# Patient Record
Sex: Female | Born: 1955 | Race: Black or African American | Hispanic: No | Marital: Single | Smoking: Former smoker
Health system: Southern US, Community
[De-identification: ages and names within clinical notes are randomized; demographics above are authoritative.]

## PROBLEM LIST (undated history)

## (undated) ENCOUNTER — Emergency Department (HOSPITAL_BASED_OUTPATIENT_CLINIC_OR_DEPARTMENT_OTHER): Payer: Medicare Other

## (undated) ENCOUNTER — Emergency Department (HOSPITAL_COMMUNITY): Admission: EM | Payer: Medicare Other | Source: Home / Self Care

## (undated) DIAGNOSIS — Z951 Presence of aortocoronary bypass graft: Secondary | ICD-10-CM

## (undated) DIAGNOSIS — E7800 Pure hypercholesterolemia, unspecified: Secondary | ICD-10-CM

## (undated) DIAGNOSIS — I2510 Atherosclerotic heart disease of native coronary artery without angina pectoris: Secondary | ICD-10-CM

## (undated) DIAGNOSIS — I509 Heart failure, unspecified: Secondary | ICD-10-CM

## (undated) DIAGNOSIS — F1020 Alcohol dependence, uncomplicated: Secondary | ICD-10-CM

## (undated) DIAGNOSIS — I2089 Other forms of angina pectoris: Secondary | ICD-10-CM

## (undated) DIAGNOSIS — C801 Malignant (primary) neoplasm, unspecified: Secondary | ICD-10-CM

## (undated) DIAGNOSIS — I208 Other forms of angina pectoris: Secondary | ICD-10-CM

## (undated) DIAGNOSIS — I739 Peripheral vascular disease, unspecified: Secondary | ICD-10-CM

## (undated) DIAGNOSIS — T7840XA Allergy, unspecified, initial encounter: Secondary | ICD-10-CM

## (undated) DIAGNOSIS — F32A Depression, unspecified: Secondary | ICD-10-CM

## (undated) DIAGNOSIS — I6930 Unspecified sequelae of cerebral infarction: Secondary | ICD-10-CM

## (undated) DIAGNOSIS — D649 Anemia, unspecified: Secondary | ICD-10-CM

## (undated) DIAGNOSIS — C50912 Malignant neoplasm of unspecified site of left female breast: Secondary | ICD-10-CM

## (undated) DIAGNOSIS — T4145XA Adverse effect of unspecified anesthetic, initial encounter: Secondary | ICD-10-CM

## (undated) DIAGNOSIS — Z8619 Personal history of other infectious and parasitic diseases: Secondary | ICD-10-CM

## (undated) DIAGNOSIS — I10 Essential (primary) hypertension: Secondary | ICD-10-CM

## (undated) DIAGNOSIS — F419 Anxiety disorder, unspecified: Secondary | ICD-10-CM

## (undated) DIAGNOSIS — E0590 Thyrotoxicosis, unspecified without thyrotoxic crisis or storm: Secondary | ICD-10-CM

## (undated) DIAGNOSIS — Z801 Family history of malignant neoplasm of trachea, bronchus and lung: Secondary | ICD-10-CM

## (undated) DIAGNOSIS — I340 Nonrheumatic mitral (valve) insufficiency: Secondary | ICD-10-CM

## (undated) DIAGNOSIS — R112 Nausea with vomiting, unspecified: Secondary | ICD-10-CM

## (undated) DIAGNOSIS — E785 Hyperlipidemia, unspecified: Secondary | ICD-10-CM

## (undated) DIAGNOSIS — I6529 Occlusion and stenosis of unspecified carotid artery: Secondary | ICD-10-CM

## (undated) DIAGNOSIS — F329 Major depressive disorder, single episode, unspecified: Secondary | ICD-10-CM

## (undated) DIAGNOSIS — I639 Cerebral infarction, unspecified: Secondary | ICD-10-CM

## (undated) DIAGNOSIS — Z9889 Other specified postprocedural states: Secondary | ICD-10-CM

## (undated) DIAGNOSIS — T8859XA Other complications of anesthesia, initial encounter: Secondary | ICD-10-CM

## (undated) DIAGNOSIS — Z9289 Personal history of other medical treatment: Secondary | ICD-10-CM

## (undated) HISTORY — PX: BREAST LUMPECTOMY: SHX2

## (undated) HISTORY — DX: Depression, unspecified: F32.A

## (undated) HISTORY — DX: Peripheral vascular disease, unspecified: I73.9

## (undated) HISTORY — PX: ABDOMINAL HYSTERECTOMY: SHX81

## (undated) HISTORY — PX: CORONARY ARTERY BYPASS GRAFT: SHX141

## (undated) HISTORY — DX: Major depressive disorder, single episode, unspecified: F32.9

## (undated) HISTORY — DX: Thyrotoxicosis, unspecified without thyrotoxic crisis or storm: E05.90

## (undated) HISTORY — DX: Personal history of other medical treatment: Z92.89

## (undated) HISTORY — DX: Atherosclerotic heart disease of native coronary artery without angina pectoris: I25.10

## (undated) HISTORY — DX: Allergy, unspecified, initial encounter: T78.40XA

## (undated) HISTORY — DX: Occlusion and stenosis of unspecified carotid artery: I65.29

## (undated) HISTORY — DX: Family history of malignant neoplasm of trachea, bronchus and lung: Z80.1

## (undated) HISTORY — PX: CARDIAC CATHETERIZATION: SHX172

## (undated) HISTORY — PX: CAROTID ENDARTERECTOMY: SUR193

## (undated) HISTORY — DX: Alcohol dependence, uncomplicated: F10.20

## (undated) HISTORY — DX: Anemia, unspecified: D64.9

## (undated) HISTORY — DX: Essential (primary) hypertension: I10

## (undated) HISTORY — DX: Pure hypercholesterolemia, unspecified: E78.00

---
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
## 1996-09-11 HISTORY — PX: ABDOMINAL HYSTERECTOMY: SHX81

## 1997-09-11 HISTORY — PX: BREAST LUMPECTOMY WITH AXILLARY LYMPH NODE DISSECTION: SHX5756

## 1997-09-11 HISTORY — PX: BREAST LUMPECTOMY: SHX2

## 1998-02-11 ENCOUNTER — Ambulatory Visit (HOSPITAL_COMMUNITY): Admission: RE | Admit: 1998-02-11 | Discharge: 1998-02-11 | Payer: Self-pay | Admitting: Family Medicine

## 1998-03-26 ENCOUNTER — Emergency Department (HOSPITAL_COMMUNITY): Admission: EM | Admit: 1998-03-26 | Discharge: 1998-03-26 | Payer: Self-pay | Admitting: Endocrinology

## 1998-08-27 ENCOUNTER — Ambulatory Visit (HOSPITAL_COMMUNITY): Admission: RE | Admit: 1998-08-27 | Discharge: 1998-08-27 | Payer: Self-pay | Admitting: General Surgery

## 1998-09-08 ENCOUNTER — Ambulatory Visit (HOSPITAL_COMMUNITY): Admission: RE | Admit: 1998-09-08 | Discharge: 1998-09-09 | Payer: Self-pay | Admitting: General Surgery

## 1998-09-08 ENCOUNTER — Encounter: Payer: Self-pay | Admitting: General Surgery

## 1998-09-13 ENCOUNTER — Encounter: Admission: RE | Admit: 1998-09-13 | Discharge: 1998-12-12 | Payer: Self-pay | Admitting: Radiation Oncology

## 1998-10-07 ENCOUNTER — Encounter: Payer: Self-pay | Admitting: Oncology

## 1998-10-07 ENCOUNTER — Ambulatory Visit (HOSPITAL_COMMUNITY): Admission: RE | Admit: 1998-10-07 | Discharge: 1998-10-07 | Payer: Self-pay | Admitting: Oncology

## 1999-01-11 ENCOUNTER — Encounter: Admission: RE | Admit: 1999-01-11 | Discharge: 1999-04-11 | Payer: Self-pay | Admitting: Radiation Oncology

## 1999-04-04 ENCOUNTER — Emergency Department (HOSPITAL_COMMUNITY): Admission: EM | Admit: 1999-04-04 | Discharge: 1999-04-04 | Payer: Self-pay | Admitting: Emergency Medicine

## 1999-04-04 ENCOUNTER — Encounter: Payer: Self-pay | Admitting: Emergency Medicine

## 1999-05-10 ENCOUNTER — Other Ambulatory Visit: Admission: RE | Admit: 1999-05-10 | Discharge: 1999-05-10 | Payer: Self-pay | Admitting: Family Medicine

## 1999-09-12 DIAGNOSIS — I2510 Atherosclerotic heart disease of native coronary artery without angina pectoris: Secondary | ICD-10-CM

## 1999-09-12 DIAGNOSIS — Z8619 Personal history of other infectious and parasitic diseases: Secondary | ICD-10-CM

## 1999-09-12 HISTORY — PX: CORONARY ARTERY BYPASS GRAFT: SHX141

## 1999-09-12 HISTORY — DX: Personal history of other infectious and parasitic diseases: Z86.19

## 1999-09-12 HISTORY — DX: Atherosclerotic heart disease of native coronary artery without angina pectoris: I25.10

## 2000-04-11 HISTORY — PX: CAROTID ENDARTERECTOMY: SUR193

## 2000-04-23 ENCOUNTER — Encounter: Payer: Self-pay | Admitting: Emergency Medicine

## 2000-04-23 ENCOUNTER — Inpatient Hospital Stay (HOSPITAL_COMMUNITY): Admission: EM | Admit: 2000-04-23 | Discharge: 2000-04-29 | Payer: Self-pay | Admitting: Emergency Medicine

## 2000-04-24 ENCOUNTER — Encounter: Payer: Self-pay | Admitting: *Deleted

## 2000-04-27 ENCOUNTER — Encounter (INDEPENDENT_AMBULATORY_CARE_PROVIDER_SITE_OTHER): Payer: Self-pay | Admitting: Specialist

## 2000-06-06 ENCOUNTER — Encounter (INDEPENDENT_AMBULATORY_CARE_PROVIDER_SITE_OTHER): Payer: Self-pay | Admitting: *Deleted

## 2000-06-06 ENCOUNTER — Ambulatory Visit (HOSPITAL_COMMUNITY): Admission: RE | Admit: 2000-06-06 | Discharge: 2000-06-06 | Payer: Self-pay | Admitting: Gastroenterology

## 2000-06-11 ENCOUNTER — Encounter: Payer: Self-pay | Admitting: Cardiothoracic Surgery

## 2000-06-11 HISTORY — PX: CARDIAC CATHETERIZATION: SHX172

## 2000-06-12 ENCOUNTER — Encounter: Payer: Self-pay | Admitting: Cardiothoracic Surgery

## 2000-06-12 ENCOUNTER — Inpatient Hospital Stay (HOSPITAL_COMMUNITY): Admission: RE | Admit: 2000-06-12 | Discharge: 2000-06-17 | Payer: Self-pay | Admitting: Cardiology

## 2000-06-13 ENCOUNTER — Encounter: Payer: Self-pay | Admitting: Cardiothoracic Surgery

## 2000-06-14 ENCOUNTER — Encounter: Payer: Self-pay | Admitting: Cardiothoracic Surgery

## 2000-07-24 ENCOUNTER — Encounter (HOSPITAL_COMMUNITY): Admission: RE | Admit: 2000-07-24 | Discharge: 2000-10-22 | Payer: Self-pay | Admitting: Cardiology

## 2000-08-30 ENCOUNTER — Encounter: Admission: RE | Admit: 2000-08-30 | Discharge: 2000-08-30 | Payer: Self-pay | Admitting: Gastroenterology

## 2000-08-30 ENCOUNTER — Encounter: Payer: Self-pay | Admitting: Gastroenterology

## 2000-09-11 DIAGNOSIS — I639 Cerebral infarction, unspecified: Secondary | ICD-10-CM

## 2000-09-11 HISTORY — DX: Cerebral infarction, unspecified: I63.9

## 2000-10-23 ENCOUNTER — Encounter (HOSPITAL_COMMUNITY): Admission: RE | Admit: 2000-10-23 | Discharge: 2000-10-28 | Payer: Self-pay | Admitting: Cardiology

## 2001-03-07 ENCOUNTER — Other Ambulatory Visit: Admission: RE | Admit: 2001-03-07 | Discharge: 2001-03-07 | Payer: Self-pay | Admitting: Obstetrics and Gynecology

## 2002-01-03 ENCOUNTER — Encounter: Payer: Self-pay | Admitting: Family Medicine

## 2002-01-03 ENCOUNTER — Encounter: Admission: RE | Admit: 2002-01-03 | Discharge: 2002-01-03 | Payer: Self-pay | Admitting: Family Medicine

## 2002-01-12 ENCOUNTER — Emergency Department (HOSPITAL_COMMUNITY): Admission: EM | Admit: 2002-01-12 | Discharge: 2002-01-13 | Payer: Self-pay | Admitting: Emergency Medicine

## 2002-01-12 ENCOUNTER — Encounter: Payer: Self-pay | Admitting: Emergency Medicine

## 2002-02-06 ENCOUNTER — Ambulatory Visit (HOSPITAL_COMMUNITY): Admission: RE | Admit: 2002-02-06 | Discharge: 2002-02-06 | Payer: Self-pay | Admitting: Neurology

## 2002-07-30 ENCOUNTER — Other Ambulatory Visit: Admission: RE | Admit: 2002-07-30 | Discharge: 2002-07-30 | Payer: Self-pay | Admitting: Family Medicine

## 2003-11-28 ENCOUNTER — Emergency Department (HOSPITAL_COMMUNITY): Admission: EM | Admit: 2003-11-28 | Discharge: 2003-11-29 | Payer: Self-pay | Admitting: Emergency Medicine

## 2004-06-23 ENCOUNTER — Encounter: Admission: RE | Admit: 2004-06-23 | Discharge: 2004-06-23 | Payer: Self-pay | Admitting: Cardiology

## 2004-09-28 ENCOUNTER — Ambulatory Visit: Payer: Self-pay | Admitting: Cardiology

## 2004-12-29 ENCOUNTER — Ambulatory Visit: Payer: Self-pay | Admitting: Oncology

## 2005-06-26 ENCOUNTER — Encounter (INDEPENDENT_AMBULATORY_CARE_PROVIDER_SITE_OTHER): Payer: Self-pay | Admitting: *Deleted

## 2005-06-26 ENCOUNTER — Encounter: Admission: RE | Admit: 2005-06-26 | Discharge: 2005-06-26 | Payer: Self-pay | Admitting: General Surgery

## 2005-12-16 ENCOUNTER — Emergency Department (HOSPITAL_COMMUNITY): Admission: EM | Admit: 2005-12-16 | Discharge: 2005-12-16 | Payer: Self-pay | Admitting: Emergency Medicine

## 2005-12-28 ENCOUNTER — Ambulatory Visit: Payer: Self-pay | Admitting: Oncology

## 2006-05-09 ENCOUNTER — Ambulatory Visit: Payer: Self-pay | Admitting: Cardiology

## 2006-05-30 ENCOUNTER — Ambulatory Visit: Payer: Self-pay

## 2006-05-30 ENCOUNTER — Encounter: Payer: Self-pay | Admitting: Cardiology

## 2006-06-14 ENCOUNTER — Ambulatory Visit: Payer: Self-pay | Admitting: Cardiology

## 2006-06-28 ENCOUNTER — Ambulatory Visit: Payer: Self-pay | Admitting: Cardiology

## 2006-06-28 ENCOUNTER — Ambulatory Visit: Payer: Self-pay

## 2007-01-29 ENCOUNTER — Ambulatory Visit: Payer: Self-pay | Admitting: Oncology

## 2007-06-19 ENCOUNTER — Ambulatory Visit: Payer: Self-pay

## 2007-08-06 ENCOUNTER — Ambulatory Visit: Payer: Self-pay | Admitting: Cardiology

## 2008-01-24 ENCOUNTER — Ambulatory Visit: Payer: Self-pay | Admitting: Oncology

## 2008-03-10 ENCOUNTER — Ambulatory Visit: Payer: Self-pay | Admitting: Oncology

## 2008-07-02 ENCOUNTER — Ambulatory Visit: Payer: Self-pay | Admitting: Cardiovascular Disease

## 2008-08-05 ENCOUNTER — Ambulatory Visit: Payer: Self-pay | Admitting: Cardiology

## 2008-08-20 ENCOUNTER — Ambulatory Visit: Payer: Self-pay | Admitting: Cardiology

## 2008-08-20 LAB — CONVERTED CEMR LAB
BUN: 13 mg/dL (ref 6–23)
CO2: 27 meq/L (ref 19–32)
Calcium: 9.2 mg/dL (ref 8.4–10.5)
Chloride: 105 meq/L (ref 96–112)
Creatinine, Ser: 0.6 mg/dL (ref 0.4–1.2)
GFR calc Af Amer: 134 mL/min
GFR calc non Af Amer: 111 mL/min
Glucose, Bld: 83 mg/dL (ref 70–99)
Potassium: 3.8 meq/L (ref 3.5–5.1)
Sodium: 138 meq/L (ref 135–145)

## 2008-09-18 ENCOUNTER — Ambulatory Visit: Payer: Self-pay | Admitting: Cardiology

## 2008-10-05 ENCOUNTER — Ambulatory Visit: Payer: Self-pay | Admitting: Cardiology

## 2008-10-05 LAB — CONVERTED CEMR LAB
ALT: 20 units/L (ref 0–35)
AST: 23 units/L (ref 0–37)
Albumin: 4 g/dL (ref 3.5–5.2)
Alkaline Phosphatase: 58 units/L (ref 39–117)
BUN: 17 mg/dL (ref 6–23)
Bilirubin, Direct: 0.1 mg/dL (ref 0.0–0.3)
CO2: 29 meq/L (ref 19–32)
Calcium: 9 mg/dL (ref 8.4–10.5)
Chloride: 103 meq/L (ref 96–112)
Cholesterol: 205 mg/dL (ref 0–200)
Creatinine, Ser: 0.7 mg/dL (ref 0.4–1.2)
Direct LDL: 141 mg/dL
GFR calc Af Amer: 112 mL/min
GFR calc non Af Amer: 93 mL/min
Glucose, Bld: 99 mg/dL (ref 70–99)
HDL: 47.2 mg/dL (ref >39.0)
Potassium: 3.8 meq/L (ref 3.5–5.1)
Sodium: 138 meq/L (ref 135–145)
Total Bilirubin: 1.1 mg/dL (ref 0.3–1.2)
Total CHOL/HDL Ratio: 4.3
Total Protein: 7.6 g/dL (ref 6.0–8.3)
Triglycerides: 51 mg/dL (ref 0–149)
VLDL: 10 mg/dL (ref 0–40)

## 2008-11-03 ENCOUNTER — Ambulatory Visit: Payer: Self-pay | Admitting: Cardiology

## 2008-12-29 DIAGNOSIS — I2510 Atherosclerotic heart disease of native coronary artery without angina pectoris: Secondary | ICD-10-CM | POA: Insufficient documentation

## 2008-12-29 DIAGNOSIS — E0590 Thyrotoxicosis, unspecified without thyrotoxic crisis or storm: Secondary | ICD-10-CM | POA: Insufficient documentation

## 2008-12-29 DIAGNOSIS — E7800 Pure hypercholesterolemia, unspecified: Secondary | ICD-10-CM | POA: Insufficient documentation

## 2008-12-29 DIAGNOSIS — F419 Anxiety disorder, unspecified: Secondary | ICD-10-CM | POA: Insufficient documentation

## 2008-12-29 DIAGNOSIS — I10 Essential (primary) hypertension: Secondary | ICD-10-CM | POA: Insufficient documentation

## 2008-12-29 DIAGNOSIS — I6529 Occlusion and stenosis of unspecified carotid artery: Secondary | ICD-10-CM | POA: Insufficient documentation

## 2008-12-30 ENCOUNTER — Encounter: Payer: Self-pay | Admitting: Cardiology

## 2008-12-30 ENCOUNTER — Ambulatory Visit: Payer: Self-pay | Admitting: Cardiology

## 2009-01-11 ENCOUNTER — Encounter: Payer: Self-pay | Admitting: Cardiology

## 2009-03-09 ENCOUNTER — Ambulatory Visit: Payer: Self-pay | Admitting: Oncology

## 2009-05-12 ENCOUNTER — Encounter: Payer: Self-pay | Admitting: Cardiology

## 2009-05-13 ENCOUNTER — Telehealth: Payer: Self-pay | Admitting: Cardiology

## 2009-05-13 ENCOUNTER — Ambulatory Visit: Payer: Self-pay | Admitting: Oncology

## 2009-05-18 ENCOUNTER — Encounter: Payer: Self-pay | Admitting: Cardiology

## 2009-06-15 ENCOUNTER — Ambulatory Visit: Payer: Self-pay | Admitting: Cardiology

## 2009-06-24 ENCOUNTER — Ambulatory Visit: Payer: Self-pay

## 2009-06-24 ENCOUNTER — Encounter: Payer: Self-pay | Admitting: Cardiology

## 2009-07-01 ENCOUNTER — Ambulatory Visit: Payer: Self-pay | Admitting: Cardiology

## 2009-07-01 ENCOUNTER — Ambulatory Visit: Payer: Self-pay

## 2009-07-01 ENCOUNTER — Encounter: Payer: Self-pay | Admitting: Cardiology

## 2009-07-01 ENCOUNTER — Ambulatory Visit (HOSPITAL_COMMUNITY): Admission: RE | Admit: 2009-07-01 | Discharge: 2009-07-01 | Payer: Self-pay | Admitting: Cardiology

## 2009-07-05 ENCOUNTER — Encounter: Payer: Self-pay | Admitting: Cardiology

## 2009-07-05 LAB — CONVERTED CEMR LAB
ALT: 27 units/L (ref 0–35)
AST: 33 units/L (ref 0–37)
Albumin: 4.4 g/dL (ref 3.5–5.2)
Alkaline Phosphatase: 50 units/L (ref 39–117)
BUN: 18 mg/dL (ref 6–23)
BUN: 13 mg/dL (ref 6–23)
Bilirubin, Direct: 0 mg/dL (ref 0.0–0.3)
CO2: 30 meq/L (ref 19–32)
CO2: 31 meq/L (ref 19–32)
Calcium: 9.9 mg/dL (ref 8.4–10.5)
Calcium: 10.2 mg/dL (ref 8.4–10.5)
Chloride: 103 meq/L (ref 96–112)
Chloride: 102 meq/L (ref 96–112)
Cholesterol: 213 mg/dL — ABNORMAL HIGH (ref 0–200)
Creatinine, Ser: 0.8 mg/dL (ref 0.4–1.2)
Creatinine, Ser: 0.7 mg/dL (ref 0.4–1.2)
Direct LDL: 161.7 mg/dL
GFR calc non Af Amer: 95.62 mL/min (ref >60)
GFR calc non Af Amer: 111.54 mL/min (ref >60)
Glucose, Bld: 89 mg/dL (ref 70–99)
Glucose, Bld: 102 mg/dL — ABNORMAL HIGH (ref 70–99)
HDL: 47.5 mg/dL (ref >39.00)
Potassium: 4 meq/L (ref 3.5–5.1)
Potassium: 4.3 meq/L (ref 3.5–5.1)
Sodium: 138 meq/L (ref 135–145)
Sodium: 141 meq/L (ref 135–145)
Total Bilirubin: 0.8 mg/dL (ref 0.3–1.2)
Total CHOL/HDL Ratio: 4
Total Protein: 8 g/dL (ref 6.0–8.3)
Triglycerides: 82 mg/dL (ref 0.0–149.0)
VLDL: 16.4 mg/dL (ref 0.0–40.0)

## 2009-08-11 ENCOUNTER — Ambulatory Visit: Payer: Self-pay | Admitting: Cardiology

## 2010-03-04 ENCOUNTER — Ambulatory Visit: Payer: Self-pay | Admitting: Cardiology

## 2010-03-07 LAB — CONVERTED CEMR LAB
BUN: 11 mg/dL (ref 6–23)
CO2: 20 meq/L (ref 19–32)
Calcium: 9.2 mg/dL (ref 8.4–10.5)
Chloride: 104 meq/L (ref 96–112)
Creatinine, Ser: 0.66 mg/dL (ref 0.40–1.20)
Glucose, Bld: 57 mg/dL — ABNORMAL LOW (ref 70–99)
Potassium: 3.5 meq/L (ref 3.5–5.3)
Sodium: 139 meq/L (ref 135–145)

## 2010-05-12 ENCOUNTER — Ambulatory Visit: Payer: Self-pay | Admitting: Oncology

## 2010-05-12 ENCOUNTER — Encounter: Payer: Self-pay | Admitting: Cardiology

## 2010-05-30 ENCOUNTER — Encounter (INDEPENDENT_AMBULATORY_CARE_PROVIDER_SITE_OTHER): Payer: Self-pay | Admitting: *Deleted

## 2010-06-13 ENCOUNTER — Ambulatory Visit: Payer: Self-pay | Admitting: Oncology

## 2010-06-21 ENCOUNTER — Ambulatory Visit: Payer: Self-pay | Admitting: Cardiology

## 2010-06-29 ENCOUNTER — Telehealth: Payer: Self-pay | Admitting: Cardiology

## 2010-08-01 ENCOUNTER — Ambulatory Visit: Payer: Self-pay

## 2010-08-01 ENCOUNTER — Encounter: Payer: Self-pay | Admitting: Cardiology

## 2010-08-12 ENCOUNTER — Telehealth: Payer: Self-pay | Admitting: Cardiology

## 2010-08-26 ENCOUNTER — Ambulatory Visit: Payer: Self-pay | Admitting: Cardiology

## 2010-09-09 ENCOUNTER — Ambulatory Visit: Payer: Self-pay | Admitting: Cardiology

## 2010-09-19 ENCOUNTER — Ambulatory Visit (HOSPITAL_COMMUNITY)
Admission: RE | Admit: 2010-09-19 | Discharge: 2010-09-19 | Payer: Self-pay | Source: Home / Self Care | Attending: Gastroenterology | Admitting: Gastroenterology

## 2010-09-22 ENCOUNTER — Encounter: Payer: Self-pay | Admitting: Cardiology

## 2010-09-22 ENCOUNTER — Ambulatory Visit: Admission: RE | Admit: 2010-09-22 | Discharge: 2010-09-22 | Payer: Self-pay | Source: Home / Self Care

## 2010-09-26 ENCOUNTER — Telehealth: Payer: Self-pay | Admitting: Cardiology

## 2010-09-28 ENCOUNTER — Telehealth (INDEPENDENT_AMBULATORY_CARE_PROVIDER_SITE_OTHER): Payer: Self-pay | Admitting: *Deleted

## 2010-10-05 ENCOUNTER — Other Ambulatory Visit: Payer: Self-pay | Admitting: Cardiology

## 2010-10-05 LAB — BASIC METABOLIC PANEL
BUN: 14 mg/dL (ref 6–23)
CO2: 29 mEq/L (ref 19–32)
Calcium: 9.2 mg/dL (ref 8.4–10.5)
Chloride: 103 mEq/L (ref 96–112)
Creatinine, Ser: 0.8 mg/dL (ref 0.4–1.2)
GFR: 100.97 mL/min (ref >60.00)
Glucose, Bld: 89 mg/dL (ref 70–99)
Potassium: 3.6 mEq/L (ref 3.5–5.1)
Sodium: 141 mEq/L (ref 135–145)

## 2010-10-13 NOTE — Assessment & Plan Note (Signed)
Summary: 2 MONTH ROV   Primary Provider:  Ursula Beath   History of Present Illness: Pt has been feeling ok.  She has been taking Lexapro and anxiety medications. She has been keeping her granddaughter.  She has her two or three days per week.  Denies chest pain, despite working out.    Current Medications (verified): 1)  Aspirin 81 Mg Tbec (Aspirin) .... Take One Tablet By Mouth Daily 2)  Enalapril-Hydrochlorothiazide 5-12.5 Mg Tabs (Enalapril-Hydrochlorothiazide) .... Take One Tablet By Mouth Twice A Day 3)  Lexapro 10 Mg Tabs (Escitalopram Oxalate) .... Take 1 Tablet By Mouth Once A Day- 4)  Nitroglycerin 0.4 Mg Subl (Nitroglycerin) .... One Tablet Under Tongue Every 5 Minutes As Needed For Chest Pain---May Repeat Times Three 5)  Crestor 10 Mg Tabs (Rosuvastatin Calcium) .... Take One Tablet By Mouth Daily. 6)  Clonazepam Odt 0.5 Mg Tbdp (Clonazepam) .... As Needed 7)  One Daily  Tabs (Multiple Vitamin) .... Take 1 Tablet By Mouth Once A Day 8)  Metoprolol Succinate 50 Mg Xr24h-Tab (Metoprolol Succinate) .... Take One Tablet By Mouth Daily  Allergies (verified): No Known Drug Allergies  Vital Signs:  Patient profile:   55 year old female Height:      64 inches Weight:      167 pounds BMI:     28.77 Pulse rate:   73 / minute BP sitting:   142 / 84  (left arm) Cuff size:   regular  Vitals Entered By: Hardin Negus, RMA (August 11, 2009 1:39 PM)  Physical Exam  General:  Well developed, well nourished, in no acute distress. Neck:  Carotid scar.  No bruit Lungs:  Clear bilaterally to auscultation and percussion. Heart:  PMI non displaced.  Normal S1 and S2.  Prominent S4 gallop. Abdomen:  Bowel sounds positive; abdomen soft and non-tender without masses, organomegaly, or hernias noted. No hepatosplenomegaly. Extremities:  No clubbing or cyanosis. Neurologic:  Alert and oriented x 3.   EKG  Procedure date:  08/11/2009  Findings:      LVH with repolarization  abnormality  Impression & Recommendations:  Problem # 1:  CAD (ICD-414.00) stable.  Not having any issues at present. Her updated medication list for this problem includes:    Aspirin 81 Mg Tbec (Aspirin) .Marland Kitchen... Take one tablet by mouth daily    Enalapril-hydrochlorothiazide 5-12.5 Mg Tabs (Enalapril-hydrochlorothiazide) .Marland Kitchen... Take one tablet by mouth twice a day    Nitroglycerin 0.4 Mg Subl (Nitroglycerin) ..... One tablet under tongue every 5 minutes as needed for chest pain---may repeat times three    Metoprolol Succinate 50 Mg Xr24h-tab (Metoprolol succinate) .Marland Kitchen... Take one tablet by mouth daily  Orders: EKG w/ Interpretation (93000)  Problem # 2:  HYPERCHOLESTEROLEMIA (ICD-272.0) Stable. Follow up with Dr. Evlyn Kanner. Her updated medication list for this problem includes:    Crestor 10 Mg Tabs (Rosuvastatin calcium) .Marland Kitchen... Take one tablet by mouth daily.  Orders: EKG w/ Interpretation (93000)  Problem # 3:  HYPERTENSION (ICD-401.9) Stable, question of meds in past.  BP better although higher this am..   ECG consistent with LVH. Her updated medication list for this problem includes:    Aspirin 81 Mg Tbec (Aspirin) .Marland Kitchen... Take one tablet by mouth daily    Enalapril-hydrochlorothiazide 5-12.5 Mg Tabs (Enalapril-hydrochlorothiazide) .Marland Kitchen... Take one tablet by mouth twice a day    Metoprolol Succinate 50 Mg Xr24h-tab (Metoprolol succinate) .Marland Kitchen... Take one tablet by mouth daily  Orders: EKG w/ Interpretation (93000)  Problem # 4:  CAROTID ARTERY STENOSIS (ICD-433.10) Recent studies done.  Excellent pulses in l hand. Her updated medication list for this problem includes:    Aspirin 81 Mg Tbec (Aspirin) .Marland Kitchen... Take one tablet by mouth daily  Patient Instructions: 1)  Your physician recommends that you continue on your current medications as directed. Please refer to the Current Medication list given to you today. 2)  Your physician wants you to follow-up in:   4 MONTHS. You will receive a  reminder letter in the mail two months in advance. If you don't receive a letter, please call our office to schedule the follow-up appointment.

## 2010-10-13 NOTE — Miscellaneous (Signed)
Summary: Orders Update  Clinical Lists Changes  Medications: Added new medication of METOPROLOL SUCCINATE 50 MG XR24H-TAB (METOPROLOL SUCCINATE) Take one tablet by mouth daily - Signed Rx of METOPROLOL SUCCINATE 50 MG XR24H-TAB (METOPROLOL SUCCINATE) Take one tablet by mouth daily;  #30 x 10;  Signed;  Entered by: Ledon Snare, RN;  Authorized by: Ronaldo Miyamoto, MD, [facility];  Method used: Electronically to [facility] [address], [place], [place], [place]  [address], Ph: [phone], Fax: [phone] Orders: Added new Test order of Carotid Duplex (Carotid Duplex) - Signed Added new Test order of TLB-BMP (Basic Metabolic Panel-BMET) (80048-METABOL) - Signed    Prescriptions: METOPROLOL SUCCINATE 50 MG XR24H-TAB (METOPROLOL SUCCINATE) Take one tablet by mouth daily  #30 x 10   Entered by:   Ledon Snare, RN   Authorized by:   Ronaldo Miyamoto, MD, [facility]   Signed by:   Ledon Snare, RN on 07/01/2009   Method used:   Electronically to        [facility] [address] (retail)       [address]       [place], [place]  [address]       Ph: [phone]       Fax: [phone]   RxID:   [id number]

## 2010-10-13 NOTE — Assessment & Plan Note (Signed)
Summary: ROV   Visit Type:  rov Primary Provider:  Ursula Beath  CC:  no cardiac complaints today.  History of Present Illness: SHe is doing well.  Her daughter is doing well.  Allowing her daughter to handle her own problems.   NO chest pain.  Does often not take evening medication.  Current Medications (verified): 1)  Aspirin 81 Mg Tbec (Aspirin) .... Take One Tablet By Mouth Daily 2)  Enalapril-Hydrochlorothiazide 5-12.5 Mg Tabs (Enalapril-Hydrochlorothiazide) .... Take One Tablet By Mouth Twice A Day 3)  Lexapro 10 Mg Tabs (Escitalopram Oxalate) .... Take 1 Tablet By Mouth Once A Day- 4)  Nitroglycerin 0.4 Mg Subl (Nitroglycerin) .... One Tablet Under Tongue Every 5 Minutes As Needed For Chest Pain---May Repeat Times Three 5)  Crestor 10 Mg Tabs (Rosuvastatin Calcium) .... Take One Tablet By Mouth Daily. 6)  Clonazepam Odt 0.5 Mg Tbdp (Clonazepam) .... As Needed 7)  One Daily  Tabs (Multiple Vitamin) .... Take 1 Tablet By Mouth Once A Day 8)  Metoprolol Succinate 50 Mg Xr24h-Tab (Metoprolol Succinate) .... Take One Tablet By Mouth Daily  Allergies (verified): No Known Drug Allergies  Vital Signs:  Patient profile:   55 year old female Height:      64 inches Weight:      172 pounds BMI:     29.63 Pulse rate:   76 / minute Pulse rhythm:   irregular BP sitting:   160 / 90  (right arm) Cuff size:   large  Vitals Entered By: Danielle Rankin, CMA (March 04, 2010 3:29 PM)  Physical Exam  General:  Well developed, well nourished, in no acute distress. Head:  normocephalic and atraumatic Eyes:  PERRLA/EOM intact; conjunctiva and lids normal. Lungs:  Clear bilaterally to auscultation and percussion. Heart:  PMI non displaced. Normal S1 and S2.  S4.  No murmur. Pulses:  pulses normal in all 4 extremities Extremities:  No clubbing or cyanosis. Neurologic:  Alert and oriented x 3.   EKG  Procedure date:  03/04/2010  Findings:      NSR.  LVH with repole changes.    Impression & Recommendations:  Problem # 1:  CAD (ICD-414.00)  stable and doing well.  No chest pain.  Her updated medication list for this problem includes:    Aspirin 81 Mg Tbec (Aspirin) .Marland Kitchen... Take one tablet by mouth daily    Lisinopril 20 Mg Tabs (Lisinopril) .Marland Kitchen... Take one tablet by mouth daily    Nitroglycerin 0.4 Mg Subl (Nitroglycerin) ..... One tablet under tongue every 5 minutes as needed for chest pain---may repeat times three    Metoprolol Succinate 50 Mg Xr24h-tab (Metoprolol succinate) .Marland Kitchen... Take one tablet by mouth daily  Her updated medication list for this problem includes:    Aspirin 81 Mg Tbec (Aspirin) .Marland Kitchen... Take one tablet by mouth daily    Enalapril-hydrochlorothiazide 5-12.5 Mg Tabs (Enalapril-hydrochlorothiazide) .Marland Kitchen... Take one tablet by mouth twice a day    Nitroglycerin 0.4 Mg Subl (Nitroglycerin) ..... One tablet under tongue every 5 minutes as needed for chest pain---may repeat times three    Metoprolol Succinate 50 Mg Xr24h-tab (Metoprolol succinate) .Marland Kitchen... Take one tablet by mouth daily  Orders: T-Basic Metabolic Panel [id number])  Problem # 2:  HYPERCHOLESTEROLEMIA (ICD-272.0) Needs lipid and liver in October. Her updated medication list for this problem includes:    Crestor 10 Mg Tabs (Rosuvastatin calcium) .Marland Kitchen... Take one tablet by mouth daily.  Problem # 3:  HYPERTENSION (ICD-401.9) not well controlled.  Does not  take evening medications. Her updated medication list for this problem includes:    Aspirin 81 Mg Tbec (Aspirin) .Marland Kitchen... Take one tablet by mouth daily    Lisinopril 20 Mg Tabs (Lisinopril) .Marland Kitchen... Take one tablet by mouth daily    Metoprolol Succinate 50 Mg Xr24h-tab (Metoprolol succinate) .Marland Kitchen... Take one tablet by mouth daily    Hydrochlorothiazide 12.5 Mg Tabs (Hydrochlorothiazide) .Marland Kitchen... Take one tablet by mouth daily.  Problem # 4:  HYPERTHYROIDISM (ICD-242.90) followed by Dr. Evlyn Kanner. Her updated medication list for this problem  includes:    Metoprolol Succinate 50 Mg Xr24h-tab (Metoprolol succinate) .Marland Kitchen... Take one tablet by mouth daily  Orders: T-Basic Metabolic Panel [id number])  Patient Instructions: 1)  Your physician recommends that you schedule a follow-up appointment in: 1 MONTH 2)  Your physician recommends that you return for lab work in: 1 WEEK (BMP 414.04, 401.9, 272.0) 3)  Your physician has recommended you make the following change in your medication: STOP  Enalapril HCTZ, START Lisinopril  20mg  once a day, START HCTZ 12.5mg  once a day Prescriptions: HYDROCHLOROTHIAZIDE 12.5 MG TABS (HYDROCHLOROTHIAZIDE) Take one tablet by mouth daily.  #30 x 3   Entered by:   Julieta Gutting, RN, BSN   Authorized by:   Ronaldo Miyamoto, MD, [facility]   Signed by:   Julieta Gutting, RN, BSN on 03/04/2010   Method used:   Electronically to        [facility] (retail)       [address]       [place], [place]  [address]       Ph: [phone]       Fax: [phone]   RxID:   [id number] LISINOPRIL 20 MG TABS (LISINOPRIL) Take one tablet by mouth daily  #30 x 3   Entered by:   Julieta Gutting, RN, BSN   Authorized by:   Ronaldo Miyamoto, MD, [facility]   Signed by:   Julieta Gutting, RN, BSN on 03/04/2010   Method used:   Electronically to        [facility] (retail)       [address]       [place], [place]  [address]       Ph: [phone]       Fax: [phone]   RxID:   [id number] CRESTOR 10 MG TABS (ROSUVASTATIN CALCIUM) Take one tablet by mouth daily.  #90 x 3   Entered by:   Danielle Rankin, CMA   Authorized by:   Ronaldo Miyamoto, MD, [facility]   Signed by:   Danielle Rankin, CMA on 03/04/2010   Method used:   Electronically to        [facility] [address] (retail)       [address]       [place], [place]  [address]       Ph: [phone]       Fax: [phone]   RxID:   [id number] ENALAPRIL-HYDROCHLOROTHIAZIDE 5-12.5  MG TABS (ENALAPRIL-HYDROCHLOROTHIAZIDE) take one tablet by mouth twice a day  #90 x 3   Entered by:   Danielle Rankin, CMA   Authorized by:   Ronaldo Miyamoto, MD, [facility]   Signed by:   Danielle Rankin, CMA on 03/04/2010   Method used:   [facility]ronically to  [facility]       [address]       [place], [place]  [address]       Ph: [phone]       Fax: [phone]   RxID:   [id number]  I called [facility] pharmacy and left a message to discontinue the order for Enalapril HCTZ. Julieta Gutting, RN, BSN  March 04, 2010 5:09 PM

## 2010-10-13 NOTE — Progress Notes (Signed)
Summary: results, need BMP  Phone Note Call from Patient   Caller: Patient [phone] Reason for Call: Talk to Nurse Summary of Call: pt has nurse questions-pls advise  Initial call taken by: Glynda Jaeger,  September 26, 2010 10:14 AM  Follow-up for Phone Call        Left message for pt to call back.  This pt needs to have BMP checked. Julieta Gutting, RN, BSN  September 26, 2010 11:13 AM  Left message for pt to call back.  Also make pt aware of renal duplex results and need for BMP.  Julieta Gutting, RN, BSN  September 29, 2010 3:21 PM  I spoke with the pt and made her aware of renal duplex results. I also made her aware that she needs to have BMP checked due to medication change.  The pt said she would call back and reschedule labs due to a bad connection on phone. The pt said her BP has been a little better since the medication change (SBP 170 consistently).  I told her that we need lab results before we can make further adjustments in her medication.  Follow-up by: Julieta Gutting, RN, BSN,  September 30, 2010 3:58 PM

## 2010-10-13 NOTE — Progress Notes (Signed)
Summary: BP Elevated  Phone Note Call from Patient   Caller: Patient Summary of Call: I spoke with the pt because she missed her appt with Dr Riley Kill on 08/10/10.  The pt has rescheduled to 08/26/10. I asked the pt is she is taking her medications as directed and she said yes.  The pt's BP remains elevated and has been running so high that the cuff pops off her arm.  Most of her BP readings have been above 180. I recommended that the pt increase HCTZ to 25mg  daily for the next week and continue monitoring her BP.  The pt agreed to call me next week about the BP readings.  Initial call taken by: Julieta Gutting, RN, BSN,  August 12, 2010 4:52 PM  Follow-up for Phone Call        This pt has a scheduled appt with Dr Riley Kill on 08/26/10. I left a message for the pt to call the office about her BP readings.  Julieta Gutting, RN, BSN  August 23, 2010 3:23 PM

## 2010-10-13 NOTE — Miscellaneous (Signed)
Summary: Orders Update  Clinical Lists Changes  Orders: Added new Test order of TLB-BMP (Basic Metabolic Panel-BMET) (80048-METABOL) - Signed 

## 2010-10-13 NOTE — Assessment & Plan Note (Signed)
Summary:  Cardiology  Medications Added LEXAPRO 10 MG TABS (ESCITALOPRAM OXALATE) Take 1 tablet by mouth once a day- Not taking right now. Can't afford PRAVASTATIN SODIUM 40 MG TABS (PRAVASTATIN SODIUM) Take 1 tablet by mouth once a day CLONAZEPAM ODT 0.5 MG TBDP (CLONAZEPAM) as needed      Allergies Added: NKDA  Visit Type:  Follow-up  CC:  No complains.  History of Present Illness: Doing better.  Doing more to take care of herself.  Daughter is twenty and baby is three.  No chest pain.  Stopped taking Lexapro because could not afford.  Patient stopped thyroid medication.  Has been retesting with stroke followup.  Short term memory loss.  Current Medications (verified): 1)  Aspirin 81 Mg Tbec (Aspirin) .... Take One Tablet By Mouth Daily 2)  Enalapril-Hydrochlorothiazide 5-12.5 Mg Tabs (Enalapril-Hydrochlorothiazide) .... Take 1 Tablet in Am, and 1/2 Tablet Pm 3)  Lexapro 10 Mg Tabs (Escitalopram Oxalate) .... Take 1 Tablet By Mouth Once A Day- Not Taking Right Now. Can't Afford 4)  Nitroglycerin 0.4 Mg Subl (Nitroglycerin) .... One Tablet Under Tongue Every 5 Minutes As Needed For Chest Pain---May Repeat Times Three 5)  Pravastatin Sodium 40 Mg Tabs (Pravastatin Sodium) .... Take 1 Tablet By Mouth Once A Day 6)  Clonazepam Odt 0.5 Mg Tbdp (Clonazepam) .... As Needed  Allergies (verified): No Known Drug Allergies  Vital Signs:  Patient profile:   55 year old female Height:      64 inches Weight:      166.75 pounds BMI:     28.73 Pulse rate:   77 / minute Pulse rhythm:   regular Resp:     18 per minute BP sitting:   134 / 90  (right arm) Cuff size:   large  Vitals Entered By: Vikki Ports (December 30, 2008 11:12 AM)  Physical Exam  General:  Well developed, well nourished, in no acute distress. Lungs:  Clear bilaterally to auscultation and percussion. Heart:  Non-displaced PMI, chest non-tender; regular rate and rhythm, S1, S2 without murmurs, rubs or gallops.  Carotid upstroke normal, no bruit. Normal abdominal aortic size, no bruits. Femorals normal pulses, no bruits. Pedals normal pulses. No edema, no varicosities.   EKG  Procedure date:  12/30/2008  Findings:      NSR.  LVH  Impression & Recommendations:  Problem # 1:  CAD (ICD-414.00) Stable Her updated medication list for this problem includes:    Aspirin 81 Mg Tbec (Aspirin) .Marland Kitchen... Take one tablet by mouth daily    Enalapril-hydrochlorothiazide 5-12.5 Mg Tabs (Enalapril-hydrochlorothiazide) .Marland Kitchen... Take 1 tablet in am, and 1/2 tablet pm    Nitroglycerin 0.4 Mg Subl (Nitroglycerin) ..... One tablet under tongue every 5 minutes as needed for chest pain---may repeat times three  Problem # 2:  HYPERCHOLESTEROLEMIA (ICD-272.0) Remains on pravastatin.  Got off track before lipid was checked and since restarted Pravastatin. The following medications were removed from the medication list:    Pravastatin Sodium 40 Mg Tabs (Pravastatin sodium) .Marland Kitchen... Take 1 tablet by mouth once a day Her updated medication list for this problem includes:    Pravastatin Sodium 40 Mg Tabs (Pravastatin sodium) .Marland Kitchen... Take 1 tablet by mouth once a day  Problem # 3:  DEPRESSION (ICD-311) Ran out of Lexapro, could not get refilled.  Problem # 4:  HYPERTHYROIDISM (ICD-242.90) Stopped her Tapazole.  Forgot to take.  Problem # 5:  CAROTID ARTERY STENOSIS (ICD-433.10) Needs followup in October of this year.  Has one lesion  40-59%.  1 year followup recommended Her updated medication list for this problem includes:    Aspirin 81 Mg Tbec (Aspirin) .Marland Kitchen... Take one tablet by mouth daily  Patient Instructions: 1)  Your physician wants you to follow-up in:   6 months. You will receive a reminder letter in the mail two months in advance. If you don't receive a letter, please call our office to schedule the follow-up appointment. 2)  Your physician recommends that you continue on your current medications as directed. Please  refer to the Current Medication list given to you today.

## 2010-10-13 NOTE — Assessment & Plan Note (Signed)
Summary: f6w   Visit Type:  Follow-up Primary Provider:  Ursula Beath  CC:  BP issues.  History of Present Illness: Patient is stable.  She exercises on a regular basis.  She feels good.  She misses medication occasionally, either running out of funds, or sometimes forgetting.  However, when she called she had been taking regularly.  There is broad variability in her pressures.   She does not have cough with the medication.   Problems Prior to Update: 1)  Cad  (ICD-414.00) 2)  Hypercholesterolemia  (ICD-272.0) 3)  Hypertension  (ICD-401.9) 4)  Hyperthyroidism  (ICD-242.90) 5)  Depression  (ICD-311) 6)  Carotid Artery Stenosis  (ICD-433.10)  Current Medications (verified): 1)  Aspirin 81 Mg Tbec (Aspirin) .... Take One Tablet By Mouth Daily 2)  Lisinopril 20 Mg Tabs (Lisinopril) .... Take One Tablet By Mouth Daily 3)  Lexapro 10 Mg Tabs (Escitalopram Oxalate) .... Take 1 1/2  Tablet By Mouth Once A Day- 4)  Nitroglycerin 0.4 Mg Subl (Nitroglycerin) .... One Tablet Under Tongue Every 5 Minutes As Needed For Chest Pain---May Repeat Times Three 5)  Crestor 10 Mg Tabs (Rosuvastatin Calcium) .... Take One Tablet By Mouth Daily. 6)  Clonazepam Odt 0.5 Mg Tbdp (Clonazepam) .... As Needed 7)  One Daily  Tabs (Multiple Vitamin) .... Take 1 Tablet By Mouth Once A Day 8)  Metoprolol Succinate 50 Mg Xr24h-Tab (Metoprolol Succinate) .... Take One Tablet By Mouth Daily 9)  Hydrochlorothiazide 12.5 Mg Tabs (Hydrochlorothiazide) .... Take  2  Tablets By Mouth Daily 10)  Methimazole 10 Mg Tabs (Methimazole) .... Take 1 Tablet By Mouth Once A Day  Allergies (verified): No Known Drug Allergies  Vital Signs:  Patient profile:   55 year old female Height:      64 inches Weight:      169.50 pounds BMI:     29.20 Pulse rate:   72 / minute Pulse rhythm:   regular Resp:     18 per minute BP sitting:   180 / 80  (right arm) Cuff size:   large  Vitals Entered By: Vikki Ports (August 26, 2010 4:07 PM)  Physical Exam  General:  Well developed, well nourished, in no acute distress. Head:  normocephalic and atraumatic Lungs:  Clear bilaterally to auscultation and percussion. Heart:  Non-displaced PMI, chest non-tender; regular rate and rhythm, S1, S2 without murmurs, rubs or gallops.  Abdomen:  No abdominal bruits noted.   Msk:  Back normal, normal gait. Muscle strength and tone normal. Pulses:  pulses normal in all 4 extremities Extremities:  No clubbing or cyanosis. Neurologic:  Alert and oriented x 3.   Impression & Recommendations:  Problem # 1:  HYPERTENSION (ICD-401.9) Difficult issue at this point.  Will increase ACE dose, and follow closely.  She is to call with BP.  Also, have talked about need to consistently take medication, not miss, costs, etc.  Will recheck BMET, and recheck again in 2 weeks.  Do renal duplex.  Check BPS and return in six weeks.  Importance of BP monitoring stressed.  She often stops when she runs out of medications.   Her updated medication list for this problem includes:    Aspirin 81 Mg Tbec (Aspirin) .Marland Kitchen... Take one tablet by mouth daily    Lisinopril 40 Mg Tabs (Lisinopril) .Marland Kitchen... Take one tablet by mouth daily    Metoprolol Succinate 50 Mg Xr24h-tab (Metoprolol succinate) .Marland Kitchen... Take one tablet by mouth daily    Hydrochlorothiazide 25  Mg Tabs (Hydrochlorothiazide) .Marland Kitchen... Take one tablet by mouth daily.  Problem # 2:  CAD (ICD-414.00)  stable Her updated medication list for this problem includes:    Aspirin 81 Mg Tbec (Aspirin) .Marland Kitchen... Take one tablet by mouth daily    Lisinopril 40 Mg Tabs (Lisinopril) .Marland Kitchen... Take one tablet by mouth daily    Nitroglycerin 0.4 Mg Subl (Nitroglycerin) ..... One tablet under tongue every 5 minutes as needed for chest pain---may repeat times three    Metoprolol Succinate 50 Mg Xr24h-tab (Metoprolol succinate) .Marland Kitchen... Take one tablet by mouth daily  Orders: Renal Artery Duplex (Renal Artery Duplex)  Her  updated medication list for this problem includes:    Aspirin 81 Mg Tbec (Aspirin) .Marland Kitchen... Take one tablet by mouth daily    Lisinopril 40 Mg Tabs (Lisinopril) .Marland Kitchen... Take one tablet by mouth daily    Nitroglycerin 0.4 Mg Subl (Nitroglycerin) ..... One tablet under tongue every 5 minutes as needed for chest pain---may repeat times three    Metoprolol Succinate 50 Mg Xr24h-tab (Metoprolol succinate) .Marland Kitchen... Take one tablet by mouth daily  Problem # 3:  HYPERCHOLESTEROLEMIA (ICD-272.0)  under treatment. Her updated medication list for this problem includes:    Crestor 10 Mg Tabs (Rosuvastatin calcium) .Marland Kitchen... Take one tablet by mouth daily.  Her updated medication list for this problem includes:    Crestor 10 Mg Tabs (Rosuvastatin calcium) .Marland Kitchen... Take one tablet by mouth daily.  Patient Instructions: 1)  Your physician has recommended you make the following change in your medication: INCREASE Lisinopril to 40mg  daily 2)  Your physician has requested that you have a renal artery duplex. During this test, an ultrasound is used to evaluate blood flow to the kidneys. Allow one hour for this exam. Do not eat after midnight the day before and avoid carbonated beverages. Take your medications as you usually do. 3)  Your physician has requested that you regularly monitor and record your blood pressure readings at home.  Please use the same machine at the same time of day to check your readings and record them to bring to your follow-up visit. 4)  Your physician recommends that you schedule a follow-up appointment in: 6 WEEKS 5)  Your physician recommends that you return for lab work in: 2 WEEKS (BMP 401.9, 414.00) Prescriptions: HYDROCHLOROTHIAZIDE 25 MG TABS (HYDROCHLOROTHIAZIDE) Take one tablet by mouth daily.  #30 x 6   Entered by:   Julieta Gutting, RN, BSN   Authorized by:   Ronaldo Miyamoto, MD, [facility]   Signed by:   Julieta Gutting, RN, BSN on 08/26/2010   Method used:   Electronically to        [facility] (retail)       [address]       [place], [place]  [address]       Ph: [phone]       Fax: [phone]   RxID:   [id number] LISINOPRIL 40 MG TABS (LISINOPRIL) Take one tablet by mouth daily  #30 x 6   Entered by:   Julieta Gutting, RN, BSN   Authorized by:   Ronaldo Miyamoto, MD, [facility]   Signed by:   Julieta Gutting, RN, BSN on 08/26/2010   Method used:   Electronically to        [facility] (retail)       [address]       [place]  [place], [place]  [address]       Ph: [phone]       Fax: [phone]   RxID:   [id number]

## 2010-10-13 NOTE — Progress Notes (Signed)
Summary: rtn call to obtain results   Phone Note Call from Patient Call back at Home Phone [phone]   Reason for Call: Talk to Nurse, Lab or Test Results Summary of Call: rtn call to obtain results Initial call taken by: Omer Jack,  May 13, 2009 9:36 AM  Follow-up for Phone Call        Spoke with pt. states it was a lost call on her phone. The only Md she has is Dr. Riley Kill. Pt states has not had tests done in the office recently.  The only test in her chart are from [facility] done in may/10. Results given. Pt understood it could have been a wrong number. Follow-up by: Ollen Gross, RN, BSN,  May 13, 2009 10:32 AM

## 2010-10-13 NOTE — Letter (Signed)
Summary: [facility]  [facility]   Imported By: Roderic Ovens 06/11/2009 14:04:18  _____________________________________________________________________  External Attachment:    Type:   Image     Comment:   External Document

## 2010-10-13 NOTE — Progress Notes (Signed)
Summary: refill request  Phone Note Refill Request Message from:  Patient on September 28, 2010 1:26 PM  Refills Requested: Medication #1:  LEXAPRO 10 MG TABS Take 1 1/2  tablet by mouth once a day- pt never had rx filled -needs a new rx called in-[facility]   Method Requested: Telephone to Pharmacy Initial call taken by: Glynda Jaeger,  September 28, 2010 1:28 PM  Follow-up for Phone Call        Medication needs to be refill per primary care Md. Patient notified. Vikki Ports  September 29, 2010 12:52 PM

## 2010-10-13 NOTE — Letter (Signed)
Summary: Appointment - Missed   [facility]  [address]   [place], [place] [address]   Phone: [phone]  Fax: [phone]     May 30, 2010 MRN: [id number]   [facility] [address] [place], [place]  [address]   Dear Ms. Switala,  Our records indicate you missed your appointment on 04/15/2010 at 09:30am with Dr. Riley Kill. It is very important that we reach you to reschedule this appointment. We look forward to participating in your health care needs. Please contact us at the number listed above at your earliest convenience to reschedule this appointment.     Sincerely, Neurosurgeon Team LG

## 2010-10-13 NOTE — Assessment & Plan Note (Signed)
Summary: ROV   Visit Type:  Follow-up Primary Provider:  Ursula Beath  CC:  No cariac complaints.  History of Present Illness: Patient is doing well.  She just left her daughter with some mess going on, and she thinks that impacted her BP.  Her daughter is in and out, but their relationship is much calmer.  She thinks she is do to go to see Dr. Evlyn Kanner to get her thyroids checked.  She did the women's 5k.  She checks her BP on occasion, and they tend to stay a little on the high side.  She thinks some of it is that she forgets to take her meds, and she feels rushed with making sense of her life.  She has not been using a box, and getting them off the shelf.  We talked about all types of tips to help her memory.  She also says her level of compliance on scale of 1 to 10 is 5.      Current Medications (verified): 1)  Aspirin 81 Mg Tbec (Aspirin) .... Take One Tablet By Mouth Daily 2)  Lisinopril 20 Mg Tabs (Lisinopril) .... Take One Tablet By Mouth Daily 3)  Lexapro 10 Mg Tabs (Escitalopram Oxalate) .... Take 1 1/2  Tablet By Mouth Once A Day- 4)  Nitroglycerin 0.4 Mg Subl (Nitroglycerin) .... One Tablet Under Tongue Every 5 Minutes As Needed For Chest Pain---May Repeat Times Three 5)  Crestor 10 Mg Tabs (Rosuvastatin Calcium) .... Take One Tablet By Mouth Daily. 6)  Clonazepam Odt 0.5 Mg Tbdp (Clonazepam) .... As Needed 7)  One Daily  Tabs (Multiple Vitamin) .... Take 1 Tablet By Mouth Once A Day 8)  Metoprolol Succinate 50 Mg Xr24h-Tab (Metoprolol Succinate) .... Take One Tablet By Mouth Daily 9)  Hydrochlorothiazide 12.5 Mg Tabs (Hydrochlorothiazide) .... Take One Tablet By Mouth Daily. 10)  Methimazole 10 Mg Tabs (Methimazole) .... Take 1 Tablet By Mouth Once A Day  Allergies (verified): No Known Drug Allergies  Past History:  Past Medical History: Last updated: 01/10/09   CAD (ICD-414.00)-- coronary artery bypass graft, 16-1096 Dr. Donata Clay. HYPERCHOLESTEROLEMIA  (ICD-272.0) HYPERTENSION (ICD-401.9) HYPERTHYROIDISM (ICD-242.90) DEPRESSION (ICD-311) CAROTID ARTERY STENOSIS (ICD-433.10)  Past Surgical History: Last updated: 01-10-09  Coronary artery bypass grafting x 5 (left internal mammary artery to left anterior descending coronary artery- 2001.  Left breast cancer treated with lumpectomy, chemotherapy and radiation in 1999.  left carotid endarterectomy, April 27, 2000.    Family History: Last updated: 2009/01/10 The patients mother died at an early age of carcinoma of the lung.  There is no history of premature coronary artery disease in her family.  Social History: Last updated: 10-Jan-2009 The patient is a IT trainer, Child psychotherapist, and received her college degree.  She is a single mother, and her daughter is 53 years old .  She quit smoking in August  2003,  and does not use alcohol significantly.  Vital Signs:  Patient profile:   55 year old female Height:      64 inches Weight:      171.50 pounds BMI:     29.54 Pulse rate:   71 / minute Pulse rhythm:   regular Resp:     18 per minute BP sitting:   180 / 100 Cuff size:   large  Vitals Entered By: Vikki Ports (June 21, 2010 2:33 PM)  Physical Exam  General:  Well developed, well nourished, in no acute distress. Head:  normocephalic and atraumatic Eyes:  PERRLA/EOM intact;  conjunctiva and lids normal. Lungs:  Clear bilaterally to auscultation and percussion. Heart:  PMI non displaced.  Normal S1 and S2.  Pos S4.  No murmur.  Pulses:  pulses normal in all 4 extremities Extremities:  No clubbing or cyanosis. Neurologic:  Alert and oriented x 3.   EKG  Procedure date:  06/21/2010  Findings:      NSR.  Biatrial enlargement.  LVH with repole changes.   Impression & Recommendations:  Problem # 1:  CAD (ICD-414.00)  No chest pain.  Stable post CABG.  Continue medication. Her updated medication list for this problem includes:    Aspirin 81 Mg Tbec (Aspirin)  .Marland Kitchen... Take one tablet by mouth daily    Lisinopril 20 Mg Tabs (Lisinopril) .Marland Kitchen... Take one tablet by mouth daily    Nitroglycerin 0.4 Mg Subl (Nitroglycerin) ..... One tablet under tongue every 5 minutes as needed for chest pain---may repeat times three    Metoprolol Succinate 50 Mg Xr24h-tab (Metoprolol succinate) .Marland Kitchen... Take one tablet by mouth daily  Orders: EKG w/ Interpretation (93[address]) Carotid Duplex (Carotid Duplex)  Problem # 2:  HYPERTENSION (ICD-401.9) poorly controlled, but likely as much compliance failure as much as medication failure. Instructed patient on tips for memory, asked her to make a list of medications, use a drug box, and placed reminders around the house.  Record daily and return to me in six weeks so we can make adjustments.   Her updated medication list for this problem includes:    Aspirin 81 Mg Tbec (Aspirin) .Marland Kitchen... Take one tablet by mouth daily    Lisinopril 20 Mg Tabs (Lisinopril) .Marland Kitchen... Take one tablet by mouth daily    Metoprolol Succinate 50 Mg Xr24h-tab (Metoprolol succinate) .Marland Kitchen... Take one tablet by mouth daily    Hydrochlorothiazide 12.5 Mg Tabs (Hydrochlorothiazide) .Marland Kitchen... Take one tablet by mouth daily.  Problem # 3:  HYPERCHOLESTEROLEMIA (ICD-272.0)  stable on medication.  Will get her to get levels on next visit.  Her updated medication list for this problem includes:    Crestor 10 Mg Tabs (Rosuvastatin calcium) .Marland Kitchen... Take one tablet by mouth daily.  Her updated medication list for this problem includes:    Crestor 10 Mg Tabs (Rosuvastatin calcium) .Marland Kitchen... Take one tablet by mouth daily.  Problem # 4:  CAROTID ARTERY STENOSIS (ICD-433.10)  For followup doppler in near future.  Her updated medication list for this problem includes:    Aspirin 81 Mg Tbec (Aspirin) .Marland Kitchen... Take one tablet by mouth daily  Orders: EKG w/ Interpretation ([address]) Carotid Duplex (Carotid Duplex)  Her updated medication list for this problem includes:    Aspirin 81 Mg Tbec  (Aspirin) .Marland Kitchen... Take one tablet by mouth daily  Problem # 5:  HYPERTHYROIDISM (ICD-242.90)  Followed by Dr. Evlyn Kanner.  Due for more labs per patient.  Her updated medication list for this problem includes:    Metoprolol Succinate 50 Mg Xr24h-tab (Metoprolol succinate) .Marland Kitchen... Take one tablet by mouth daily    Methimazole 10 Mg Tabs (Methimazole) .Marland Kitchen... Take 1 tablet by mouth once a day  Her updated medication list for this problem includes:    Metoprolol Succinate 50 Mg Xr24h-tab (Metoprolol succinate) .Marland Kitchen... Take one tablet by mouth daily    Methimazole 10 Mg Tabs (Methimazole) .Marland Kitchen... Take 1 tablet by mouth once a day  Patient Instructions: 1)  Your physician recommends that you schedule a follow-up appointment in: 6 WEEKS 2)  Your physician has requested that you regularly monitor and record your blood  pressure readings at home.  Please use the same machine at the same time of day to check your readings and record them to bring to your follow-up visit.----Please keep a calender with your BP everyday and mark if you took your medications. 3)  PLEASE REMEMBER TO TAKE YOUR MEDICATIONS.  4)  Your physician has requested that you have a carotid duplex in 6 WEEKS. This test is an ultrasound of the carotid arteries in your neck. It looks at blood flow through these arteries that supply the brain with blood. Allow one hour for this exam. There are no restrictions or special instructions.

## 2010-10-13 NOTE — Assessment & Plan Note (Signed)
Summary: f41m  Medications Added ENALAPRIL-HYDROCHLOROTHIAZIDE 5-12.5 MG TABS (ENALAPRIL-HYDROCHLOROTHIAZIDE) take one tablet by mouth twice a day ONE DAILY  TABS (MULTIPLE VITAMIN) Take 1 tablet by mouth once a day      Allergies Added: NKDA  Visit Type:  6 months follow up Primary Provider:  Ursula Beath  CC:  palpitations.  History of Present Illness: Overall doing well.  Forgets to take meds at times.  Does not keep BP log.  No chest pain.  Occasional palpitations.    Current Medications (verified): 1)  Aspirin 81 Mg Tbec (Aspirin) .... Take One Tablet By Mouth Daily 2)  Enalapril-Hydrochlorothiazide 5-12.5 Mg Tabs (Enalapril-Hydrochlorothiazide) .... Take 1 Tablet in Am, and 1/2 Tablet Pm 3)  Lexapro 10 Mg Tabs (Escitalopram Oxalate) .... Take 1 Tablet By Mouth Once A Day- Not Taking Right Now. Can't Afford 4)  Nitroglycerin 0.4 Mg Subl (Nitroglycerin) .... One Tablet Under Tongue Every 5 Minutes As Needed For Chest Pain---May Repeat Times Three 5)  Pravastatin Sodium 40 Mg Tabs (Pravastatin Sodium) .... Take 1 Tablet By Mouth Once A Day 6)  Clonazepam Odt 0.5 Mg Tbdp (Clonazepam) .... As Needed 7)  One Daily  Tabs (Multiple Vitamin) .... Take 1 Tablet By Mouth Once A Day  Allergies (verified): No Known Drug Allergies  Vital Signs:  Patient profile:   55 year old female Height:      64 inches Weight:      168.50 pounds BMI:     29.03 Pulse rate:   81 / minute Pulse rhythm:   regular Resp:     18 per minute BP sitting:   150 / 80  (right arm) Cuff size:   large  Vitals Entered By: Vikki Ports (June 15, 2009 9:03 AM)  Physical Exam  General:  Well developed, well nourished, in no acute distress. Neck:  Soft left carotid bruit. Chest Wall:  MS scar, and breast scar. Lungs:  Clear bilaterally to auscultation and percussion. Heart:  NOrmal S1 and S2.  prom S4. Abdomen:  Bowel sounds positive; abdomen soft and non-tender without masses, organomegaly, or  hernias noted. No hepatosplenomegaly.   Impression & Recommendations:  Problem # 1:  CAD (ICD-414.00) Prior CABG.  Abnormal ECG but LVh and BP issues.  Routine stress with increase thyroid.   and echo suggested to ro ischemia and assess LVH. Her updated medication list for this problem includes:    Aspirin 81 Mg Tbec (Aspirin) .Marland Kitchen... Take one tablet by mouth daily    Enalapril-hydrochlorothiazide 5-12.5 Mg Tabs (Enalapril-hydrochlorothiazide) .Marland Kitchen... Take one tablet by mouth twice a day    Nitroglycerin 0.4 Mg Subl (Nitroglycerin) ..... One tablet under tongue every 5 minutes as needed for chest pain---may repeat times three  Orders: EKG w/ Interpretation (93[address]) TLB-Lipid Panel (80061-LIPID) TLB-Hepatic/Liver Function Pnl (80076-HEPATIC) TLB-BMP (Basic Metabolic Panel-BMET) (80048-METABOL) Echocardiogram (Echo) Treadmill (Treadmill)  Problem # 2:  HYPERCHOLESTEROLEMIA (ICD-272.0) Check labs Her updated medication list for this problem includes:    Pravastatin Sodium 40 Mg Tabs (Pravastatin sodium) .Marland Kitchen... Take 1 tablet by mouth once a day  Orders: EKG w/ Interpretation (93000) TLB-Lipid Panel (80061-LIPID) TLB-Hepatic/Liver Function Pnl (80076-HEPATIC) TLB-BMP (Basic Metabolic Panel-BMET) (80048-METABOL) Echocardiogram (Echo) Treadmill (Treadmill)  Problem # 3:  HYPERTENSION (ICD-401.9) Poor control at present.  Need to check everyday, keep log, bring back in.  Increase meds to one tab two times a day.  Check BMET, and reassess. Compliance is poor.  Will check BMET and follow. Her updated medication list for this problem  includes:    Aspirin 81 Mg Tbec (Aspirin) .Marland Kitchen... Take one tablet by mouth daily    Enalapril-hydrochlorothiazide 5-12.5 Mg Tabs (Enalapril-hydrochlorothiazide) .Marland Kitchen... Take one tablet by mouth twice a day  Orders: EKG w/ Interpretation (93[address]) TLB-Lipid Panel (80061-LIPID) TLB-Hepatic/Liver Function Pnl (80076-HEPATIC) TLB-BMP (Basic Metabolic Panel-BMET)  (80048-METABOL) Echocardiogram (Echo) Treadmill (Treadmill)  Problem # 4:  HYPERTHYROIDISM (ICD-242.90) Follow up with Dr. Evlyn Kanner.  Needs better compliance.  Has restarted meds.  Patient Instructions: 1)  Your physician has requested that you have an echocardiogram in 1 WEEK.  Echocardiography is a painless test that uses sound waves to create images of your heart. It provides your doctor with information about the size and shape of your heart and how well your heart's chambers and valves are working.  This procedure takes approximately one hour. There are no restrictions for this procedure. 2)  Your physician has requested that you have an exercise tolerance test in 1 WEEK.  For further information please visit [email or web address]  Please also follow instruction sheet, as given. 3)  Your physician recommends that you have a FASTING LIPID, LIVER, BMET today 4)  Your physician has recommended you make the following change in your medication: INCREASE Enalapril HCTZ to one tablet by mouth twice a day Prescriptions: ENALAPRIL-HYDROCHLOROTHIAZIDE 5-12.5 MG TABS (ENALAPRIL-HYDROCHLOROTHIAZIDE) take one tablet by mouth twice a day  #180 x 4   Entered by:   Julieta Gutting, RN, BSN   Authorized by:   Ronaldo Miyamoto, MD, [facility]   Signed by:   Julieta Gutting, RN, BSN on 06/15/2009   Method used:   Electronically to        [facility] (retail)       [address]       [place], [place]  [address]       Ph: [phone]       Fax: [phone]   RxID:   [id number]

## 2010-10-13 NOTE — Progress Notes (Addendum)
Summary: B/P running over 200  Phone Note Call from Patient Call back at Home Phone [phone] Call back at [phone]   Caller: Patient Summary of Call: B/P running high over 200 Initial call taken by: Judie Grieve,  June 29, 2010 4:05 PM  Follow-up for Phone Call        I spoke with the pt and she said her BP has been running 200/90-100.  The pt's only symptom is a slight headache.  I asked the pt if she had been taking her medications and she said yes.  Then the pt asked me if she is suppose to take HCTZ.  I made her aware that this is a medication Dr Riley Kill wanted her to take for BP management.  The pt said she will go to the drug store and get this medication today.  I instructed the pt to continue monitoring her BP and call back next week if her BP was still running high.  Pt agreed with plan.   Follow-up by: Julieta Gutting, RN, BSN,  June 29, 2010 4:46 PM     Appended Document: B/P running over 200 Will need to watch BP closely.  She has history of missing medications as well.  She will write down her BPs and we will make adjustments.  TS

## 2010-10-13 NOTE — Miscellaneous (Signed)
Summary: Crestor  Clinical Lists Changes  Medications: Changed medication from PRAVASTATIN SODIUM 40 MG TABS (PRAVASTATIN SODIUM) Take 1 tablet by mouth once a day to CRESTOR 10 MG TABS (ROSUVASTATIN CALCIUM) Take one tablet by mouth daily.

## 2010-11-02 ENCOUNTER — Encounter: Payer: Self-pay | Admitting: Cardiology

## 2010-11-02 ENCOUNTER — Ambulatory Visit (INDEPENDENT_AMBULATORY_CARE_PROVIDER_SITE_OTHER): Payer: Medicare Other | Admitting: Cardiology

## 2010-11-02 DIAGNOSIS — I10 Essential (primary) hypertension: Secondary | ICD-10-CM

## 2010-11-02 DIAGNOSIS — E7800 Pure hypercholesterolemia, unspecified: Secondary | ICD-10-CM

## 2010-11-02 DIAGNOSIS — I2510 Atherosclerotic heart disease of native coronary artery without angina pectoris: Secondary | ICD-10-CM

## 2010-11-16 ENCOUNTER — Encounter: Payer: Self-pay | Admitting: Physician Assistant

## 2010-11-16 ENCOUNTER — Other Ambulatory Visit: Payer: Self-pay | Admitting: Cardiology

## 2010-11-16 ENCOUNTER — Encounter: Payer: Self-pay | Admitting: Cardiology

## 2010-11-16 ENCOUNTER — Other Ambulatory Visit (INDEPENDENT_AMBULATORY_CARE_PROVIDER_SITE_OTHER): Payer: Medicare Other

## 2010-11-16 ENCOUNTER — Ambulatory Visit (INDEPENDENT_AMBULATORY_CARE_PROVIDER_SITE_OTHER): Payer: Medicare Other

## 2010-11-16 DIAGNOSIS — I421 Obstructive hypertrophic cardiomyopathy: Secondary | ICD-10-CM

## 2010-11-16 DIAGNOSIS — E7800 Pure hypercholesterolemia, unspecified: Secondary | ICD-10-CM

## 2010-11-16 DIAGNOSIS — I10 Essential (primary) hypertension: Secondary | ICD-10-CM

## 2010-11-16 LAB — BASIC METABOLIC PANEL
BUN: 18 mg/dL (ref 6–23)
CO2: 26 mEq/L (ref 19–32)
Calcium: 9 mg/dL (ref 8.4–10.5)
Chloride: 105 mEq/L (ref 96–112)
Creatinine, Ser: 0.6 mg/dL (ref 0.4–1.2)
GFR: 143.58 mL/min (ref >60.00)
Glucose, Bld: 94 mg/dL (ref 70–99)
Potassium: 3.6 mEq/L (ref 3.5–5.1)
Sodium: 139 mEq/L (ref 135–145)

## 2010-11-17 NOTE — Assessment & Plan Note (Signed)
Summary: ROV   Visit Type:  Follow-up Primary Provider:  Ursula Beath  CC:  pt has no complaints today. pt has questions re meds..  History of Present Illness: Patient crying in office today.   Some personal issue not related to [place].  Has not been taking Lisinopril for a week, and had an issue with getting it refilled, again not related to this office.  Feels good at present.   Problems Prior to Update: 1)  Cad  (ICD-414.00) 2)  Hypercholesterolemia  (ICD-272.0) 3)  Hypertension  (ICD-401.9) 4)  Hyperthyroidism  (ICD-242.90) 5)  Depression  (ICD-311) 6)  Carotid Artery Stenosis  (ICD-433.10)  Current Medications (verified): 1)  Aspirin 81 Mg Tbec (Aspirin) .... Take One Tablet By Mouth Daily 2)  Lisinopril 40 Mg Tabs (Lisinopril) .... Take One Tablet By Mouth Daily 3)  Lexapro 10 Mg Tabs (Escitalopram Oxalate) .... Take 1 1/2  Tablet By Mouth Once A Day- 4)  Nitroglycerin 0.4 Mg Subl (Nitroglycerin) .... One Tablet Under Tongue Every 5 Minutes As Needed For Chest Pain---May Repeat Times Three 5)  Crestor 10 Mg Tabs (Rosuvastatin Calcium) .... Take One Tablet By Mouth Daily. 6)  Clonazepam Odt 0.5 Mg Tbdp (Clonazepam) .... As Needed 7)  One Daily  Tabs (Multiple Vitamin) .... Take 1 Tablet By Mouth Once A Day 8)  Metoprolol Succinate 50 Mg Xr24h-Tab (Metoprolol Succinate) .... Take One Tablet By Mouth Daily 9)  Hydrochlorothiazide 25 Mg Tabs (Hydrochlorothiazide) .... Take One Tablet By Mouth Daily. 10)  Methimazole 10 Mg Tabs (Methimazole) .... Take 1 Tablet By Mouth Once A Day  Allergies (verified): No Known Drug Allergies  Vital Signs:  Patient profile:   55 year old female Height:      64 inches Weight:      164.25 pounds BMI:     28.30 Pulse rate:   68 / minute Resp:     18 per minute BP sitting:   172 / 90  (left arm) Cuff size:   regular  Vitals Entered By: Celestia Khat, CMA (November 02, 2010 11:28 AM)  Physical Exam  General:  Well developed, well  nourished, in no acute distress.  Crying in office Lungs:  Clear bilaterally to auscultation and percussion. Heart:  Non-displaced PMI, chest non-tender; regular rate and rhythm, S1, S2 without murmurs, rubs or gallops. Carotid upstroke normal, no bruit. Normal  Pulses:  pulses normal in all 4 extremities Extremities:  No clubbing or cyanosis. Neurologic:  Alert and oriented x 3.   EKG  Procedure date:  11/02/2010  Findings:      NSR.  LVH Non specific T abnormality.    Impression & Recommendations:  Problem # 1:  CAD (ICD-414.00)  stable at present.  No recurrent symptoms. Her updated medication list for this problem includes:    Aspirin 81 Mg Tbec (Aspirin) .Marland Kitchen... Take one tablet by mouth daily    Lisinopril 20 Mg Tabs (Lisinopril) .Marland Kitchen... Take one tablet by mouth daily    Nitroglycerin 0.4 Mg Subl (Nitroglycerin) ..... One tablet under tongue every 5 minutes as needed for chest pain---may repeat times three    Metoprolol Succinate 50 Mg Xr24h-tab (Metoprolol succinate) .Marland Kitchen... Take one tablet by mouth daily  Her updated medication list for this problem includes:    Aspirin 81 Mg Tbec (Aspirin) .Marland Kitchen... Take one tablet by mouth daily    Lisinopril 40 Mg Tabs (Lisinopril) .Marland Kitchen... Take one tablet by mouth daily    Nitroglycerin 0.4 Mg Subl (Nitroglycerin) ..... One  tablet under tongue every 5 minutes as needed for chest pain---may repeat times three    Metoprolol Succinate 50 Mg Xr24h-tab (Metoprolol succinate) .Marland Kitchen... Take one tablet by mouth daily  Orders: EKG w/ Interpretation (93000)  Problem # 2:  HYPERTENSION (ICD-401.9)  Not well controlled, but medicines not all that compliant.  Needs to resume Lisinopril.   Her story is inconsistent with regard to medications, and [facility] deactivated prescription as she did not pick up.   BMET next month. Her updated medication list for this problem includes:    Aspirin 81 Mg Tbec (Aspirin) .Marland Kitchen... Take one tablet by mouth daily     Lisinopril 20 Mg Tabs (Lisinopril) .Marland Kitchen... Take one tablet by mouth daily    Metoprolol Succinate 50 Mg Xr24h-tab (Metoprolol succinate) .Marland Kitchen... Take one tablet by mouth daily    Hydrochlorothiazide 25 Mg Tabs (Hydrochlorothiazide) .Marland Kitchen... Take one tablet by mouth daily.  Orders: EKG w/ Interpretation (93000)  Problem # 3:  HYPERCHOLESTEROLEMIA (ICD-272.0)  On lipid lowering treatment. Her updated medication list for this problem includes:    Crestor 10 Mg Tabs (Rosuvastatin calcium) .Marland Kitchen... Take one tablet by mouth daily.  Orders: EKG w/ Interpretation (93000)  Patient Instructions: 1)  Your physician recommends that you schedule a follow-up appointment in: 3 MONTHS with Dr Riley Kill 2)  Your physician recommends that you return for lab work (BMP) and BP check  in: 2 WEEKS (401.9, 272.0, 425.1) 3)  Your physician has recommended you make the following change in your medication: START Lisinopril 20mg  one by mouth daily Prescriptions: LISINOPRIL 20 MG TABS (LISINOPRIL) Take one tablet by mouth daily  #30 x 11   Entered by:   Julieta Gutting, RN, BSN   Authorized by:   Ronaldo Miyamoto, MD, [facility]   Signed by:   Julieta Gutting, RN, BSN on 11/02/2010   Method used:   Electronically to        [facility] (retail)       [address]       [place], [place]  [address]       Ph: [phone]       Fax: [phone]   RxID:   [id number]

## 2010-11-22 NOTE — Assessment & Plan Note (Signed)
Summary: nurse visit bp check/sl   Allergies: No Known Drug Allergies  Vital Signs:  Patient profile:   55 year old female Height:      64 inches Weight:      163.75 pounds Pulse rate:   64 / minute BP sitting:   210 / 110  (right arm)  Vitals Entered By: Dessie Coma  LPN (November 15, 452 9:10 AM) Comments Per Tereso Newcomer PA,give patient Clonidine 0.1mg  now and wait 20 minutes to recheck BP.  To increase Lisinopril to 40mg  once daily and add Amlodipine 5mg  once daily .  To return in one week for BP check and BMET.  Clonidine 0.1mg  given at 9:10am per Adelene Idler.  After waiting x 20 minutes BP still 210/110 at 9:32am.  190/82 at 9:40am.  Given another Clonidine 0.1mg  at 9:40am per Tereso Newcomer PA and to wait 20-59minutes and recheck BP. After BP 178/88.  Patient feels good - has alot on mind.  To go home and rest and return next Wed for f/u bp and BMET.  Dessie Coma LPN    Patient Instructions: 1)  Your physician recommends that you return for lab work in:1 week for BMET and BP check 2)  Your physician has recommended you make the following change in your medication: Increase Lisinopril to 40mg  once daily and add Amlodipine 5mg  once daily.

## 2010-11-23 ENCOUNTER — Encounter (INDEPENDENT_AMBULATORY_CARE_PROVIDER_SITE_OTHER): Payer: Medicare Other

## 2010-11-23 ENCOUNTER — Encounter: Payer: Self-pay | Admitting: Cardiology

## 2010-11-23 ENCOUNTER — Other Ambulatory Visit (INDEPENDENT_AMBULATORY_CARE_PROVIDER_SITE_OTHER): Payer: Medicare Other

## 2010-11-23 ENCOUNTER — Other Ambulatory Visit: Payer: Self-pay | Admitting: Cardiology

## 2010-11-23 ENCOUNTER — Encounter: Payer: Self-pay | Admitting: Physician Assistant

## 2010-11-23 DIAGNOSIS — I10 Essential (primary) hypertension: Secondary | ICD-10-CM

## 2010-11-23 DIAGNOSIS — Z79899 Other long term (current) drug therapy: Secondary | ICD-10-CM

## 2010-11-23 LAB — BASIC METABOLIC PANEL
BUN: 17 mg/dL (ref 6–23)
CO2: 27 mEq/L (ref 19–32)
Calcium: 9.3 mg/dL (ref 8.4–10.5)
Chloride: 103 mEq/L (ref 96–112)
Creatinine, Ser: 0.7 mg/dL (ref 0.4–1.2)
GFR: 109.17 mL/min (ref >60.00)
Glucose, Bld: 99 mg/dL (ref 70–99)
Potassium: 3.5 mEq/L (ref 3.5–5.1)
Sodium: 139 mEq/L (ref 135–145)

## 2010-11-25 ENCOUNTER — Telehealth: Payer: Self-pay | Admitting: Cardiology

## 2010-11-29 NOTE — Assessment & Plan Note (Signed)
Summary: BP check 401.9/[place]/jt  Nurse Visit   Vital Signs:  Patient profile:   55 year old female Height:      65 inches Weight:      160.75 pounds BMI:     26.85 Pulse rate:   65 / minute BP sitting:   144 / 88  (right arm)  Impression & Recommendations:  Problem # 1:  HYPERTENSION (ICD-401.9)  Her updated medication list for this problem includes:    Aspirin 81 Mg Tbec (Aspirin) .Marland Kitchen... Take one tablet by mouth daily    Lisinopril 40 Mg Tabs (Lisinopril) .Marland Kitchen... Take one tablet by mouth once daily    Metoprolol Succinate 50 Mg Xr24h-tab (Metoprolol succinate) .Marland Kitchen... Take one tablet by mouth daily    Hydrochlorothiazide 25 Mg Tabs (Hydrochlorothiazide) .Marland Kitchen... Take one tablet by mouth daily.    Amlodipine Besylate 5 Mg Tabs (Amlodipine besylate) .Marland Kitchen... Take one tablet by mouth once daily Pt. in for B/P check and BMET blood work. B/P taken on her right arm only 144/88 and 142/84. Pt. took her B/p medication: Lisinopril 40 mg and Amlodipide 5 mg at 7:30 AM today. She has no c/o at this time. Pt. was made aware that it is very important to take her medication every day as recommended per MD. Pt. verbalized understanding. She is aware of the office visit with Dr. Riley Kill on May 23rd at 9:30 Am.    Allergies: No Known Drug Allergies

## 2010-11-29 NOTE — Progress Notes (Signed)
Summary: Start Potassium  Phone Note Outgoing Call   Call placed by: Julieta Gutting, RN, BSN,  November 25, 2010 4:23 PM Call placed to: Patient Summary of Call: I spoke with the pt and made her aware of lab results.  The pt will start potassium supplement daily and recheck BMP on 12/12/10.  Initial call taken by: Julieta Gutting, RN, BSN,  November 25, 2010 4:24 PM    New/Updated Medications: POTASSIUM CHLORIDE CR 10 MEQ CR-CAPS (POTASSIUM CHLORIDE) Take one tablet by mouth daily Prescriptions: POTASSIUM CHLORIDE CR 10 MEQ CR-CAPS (POTASSIUM CHLORIDE) Take one tablet by mouth daily  #30 x 6   Entered by:   Julieta Gutting, RN, BSN   Authorized by:   Ronaldo Miyamoto, MD, [facility]   Signed by:   Julieta Gutting, RN, BSN on 11/25/2010   Method used:   Electronically to        [facility] (retail)       [address]       [place], [place]  [address]       Ph: [phone]       Fax: [phone]   RxID:   [id number] HYDROCHLOROTHIAZIDE 25 MG TABS (HYDROCHLOROTHIAZIDE) Take one tablet by mouth daily.  #30 x 6   Entered by:   Julieta Gutting, RN, BSN   Authorized by:   Ronaldo Miyamoto, MD, [facility]   Signed by:   Julieta Gutting, RN, BSN on 11/25/2010   Method used:   Electronically to        [facility] (retail)       [address]       [place], [place]  [address]       Ph: [phone]       Fax: [phone]   RxID:   [id number]

## 2010-12-12 ENCOUNTER — Other Ambulatory Visit (INDEPENDENT_AMBULATORY_CARE_PROVIDER_SITE_OTHER): Payer: Medicare Other | Admitting: *Deleted

## 2010-12-12 DIAGNOSIS — I10 Essential (primary) hypertension: Secondary | ICD-10-CM

## 2010-12-12 DIAGNOSIS — I2510 Atherosclerotic heart disease of native coronary artery without angina pectoris: Secondary | ICD-10-CM

## 2010-12-12 LAB — BASIC METABOLIC PANEL
BUN: 17 mg/dL (ref 6–23)
CO2: 27 mEq/L (ref 19–32)
Calcium: 9.3 mg/dL (ref 8.4–10.5)
Chloride: 99 mEq/L (ref 96–112)
Creatinine, Ser: 0.7 mg/dL (ref 0.4–1.2)
GFR: 107.4 mL/min (ref >60.00)
Glucose, Bld: 98 mg/dL (ref 70–99)
Potassium: 4 mEq/L (ref 3.5–5.1)
Sodium: 138 mEq/L (ref 135–145)

## 2010-12-13 ENCOUNTER — Telehealth: Payer: Self-pay | Admitting: Cardiology

## 2010-12-13 NOTE — Telephone Encounter (Signed)
Pt calling re medical forms

## 2010-12-13 NOTE — Telephone Encounter (Signed)
The pt received paperwork from the state because she is being re-evaluated for disability.  The pt was wondering if Dr Riley Kill could complete this paperwork.  I made the pt aware that Dr Riley Kill could only complete the Cardiology paperwork.  The pt has contacted [facility] and made an appointment to establish with primary care.  This appointment is on 01/19/11.  The pt will have [facility] complete paperwork.  I made the pt aware of her BMP results.  The pt has not been checking her BP consistently.  The pt's BP this morning was 133/79.  She said for the most part is has been running in the 170 range.  I stressed the importance of the pt monitoring her BP at home and she agreed to start keeping a BP log on a daily basis.   The pt said she has been taking her medications as prescribed, but ran out of thyroid medication (the pt has contacted Dr Evlyn Kanner).  I instructed the pt to call the office if her SBP is continuously above 150.  Pt agreed with plan.

## 2011-01-23 ENCOUNTER — Telehealth: Payer: Self-pay | Admitting: Cardiology

## 2011-01-23 NOTE — Telephone Encounter (Signed)
Labs faxed to [facility] @ [phone]  01/23/11/km

## 2011-01-24 NOTE — Assessment & Plan Note (Signed)
[facility]                            CARDIOLOGY OFFICE NOTE   Brenda [name], Brenda [name]                         MRN:          [id number]  DATE:09/18/2008                            DOB:          1956-07-26    Brenda [name] is in for followup.  In general, she has been stable.  She  has previously seen Dr. Smith [name], but she has no longer at her  practice.  Her benefits had been reinstated.  She had a good holiday  with her daughter and she is trying to do some exercise and generally  feels better.  She saw a psychiatrist today and is being placed on  Lexapro.   PHYSICAL EXAMINATION:  VITAL SIGNS:  Blood pressure 176/80 and pulse 84.  LUNGS:  Lung fields clear.  CARDIAC:  Rhythm is regular.  No murmur.   IMPRESSION:  1. Coronary artery disease.  2. Hypertension.  3. Hyperthyroidism.  4. History of breast cancer.   PLAN:  1. Return to clinic in 6 weeks.  2. Check BMET in 3 weeks.  3. Increase enalapril to 1 tablet in the morning and one-half in the      afternoon with potential for increasing to twice a day.  She might      require some potassium supplementation at that point in time.     Brenda [name]. Riley Kill, MD, [facility]  Electronically Signed    TDS/MedQ  DD: 09/18/2008  DT: 09/19/2008  Job #: [id number]

## 2011-01-24 NOTE — Assessment & Plan Note (Signed)
[facility]                            CARDIOLOGY OFFICE NOTE   AVANELL, BANWART                         MRN:          [id number]  DATE:08/05/2008                            DOB:          [birth year]    Ms. Lanphier is in for followup.  In general, she is cardiac-wise stable.  However, she sounds to some extent like she has given up.  She has not  been taking her Plavix.  She has basically stopped her Vytorin and not  been taking Benicar HCT either.  She is not sure about pravastatin.  She  is no longer on the thyroid medication.  She has been taking her  aspirin, but that is just about it.  As a result, her blood pressure has  been really quite a bit higher.  She does not know why she has not been  doing all of this.  She and I have had multiple discussions in the past  about all of this, and her status is unclear.  Her recent carotid  Dopplers were done here and they revealed progression of carotid disease  in the right.  There is 40-59% on the right and 0-39% on the left with  mildly elevated left subclavian velocities without any evidence of any  kind of steel.   MEDICATIONS:  Currently, her medicines are as described.   PHYSICAL EXAMINATION:  VITAL SIGNS:  Today, the blood pressure was  202/88, the pulse was 85.  LUNGS:  Fields are clear.  CARDIAC:  Rhythm was regular.  There is not a definite left subclavian  bruit noted.   The patient's electrocardiogram demonstrates normal sinus rhythm.  There  is clearly evidence of left ventricular hypertrophy with repolarization  abnormalities.   This is a difficult situation with Ms. Matherly.  We have given her  samples for enalapril 5/hydrochlorothiazide 12.5.  She apparently is in  some disagreement over her disability and benefits.  We will need to  follow her up closely, and we will try to track her blood pressure until  she is clinically better.  She may be able to take enalapril  5/hydrochlorothiazide 12.5 on a twice a day basis, but we will need to  follow her potassium closely.  Nonetheless, she remains at risk while  she abruptly discontinues all of her medicines.  Her EKG clearly is  reflective of the left ventricular hypertrophy process, but not  dramatically changed from November 2008.  The voltage seems somewhat  higher compared to 2007.  We also were unsure of her thyroid status,  although she said she did have her thyroid checked and they took her off  of medication, but I do not know how reliable this might be.  We will  see her back in followup a few weeks.     Arturo Morton. Riley Kill, MD, [facility]  Electronically Signed    TDS/MedQ  DD: 08/09/2008  DT: 08/09/2008  Job #: [id number]

## 2011-01-24 NOTE — Assessment & Plan Note (Signed)
[facility]                            CARDIOLOGY OFFICE NOTE   Brenda [name], Brenda [name]                         MRN:          [id number]  DATE:11/03/2008                            DOB:          1956-04-14    Brenda [name] is in for a followup visit.  From a cardiac standpoint, she  has been stable.  Her blood pressure has been up the last 2 days, but  prior to that has been relatively well controlled.  She also questions  her lipid results and that she said she really was not taking it on a  regular basis.  Her last LDL was 141 with a total cholesterol of 205.  BUN and creatinine were normal with a potassium of 3.8.  We have  discussed the results of her laboratory studies with her in detail.  I  am in the process of eventually increasing her medicines, and with a  blood pressures being up.  Our goal has been probably if they remains at  increase it back to 1 in the morning, 1 in the evening, after that she  would need to have a followup basic metabolic profile.   PHYSICAL EXAMINATION:  VITAL SIGNS:  Her blood pressure is 178/84 and  pulse is 75.  LUNG:  Fields clear.  CARDIAC:  Rhythm is regular.  No murmur, gallop, or rub.   IMPRESSION:  1. Hypertension, borderline control.  2. Hypercholesterolemia, under less than optimal control.  3. Depression.  4. Hyperthyroidism.   PLAN:  1. Return to clinic in 2 months.  2. Repeat basic metabolic profile, lipid, and liver profile in 6      weeks.  3. She is to monitor her blood pressure and if it is up, she will go      to 1 tablet in the morning and 1 in the evening.  4. I have asked her to make sure she gets her laboratories checked at      Dr. Rinaldo [name] office.     Brenda [name]. Riley Kill, MD, [facility]  Electronically Signed    TDS/MedQ  DD: 11/03/2008  DT: 11/03/2008  Job #: [id number]   cc:   Brenda [name], M.D.

## 2011-01-24 NOTE — Assessment & Plan Note (Signed)
[facility]                            CARDIOLOGY OFFICE NOTE   Brenda [name], RUMMELL                         MRN:          [id number]  DATE:08/06/2007                            DOB:          [birth year]    Brenda [name] is in for followup.  In general, she has been stable.  She has not  been checking her blood pressures at home.  She has had, again, quite a  bit of trauma related to her daughter.  Her daughter graduated from high  school with a 22-year-old baby.  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  She has now moved back.  Her daughter recently wrecked her car and  the patient has had to deal with the not having a car and getting a  rental car.  She has trouble focusing at times as well as memory.  She  has been living month to month and her finances have been somewhat  stretched.  She has had regular labs done at Dr. Rinaldo Cloud office.  From a  cardiac standpoint, has rare episodes of chest pain.  She does do some  weight lifting.   Her most recent carotid did not demonstrate significant high-grade  recurrence.   CURRENT MEDICATIONS:  1. Plavix 75 mg daily.  2. Vytorin 10/80 daily.  3. Benicar hydrochlorothiazide 40/25 daily.  4. Aspirin 81 mg daily.  5. Methimazole 10 mg daily.   PHYSICAL:  She is alert and oriented.  Blood pressure 156/88, pulse 80.  Carotid incisions.  The lung fields are clear to auscultation and percussion.  The cardiac rhythm is regular without a significant murmur noted.  EXTREMITIES:  Reveal no edema.   IMPRESSION:  1. Coronary artery disease status post coronary artery bypass grafting      surgery.  2. Left breast cancer treated with lumpectomy, chemotherapy and      radiation in 1999.  3. Graves disease treated medically.  4. History of remote smoking, which she discontinued in 2001.  5. Hypercholesterolemia with lipid-lowering therapy.  6. Hypertension, questionable control.   RECOMMENDATIONS:  1. I have  encouraged her to use her blood pressure cuff and check this      on a regular basis.  2. I have continuously reminded her of her need to take her      medications, in the appropriately prescribed fashion and on time.  3. She will try to arrange to have Dr. Rinaldo Cloud lab studies sent to our      office for review.  4. Continue current medical regimen and remind her about need,      specifically about thyroid medications.     Arturo Morton. Riley Kill, MD, [facility]  Electronically Signed    TDS/MedQ  DD: 08/06/2007  DT: 08/06/2007  Job #: [id number]

## 2011-01-27 ENCOUNTER — Encounter: Payer: Self-pay | Admitting: *Deleted

## 2011-01-27 NOTE — Procedures (Signed)
[place]  Patient:    Brenda [name], Brenda [name]                       MRN: [id number] Proc. Date: 06/06/00 Adm. Date:  [id number] Attending:  Charna Elizabeth CC:         Paulino Rily, M.D.   Procedure Report  DATE OF BIRTH:  June 15, 1956  PROCEDURE PERFORMED:  Colonoscopy.  ENDOSCOPIST:  Anselmo Rod, M.D.  INSTRUMENT USED:  Olympus video colonoscope.  INDICATIONS FOR PROCEDURE:  Guaiac positive stool and _______ history of breast cancer in a 55 year old black female, rule out colonic polyps, masses, hemorrhoids, etc.  PREPROCEDURE PREPARATION:  Informed consent was procured from the patient. The patient was fasted for eight hours prior to the procedure, and prepped with a bottle of magnesium citrate and a gallon of NuLytely the night prior to the procedure.  PREPROCEDURE PHYSICAL:  VITAL SIGNS:  Stable.  NECK:  Supple.  CHEST:  Clear to auscultation, S1 and S2 regular.  ABDOMEN:  Soft with normal abdominal bowel sounds.  DESCRIPTION OF PROCEDURE:  The patient was placed in the left lateral decubitus position, and sedated with an additional 20 mg of Demerol and 2 mg of Versed intravenously.  Once the patient was adequately sedated, maintained on low flow oxygen and continuous cardiac monitoring, the Olympus video colonoscope was advanced from the rectum to the cecum without difficulty.  No masses, polyps, erosions, or ulcerations were seen.  There was no evidence of diverticulosis.  A small external hemorrhoid was seen on anal inspection.  IMPRESSION:  Normal colonoscopy.  RECOMMENDATIONS:  Small bowel follow through will be done to complete the GI evaluation, and further recommendations made as warranted. DD:  06/06/00 TD:  06/07/00 Job: [id number]

## 2011-01-27 NOTE — Op Note (Signed)
[place]. [facility]  Patient:    Brenda [name], Brenda [name]                       MRN: [id number] Proc. Date: 06/12/00 Adm. Date:  [id number] Attending:  Mikey Bussing CC:         [facility] - Attention:  Arturo Morton. Riley Kill, M.D.   Operative Report  OPERATION:  Coronary artery bypass grafting x 5 (left internal mammary artery to left anterior descending coronary artery, sequential saphenous vein graft to distal right coronary artery and posterior descending, sequential saphenous vein graft to ramus intermediate and obtuse marginal).  PREOPERATIVE DIAGNOSIS:  Class 4 progressive angina with progressive three-vessel coronary artery disease.  POSTOPERATIVE DIAGNOSIS:  Class 4 progressive angina with progressive three-vesesl coronary artery disease.  SURGEON:  Mikey Bussing, M.D.  ASSISTANT:  Loura Pardon, P.A.-C.  ANESTHESIA:  General.  INDICATIONS:  The patient is a 55 year old black female with known coronary artery disease and status post recent left carotid endarterectomy.  She underwent a recatheterization because of persistent chest pain, with a positive stress Cardiolite, and the findings were reviewed by Dr. Arturo Morton. Stuckey and myself.  She had severe LAD disease, distal right disease, with ostial right disease as well, and a significant circumflex lesion.  Because of her severe three-vessel disease, she was referred for surgical coronary revascularization.  Her overall ejection fraction was normal.  Prior to the operation I examined the patient in the hospital room, and reviewed the results of her cardiac catheterization with the patient and with her family.  I reviewed the indications and expected benefits of coronary artery bypass grafting surgery.  I discussed the major aspects of the operation, including the choice of conduit, the location of the surgical incisions, the use of cardiopulmonary bypass and general anesthesia, and  the expected hospital recovery period.  I reviewed the risks associated with this operation, including the risks of myocardial infarction, CVA, bleeding, infection, and death.  She understood the alternatives of surgical therapy, and agreed to proceed with the operation as planned under informed consent.  FINDINGS:  This patient has severe diffuse coronary artery disease in a premature pattern.  The LAD had significant diffuse plaque.  The posterior descending had significant diffuse plaque.  The ramus intermediate had significant diffuse plaque and was intermyocardial.  The posterolateral branch of the right was too small to graft.  The distal circumflex was a 1.0 mm vessel.  DESCRIPTION OF PROCEDURE:  The patient was brought to the operating room and placed supine on the operating room table, where general anesthesia was induced under vasohemodynamic monitoring.  The chest, abdomen, and legs were prepped with Betadine and draped as a sterile field.  A median sternotomy was performed as the saphenous vein was harvested from the right leg.  It was of average quality.  The left internal mammary artery pedicle was harvested as a pedicle graft from its origin at the subclavian vessels, and was a small vessel, but with excellent flow.  Heparin was administered and the ACT was documented as being therapeutic.  Two pursestrings placed in the ascending aorta and right atrium.  The patient was cannulated and placed on bypass and cooled to 32 degrees.  The coronaries were inspected.  The mammary artery and vein grafts were prepared for the distal anastomoses.  A cardioplegic cannula was placed.  The patient was cooled to 28 degrees.  As the aortic crossclamp was applied,  500 cc of cold blood cardioplegia was delivered to the aortic root, with immediate cardioplegic arrest, and the septal temperature dropping to less than 12 degrees.  Topical iced saline slush was used to augment myocardial  preservation, and a pericardial insulator pad was used to protect the left phrenic nerve.  The distal coronary anastomoses were then performed.  The first distal anastomosis was to the right coronary artery.  This was a sequential vein, first of the distal right, and then continuing to the posterior descending. The distal right coronary had an ostial 70% stenosis, and was a 1.8 mm vessel. A side-to-side anastomosis was performed with a reverse saphenous vein using running #7-0 Prolene, with good flow through the graft.  The second distal anastomosis was the continuous of the sequential vein to the posterior descending.  The posterior descending had severe diffuse disease, and the graft was placed fairly distally in the vessel.  It was a 1.3 mm vessel, and the end of the vein was sewn end-to-side with running #7-0 Prolene, and there was good flow through the sequential graft.  The third and fourth distal anastomoses consisted of a sequential vein to the ramus intermediate, continuing the obtuse marginal.  The ramus intermediate was a 1.5 mm vessel intermyocardial, with diffuse plaquing and a side-to-side anastomosis, with the vein was sewn with running #7-0 Prolene, with good flow through the graft. The fourth distal anastomosis was a continuation of this.  The obtuse marginal, which is a small 1.0 mm vessel with proximal 80% stenosis.  The end of the vein was sewn end-to-side with running #7-0 Prolene.  There was good flow through the sequential vein graft.  Cardioplegia was redosed.  The fifth distal anastomosis was to the distal LAD, which had severe diffuse plaquing and disease.  The left internal mammary coronary artery pedicle was brought through an opening created in the left lateral pericardium.  It was brought down onto the LAD, and sewn end-to-side with a running #8-0 Prolene.  There was excellent flow through the anastomosis, with an immediate rise in septal temperature after the  release of the pedicle clamp on the mammary artery.  The mammary pedicle was secured to the epicardium, and the aortic crossclamp was removed.   The heart resumed a spontaneous rhythm.  A partial occluding clamp was placed on the ascending aorta, and two proximal vein anastomoses were performed using a 4.0 mm punch and running #6-0 Prolene.  The partial occluding clamp was removed and the vein grafts were perfused.  Each had excellent flow, and hemostasis was documented at the proximal and distal sites.  The patient was rewarmed to 37 degrees, and the temporary pacing wires were applied.  When the patient was rewarmed, the ventilator was resumed.  The patient was weaned from cardiopulmonary bypass without inotropes.  Blood pressure and cardiac output were stable.  Protamine was administered.  The cannulas were removed.  The mediastinum was irrigated with warm antibiotic irrigation.  The leg incision was irrigated and closed in the standard fashion.  The pericardium was loosely reapproximated.  Two mediastinal and a left pleural chest tube were placed and brought out through separate incisions.  The sternum was reapproximated with interrupted steel wire in the pectoralis fascia, and subcutaneous layers closed with a running Vicryl.  The skin was closed with a subcuticular, and sterile dressings were applied.  The patient returned to the intensive care unit in stable condition. DD:  06/12/00 TD:  06/13/00 Job: [id number]

## 2011-01-27 NOTE — Cardiovascular Report (Signed)
[place]. [facility]  Patient:    Brenda [name], Brenda [name]                       MRN: [id number] Proc. Date: 06/11/00 Attending:  Charna Elizabeth CC:         Talmadge Coventry, M.D.             CV Laboratory             Demetrius Charity Bud Face, M.D.                        Cardiac Catheterization  INDICATIONS:  Ms. Elicker is well known to me.  She is a 55 year old who recently presented with anterior T wave changes.  There was some haziness of the LAD and a moderate distal lesion.  She also had significant disease of the right coronary.  She was also noted to have an ulcerated plaque in the carotid.  She was treated medically and underwent carotid endarterectomy.  She has continued to have some chest pain and a Cardiolite revealed both an anteroapical and distal inferior defect.  Because of this, I elected to recommend repeat cardiac catheterization.  PROCEDURES: 1. Left heart catheterization. 2. Selective coronary arteriography. 3. Selective left ventriculography. 4. Subclavian angiography.  DESCRIPTION OF PROCEDURE:  The procedure was performed from the right femoral artery using 6 French catheters.  She tolerated the procedure without complication.  HEMODYNAMICS:  The central aortic pressure was 149/77. LV pressure 150/27. There was no gradient on pullback across the aortic valve.  ANGIOGRAPHIC DATA:  LEFT VENTRICULOGRAPHY:  Ventriculography in the RAO projection reveals preserved overall systolic function.  Subclavian angiography reveals a widely patent subclavian and internal mammary.  The left main coronary artery is free of critical disease.  The left anterior descending artery has luminal irregularities.  There is a n optional diagonal.  In the midportion of the vessel there is eccentric stenosis that measures about 50% in luminal reduction.  More distally beyond the third diagonal was an 80% stenosis that is eccentric.  There is a 90% third diagonal  stenosis.  There is faint filling of the distal right coronary from the left injections.  There is a ramus intermedius that has about 50% to 70% ostial narrowing. There is a 50% mid stenosis.  The AV circumflex has about a 50% ostial stenosis.  The right coronary artery had some damping on engagement.  There was probably about 70% ostial narrowing.  There is a 90% diffuse narrowing of the PDA. Posterolateral branch has a 50% stenosis.  CONCLUSIONS: 1. Normal left ventricular function. 2. Patent subclavian. 3. Three-vessel coronary artery disease with progression of disease in the    left anterior descending.  DISPOSITION:  Based upon the above findings I think it would probably be optimal to recommend revascularization surgery.  The LAD has a high target vessel revascularization rate and the RCA is diffusely diseased and not very favorable for percutaneous intervention.  A surgical consult will be obtained. DD:  06/11/00 TD:  06/11/00 Job: [id number]

## 2011-01-27 NOTE — Assessment & Plan Note (Signed)
[facility]                              CARDIOLOGY OFFICE NOTE   MERRY, POND                         MRN:          [id number]  DATE:06/24/2006                            DOB:          [birth year]    HISTORY OF PRESENT ILLNESS:  Ms. Crouse is in for followup visit. To briefly  summarize, this lady is without ongoing chest pain. She feels relatively  well although she has run out of some of her medicines. She cannot get any  medicines until the 12th of the month. This includes her Benicar, which has  been used for her hypertension. She apparently had a lot of lab work done at  Dr. Rinaldo Cloud office and she has been called by them and told that her thyroid  situation is okay.   PHYSICAL EXAMINATION:  VITAL SIGNS:  Blood pressure 165/90, pulse 70.  LUNGS:  Fields clear.  HEART:  Cardiac rhythm is regular. I cannot appreciate a significant carotid  bruit, although  she has a large left carotid endarterectomy scar.  EXTREMITIES:  No edema.   LABORATORY DATA:  Her echocardiogram today was reviewed in detail. She does  not have enlarged right sided structures. There is a question of an atrial  septal aneurysm. She is on dual anti-platelet therapy. Overall, left  ventricular function is preserved.   IMPRESSION:  1. Coronary artery disease status post coronary artery bypass grafting      surgery.  2. Status post carotid endarterectomy.  3. Hypertension, difficult to control, as patient has not been taking her      medications.  4. Hyperthyroidism, managed by Dr. Evlyn Kanner.   PLAN:  1. Continue Plavix and decrease aspirin to 81 mg daily.  2. Samples of Benicar HCT given and resume.  3. Followup with the liver profile with Dr. Evlyn Kanner.  4. Reinforcement of all the above.  5. Carotid Doppler's as this has not been done since the time of her      surgery.  6. Return to the clinic in 4 months.       Arturo Morton. Riley Kill, MD, [facility]    TDS/MedQ  DD:   06/14/2006  DT:  06/15/2006  Job #:  [id number]

## 2011-01-27 NOTE — Assessment & Plan Note (Signed)
[facility]                              CARDIOLOGY OFFICE NOTE   KYLEY, SOLOW                         MRN:          [id number]  DATE:05/09/2006                            DOB:          [birth year]    Katria comes in for a followup visit.  It sounds as though her social  situation has continued to be somewhat of a challenge.  It appears as though  her daughter is living with her now, and has a baby.  Since I saw her last,  she ran out of her Toprol, and did not get the prescription renewed.  She  was also out of her Lexapro, and has been out of her Tapazole.  She is now  on Paxil, but has had some difficulty affording her medications.  She has  not had a followup with Dr. Evlyn Kanner.  She called there office for a Tapazole  prescription, but did have some trouble getting through, and became  frustrated and quit the process.  She and I had an extensive discussion  about this today in detail, and I once again reemphasized all the importance  of her thyroid medication.  She has a long history of Graves disease.  She  no longer is taking her beta blocker as well.   PHYSICAL EXAMINATION:  The weight was 170 pounds, the blood pressure 151/86,  and the pulse is 80.  LUNGS:  The lung fields are clear to auscultation and percussion.  CARDIAC:  Rhythm is regular with a well-healed median sternotomy.  EXTREMITIES:  Do not reveal significant edema.   The electrocardiogram demonstrates normal sinus rhythm.  There is a  prominent inferior P wave, although it is not extraordinarily large.  This  suggests right atrial enlargement, and there is some T wave inversion in the  anterior precordial leads.  Of note, the patient does have a prior T wave  inversion in the anterior precordial leads, and previously had  echocardiography to evaluate her right atrial enlargement in the past.   Ms Mcmonigle and I had a long discussion today about her medications.  My major  concern is that this has been a recurring issue.  She intermittently stops  her thyroid medication, and she also stops her beta blockade.  I have taken  the liberty of restarting her on metoprolol 25 mg twice daily.  I think what  we will do is go ahead and get a 2D echocardiogram to reassess her right-  sided chambers.  I have asked her to make sure she calls Dr. Rinaldo Cloud office  in the next 24 hours to get her thyroid medication, and to get back in to be  seen.  In addition, I will have our nursing staff check on the patient to  ensure that she in fact has done this.  We will see her back in followup in  about 4 weeks.                              Arturo Morton. Riley Kill,  MD, [facility]    TDS/MedQ  DD:  05/12/2006  DT:  05/13/2006  Job #:  [id number]   cc:   Jeannett Senior A. Evlyn Kanner, MD

## 2011-01-27 NOTE — Consult Note (Signed)
[place]. [address] [facility]  Patient:    Brenda [name], Brenda [name]                       MRN: [id number] Proc. Date: 06/11/00 Attending:  Charna Elizabeth CC:         [facility] Office             Arturo Morton. Riley Kill, M.D. [facility]                          Consultation Report  REASON FOR CONSULTATION:  Severe three-vessel coronary disease with class 3 angina.  CHIEF COMPLAINT:  Chest pain.  REFERRING PHYSICIAN:  Arturo Morton. Riley Kill, M.D.  PRIMARY CARE PHYSICIAN:  Talmadge Coventry, M.D.  HISTORY OF PRESENT ILLNESS:  Brenda [name] is a 55 year old black female with known history of coronary artery disease status post prior cardiac catheterization in August of this year when she presented with chest pain. She was found to have moderate LAD and right coronary disease and at that time was found to have a significant left carotid stenosis and underwent uncomplicated left carotid endarterectomy by Dr. Liliane Bade.  She persisted in having exertional chest pain and postprandial chest pain and underwent further cardiac evaluation in September including an exercise tolerance test and a stress Cardiolite.  These both indicated anterior ischemia, and a followup cardiac catheterization was scheduled as an outpatient today for possible percutaneous intervention.  Of note, the patient has had H. pylori gastric infection and gastric ulcer disease; however, this was treated, and a followup endoscopy in September of this year demonstrated resolution of her peptic ulcer disease.  Dr. Riley Kill performed left heart catheterization today which demonstrated significant three-vessel coronary disease with coronary lesions not amenable to percutaneous angioplasty and stenting, and she was referred for surgical revascularization.  Her overall left ventricular function is well-preserved.  She does have distal disease in the LAD and right coronaries and moderate 50-70% stenosis of the ramus intermedius.  She is  now pain-free in the hospital following cardiac catheterization for surgical evaluation for CABG.  PAST MEDICAL HISTORY: 1. Known three-vessel coronary artery disease. 2. Left breast cancer treated with lumpectomy, chemotherapy, and radiation    therapy in 1999. 3. Graves disease treated medically. 4. Status post left carotid endarterectomy, April 27, 2000. 5. Remote history of smoking.  Quit in August of this year.  CURRENT MEDICATIONS:  Tapazole 2.5 mg q day, Altace 2.5 mg b.i.d., Imdur 30 mg p.o. b.i.d., Pepcid 20 mg b.i.d., aspirin 325 mg q day, Plavix 75 mg p.o. q day, Zocor 20 mg p.o. q.h.s., Toprol XL 50 mg p.o. q day, Nexium 40 mg p.o. q day, Mylanta p.r.n., and nitroglycerin 0.4 mg sublingual p.r.n. chest pain.  ALLERGIES:  None.  SOCIAL HISTORY:  The patient is a IT trainer, Child psychotherapist, and received her college degree.  She is a single mother, and her daughter is 70 years old in sixth grade.  She quit smoking in August and does not use alcohol significantly.  FAMILY HISTORY:  The patients mother died at an early age of carcinoma of the lung.  There is no history of premature coronary artery disease in her family.  REVIEW OF SYSTEMS:  The patient denies any recent weight change or fever.  She denies any symptoms of TIA or CVA.  She denies DVT, claudication, or peripheral vascular disease.  She has had no recent abdominal symptoms, and she denies any history  of asthma, hemoptysis, or pneumonia.  She denies any free bleeding, skin lesion.  She denies depression, insomnia, or diminished appetite.  Review of systems is otherwise unremarkable.  PHYSICAL EXAMINATION:  She is five feet, five inches, and weighs 155 pounds.  VITAL SIGNS:  Blood pressure is 140/60.  Heart rate is 88 and regular in sinus rhythm, and she is anxious but with no chest pain in her hospital room following cardiac catheterization.  HEENT:  Normocephalic.  Full EOMs.  Clear pharynx.  Dentition  under good repair.  NECK:  Supple without JVD, thyromegaly, or bruit.  She has a well-healed left carotid endarterectomy incision.  LUNGS:  Clear to auscultation bilaterally.  CARDIAC:  Regular rate and rhythm without S3, gallop, murmur, or rub.  ABDOMEN:  Soft, nontender, without mass or organomegaly or abdominal bruit.  EXTREMITIES:  With full range of motion without swollen, tender joints.  NEUROLOGIC:  Alert and oriented x 3 with full motor function.  SKIN:  Lymphatics reveal no palpable adenopathy in the cervical, supraclavicular, or axillary areas, and her skin is warm, clear, and dry without rash or skin lesions.  LABORATORY DATA:  Her preoperative labs show a white count of 6.5, hematocrit of 34.  Platelet count 233,000.  BUN 15, creatinine 0.5, glucose 110, LDL cholesterol 69, HDL cholesterol 42, total cholesterol 125.  Chest x-ray shows no active disease, and her most recent carotid duplex shows no significant obstructive lesions in the carotid circulation bilaterally.  IMPRESSION AND PLAN:  A 55 year old black female with severe coronary disease with diffuse disease as well as distal involvement which makes her a non-candidate for angioplasty.  She is a suboptimal candidate for bypass surgery due to her small vessels and distal disease; however, this will provide her the best option for preservation of ventricular function, relief of symptoms, and improved long-term survival.  I discussed the coronary bypass operation with the patient and her family.  We discussed the indications and expected benefits of the operation as well as the placement of the surgical incisions, the choice of conduit, the use of cardiopulmonary bypass and general anesthesia, and the expected recovery.  I reviewed the risks associated with this operation including the risks of MI, CV, bleeding, infection, and death.  She understands these implications, the alternatives to surgery, and agrees to  proceed with the operation as planned under informed consent. DD:  06/11/00 TD:  06/11/00 Job: [id number]  [id number]

## 2011-01-27 NOTE — Discharge Summary (Signed)
[place]. [facility]  Patient:    Brenda [name], Brenda [name]                         MRN: [id number] Adm. Date:  04/23/00 Disc. Date: 04/29/00 Attending:  Nathen [name], M.D., Brenda [name] [facility] Dictator:   Brenda [name], P.A. CC:         Brenda [name], M.D.  Brenda [name], M.D.   Discharge Summary  PROCEDURES  1. Cardiac catheterization, April 24, 2000.  2. Carotid endarterectomy, April 26, 2000.  REASON FOR ADMISSION:  Brenda [name] is a 55 year old female with no previous history of heart disease, who presented with progressive chest discomfort, radiating to both upper extremities and associated with dyspnea.  She had recently undergone evaluation for peptic ulcer disease and was found to be H. pylori positive.  On day of admission, she initially presented to urgent care where she was found to have an abnormal EKG and presence of a carotid bruit.  She was referred to [facility] for further diagnostic evaluation.  LABORATORY AND X-RAY FINDINGS:  Cardiac enzymes:  CPK-MB negative x 3, troponin I 0.05. Metabolic profile within normal limits.  Lipid profile: Cholesterol 200, triglyceride 83, HDL 48, LDL 135, cholesterol/HDL ratio 4.2. TSH 0.48, free T4 normal at 1.51/free T3 normal at 3.1.  WBC 10,500, hemoglobin 12.3, hematocrit 35.6, platelets 208,000 on admission.  INR 1.1. Sodium 139, potassium 3.8, glucose 100, BUN 13, creatinine 1.2 on admission.  CXR:  NAD.  HOSPITAL COURSE:  Following admission, patient ruled out for MI with negative serial cardiac enzymes.  She was placed on intravenous heparin, aspirin and beta blocker, with plans to proceed with diagnostic coronary angiography. Serial EKGs were indicative of anterior ischemia.  Cardiac catheterization, performed on April 24, 2000 by Dr. Arturo [name]. Brenda [name] (see cath report for full details) revealed three-vessel CAD, notable for subtotal occlusion of PDA with slow flow in collaterals and question of a  hypodense lesion (60-70%) of the mid LAD; additionally, there was 50-70% calcified ostial RCA; 50% proximal CFX; 50% mid RI; 70% ostial DX-2.  Left ventriculogram revealed mild left ventricular dysfunction (EF 48%) with distal inferior hypokinesis.  Dr. Riley [name], following extensive review of the angiogram, recommended proceeding with medical therapy and risk factor modification -- patient was instructed to not resume smoking.  Dr. Riley [name] favored the addition of nitrates and Plavix and noted that the patient might be a candidate for the PROVE-IT trial.  He also checked a lipid profile and started the patient on Zocor 20 mg q.d.  Patient underwent subsequent evaluation of the left carotid bruit and was found to have a greater than 80% proximal LICA stenosis; no significant RICA stenosis was noted.  Patient was referred to Dr. Denman [name], who proceeded with successful carotid endarterectomy on April 27, 2000.  No complications were noted and patient was cleared for discharge on postoperative day #2.  Patient was also evaluated for abnormally low TSH with normal free T3/free T4 levels.  She was seen by Dr. Fritzi [name], who noted that patient had been previously diagnosed with Graves disease, having been treated with Tapazole, August 1999 -- April 2000.  He noted that the patient was currently clinically euthyroid with no symptoms suggestive of thyrotoxicosis.  He resumed Tapazole at 2.5 mg q.d. and plans are to repeat thyroid studies in one week and again in two months.  MEDICATIONS AT DISCHARGE  1. Tapazole 2.5 mg q.d.  2.  Altace 2.5 mg b.i.d.  3. Imdur 30 mg q.d.  4. Pepcid 20 mg b.i.d.  5. Coated aspirin 325 mg q.d.  6. Plavix 75 mg q.d.  7. Zocor 20 mg q.h.s.  8. Toprol XL 15 mg q.d.  9. Percocet 5/325 mg as directed. 10. Nitrostat 0.4 mg as directed.  ACTIVITY:  Patient is to refrain from any strenuous activity, heavy lifting, driving x 2 days.  DIET:   Maintain low-fat/-cholesterol diet.  SPECIAL INSTRUCTIONS:  Call the office if there is any swelling/bleeding of the groin.  FOLLOWUP:  Patient is to return for cardiac followup with Dr. Bonnee [name] in the following 7-10 days; cardiology office will call with the scheduled appointment.  Patient is instructed to follow up with Dr. Amada [name] in two weeks for surgical followup; the CVTS office will call with that appointment. Patient is also instructed to arrange for followup with Dr. Lonell [name] in one week as outlined.  She will need followup thyroid studies in one week and again in two months.  DISCHARGE DIAGNOSES  1. Ischemic heart disease.     a. Cardiac catheterization, April 24, 2000:  Three-vessel coronary artery        disease, mild left ventricular dysfunction -- medical therapy        recommended.     b. Negative serial cardiac enzymes.  2. Cerebrovascular disease.     a. Status post left carotid endarterectomy, April 26, 2000.  3. Recurrent hyperthyroidism.     a. Clinically euthyroid.     b. Tapazole resumed.  4. Tobacco smoking.  5. Hyperlipidemia, hydroxymethyl glutaryl coenzyme A reductase inhibitor     therapy initiated.  6. Peptic ulcer disease. DD:  04/29/00 TD:  04/30/00 Job: [id number]

## 2011-01-27 NOTE — Procedures (Signed)
[place]. [facility]  Patient:    Brenda [name], Brenda [name]                       MRN: [id number] Proc. Date: 05/24/00 Adm. Date:  [id number] Attending:  Charna Elizabeth CC:         Paulino Rily, M.D.   Procedure Report  PROCEDURE PERFORMED:  Esophagogastroduodenoscopy.  ENDOSCOPIST:  Anselmo Rod, M.D.  INSTRUMENT USED:  Olympus video panendoscope.  INDICATIONS FOR PROCEDURE:  Epigastric pain and guaiac-positive stool in a 55 year old female, with a personal history of breast cancer.   Rule out peptic ulcer disease, esophagitis, gastritis, etc.  PREPROCEDURE PREPARATION:  Informed consent was procured from the patient. The patient was fasted for eight hours prior to the procedure.  PREPROCEDURE PHYSICAL:  VITAL SIGNS:  The patient has stable vital signs.  NECK:  Supple.  LUNGS:  Chest was clear to auscultation.  CARDIAC:  S1, S2 is regular.  No murmurs, rubs, or gallops.  ABDOMEN:  Abdomen soft with normal abdominal bowel sounds.  DESCRIPTION OF PROCEDURE:  The patient was placed in the left lateral decubitus position, and sedated with 50 mg of Demerol and 5 mg of Versed intravenously.  Once the patient was adequately sedated and maintained on low-flow oxygen and continuous cardiac monitoring, the Olympus video panendoscope was advanced through the mouthpiece over the tongue and into the esophagus.  Under direct vision the patients entire esophagus appeared normal without evidence of ring, stricture, masses, lesions, esophagitis or Barretts mucosa.  The scope was then advanced into the stomach.  The entire gastric mucosa appeared healthy, as well as the proximal small bowel.  IMPRESSION:  Normal EGD.  RECOMMENDATIONS:  Proceed with colonoscopy at this time. DD:  06/06/00 TD:  06/07/00 Job: [id number]

## 2011-01-27 NOTE — Cardiovascular Report (Signed)
[place]. [facility]  Patient:    Brenda [name], Brenda [name]                       MRN: [id number] Proc. Date: 04/24/00 Attending:  Charna Elizabeth CC:         Talmadge Coventry, M.D.             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  She has a significant prior history of breast cancer in 1999, treated with chemotherapy, radiation and surgery; she also has a history of hyperthyroidism.  The current study was done to assess coronary anatomy.  PROCEDURE 1. Left heart catheterization. 2. Selective coronary arteriography. 3. Selective left ventriculography. 4. Injection of intracoronary nitroglycerin.  DESCRIPTION OF PROCEDURE:  The procedure was performed from the left femoral artery.  We initially tried starting from the right femoral artery but we had difficulty entering the right femoral artery, including using a Smart needle. There was a small scar above this from her previous hysterectomy, perhaps altering the anatomy.  The left femoral artery was easily palpable and was entered on a very easy anterior puncture.  There were no complications.  The patient did have significant hypertension after the procedure due to anxiety and she was given doses of Lopressor intravenously x 2.  She was taken to the holding area.  HEMODYNAMIC DATA:  Initial central aortic pressure 162/90.  LV pressure 175/24.  There was no gradient on pullback across the aortic valve.  ANGIOGRAPHIC DATA:  Ventriculography performed in the RAO projection reveals distal inferior wall hypokinesis.  This involves the near apical tip.  The ejection fraction was calculated at 48%.  There was catheter-induced mitral regurgitation but this was not felt to be significant.  On  plain fluoroscopy, there was calcification of the LAD and the ostium of the right coronary.  The left main coronary artery was free of critical disease.  The left anterior descending artery has multiple areas of mild luminal irregularity.  There is a hypodense 60-70% area of stenosis in the mid-vessel; this is best seen on the left lateral view.  In the LAO cranial view, it appears somewhat hypodense; however, there does appear to be an adequate lumen.  There is about a 50% area of stenosis distally past the third diagonal, with the third diagonal having about a 70% ostial stenosis.  The distal vessel wraps the apex.  There is a ramus intermedius vessel that has segmental plaquing of about 40-50% in the midportion.  The A-V circumflex has about 50% narrowing at the ostium as well as about a 70% distal stenosis.  The right coronary artery demonstrates calcification proximally.  There was some damping of the catheter, even with intracoronary nitroglycerin.  There appeared to be about 50-70% ostial stenosis followed by about a 30% mid stenosis.  There was severe disease of the posterior descending branch.  There was a 99% stenosis followed by TIMI-2 flow and there appeared to be a second 90% stenosis in this vessel.  There was a flush-fill phenomenon distally, suggesting collateral filling from the LAD.  On  the LAD injections, there was some evidence of collateral filling of the distal PDA.  The posterolateral system is small, somewhat irregular, but without critical narrowing.  CONCLUSIONS 1. Mild reduction in left ventricular function with a distal inferior wall    motion abnormality. 2. Subtotal occlusion of the mid-posterior descending artery with a vessel    that appears to be about 2 mm in size and is diffusely diseased, with some    collateral filling from the left coronary system. 3. Hypodensity in the midportion of the left anterior descending artery with    60-70%  stenosis.  DISPOSITION:  The patient has EKG changes with some anterior changes as well as inferior changes.  There is a distal inferior apical wall motion abnormality suggesting that the PDA is the source of the findings; however, we would be concerned about all of these.  We would consider increasing her beta blockers, adding nitrates and adding possibly Plavix to her regimen. Certainly, we will treat the hypercholesterolemia.  We will see her back in followup closely in the office with Dr. Nathen May. DD:  04/24/00 TD:  04/24/00 Job: [id number]

## 2011-01-27 NOTE — Discharge Summary (Signed)
[place]. [facility]  Patient:    Brenda [name], Brenda [name]                       MRN: [id number] Adm. Date:  [id number] Disc. Date: [id number] Attending:  Mikey Bussing Dictator:   Lissa Hoard, P.A.C.                           Discharge Summary  DATE OF BIRTH:  [birth year].  ADMITTING DIAGNOSIS:  Coronary artery disease.  DISCHARGE DIAGNOSES: 1. Coronary artery disease. 2. Status post coronary artery bypass grafting.  HISTORY OF PRESENT ILLNESS:  Mrs. Antonio is a 55 year old African-American female with a known history of coronary artery disease, status post cardiac catheterization in April 15, 2000 when she presented with chest pain.  She was found to have moderate LAD and right coronary artery disease at that time.  She persisted in having additional chest pain and postprandial chest pain and underwent a further cardiac evaluation in September including exercise tolerance test and stress Cardiolite.  Both of these tests indicate an anterior ischemia.  Follow-up cardiac catheterization was scheduled as an outpatient today for possible percutaneous intervention.  This cardiac catheterization showed that the patient did have distal disease in the left anterior descending coronary artery and right coronaries and moderate 50-70% stenosis in the ramus intermedius.  The patient was found to be pain-free in the hospital following cardiac catheterization.  Because of these cardiac catheterization findings, Dr. Kathlee Nations Trigt III was consulted for possible surgical intervention.  PHYSICAL EXAMINATION:  VITAL SIGNS:  The patient was found to have a blood pressure of 140/60, heart rate of 88 and regular, sinus rhythm.  HEENT:  Grossly within normal limits.  NECK:  Supple without JVD, thyromegaly, lymphadenopathy, and bruits.  LUNGS:  Clear to auscultation bilaterally.  CARDIOVASCULAR:  Regular rate and rhythm without murmurs, rubs, or gallops.  ABDOMEN:   Soft and nontender without masses or organomegaly.  Positive bowel sounds were present in all four quadrants.  EXTREMITIES:  Full range of motion without any edema.  NEUROLOGICAL:  Grossly intact.  HOSPITAL COURSE:  As stated earlier, the patient underwent a cardiac catheterization on June 11, 2000 with the findings as noted before.  The patient was admitted with the following baseline labs, hemoglobin and hematocrit of 11.1 and 30.6, white blood cell count of 7.1.  Sodium and potassium were found to be 137 and 3.7.  BUN and creatinine 13 and 0.6.  On June 12, 2000, the patient underwent coronary artery bypass grafting x 5. The left internal mammary artery was anastomosed to the left anterior descending coronary artery.  The sequential saphenous vein grafts were anastomosed to the right coronary artery and the posterior descending artery and a sequential saphenous vein graft was anastomosed to the ramus intermedius and the obtuse marginal.  The procedure was done by Dr. Kathlee Nations Trigt III under general endotracheal anesthesia with cardiopulmonary bypass.  The patient tolerated the procedure well without any complications.  The patient was transported to the SICU after the operation in stable condition. She did receive one unit of packed red blood cells intraoperatively.  Later on in the operative day, the patient had another cardiac index of 2.93 and was intubated.  Her potassium at this time was 3.8 which was repleted.  Postoperative day #1, the patient was found to have a pulse oximetry of 96% on room air,  off all oxygen therapy, and not requiring any the remainder of her hospital stay.  She was in normal sinus rhythm at this time.  Hemoglobin and hematocrit was 8.9 and 25.3.  Potassium, which was repleted, was now 4.3.  Her creatinine was 0.5 which remained stable the remainder of her hospital stay. She was transferred out of the SICU at this time.  Postoperative day #2,  the patient was again not requiring any oxygen therapy. Her chest tubes were discontinued secondary to low output.  Hemoglobin and hematocrit were 8.1 and 23.7.  She was diuresing well.  The patient did, however, have several periods of rapid atrial fibrillation which was controlled with digoxin and Lopressor.  Postoperative day #3, the patient was found to be in normal sinus rhythm.  Hem and hematocrit at this time were stable at 8.4 and 24.5.  Postoperative day #4, the patient was again normal sinus rhythm and not requiring any oxygen therapy.  She has mobilized well since being transferred out of the SICU.  Anticipated discharge is scheduled for June 17, 2000.  The patient will be discharged on the following medications.  DISCHARGE MEDICATIONS: 1. Darvocet-N 100 1-2 tablets q.4-6h. p.r.n. pain. 2. Digoxin 0.25 mg p.o. q.d. 3. Toprol XL 50 mg 1 tablet p.o. q.d. 4. Aspirin 325 mg 1 tablet p.o. q.d. 5. Niferex 150 mg 1 tablet p.o. q.d. 6. The patient was also instructed to resume all her previous home    medications.  ACTIVITY:  The patient is instructed no driving, lifting objects over 10 pounds, or strenuous activity.  DIET:  The patient is instructed to resume low fat, low cholesterol diet.  WOUND CARE:  The patient was instructed to cleanse wounds with mild soap and water; however, she is not to take any tub baths.  DISPOSITION:  To home.  FOLLOW-UP:  The patient was discharged to call her cardiologist at [facility] for appointment in two weeks.  She was instructed that she will have a chest x-ray taken by cardiologist at this time and she is to bring this x-ray back to Dr. Kathlee Nations Trigts office on follow-up.  The patient was instructed to follow up with Dr. Kathlee Nations Trigt in three weeks.  CVTS office will call her for an appointment in three weeks. DD:  06/16/00 TD:  06/18/00 Job: [id number]

## 2011-01-27 NOTE — Op Note (Signed)
[place]. [facility]  Patient:    Brenda [name], Brenda [name]                       MRN: [id number] Proc. Date: 04/27/00 Attending:  Charna Elizabeth CC:         Cecil Cranker, M.D. [facility]  [name], M.D.   Operative Report  PREOPERATIVE DIAGNOSIS:  Severe left internal carotid artery stenosis.  POSTOPERATIVE DIAGNOSIS:  Severe left internal carotid artery stenosis.  OPERATION PERFORMED:  Left carotid endarterectomy with Dacron patch angioplasty.  SURGEON:  Denman George, M.D.  ASSISTANT:  Areta Haber, P.A.  ANESTHESIA:  General endotracheal.  ANESTHESIOLOGIST:  Dr. Katrinka Blazing.  INDICATIONS FOR PROCEDURE:  This is a 55 year old female admitted to [place]. [facility] with chest pain.  She was noted to have a left carotid bruit.  Work-up for this including Doppler evaluation revealed evidence of severe left internal carotid artery stenosis.  The patient denies history of stroke or transient ischemic symptoms.  It was recommended that the patient undergo carotid endarterectomy for reduction of stroke risk.  She consented for this.  The risks and benefits of the operative procedure were explained in detail with an operative risk of approximately 1 to 2% to include but not limited to MI, CVA and death.  DESCRIPTION OF PROCEDURE:  The patient was brought to the operating room in stable condition.  Placed in supine position.  General endotracheal anesthesia induced.  Foley catheter and arterial line inserted.  The left neck prepped and draped in sterile fashion.  A curvilinear skin incision was made along the anterior border of the left sternomastoid muscle. Incision extended deeply through the subcutaneous tissues.  Dissection carried down through the platysma with electrocautery.  The sternomastoid muscle was reflected posteriorly.  The carotid sheath exposed.  The common carotid artery mobilized proximally and encircled with a vessel loop.   The vagus nerve reflected posteriorly and preserved.  The carotid bifurcation exposed and the superior thyroid and external carotid artery encircled with fine vessel loops. The internal carotid artery was then followed distally up to the posterior belly of the digastric muscle.  The patient was then administered 6000 units of heparin intravenously. Adequate circulation time permitted.  The carotid vessel was controlled with clamps.  Longitudinal arteriotomy made in the distal common carotid artery. The arteriotomy extended across the carotid bulb and up into the internal carotid artery.  There was calcified plaque present with a 90% left internal carotid artery stenosis.  A shunt was inserted.  Plaque removed with an endarterectomy elevator.  Proximally the plaque was divided transversely with Potts scissors in the common carotid artery.  The superior thyroid and external carotid artery were endarterectomized using an eversion technique. The internal carotid plaque feathered out well distally.  Fragments of plaque removed with plaque forceps.  The site irrigated with heparin saline solution.  A preclotted knitted Dacron patch was then placed over the endarterectomy segment with running 6-0 Prolene suture.  The shunt was then removed.  All vessels were well flushed.  Clamps were removed directing initial antegrade flow up the external carotid artery, following this, the internal carotid artery was released.  Patient administered 30 mg of protamine intravenously.  Adequate hemostasis obtained.  Sponge and instrument counts were correct.  Sternomastoid fascia closed with running 2-0 Vicryl suture.  Platysma closed with running 3-0 Vicryl suture.  Skin closed with staples.  Sterile dressings were applied.  Anesthesia  reversed in the operating room.  Patient awakened readily, moved all extremities to command.  Transferred to recovery room in stable condition. DD:  08/29/00 TD:   08/30/00 Job: [id number]

## 2011-02-01 ENCOUNTER — Ambulatory Visit (INDEPENDENT_AMBULATORY_CARE_PROVIDER_SITE_OTHER): Payer: Medicare Other | Admitting: Cardiology

## 2011-02-01 ENCOUNTER — Telehealth: Payer: Self-pay | Admitting: Cardiology

## 2011-02-01 ENCOUNTER — Encounter: Payer: Self-pay | Admitting: Cardiology

## 2011-02-01 VITALS — BP 180/90 | HR 81 | Ht 68.0 in | Wt 165.0 lb

## 2011-02-01 DIAGNOSIS — E7800 Pure hypercholesterolemia, unspecified: Secondary | ICD-10-CM

## 2011-02-01 DIAGNOSIS — I2510 Atherosclerotic heart disease of native coronary artery without angina pectoris: Secondary | ICD-10-CM

## 2011-02-01 DIAGNOSIS — I10 Essential (primary) hypertension: Secondary | ICD-10-CM

## 2011-02-01 MED ORDER — LISINOPRIL-HYDROCHLOROTHIAZIDE 20-25 MG PO TABS: 1.0000 | ORAL_TABLET | Freq: Every day | ORAL | Status: DC

## 2011-02-01 NOTE — Patient Instructions (Signed)
Your physician recommends that you continue on your current medications as directed. Please refer to the Current Medication list given to you today. PLEASE REMEMBER TO TAKE YOUR MEDICATIONS!!!!  Your physician recommends that you schedule a follow-up appointment in: 2 MONTHS

## 2011-02-01 NOTE — Telephone Encounter (Signed)
LMOM for pt. RX sent into pharmacy. [place]

## 2011-02-01 NOTE — Telephone Encounter (Signed)
Pt calling  Re lisinopril 20/25 mg -does not have refill- needs called in [facility] was just in today and forgot

## 2011-02-20 NOTE — Assessment & Plan Note (Signed)
Last cholesterol was nearly 2 years ago.  Her LDL was high. She is on Crestor, but sometimes is on and sometimes is off, so checking levels has been a bit problematic.  She likely will respond if she actually gets the med.

## 2011-02-20 NOTE — Assessment & Plan Note (Signed)
BP was high today, but she ran out of meds last couple of days.  We have been following her closely, if for no other reason than to make sure she stays somewhat in bounds.  If she stops everything, the outcome I predict will be very poor.  As such, we will continue to follow her.

## 2011-02-20 NOTE — Progress Notes (Signed)
HPI:  Ms. Karapetian is in for follow up.  She spent much of the encounter in tears.  She says her memory is not very good.  She forgets to take her medications.  She is worried about her disability.  She has not gotten some of her refills the past couple of days.  Denies chest pain.   Current Outpatient Prescriptions  Medication Sig Dispense Refill  . amLODipine (NORVASC) 5 MG tablet Take 5 mg by mouth daily.        Marland Kitchen aspirin 81 MG tablet Take 81 mg by mouth daily.        . Cholecalciferol (VITAMIN D) 1000 UNITS capsule Take 1,000 Units by mouth daily.        . clonazePAM (KLONOPIN) 0.5 MG tablet Take 0.5 mg by mouth 2 (two) times daily as needed.        Marland Kitchen escitalopram (LEXAPRO) 10 MG tablet Take 10 mg by mouth. As needed      . metoprolol (TOPROL-XL) 50 MG 24 hr tablet Take 50 mg by mouth daily.        . Multiple Vitamin (MULTIVITAMIN) tablet Take 1 tablet by mouth daily.        . nitroGLYCERIN (NITROSTAT) 0.4 MG SL tablet Place 0.4 mg under the tongue every 5 (five) minutes as needed.        . potassium chloride (K-DUR,KLOR-CON) 10 MEQ tablet Take 10 mEq by mouth daily.        . rosuvastatin (CRESTOR) 10 MG tablet Take 10 mg by mouth daily.        Marland Kitchen lisinopril-hydrochlorothiazide (PRINZIDE,ZESTORETIC) 20-25 MG per tablet Take 1 tablet by mouth daily.  30 tablet  10  . methimazole (TAPAZOLE) 10 MG tablet Take 10 mg by mouth 3 (three) times daily.          No Known Allergies  Past Medical History  Diagnosis Date  . Coronary artery disease   . Hypercholesteremia   . Hypertension   . Hyperthyroidism   . Depression   . Carotid artery stenosis     Past Surgical History  Procedure Date  . Coronary artery bypass graft 2001    x 5 (left internal mammary artery to left anterior  descending coronary artery  . Breast lumpectomy 1999  . Carotid endarterectomy 04-2000    left    Family History  Problem Relation Age of Onset  . Lung cancer Mother     died at an early age  . Other     There is no Hx of premature coronary artery disease in her family    History   Social History  . Marital Status: Single    Spouse Name: N/A    Number of Children: 1  . Years of Education: N/A   Occupational History  . Social worker    Social History Main Topics  . Smoking status: Former Smoker    Types: Cigarettes    Quit date: 09/11/2001  . Smokeless tobacco: Not on file  . Alcohol Use: No  . Drug Use: No  . Sexually Active: Not on file   Other Topics Concern  . Not on file   Social History Narrative  . No narrative on file    ROS: Please see the HPI.  All other systems reviewed and negative.  PHYSICAL EXAM:  BP 180/90  Pulse 81  Ht 5\' 8"  (1.727 m)  Wt 165 lb (74.844 kg)  BMI 25.09 kg/m2  General: Well developed, well nourished, in  no acute distress. Head:  Normocephalic and atraumatic. Neck: no JVD Lungs: Clear to auscultation and percussion. Heart: Normal S1 and S2.  No murmur, rubs or gallops.  Abdomen:  Normal bowel sounds; soft; non tender; no organomegaly Pulses: Pulses normal in all 4 extremities. Extremities: No clubbing or cyanosis. No edema. Neurologic: Alert and oriented x 3.  EKG:  ASSESSMENT AND PLAN:

## 2011-03-02 DIAGNOSIS — F331 Major depressive disorder, recurrent, moderate: Secondary | ICD-10-CM

## 2011-03-02 DIAGNOSIS — R413 Other amnesia: Secondary | ICD-10-CM

## 2011-04-02 ENCOUNTER — Other Ambulatory Visit: Payer: Self-pay | Admitting: Cardiology

## 2011-04-04 ENCOUNTER — Ambulatory Visit: Payer: Medicare Other | Admitting: Cardiology

## 2011-08-02 ENCOUNTER — Other Ambulatory Visit: Payer: Self-pay | Admitting: Cardiology

## 2011-08-02 DIAGNOSIS — I6529 Occlusion and stenosis of unspecified carotid artery: Secondary | ICD-10-CM

## 2011-08-07 ENCOUNTER — Encounter (INDEPENDENT_AMBULATORY_CARE_PROVIDER_SITE_OTHER): Payer: Medicare Other | Admitting: *Deleted

## 2011-08-07 DIAGNOSIS — I6529 Occlusion and stenosis of unspecified carotid artery: Secondary | ICD-10-CM

## 2011-08-23 ENCOUNTER — Other Ambulatory Visit: Payer: Self-pay | Admitting: Cardiology

## 2011-08-23 ENCOUNTER — Other Ambulatory Visit: Payer: Self-pay | Admitting: Physician Assistant

## 2011-10-04 ENCOUNTER — Encounter: Payer: Self-pay | Admitting: Cardiology

## 2011-10-04 ENCOUNTER — Ambulatory Visit (INDEPENDENT_AMBULATORY_CARE_PROVIDER_SITE_OTHER): Payer: Medicare Other | Admitting: Cardiology

## 2011-10-04 VITALS — BP 170/85 | HR 88 | Ht 65.0 in | Wt 167.0 lb

## 2011-10-04 DIAGNOSIS — F329 Major depressive disorder, single episode, unspecified: Secondary | ICD-10-CM

## 2011-10-04 DIAGNOSIS — I10 Essential (primary) hypertension: Secondary | ICD-10-CM

## 2011-10-04 DIAGNOSIS — F3289 Other specified depressive episodes: Secondary | ICD-10-CM

## 2011-10-04 DIAGNOSIS — I6529 Occlusion and stenosis of unspecified carotid artery: Secondary | ICD-10-CM

## 2011-10-04 DIAGNOSIS — E7800 Pure hypercholesterolemia, unspecified: Secondary | ICD-10-CM

## 2011-10-04 DIAGNOSIS — I2510 Atherosclerotic heart disease of native coronary artery without angina pectoris: Secondary | ICD-10-CM

## 2011-10-04 NOTE — Patient Instructions (Signed)
Your physician recommends that you return for a FASTING LIPID, LIVER and BMP--nothing to eat or drink after midnight, lab opens at 8:30.  Your physician wants you to follow-up in: 6 MONTHS.  You will receive a reminder letter in the mail two months in advance. If you don't receive a letter, please call our office to schedule the follow-up appointment.  Your physician has requested that you regularly monitor and record your blood pressure readings at home. Please use the same machine at the same time of day to check your readings and record them to bring to your follow-up visit. Please check your BP twice a week.  Please call the office if your BP is consistently higher than 140/90.

## 2011-10-08 NOTE — Progress Notes (Signed)
HPI:  She is stable.  Unfortunately, she did not take any of her meds earlier today.  She says she is doing better in general, and she is accompanied by her child today.  She has been followed in endocrine clinic, and apparently her thyroid is under control.  She appears more upbeat today in general.  No current complaints.    Current Outpatient Prescriptions  Medication Sig Dispense Refill  . amLODipine (NORVASC) 5 MG tablet TAKE ONE TABLET BY MOUTH EVERY DAY  30 tablet  4  . aspirin 81 MG tablet Take 81 mg by mouth daily.        Marland Kitchen buPROPion (WELLBUTRIN XL) 150 MG 24 hr tablet 150 mg. 1 tab daily      . Cholecalciferol (VITAMIN D) 1000 UNITS capsule Take 1,000 Units by mouth daily.        . clonazePAM (KLONOPIN) 0.5 MG tablet Take 0.5 mg by mouth 2 (two) times daily as needed.        . CRESTOR 10 MG tablet TAKE ONE TABLET BY MOUTH EVERY DAY  90 each  3  . lisinopril-hydrochlorothiazide (PRINZIDE,ZESTORETIC) 20-25 MG per tablet Take 1 tablet by mouth daily.  30 tablet  10  . methimazole (TAPAZOLE) 10 MG tablet Take 10 mg by mouth 3 (three) times daily.        . metoprolol (TOPROL-XL) 50 MG 24 hr tablet Take 50 mg by mouth daily.        . mirtazapine (REMERON) 15 MG tablet 2 tabs at night      . Multiple Vitamin (MULTIVITAMIN) tablet Take 1 tablet by mouth daily.        . nitroGLYCERIN (NITROSTAT) 0.4 MG SL tablet Place 0.4 mg under the tongue every 5 (five) minutes as needed.        . potassium chloride (K-DUR,KLOR-CON) 10 MEQ tablet Take 10 mEq by mouth daily.          No Known Allergies  Past Medical History  Diagnosis Date  . Coronary artery disease   . Hypercholesteremia   . Hypertension   . Hyperthyroidism   . Depression   . Carotid artery stenosis     Past Surgical History  Procedure Date  . Coronary artery bypass graft 2001    x 5 (left internal mammary artery to left anterior  descending coronary artery  . Breast lumpectomy 1999  . Carotid endarterectomy 04-2000   left    Family History  Problem Relation Age of Onset  . Lung cancer Mother     died at an early age  . Other      There is no Hx of premature coronary artery disease in her family    History   Social History  . Marital Status: Single    Spouse Name: N/A    Number of Children: 1  . Years of Education: N/A   Occupational History  . Social worker    Social History Main Topics  . Smoking status: Former Smoker    Types: Cigarettes    Quit date: 09/11/2001  . Smokeless tobacco: Not on file  . Alcohol Use: No  . Drug Use: No  . Sexually Active: Not on file   Other Topics Concern  . Not on file   Social History Narrative  . No narrative on file    ROS: Please see the HPI.  All other systems reviewed and negative.  PHYSICAL EXAM:  BP 170/85  Pulse 88  Ht 5\' 5"  (  1.651 m)  Wt 75.751 kg (167 lb)  BMI 27.79 kg/m2  General: Well developed, well nourished, in no acute distress. Head:  Normocephalic and atraumatic. Neck: no JVD.  Carotid scar.  Lungs: Clear to auscultation and percussion. Heart: Normal S1 and S2.  No murmur, rubs or gallops.  Abdomen:  Normal bowel sounds; soft; non tender; no organomegaly Pulses: Pulses normal in all 4 extremities. Extremities: No clubbing or cyanosis. No edema. Neurologic: Alert and oriented x 3.  EKG:  NSR.  LVH with repolarization changes.  There are biphasic T wave changes in I AVL, and the precordial leads.  These are more prominent than prior tracing in 2011, but all prior tracings reviewed, and these are similar to 2010, and T waves have been variable in several leads in past.  All are consistent with some findings of LVH.   ASSESSMENT AND PLAN:

## 2011-10-10 ENCOUNTER — Other Ambulatory Visit (INDEPENDENT_AMBULATORY_CARE_PROVIDER_SITE_OTHER): Payer: Medicare Other | Admitting: *Deleted

## 2011-10-10 DIAGNOSIS — I2510 Atherosclerotic heart disease of native coronary artery without angina pectoris: Secondary | ICD-10-CM

## 2011-10-10 LAB — HEPATIC FUNCTION PANEL
ALT: 19 U/L (ref 0–35)
AST: 20 U/L (ref 0–37)
Albumin: 4.2 g/dL (ref 3.5–5.2)
Alkaline Phosphatase: 52 U/L (ref 39–117)
Bilirubin, Direct: 0.1 mg/dL (ref 0.0–0.3)
Total Bilirubin: 1.1 mg/dL (ref 0.3–1.2)
Total Protein: 7.6 g/dL (ref 6.0–8.3)

## 2011-10-10 LAB — LIPID PANEL
Cholesterol: 198 mg/dL (ref 0–200)
HDL: 53.5 mg/dL (ref >39.00)
LDL Cholesterol: 134 mg/dL — ABNORMAL HIGH (ref 0–99)
Total CHOL/HDL Ratio: 4
Triglycerides: 51 mg/dL (ref 0.0–149.0)
VLDL: 10.2 mg/dL (ref 0.0–40.0)

## 2011-10-10 LAB — BASIC METABOLIC PANEL
BUN: 15 mg/dL (ref 6–23)
CO2: 28 mEq/L (ref 19–32)
Calcium: 9.7 mg/dL (ref 8.4–10.5)
Chloride: 101 mEq/L (ref 96–112)
Creatinine, Ser: 0.6 mg/dL (ref 0.4–1.2)
GFR: 122.68 mL/min (ref >60.00)
Glucose, Bld: 99 mg/dL (ref 70–99)
Potassium: 3.8 mEq/L (ref 3.5–5.1)
Sodium: 138 mEq/L (ref 135–145)

## 2011-10-11 ENCOUNTER — Other Ambulatory Visit: Payer: Medicare Other | Admitting: *Deleted

## 2011-10-17 NOTE — Assessment & Plan Note (Signed)
No angina at present.  

## 2011-10-17 NOTE — Assessment & Plan Note (Signed)
Seems more upbeat.

## 2011-10-17 NOTE — Assessment & Plan Note (Signed)
Need to check her lipid and liver profiles.

## 2011-10-17 NOTE — Assessment & Plan Note (Signed)
Her BP is up today, as it is on many visits.  She says it has been better, and did not get meds today.  She is instructed to check at home fairly frequently, and we will go from there.

## 2011-10-17 NOTE — Assessment & Plan Note (Signed)
Prior LCE, and moderate stenosis on right.  Repeat for November 2013.

## 2011-11-10 ENCOUNTER — Telehealth: Payer: Self-pay | Admitting: Cardiology

## 2011-11-10 DIAGNOSIS — E7800 Pure hypercholesterolemia, unspecified: Secondary | ICD-10-CM

## 2011-11-10 NOTE — Telephone Encounter (Signed)
Pt aware of lab results by phone.  The pt will increase Crestor to 20mg  daily and have a lipid and liver profile rechecked on 12/25/11. The pt said she has Crestor 20mg  tablets at home and has been cutting them in half.  The pt will take a whole tablet daily.

## 2011-11-10 NOTE — Telephone Encounter (Signed)
FU Call: pt returning call to speak with Lauren. Please return pt call to discuss further.

## 2011-12-13 ENCOUNTER — Telehealth: Payer: Self-pay | Admitting: Cardiology

## 2011-12-13 NOTE — Telephone Encounter (Signed)
Left message to call back  

## 2011-12-13 NOTE — Telephone Encounter (Signed)
New Problem:     PAtient is going in for some allergy testing and was told that she needed to stop her aspirin 81 MG tablet for three days and her metoprolol (TOPROL-XL) 50 MG 24 hr tablet for two days prior to the testing. She wanted to know if that would be ok.  Please call back.

## 2011-12-14 MED ORDER — ROSUVASTATIN CALCIUM 20 MG PO TABS: 20.0000 mg | ORAL_TABLET | Freq: Every day | ORAL | Status: DC

## 2011-12-14 NOTE — Telephone Encounter (Signed)
Allergy testing is not scheduled at this time.  Dr Jackie Plum, MD is ordering allergy testing. The pt needs approval to hold ASA 3 days prior and Metoprolol 2 days prior to allergy testing. I will discuss this patient with Dr Riley Kill.

## 2011-12-14 NOTE — Telephone Encounter (Signed)
Pt rtn call to lauren from yesterday,pls call [phone]

## 2011-12-14 NOTE — Telephone Encounter (Signed)
Per Dr Riley Kill the pt cannot stop ASA and Metoprolol for allergy testing. I made the pt aware of this information.

## 2011-12-25 ENCOUNTER — Other Ambulatory Visit (INDEPENDENT_AMBULATORY_CARE_PROVIDER_SITE_OTHER): Payer: Medicare Other

## 2011-12-25 DIAGNOSIS — E7800 Pure hypercholesterolemia, unspecified: Secondary | ICD-10-CM

## 2011-12-25 LAB — LIPID PANEL
Cholesterol: 154 mg/dL (ref 0–200)
HDL: 53 mg/dL (ref >39.00)
LDL Cholesterol: 95 mg/dL (ref 0–99)
Total CHOL/HDL Ratio: 3
Triglycerides: 32 mg/dL (ref 0.0–149.0)
VLDL: 6.4 mg/dL (ref 0.0–40.0)

## 2011-12-25 LAB — HEPATIC FUNCTION PANEL
ALT: 23 U/L (ref 0–35)
AST: 31 U/L (ref 0–37)
Albumin: 4.6 g/dL (ref 3.5–5.2)
Alkaline Phosphatase: 52 U/L (ref 39–117)
Bilirubin, Direct: 0.1 mg/dL (ref 0.0–0.3)
Total Bilirubin: 0.8 mg/dL (ref 0.3–1.2)
Total Protein: 7.8 g/dL (ref 6.0–8.3)

## 2012-01-01 ENCOUNTER — Other Ambulatory Visit: Payer: Self-pay

## 2012-01-01 DIAGNOSIS — E7800 Pure hypercholesterolemia, unspecified: Secondary | ICD-10-CM

## 2012-01-01 MED ORDER — ROSUVASTATIN CALCIUM 40 MG PO TABS: 40.0000 mg | ORAL_TABLET | Freq: Every day | ORAL | Status: DC

## 2012-02-12 ENCOUNTER — Other Ambulatory Visit (INDEPENDENT_AMBULATORY_CARE_PROVIDER_SITE_OTHER): Payer: Medicare Other

## 2012-02-12 DIAGNOSIS — E7800 Pure hypercholesterolemia, unspecified: Secondary | ICD-10-CM

## 2012-02-12 LAB — LIPID PANEL
Cholesterol: 153 mg/dL (ref 0–200)
HDL: 56.6 mg/dL (ref >39.00)
LDL Cholesterol: 87 mg/dL (ref 0–99)
Total CHOL/HDL Ratio: 3
Triglycerides: 45 mg/dL (ref 0.0–149.0)
VLDL: 9 mg/dL (ref 0.0–40.0)

## 2012-02-12 LAB — HEPATIC FUNCTION PANEL
ALT: 24 U/L (ref 0–35)
AST: 33 U/L (ref 0–37)
Albumin: 4.1 g/dL (ref 3.5–5.2)
Alkaline Phosphatase: 53 U/L (ref 39–117)
Bilirubin, Direct: 0.1 mg/dL (ref 0.0–0.3)
Total Bilirubin: 1 mg/dL (ref 0.3–1.2)
Total Protein: 7.5 g/dL (ref 6.0–8.3)

## 2012-02-26 ENCOUNTER — Other Ambulatory Visit: Payer: Self-pay | Admitting: Cardiology

## 2012-03-18 ENCOUNTER — Encounter (HOSPITAL_COMMUNITY): Payer: Self-pay

## 2012-03-18 ENCOUNTER — Emergency Department (HOSPITAL_COMMUNITY)
Admission: EM | Admit: 2012-03-18 | Discharge: 2012-03-18 | Disposition: A | Discharge status: Home / Self Care | Payer: Medicare Other | Attending: Emergency Medicine | Admitting: Emergency Medicine

## 2012-03-18 ENCOUNTER — Emergency Department (HOSPITAL_COMMUNITY): Payer: Medicare Other

## 2012-03-18 DIAGNOSIS — S0990XA Unspecified injury of head, initial encounter: Secondary | ICD-10-CM | POA: Insufficient documentation

## 2012-03-18 DIAGNOSIS — Z8673 Personal history of transient ischemic attack (TIA), and cerebral infarction without residual deficits: Secondary | ICD-10-CM | POA: Insufficient documentation

## 2012-03-18 DIAGNOSIS — R220 Localized swelling, mass and lump, head: Secondary | ICD-10-CM | POA: Insufficient documentation

## 2012-03-18 DIAGNOSIS — E7800 Pure hypercholesterolemia, unspecified: Secondary | ICD-10-CM | POA: Insufficient documentation

## 2012-03-18 DIAGNOSIS — F3289 Other specified depressive episodes: Secondary | ICD-10-CM | POA: Insufficient documentation

## 2012-03-18 DIAGNOSIS — Z7982 Long term (current) use of aspirin: Secondary | ICD-10-CM | POA: Insufficient documentation

## 2012-03-18 DIAGNOSIS — R42 Dizziness and giddiness: Secondary | ICD-10-CM | POA: Insufficient documentation

## 2012-03-18 DIAGNOSIS — S0003XA Contusion of scalp, initial encounter: Secondary | ICD-10-CM | POA: Insufficient documentation

## 2012-03-18 DIAGNOSIS — R51 Headache: Secondary | ICD-10-CM | POA: Insufficient documentation

## 2012-03-18 DIAGNOSIS — W1789XA Other fall from one level to another, initial encounter: Secondary | ICD-10-CM | POA: Insufficient documentation

## 2012-03-18 DIAGNOSIS — Z79899 Other long term (current) drug therapy: Secondary | ICD-10-CM | POA: Insufficient documentation

## 2012-03-18 DIAGNOSIS — Z853 Personal history of malignant neoplasm of breast: Secondary | ICD-10-CM | POA: Insufficient documentation

## 2012-03-18 DIAGNOSIS — S0083XA Contusion of other part of head, initial encounter: Secondary | ICD-10-CM | POA: Insufficient documentation

## 2012-03-18 DIAGNOSIS — E0590 Thyrotoxicosis, unspecified without thyrotoxic crisis or storm: Secondary | ICD-10-CM | POA: Insufficient documentation

## 2012-03-18 DIAGNOSIS — I10 Essential (primary) hypertension: Secondary | ICD-10-CM | POA: Insufficient documentation

## 2012-03-18 DIAGNOSIS — F329 Major depressive disorder, single episode, unspecified: Secondary | ICD-10-CM | POA: Insufficient documentation

## 2012-03-18 DIAGNOSIS — I2510 Atherosclerotic heart disease of native coronary artery without angina pectoris: Secondary | ICD-10-CM | POA: Insufficient documentation

## 2012-03-18 DIAGNOSIS — Y92009 Unspecified place in unspecified non-institutional (private) residence as the place of occurrence of the external cause: Secondary | ICD-10-CM | POA: Insufficient documentation

## 2012-03-18 DIAGNOSIS — W19XXXA Unspecified fall, initial encounter: Secondary | ICD-10-CM

## 2012-03-18 HISTORY — DX: Malignant (primary) neoplasm, unspecified: C80.1

## 2012-03-18 HISTORY — DX: Cerebral infarction, unspecified: I63.9

## 2012-03-18 MED ORDER — IBUPROFEN 800 MG PO TABS: 400.0000 mg | ORAL_TABLET | Freq: Three times a day (TID) | ORAL | Status: AC

## 2012-03-18 MED ORDER — OXYCODONE-ACETAMINOPHEN 5-325 MG PO TABS: 1.0000 | ORAL_TABLET | ORAL | Status: AC | PRN

## 2012-03-18 MED ORDER — OXYCODONE-ACETAMINOPHEN 5-325 MG PO TABS
1.0000 | ORAL_TABLET | Freq: Once | ORAL | Status: AC
  Administered 2012-03-18: 1 via ORAL
  Filled 2012-03-18: qty 1

## 2012-03-18 NOTE — ED Notes (Signed)
Pt continues at CT and X-ray at this time.

## 2012-03-18 NOTE — ED Provider Notes (Signed)
History     CSN: [id number]  Arrival date & time 03/18/12  1426   First MD Initiated Contact with Patient 03/18/12 1513      Chief Complaint  Patient presents with  . Fall    (Consider location/radiation/quality/duration/timing/severity/associated sxs/prior treatment) Patient is a 56 y.o. female presenting with fall. The history is provided by the patient and a relative. No language interpreter was used.  Fall The accident occurred 1 to 2 hours ago. Incident: Fell through the attic ceiling 15 ft to floor. She fell from a height of 11 to 15 ft. She landed on a hard floor. There was no blood loss. The point of impact was the head. The pain is present in the head. The pain is at a severity of 8/10. The pain is moderate. She was not ambulatory at the scene. There was no entrapment after the fall. There was no drug use involved in the accident. There was no alcohol use involved in the accident. Associated symptoms include headaches. Pertinent negatives include no fever, no numbness, no abdominal pain, no nausea and no vomiting. Associated symptoms comments: Unknown LOC. The symptoms are aggravated by pressure on the injury. Treatment on scene includes a backboard and a c-collar. She has tried nothing for the symptoms.  Patient reports falling through the ceiling of the attic to the floor.  Daughter found her lying on her face.  Hematoma noted to the L occipital 5cm around. May have hit her head on the attic floor coming down.   C/o h/a, dizziness and no neck pain or back pain.  LUE pain. PEARL,  Mae  =. Neuro in tact.   Patient was getting her attic blown with insulation so she was trying to cover up her belongings.    Daughter was asleep in the bedroom when it happened and the noise of her mother falling woke her.  The men working on the house did not witness.  ? LOC.  Patient unsure how she landed.  No back pain, feet pain, knee pain or ankle pain.  Doubt she landed on her feet.  pmh OHS, hypertension,  breast ca, stroke, hysterectomy.    Past Medical History  Diagnosis Date  . Coronary artery disease   . Hypercholesteremia   . Hypertension   . Hyperthyroidism   . Depression   . Carotid artery stenosis   . Stroke   . Cancer     breast cancer    Past Surgical History  Procedure Date  . Coronary artery bypass graft 2001    x 5 (left internal mammary artery to left anterior  descending coronary artery  . Breast lumpectomy 1999  . Carotid endarterectomy 04-2000    left  . Breast lumpectomy     left breast  . Abdominal hysterectomy     Family History  Problem Relation Age of Onset  . Lung cancer Mother     died at an early age  . Other      There is no Hx of premature coronary artery disease in her family    History  Substance Use Topics  . Smoking status: Former Smoker    Types: Cigarettes    Quit date: 09/11/2001  . Smokeless tobacco: Not on file  . Alcohol Use: No    OB History    Grav Para Term Preterm Abortions TAB SAB Ect Mult Living                  Review of Systems  Constitutional: Negative.  Negative for fever.  HENT: Negative for facial swelling, neck pain and neck stiffness.        Large bumb on the top of her head  Eyes: Negative.   Respiratory: Negative.   Cardiovascular: Negative.  Negative for chest pain and leg swelling.  Gastrointestinal: Negative.  Negative for nausea, vomiting, abdominal pain, constipation and abdominal distention.  Musculoskeletal: Negative for back pain.  Neurological: Positive for dizziness and headaches. Negative for seizures, facial asymmetry, speech difficulty, weakness and numbness.  Psychiatric/Behavioral: Negative.   All other systems reviewed and are negative.    Allergies  Review of patient's allergies indicates no known allergies.  Home Medications   Current Outpatient Rx  Name Route Sig Dispense Refill  . AMLODIPINE BESYLATE 5 MG PO TABS  TAKE ONE TABLET BY MOUTH EVERY DAY 30 tablet 9  . ASPIRIN 81  MG PO TABS Oral Take 81 mg by mouth daily.      . BUPROPION HCL ER (XL) 150 MG PO TB24  150 mg. 1 tab daily    . VITAMIN D 1000 UNITS PO CAPS Oral Take 1,000 Units by mouth daily.      Marland Kitchen LISINOPRIL-HYDROCHLOROTHIAZIDE 20-25 MG PO TABS  TAKE ONE TABLET BY MOUTH EVERY DAY 30 tablet 9  . MELOXICAM 7.5 MG PO TABS Oral Take 7.5 mg by mouth daily.    Marland Kitchen METOPROLOL SUCCINATE ER 50 MG PO TB24 Oral Take 50 mg by mouth daily.      Marland Kitchen MIRTAZAPINE 15 MG PO TABS  2 tabs at night    . ONE-DAILY MULTI VITAMINS PO TABS Oral Take 1 tablet by mouth daily.      Marland Kitchen POTASSIUM CHLORIDE CRYS ER 10 MEQ PO TBCR Oral Take 10 mEq by mouth daily.      Marland Kitchen ROSUVASTATIN CALCIUM 40 MG PO TABS Oral Take 1 tablet (40 mg total) by mouth daily. 30 tablet 11  . NITROGLYCERIN 0.4 MG SL SUBL Sublingual Place 0.4 mg under the tongue every 5 (five) minutes as needed.        BP 176/70  Pulse 91  Temp 98.2 F (36.8 C) (Oral)  Resp 20  SpO2 99%  Physical Exam  Nursing note and vitals reviewed. Constitutional: She is oriented to person, place, and time. She appears well-developed and well-nourished.  HENT:  Head: Head is with contusion.    Right Ear: Tympanic membrane normal.  Left Ear: Tympanic membrane normal.  Nose: Nose normal.  Mouth/Throat: Uvula is midline, oropharynx is clear and moist and mucous membranes are normal.  Eyes: Conjunctivae, EOM and lids are normal. Pupils are equal, round, and reactive to light.  Neck: Normal range of motion. Neck supple.  Cardiovascular: Normal rate.   Pulmonary/Chest: Effort normal.  Abdominal: Soft.  Musculoskeletal: Normal range of motion. She exhibits no edema and no tenderness.  Neurological: She is alert and oriented to person, place, and time. She has normal reflexes.  Skin: Skin is warm and dry.  Psychiatric: She has a normal mood and affect.    ED Course  Procedures (including critical care time)  Labs Reviewed - No data to display No results found.   No diagnosis  found.    MDM  15 foot fall from the attic hitting her head. Patient's near is intact. CT of the head negative for bleed. Patient was advised of head injury precautions. Her daughter will stay with her tonight. She will followup with Dr. Evlyn Kanner first thing in the morning. Left upper  extremity humerus film negative for fracture. C-spine negative for fracture. The patient and family agree ready for discharge.         Remi Haggard, NP 03/18/12 1740

## 2012-03-18 NOTE — ED Provider Notes (Signed)
Medical screening examination/treatment/procedure(s) were performed by non-physician practitioner and as supervising physician I was immediately available for consultation/collaboration.  Ethelda Chick, MD 03/18/12 [phone]

## 2012-03-18 NOTE — ED Notes (Signed)
Pt ambulated to and from RR with steady gait

## 2012-03-18 NOTE — ED Notes (Signed)
Patient transported to CT 

## 2012-03-18 NOTE — Progress Notes (Signed)
Visited pt per page for lv2 fall.  Introduced myself to pt dtr and other female, and pt.  Offered pastoral presence and support.   Accepted prayer together around bedside.   Pt thanked me for stopping by.  Will follow-up as needed or requested.

## 2012-03-18 NOTE — ED Notes (Signed)
Patient transported from CT and x-ray 

## 2012-03-18 NOTE — ED Notes (Signed)
Pt at home in attic, stepped thru the rafters thru the ceiling and landed feet first on a hardwood floor, tennis ball size hematoma to the left parietal and scrapes to the left inner bicept, pain 10

## 2012-03-20 ENCOUNTER — Other Ambulatory Visit: Payer: Self-pay | Admitting: Endocrinology

## 2012-03-20 DIAGNOSIS — E042 Nontoxic multinodular goiter: Secondary | ICD-10-CM

## 2012-03-26 ENCOUNTER — Ambulatory Visit
Admission: RE | Admit: 2012-03-26 | Discharge: 2012-03-26 | Disposition: A | Discharge status: Home / Self Care | Payer: Medicare Other | Source: Ambulatory Visit | Attending: Endocrinology | Admitting: Endocrinology

## 2012-03-26 DIAGNOSIS — E042 Nontoxic multinodular goiter: Secondary | ICD-10-CM

## 2012-04-24 ENCOUNTER — Other Ambulatory Visit: Payer: Self-pay | Admitting: Cardiology

## 2012-09-09 ENCOUNTER — Ambulatory Visit: Payer: Medicare Other | Attending: Internal Medicine | Admitting: Physical Therapy

## 2012-09-09 DIAGNOSIS — M545 Low back pain, unspecified: Secondary | ICD-10-CM | POA: Insufficient documentation

## 2012-09-09 DIAGNOSIS — IMO0001 Reserved for inherently not codable concepts without codable children: Secondary | ICD-10-CM | POA: Insufficient documentation

## 2012-09-09 DIAGNOSIS — M25559 Pain in unspecified hip: Secondary | ICD-10-CM | POA: Insufficient documentation

## 2012-09-17 ENCOUNTER — Ambulatory Visit: Payer: 59 | Attending: Internal Medicine | Admitting: Physical Therapy

## 2012-09-17 DIAGNOSIS — M25559 Pain in unspecified hip: Secondary | ICD-10-CM | POA: Insufficient documentation

## 2012-09-17 DIAGNOSIS — IMO0001 Reserved for inherently not codable concepts without codable children: Secondary | ICD-10-CM | POA: Insufficient documentation

## 2012-09-17 DIAGNOSIS — M545 Low back pain, unspecified: Secondary | ICD-10-CM | POA: Insufficient documentation

## 2012-09-19 ENCOUNTER — Ambulatory Visit: Payer: 59 | Admitting: Physical Therapy

## 2012-09-24 ENCOUNTER — Ambulatory Visit: Payer: 59 | Admitting: Physical Therapy

## 2012-09-26 ENCOUNTER — Ambulatory Visit: Payer: 59 | Admitting: Physical Therapy

## 2012-10-01 ENCOUNTER — Ambulatory Visit: Payer: 59 | Admitting: Physical Therapy

## 2012-10-03 ENCOUNTER — Ambulatory Visit: Payer: 59 | Admitting: Physical Therapy

## 2012-10-08 ENCOUNTER — Ambulatory Visit: Payer: 59 | Admitting: Physical Therapy

## 2012-10-10 ENCOUNTER — Encounter: Payer: Medicare Other | Admitting: Physical Therapy

## 2012-12-23 ENCOUNTER — Telehealth: Payer: Self-pay | Admitting: Oncology

## 2012-12-23 ENCOUNTER — Other Ambulatory Visit: Payer: Self-pay | Admitting: *Deleted

## 2012-12-23 ENCOUNTER — Other Ambulatory Visit: Payer: Self-pay | Admitting: Physician Assistant

## 2012-12-23 DIAGNOSIS — Z853 Personal history of malignant neoplasm of breast: Secondary | ICD-10-CM

## 2012-12-23 DIAGNOSIS — R2232 Localized swelling, mass and lump, left upper limb: Secondary | ICD-10-CM

## 2012-12-24 ENCOUNTER — Telehealth: Payer: Self-pay | Admitting: *Deleted

## 2012-12-24 NOTE — Telephone Encounter (Signed)
Lm stating her appt d/t for [facility] which is set for 12/25/12 @8 :15am. I also gv appt d/t for 01/23/13 @ 1:45pm.

## 2012-12-26 ENCOUNTER — Other Ambulatory Visit: Payer: Self-pay | Admitting: Physician Assistant

## 2012-12-26 DIAGNOSIS — R2232 Localized swelling, mass and lump, left upper limb: Secondary | ICD-10-CM

## 2012-12-26 DIAGNOSIS — Z853 Personal history of malignant neoplasm of breast: Secondary | ICD-10-CM

## 2012-12-26 NOTE — Progress Notes (Signed)
Brenda [name] (who is one of our [facility] volunteers) was seen here briefly earlier this week with complaints of a "bump" in her left upper arm. She has a history of left-sided breast cancer, and was concerned that this was a lymph node. She has had her annual mammogram and a left ultrasound at [facility] this week, and tells me that "everything was fine". The small lump, which is palpable, approximately 0.5-0.75 cm, is still in the upper left arm.  She has requested a referral to a surgeon for further evaluation. She's also due for routine followup with Dr. Truett Perna, and these appointments have been requested.  [name], PA-C 12/26/2012

## 2012-12-30 ENCOUNTER — Telehealth: Payer: Self-pay | Admitting: *Deleted

## 2012-12-30 NOTE — Telephone Encounter (Signed)
sw pt gv appt for [facility] w/Dr. Biagio Quint on 01/02/13 @1 :30pm...the patient is aware....td

## 2013-01-02 ENCOUNTER — Ambulatory Visit (INDEPENDENT_AMBULATORY_CARE_PROVIDER_SITE_OTHER): Payer: Medicare Other | Admitting: General Surgery

## 2013-01-02 ENCOUNTER — Encounter (INDEPENDENT_AMBULATORY_CARE_PROVIDER_SITE_OTHER): Payer: Self-pay | Admitting: General Surgery

## 2013-01-02 VITALS — BP 212/102 | HR 88 | Temp 99.1°F | Resp 16 | Ht 65.0 in | Wt 164.8 lb

## 2013-01-02 DIAGNOSIS — D1720 Benign lipomatous neoplasm of skin and subcutaneous tissue of unspecified limb: Secondary | ICD-10-CM

## 2013-01-02 DIAGNOSIS — D1779 Benign lipomatous neoplasm of other sites: Secondary | ICD-10-CM

## 2013-01-02 NOTE — Progress Notes (Signed)
Patient ID: Brenda [name], female   DOB: 28-Dec-1955, 57 y.o.   MRN: [id number]  No chief complaint on file.   HPI Brenda [name] is a 57 y.o. female.  This patient presents for evaluation of a left upper arm mass. She recently fell through her adequate and hit her left arm and her arm had some significant swelling but x-rays were negative. Since then her swelling and tenderness had improved but she noticed a small lump in the medial aspect of her left upper arm in the humerus region. She has not noticed any change since discovery of this. It is not causing significant discomfort. She was mostly concerned due to her history of left breast cancer status post lumpectomy and sentinel lymph node biopsies in 1999. She was treated with radiation and chemotherapy and continues to do her self breast exam and has not noticed any significant changes in her self breast exam and has not noticed any suspicious masses. She denies any systemic symptoms and denies any other masses anywhere else on her body. She recently had a mammogram which was BI-RADS 2 and there was no abnormalities noted in the left axilla HPI  Past Medical History  Diagnosis Date  . Coronary artery disease   . Hypercholesteremia   . Hypertension   . Hyperthyroidism   . Depression   . Carotid artery stenosis   . Stroke   . Cancer     breast cancer    Past Surgical History  Procedure Laterality Date  . Coronary artery bypass graft  2001    x 5 (left internal mammary artery to left anterior  descending coronary artery  . Breast lumpectomy  1999  . Carotid endarterectomy  04-2000    left  . Breast lumpectomy      left breast  . Abdominal hysterectomy      Family History  Problem Relation Age of Onset  . Lung cancer Mother     died at an early age  . Cancer Mother     lung  . Other      There is no Hx of premature coronary artery disease in her family    Social History History  Substance Use Topics  . Smoking status: Former  Smoker    Types: Cigarettes    Quit date: 09/11/2001  . Smokeless tobacco: Not on file  . Alcohol Use: No    No Known Allergies  Current Outpatient Prescriptions  Medication Sig Dispense Refill  . amLODipine (NORVASC) 5 MG tablet TAKE ONE TABLET BY MOUTH EVERY DAY  30 tablet  9  . aspirin 81 MG tablet Take 81 mg by mouth daily.        Marland Kitchen buPROPion (WELLBUTRIN XL) 150 MG 24 hr tablet 150 mg. 1 tab daily      . Cholecalciferol (VITAMIN D) 1000 UNITS capsule Take 1,000 Units by mouth daily.        Marland Kitchen lisinopril-hydrochlorothiazide (PRINZIDE,ZESTORETIC) 20-25 MG per tablet TAKE ONE TABLET BY MOUTH EVERY DAY  30 tablet  9  . meloxicam (MOBIC) 7.5 MG tablet Take 7.5 mg by mouth daily.      . metoprolol (TOPROL-XL) 50 MG 24 hr tablet Take 50 mg by mouth daily.        . Multiple Vitamin (MULTIVITAMIN) tablet Take 1 tablet by mouth daily.        . nitroGLYCERIN (NITROSTAT) 0.4 MG SL tablet Place 0.4 mg under the tongue every 5 (five) minutes as needed.        Marland Kitchen  rosuvastatin (CRESTOR) 40 MG tablet Take 1 tablet (40 mg total) by mouth daily.  30 tablet  11  . KLOR-CON M10 10 MEQ tablet TAKE ONE TABLET BY MOUTH EVERY DAY  30 each  11  . mirtazapine (REMERON) 15 MG tablet 2 tabs at night       No current facility-administered medications for this visit.    Review of Systems Review of Systems All other review of systems negative or noncontributory except as stated in the HPI  Blood pressure 212/102, pulse 88, temperature 99.1 F (37.3 C), temperature source Oral, resp. rate 16, height 5\' 5"  (1.651 m), weight 164 lb 12.8 oz (74.753 kg).  Physical Exam Physical Exam Physical Exam  Nursing note and vitals reviewed. Constitutional: She is oriented to person, place, and time. She appears well-developed and well-nourished. No distress.  HENT:  Head: Normocephalic and atraumatic.  Mouth/Throat: No oropharyngeal exudate.  Eyes: Conjunctivae and EOM are normal. Pupils are equal, round, and reactive  to light. Right eye exhibits no discharge. Left eye exhibits no discharge. No scleral icterus.  Neck: Normal range of motion. Neck supple. No tracheal deviation present.  Cardiovascular: Normal rate, regular rhythm, normal heart sounds and intact distal pulses.   Pulmonary/Chest: Effort normal and breath sounds normal. No stridor. No respiratory distress. She has no wheezes.  Abdominal: Soft. Bowel sounds are normal. She exhibits no distension and no mass. There is no tenderness. There is no rebound and no guarding.  Musculoskeletal: Normal range of motion. She exhibits no edema and no tenderness.  Neurological: She is alert and oriented to person, place, and time.  Skin: Skin is warm and dry. No rash noted. She is not diaphoretic. No erythema. No pallor.  Psychiatric: She has a normal mood and affect. Her behavior is normal. Judgment and thought content normal.  Breast: right is normal without suspicious masses, skin changes or LAD, left has postop scarring and dimpling but no suspicious masses, and no LAD. Extremities: in the medial aspect of her left upper arm medial to the humerus she has a small 1cm nodule which is palpable under the skin.  No signs of infection or LAD in other areas  Data Reviewed mammo  Assessment    Left arm mass I think that this arm mass most likely represents a small lipoma.  This could also be a lymph node of, neuroma of, or fat necrosis from her recent trauma. At do not think that this represents any malignancy though I did discuss with her the possibility of this and the only way to completely rule this out his with excisional biopsy. Again, I think that this is likely a benign lipoma, and I have offered excision of for definitive diagnosis. She says that she understands that this is the possibility of a limousine and that is why she came in for evaluation at this point, unless we've feel that this is higher likelihood of malignancy then she would not like to have  this removed. I recommended that she continue with her monthly self breast exam and examined this area and if any increase in size of or change that she followup sooner for repeat evaluation and to discuss excision. If there is no change, and I recommended that she followup in 3 months for repeat evaluation and to ensure stability     Plan    Continue monthly self breast exams and follow this area out here back in 3 months for repeat evaluation Her blood pressure is noted to  be very high today and she says that she ran out of her blood pressure medications and has been off it for the last few days but she is going to have this refilled today.  She is asymptomatic and I think that this is reasonable and there is no evidence of hypertensive emergency.       Lodema Pilot DAVID 01/02/2013, 1:57 PM

## 2013-01-15 ENCOUNTER — Other Ambulatory Visit: Payer: Self-pay | Admitting: Obstetrics and Gynecology

## 2013-01-23 ENCOUNTER — Ambulatory Visit (HOSPITAL_BASED_OUTPATIENT_CLINIC_OR_DEPARTMENT_OTHER): Payer: Medicare Other | Admitting: Nurse Practitioner

## 2013-01-23 ENCOUNTER — Telehealth: Payer: Self-pay | Admitting: Oncology

## 2013-01-23 VITALS — BP 175/85 | HR 84 | Temp 98.3°F | Resp 18 | Ht 65.0 in | Wt 163.2 lb

## 2013-01-23 DIAGNOSIS — C50912 Malignant neoplasm of unspecified site of left female breast: Secondary | ICD-10-CM

## 2013-01-23 DIAGNOSIS — Z853 Personal history of malignant neoplasm of breast: Secondary | ICD-10-CM

## 2013-01-23 NOTE — Progress Notes (Signed)
OFFICE PROGRESS NOTE  Interval history:  Brenda [name] is a 57 year old woman diagnosis stage I left-sided breast cancer in December 1999. She was last seen at the cancer Center in October of 2011. Mammogram 12/25/2012 was stable with benign microcalcifications noted in the region of the left breast. Yearly mammogram recommended.  She feels well. She denies pain. She has a good appetite. Her weight is stable. No shortness of breath or cough. No bowel or bladder problems. She denies vaginal bleeding.  Approximately 1 year ago she fell through the floor of her attic and injured the left arm. She had significant swelling. About 3 months ago she noticed a "knot" at the left upper arm. She saw Dr. Biagio Quint on 01/02/2013. He felt the mass most likely represented a small lipoma but could also be a lymph node, neuroma or fat necrosis from the recent trauma. Excision was discussed. She elected observation with a followup visit with Dr. Biagio Quint at a three-month interval.   Objective: Blood pressure 175/85, pulse 84, temperature 98.3 F (36.8 C), resp. rate 18, height 5\' 5"  (1.651 m), weight 163 lb 3.2 oz (74.027 kg).  No thrush or ulceration. No palpable cervical, supraclavicular or axillary lymph nodes. Status post left lumpectomy. No mass palpated in either breast. Lungs clear. Regular cardiac rhythm. Abdomen soft and nontender. No hepatomegaly. Extremities without edema. Scar at the left upper posterior/medial arm. Just inferior to the middle of the scar there is a pea-sized subcutaneous nodule.  Lab Results: No results found for this basename: WBC, HGB, HCT, MCV, PLT    Chemistry:    Chemistry      Component Value Date/Time   NA 138 10/10/2011 0837   K 3.8 10/10/2011 0837   CL 101 10/10/2011 0837   CO2 28 10/10/2011 0837   BUN 15 10/10/2011 0837   CREATININE 0.6 10/10/2011 0837      Component Value Date/Time   CALCIUM 9.7 10/10/2011 0837   ALKPHOS 53 02/12/2012 1111   AST 33 02/12/2012 1111   ALT 24  02/12/2012 1111   BILITOT 1.0 02/12/2012 1111       Studies/Results: No results found.  Medications: I have reviewed the patient's current medications.  Assessment/Plan:  1. Stage I left-sided breast cancer diagnosed December 1999. 2. Subcutaneous nodule left upper arm in an area of previous trauma. Question lipoma, question lymph node.  Disposition-Brenda [name] appears stable. She remains in clinical remission from breast cancer. She will continue annual mammograms. She will return for a followup visit in one year.   She will followup with Dr. Biagio Quint regarding the left upper arm nodule. She was instructed to contact either our office or Dr. Biagio Quint if she notes any change in the nodule prior to her next visit with him.  Plan reviewed with Dr. Truett Perna.  Brenda [name] ANP/GNP-BC

## 2013-01-24 ENCOUNTER — Telehealth: Payer: Self-pay | Admitting: Oncology

## 2013-03-04 ENCOUNTER — Other Ambulatory Visit: Payer: Self-pay | Admitting: *Deleted

## 2013-03-04 MED ORDER — LISINOPRIL-HYDROCHLOROTHIAZIDE 20-25 MG PO TABS: ORAL_TABLET | ORAL | Status: DC

## 2013-03-28 ENCOUNTER — Encounter: Payer: Self-pay | Admitting: *Deleted

## 2013-03-31 ENCOUNTER — Encounter: Payer: Self-pay | Admitting: Cardiology

## 2013-03-31 ENCOUNTER — Ambulatory Visit (INDEPENDENT_AMBULATORY_CARE_PROVIDER_SITE_OTHER): Payer: Medicare Other | Admitting: Cardiology

## 2013-03-31 VITALS — BP 158/88 | HR 78 | Ht 65.0 in | Wt 158.8 lb

## 2013-03-31 DIAGNOSIS — E7800 Pure hypercholesterolemia, unspecified: Secondary | ICD-10-CM

## 2013-03-31 DIAGNOSIS — I10 Essential (primary) hypertension: Secondary | ICD-10-CM

## 2013-03-31 DIAGNOSIS — I2510 Atherosclerotic heart disease of native coronary artery without angina pectoris: Secondary | ICD-10-CM

## 2013-03-31 DIAGNOSIS — I679 Cerebrovascular disease, unspecified: Secondary | ICD-10-CM

## 2013-03-31 NOTE — Assessment & Plan Note (Signed)
Continue statin. Check lipids and liver. 

## 2013-03-31 NOTE — Assessment & Plan Note (Addendum)
Continue aspirin and statin. Schedule Myoview for risk stratification. 

## 2013-03-31 NOTE — Assessment & Plan Note (Signed)
Continue aspirin and statin. Schedule followup carotid Dopplers. 

## 2013-03-31 NOTE — Patient Instructions (Addendum)
Your physician wants you to follow-up in: ONE YEAR WITH DR Shelda Pal will receive a reminder letter in the mail two months in advance. If you don't receive a letter, please call our office to schedule the follow-up appointment.   Your physician has requested that you have en exercise stress myoview. For further information please visit [email or web address] Please follow instruction sheet, as given.   Your physician has requested that you have a carotid duplex. This test is an ultrasound of the carotid arteries in your neck. It looks at blood flow through these arteries that supply the brain with blood. Allow one hour for this exam. There are no restrictions or special instructions.   Your physician recommends that you return for lab work WITH STRESS TEST

## 2013-03-31 NOTE — Addendum Note (Signed)
Addended by: Freddi Starr on: 03/31/2013 02:14 PM   Modules accepted: Orders

## 2013-03-31 NOTE — Progress Notes (Signed)
HPI: 57 year old female previously followed by Dr. Riley Kill for followup of coronary artery disease and cerebrovascular disease. Patient is status post coronary artery bypass graft in 2001 (LIMA to the LAD, sequential saphenous vein graft to the distal right coronary and PDA, sequential saphenous vein graft to the ramus and obtuse marginal). She also had carotid endarterectomy at that time as well. Echocardiogram in October of 2010 showed normal LV function and mild mitral regurgitation. Renal Dopplers in January of 2012 showed normal renal arteries and no abdominal aortic aneurysm. Last carotid Dopplers in November 2012 showed a 40-59% right and 0-39% left stenosis. Followup recommended in one year. Patient last seen Jan 2013. Since then, the patient has dyspnea with more extreme activities but not with routine activities. It is relieved with rest. It is not associated with chest pain. There is no orthopnea, PND or pedal edema. There is no syncope or palpitations. There is no exertional chest pain.   Current Outpatient Prescriptions  Medication Sig Dispense Refill  . amLODipine (NORVASC) 5 MG tablet TAKE ONE TABLET BY MOUTH EVERY DAY  30 tablet  9  . aspirin 81 MG tablet Take 81 mg by mouth daily.        Marland Kitchen buPROPion (WELLBUTRIN XL) 150 MG 24 hr tablet 150 mg. 1 tab daily      . Cholecalciferol (VITAMIN D) 1000 UNITS capsule Take 1,000 Units by mouth daily.        Marland Kitchen KLOR-CON M10 10 MEQ tablet TAKE ONE TABLET BY MOUTH EVERY DAY  30 each  11  . lisinopril-hydrochlorothiazide (PRINZIDE,ZESTORETIC) 20-25 MG per tablet TAKE ONE TABLET BY MOUTH EVERY DAY  90 tablet  3  . meloxicam (MOBIC) 7.5 MG tablet Take 7.5 mg by mouth daily.      . metoprolol (TOPROL-XL) 50 MG 24 hr tablet Take 50 mg by mouth daily.        . mirtazapine (REMERON) 15 MG tablet 2 tabs at night      . Multiple Vitamin (MULTIVITAMIN) tablet Take 1 tablet by mouth daily.        . nitroGLYCERIN (NITROSTAT) 0.4 MG SL tablet Place 0.4 mg  under the tongue every 5 (five) minutes as needed.        . rosuvastatin (CRESTOR) 40 MG tablet Take 1 tablet (40 mg total) by mouth daily.  30 tablet  11   No current facility-administered medications for this visit.     Past Medical History  Diagnosis Date  . Coronary artery disease   . Hypercholesteremia   . Hypertension   . Hyperthyroidism   . Depression   . Carotid artery stenosis   . Stroke   . Cancer     breast cancer    Past Surgical History  Procedure Laterality Date  . Coronary artery bypass graft  2001    x 5 (left internal mammary artery to left anterior  descending coronary artery  . Breast lumpectomy  1999  . Carotid endarterectomy  04-2000    left  . Breast lumpectomy      left breast  . Abdominal hysterectomy      History   Social History  . Marital Status: Single    Spouse Name: N/A    Number of Children: 1  . Years of Education: N/A   Occupational History  . Social worker    Social History Main Topics  . Smoking status: Former Smoker    Types: Cigarettes    Quit date: 09/11/2001  . Smokeless  tobacco: Not on file  . Alcohol Use: No  . Drug Use: No  . Sexually Active: Not on file   Other Topics Concern  . Not on file   Social History Narrative  . No narrative on file    ROS: no fevers or chills, productive cough, hemoptysis, dysphasia, odynophagia, melena, hematochezia, dysuria, hematuria, rash, seizure activity, orthopnea, PND, pedal edema, claudication. Remaining systems are negative.  Physical Exam: Well-developed well-nourished in no acute distress.  Skin is warm and dry.  HEENT is normal.  Neck is supple.  Chest is clear to auscultation with normal expansion.  Cardiovascular exam is regular rate and rhythm.  Abdominal exam nontender or distended. No masses palpated. Extremities show no edema. neuro grossly intact  ECG sinus rhythm at a rate of 78. Left atrial enlargement. Left ventricular hypertrophy with repolarization  abnormality.

## 2013-03-31 NOTE — Assessment & Plan Note (Signed)
Blood pressure is elevated. However she has not taken her medications as morning. I have asked her to follow this at home and we will adjust regimen as indicated. Check potassium and renal function.

## 2013-04-01 ENCOUNTER — Encounter (INDEPENDENT_AMBULATORY_CARE_PROVIDER_SITE_OTHER): Payer: Medicare Other

## 2013-04-01 DIAGNOSIS — I679 Cerebrovascular disease, unspecified: Secondary | ICD-10-CM

## 2013-04-01 DIAGNOSIS — I6529 Occlusion and stenosis of unspecified carotid artery: Secondary | ICD-10-CM

## 2013-04-03 ENCOUNTER — Ambulatory Visit (HOSPITAL_COMMUNITY): Payer: Medicare Other | Attending: Cardiovascular Disease | Admitting: Radiology

## 2013-04-03 ENCOUNTER — Encounter: Payer: Self-pay | Admitting: Cardiology

## 2013-04-03 ENCOUNTER — Other Ambulatory Visit (INDEPENDENT_AMBULATORY_CARE_PROVIDER_SITE_OTHER): Payer: Medicare Other

## 2013-04-03 VITALS — BP 183/77 | HR 77 | Ht 65.0 in | Wt 155.0 lb

## 2013-04-03 DIAGNOSIS — I4949 Other premature depolarization: Secondary | ICD-10-CM

## 2013-04-03 DIAGNOSIS — Z8673 Personal history of transient ischemic attack (TIA), and cerebral infarction without residual deficits: Secondary | ICD-10-CM | POA: Insufficient documentation

## 2013-04-03 DIAGNOSIS — Z8249 Family history of ischemic heart disease and other diseases of the circulatory system: Secondary | ICD-10-CM | POA: Insufficient documentation

## 2013-04-03 DIAGNOSIS — I10 Essential (primary) hypertension: Secondary | ICD-10-CM

## 2013-04-03 DIAGNOSIS — R0989 Other specified symptoms and signs involving the circulatory and respiratory systems: Secondary | ICD-10-CM | POA: Insufficient documentation

## 2013-04-03 DIAGNOSIS — I2510 Atherosclerotic heart disease of native coronary artery without angina pectoris: Secondary | ICD-10-CM

## 2013-04-03 DIAGNOSIS — I779 Disorder of arteries and arterioles, unspecified: Secondary | ICD-10-CM | POA: Insufficient documentation

## 2013-04-03 DIAGNOSIS — E785 Hyperlipidemia, unspecified: Secondary | ICD-10-CM | POA: Insufficient documentation

## 2013-04-03 DIAGNOSIS — Z87891 Personal history of nicotine dependence: Secondary | ICD-10-CM | POA: Insufficient documentation

## 2013-04-03 DIAGNOSIS — E7800 Pure hypercholesterolemia, unspecified: Secondary | ICD-10-CM

## 2013-04-03 DIAGNOSIS — R0602 Shortness of breath: Secondary | ICD-10-CM

## 2013-04-03 DIAGNOSIS — Z951 Presence of aortocoronary bypass graft: Secondary | ICD-10-CM | POA: Insufficient documentation

## 2013-04-03 DIAGNOSIS — R0609 Other forms of dyspnea: Secondary | ICD-10-CM | POA: Insufficient documentation

## 2013-04-03 LAB — BASIC METABOLIC PANEL
BUN: 16 mg/dL (ref 6–23)
CO2: 28 mEq/L (ref 19–32)
Calcium: 9.7 mg/dL (ref 8.4–10.5)
Chloride: 104 mEq/L (ref 96–112)
Creatinine, Ser: 0.7 mg/dL (ref 0.4–1.2)
GFR: 108.28 mL/min (ref >60.00)
Glucose, Bld: 98 mg/dL (ref 70–99)
Potassium: 3.8 mEq/L (ref 3.5–5.1)
Sodium: 138 mEq/L (ref 135–145)

## 2013-04-03 LAB — HEPATIC FUNCTION PANEL
ALT: 21 U/L (ref 0–35)
AST: 23 U/L (ref 0–37)
Albumin: 4.2 g/dL (ref 3.5–5.2)
Alkaline Phosphatase: 49 U/L (ref 39–117)
Bilirubin, Direct: 0.1 mg/dL (ref 0.0–0.3)
Total Bilirubin: 1.1 mg/dL (ref 0.3–1.2)
Total Protein: 7.7 g/dL (ref 6.0–8.3)

## 2013-04-03 LAB — LDL CHOLESTEROL, DIRECT: Direct LDL: 151.5 mg/dL

## 2013-04-03 LAB — LIPID PANEL
Cholesterol: 229 mg/dL — ABNORMAL HIGH (ref 0–200)
HDL: 55 mg/dL (ref >39.00)
Total CHOL/HDL Ratio: 4
Triglycerides: 73 mg/dL (ref 0.0–149.0)
VLDL: 14.6 mg/dL (ref 0.0–40.0)

## 2013-04-03 MED ORDER — TECHNETIUM TC 99M SESTAMIBI GENERIC - CARDIOLITE
33.0000 | Freq: Once | INTRAVENOUS | Status: AC | PRN
  Administered 2013-04-03: 33 via INTRAVENOUS

## 2013-04-03 MED ORDER — TECHNETIUM TC 99M SESTAMIBI GENERIC - CARDIOLITE
10.7000 | Freq: Once | INTRAVENOUS | Status: AC | PRN
  Administered 2013-04-03: 11 via INTRAVENOUS

## 2013-04-03 MED ORDER — REGADENOSON 0.4 MG/5ML IV SOLN
0.4000 mg | Freq: Once | INTRAVENOUS | Status: AC
  Administered 2013-04-03: 0.4 mg via INTRAVENOUS

## 2013-04-03 NOTE — Progress Notes (Signed)
[facility] SITE 3 NUCLEAR MED [address] [place], [place] [address] [phone]    Cardiology Nuclear Med Study  Brenda [name] is a 57 y.o. female     MRN : [id number]     DOB: March 02, 1956  Procedure Date: 04/03/2013  Nuclear Med Background Indication for Stress Test:  Evaluation for Ischemia and Graft Patency History:  '01 Cath>MPS>CABG, EF=65%; '10 ION:[id number] ST changes; '10 Echo:EF=60% Cardiac Risk Factors: Carotid Disease, CVA, History of Smoking, Hypertension and Lipids, Family History Unknown  Symptoms:  DOE and Rapid HR   Nuclear Pre-Procedure Caffeine/Decaff Intake:  None> 12 hrs NPO After: 7:00pm   Lungs:  Clear. O2 Sat: 97% on room air. IV 0.9% NS with Angio Cath:  22g  IV Site: R Antecubital x 1, tolerated well IV Started by:  Irean Hong, RN  Chest Size (in):  38 Cup Size: D  Height: 5\' 5"  (1.651 m)  Weight:  155 lb (70.308 kg)  BMI:  Body mass index is 25.79 kg/(m^2). Tech Comments:  Took Toprol today per MD    Nuclear Med Study 1 or 2 day study: 1 day  Stress Test Type:  Treadmill/Lexiscan  Reading MD: Charlton Haws, MD  Order Authorizing Provider:  Olga Millers, MD  Resting Radionuclide: Technetium 41m Sestamibi  Resting Radionuclide Dose: 10.7 mCi   Stress Radionuclide:  Technetium 37m Sestamibi  Stress Radionuclide Dose: 33.0 mCi           Stress Protocol Rest HR: 61 Stress HR: 122  Rest BP: 183/77 Stress BP: 239/77  Exercise Time (min): 6:00 METS: 7.0   Predicted Max HR: 161 bpm % Max HR: 75.78 bpm Rate Pressure Product: 44010   Dose of Adenosine (mg):  n/a Dose of Lexiscan: 0.4 mg  Dose of Atropine (mg): n/a Dose of Dobutamine: n/a mcg/kg/min (at max HR)  Stress Test Technologist: Smiley Houseman, CMA-N  Nuclear Technologist:  Domenic Polite, CNMT     Rest Procedure:  Myocardial perfusion imaging was performed at rest 45 minutes following the intravenous administration of Technetium 28m Sestamibi.  Rest ECG:  NSR-LVH  Stress Procedure: The patient initially walked the treadmill utilizing the Bruce protocol for 7:25, but was unable to reach her target heart rate.   The patient received IV Lexiscan 0.4 mg over 15-seconds with concurrent low level exercise and then Technetium 47m Sestamibi was injected at 30-seconds.  Quantitative spect images were obtained after a 45-minute delay.  Stress ECG: No significant change from baseline ECG  QPS Raw Data Images:  Normal; no motion artifact; normal heart/lung ratio. Stress Images:  Normal homogeneous uptake in all areas of the myocardium. Rest Images:  Normal homogeneous uptake in all areas of the myocardium. Subtraction (SDS):  Normal Transient Ischemic Dilatation (Normal <1.22):  n/a Lung/Heart Ratio (Normal <0.45):  0.31  Quantitative Gated Spect Images QGS EDV:  67 ml QGS ESV:  27 ml  Impression Exercise Capacity:  Lexiscan with low level exercise. BP Response:  Hypertensive blood pressure response. Clinical Symptoms:  No significant symptoms noted. ECG Impression:  No significant ST segment change suggestive of ischemia. Comparison with Prior Nuclear Study:   Overall Impression:  Normal stress nuclear study.  LV Ejection Fraction: 60%.  LV Wall Motion:  NL LV Function; NL Wall Motion  Surface images not loaded to work station. Lexiscan given due to inability to reach target HR after 7:25 of exercise   Charlton Haws

## 2013-04-08 ENCOUNTER — Telehealth: Payer: Self-pay | Admitting: Cardiology

## 2013-04-08 MED ORDER — METOPROLOL SUCCINATE ER 50 MG PO TB24: 50.0000 mg | ORAL_TABLET | Freq: Every day | ORAL | Status: DC

## 2013-04-08 MED ORDER — AMLODIPINE BESYLATE 5 MG PO TABS: 5.0000 mg | ORAL_TABLET | Freq: Every day | ORAL | Status: DC

## 2013-04-08 NOTE — Telephone Encounter (Signed)
Spoke with pt, aware of test results. She also reports her bp running 210/112 when she gets up prior to taking her meds. After reviewing meds the pt is not taking her amlodipine or metoprolol. The scripts have run out and she has not gotten refills. Refills sent to pharm, med compliance and diet restrictions regarding salt and sodium discussed. She will cont to monitor and call with questions.

## 2013-04-08 NOTE — Telephone Encounter (Signed)
Follow Up     Pt calling to follow up on her test results from stress test. Please call.

## 2013-05-06 ENCOUNTER — Telehealth: Payer: Self-pay | Admitting: Cardiology

## 2013-05-06 NOTE — Telephone Encounter (Signed)
Walk In Patient Form: [employer] Medical Clearance Form needs to be completed by Dr. Jens Som by 9.10.14 Please call when ready [phone]

## 2013-05-07 ENCOUNTER — Encounter: Payer: Self-pay | Admitting: Oncology

## 2013-05-07 NOTE — Progress Notes (Signed)
Continue form prev notes...Marland KitchenMarland Kitchenher insurance paid but left her some.I advised her that she must call [facility] for asst from them. They do their own billing. [facility] has possible assist with [place] bills only.

## 2013-05-07 NOTE — Progress Notes (Unsigned)
Patient had called and left a message to call her back about fin asst. I called her back and she said it was a bill for [employer]. I advised yes it was done here, but they bill for themselves so any asst with their bill would have to come from them. She said her insurance paid,

## 2013-05-08 ENCOUNTER — Other Ambulatory Visit: Payer: Self-pay | Admitting: Cardiology

## 2013-05-13 ENCOUNTER — Telehealth: Payer: Self-pay | Admitting: Cardiology

## 2013-05-13 NOTE — Telephone Encounter (Signed)
Dr.Crenshaw signed [employer] form, Pt Aware and asked for it to Be Mailed to  Her Home Address, Dropped In Mail Today  05/13/13/KM

## 2013-08-27 ENCOUNTER — Telehealth: Payer: Self-pay | Admitting: Cardiology

## 2013-08-27 NOTE — Telephone Encounter (Signed)
Patient aware that samples will be left at the front desk for pick up.

## 2013-08-27 NOTE — Telephone Encounter (Signed)
New Message  Pt called asks for samples of crestor// Sent pt to medications

## 2014-01-22 ENCOUNTER — Ambulatory Visit (HOSPITAL_BASED_OUTPATIENT_CLINIC_OR_DEPARTMENT_OTHER): Payer: Medicare Other | Admitting: Oncology

## 2014-01-22 VITALS — BP 173/70 | HR 68 | Temp 98.4°F | Resp 20 | Ht 65.0 in | Wt 168.4 lb

## 2014-01-22 DIAGNOSIS — Z853 Personal history of malignant neoplasm of breast: Secondary | ICD-10-CM

## 2014-01-22 DIAGNOSIS — C50912 Malignant neoplasm of unspecified site of left female breast: Secondary | ICD-10-CM

## 2014-01-22 NOTE — Progress Notes (Signed)
  [name] OFFICE PROGRESS NOTE   Diagnosis: Breast cancer  INTERVAL HISTORY:   She returns as scheduled. She feels well. No palpable change over either breast. She missed her scheduled mammogram this year and is rescheduled for 02/12/2014. She injured her right calf exercising earlier this week. She developed pain and swelling. This has improved. The subcutaneous nodule at the left upper arm is no longer palpable.  She is now volunteering in the breast clinic.  Objective:  Vital signs in last 24 hours:  Blood pressure 173/70, pulse 68, temperature 98.4 F (36.9 C), temperature source Oral, resp. rate 20, height 5\' 5"  (1.651 m), weight 168 lb 6.4 oz (76.386 kg), SpO2 100.00%.    HEENT: Neck without Lymphatics: No cervical, supraclavicular, axillary, or inguinal nodes Resp: Lungs clear bilaterally Cardio: Regular rhythm, 2/6 systolic murmur GI: No hepatomegaly Vascular: No leg edema, examination of the right lower leg and calf is unremarkable Breasts: Status post left lumpectomy. No evidence for local tumor recurrence. No mass in either breast.    Medications: I have reviewed the patient's current medications.  Assessment/Plan: 1. Stage I left-sided breast cancer diagnosed December 1999.   Disposition:  [name] remains in clinical remission from breast cancer. She would like to continue followup at the [facility]. She will continue yearly mammography.  [name] will return for an office visit in one year.  Ladell Pier, MD  01/22/2014  4:25 PM

## 2014-01-23 ENCOUNTER — Telehealth: Payer: Self-pay | Admitting: Oncology

## 2014-01-23 NOTE — Telephone Encounter (Signed)
lvm for pt regarding to May 2016 appt...mailed pt appt sched/avs adn letter

## 2014-04-08 ENCOUNTER — Inpatient Hospital Stay (HOSPITAL_COMMUNITY): Admission: RE | Admit: 2014-04-08 | Payer: Medicare Other | Source: Ambulatory Visit

## 2014-04-27 ENCOUNTER — Ambulatory Visit (INDEPENDENT_AMBULATORY_CARE_PROVIDER_SITE_OTHER): Payer: Medicare Other | Admitting: Cardiology

## 2014-04-27 ENCOUNTER — Encounter: Payer: Self-pay | Admitting: Cardiology

## 2014-04-27 ENCOUNTER — Encounter: Payer: Self-pay | Admitting: *Deleted

## 2014-04-27 VITALS — BP 162/80 | HR 77 | Ht 65.0 in | Wt 164.9 lb

## 2014-04-27 DIAGNOSIS — I2510 Atherosclerotic heart disease of native coronary artery without angina pectoris: Secondary | ICD-10-CM

## 2014-04-27 DIAGNOSIS — E7800 Pure hypercholesterolemia, unspecified: Secondary | ICD-10-CM

## 2014-04-27 DIAGNOSIS — I10 Essential (primary) hypertension: Secondary | ICD-10-CM

## 2014-04-27 DIAGNOSIS — I6529 Occlusion and stenosis of unspecified carotid artery: Secondary | ICD-10-CM

## 2014-04-27 MED ORDER — AMLODIPINE BESYLATE 10 MG PO TABS: 10.0000 mg | ORAL_TABLET | Freq: Every day | ORAL | Status: DC

## 2014-04-27 MED ORDER — ROSUVASTATIN CALCIUM 40 MG PO TABS: 40.0000 mg | ORAL_TABLET | Freq: Every day | ORAL | Status: DC

## 2014-04-27 NOTE — Patient Instructions (Signed)
Your physician wants you to follow-up in: [place] will receive a reminder letter in the mail two months in advance. If you don't receive a letter, please call our office to schedule the follow-up appointment.   INCREASE AMLODIPINE TO 10 MG ONCE DAILY  Your physician has requested that you have a carotid duplex. This test is an ultrasound of the carotid arteries in your neck. It looks at blood flow through these arteries that supply the brain with blood. Allow one hour for this exam. There are no restrictions or special instructions.

## 2014-04-27 NOTE — Assessment & Plan Note (Signed)
Blood pressure elevated. Increase amlodipine to 10 mg daily and follow. 

## 2014-04-27 NOTE — Progress Notes (Signed)
HPI: FU coronary artery disease and cerebrovascular disease. Patient is status post coronary artery bypass graft in 2001 (LIMA to the LAD, sequential saphenous vein graft to the distal right coronary and PDA, sequential saphenous vein graft to the ramus and obtuse marginal). She also had carotid endarterectomy at that time as well. Echocardiogram in October of 2010 showed normal LV function and mild mitral regurgitation. Renal Dopplers in January of 2012 showed normal renal arteries and no abdominal aortic aneurysm. Last carotid Dopplers in July 2014 showed a 40-59% right and 0-39% left stenosis; moderate left subclavian stenosis. Followup recommended in one year. Nuclear study 7/14 showed EF 60 and no ischemia. Patient last seen July 2014. Since then, the patient has dyspnea with more extreme activities but not with routine activities. It is relieved with rest. It is not associated with chest pain. There is no orthopnea, PND or pedal edema. There is no syncope or palpitations. There is no exertional chest pain.   Current Outpatient Prescriptions  Medication Sig Dispense Refill  . amLODipine (NORVASC) 5 MG tablet Take 1 tablet (5 mg total) by mouth daily.  30 tablet  12  . aspirin 81 MG tablet Take 81 mg by mouth daily.        . Cholecalciferol (VITAMIN D) 1000 UNITS capsule Take 2,000 Units by mouth daily.       Marland Kitchen lisinopril-hydrochlorothiazide (PRINZIDE,ZESTORETIC) 20-25 MG per tablet TAKE ONE TABLET BY MOUTH EVERY DAY  90 tablet  3  . meloxicam (MOBIC) 7.5 MG tablet Take 7.5 mg by mouth daily.      . metoprolol succinate (TOPROL-XL) 50 MG 24 hr tablet Take 1 tablet (50 mg total) by mouth daily.  30 tablet  12  . Multiple Vitamin (MULTIVITAMIN) tablet Take 1 tablet by mouth daily.        . nitroGLYCERIN (NITROSTAT) 0.4 MG SL tablet Place 0.4 mg under the tongue every 5 (five) minutes as needed.        . potassium chloride (K-DUR) 10 MEQ tablet TAKE ONE TABLET BY MOUTH EVERY DAY  30 tablet  6   . rosuvastatin (CRESTOR) 40 MG tablet Take 1 tablet (40 mg total) by mouth daily.  30 tablet  11   No current facility-administered medications for this visit.     Past Medical History  Diagnosis Date  . Coronary artery disease   . Hypercholesteremia   . Hypertension   . Hyperthyroidism   . Depression   . Carotid artery stenosis   . Stroke   . Cancer     breast cancer    Past Surgical History  Procedure Laterality Date  . Coronary artery bypass graft  2001    x 5 (left internal mammary artery to left anterior  descending coronary artery  . Breast lumpectomy  1999  . Carotid endarterectomy  04-2000    left  . Breast lumpectomy      left breast  . Abdominal hysterectomy      History   Social History  . Marital Status: Single    Spouse Name: N/A    Number of Children: 1  . Years of Education: N/A   Occupational History  . Social worker    Social History Main Topics  . Smoking status: Former Smoker    Types: Cigarettes    Quit date: 09/11/2001  . Smokeless tobacco: Not on file  . Alcohol Use: No  . Drug Use: No  . Sexual Activity: Not on file  Other Topics Concern  . Not on file   Social History Narrative  . No narrative on file    ROS: no fevers or chills, productive cough, hemoptysis, dysphasia, odynophagia, melena, hematochezia, dysuria, hematuria, rash, seizure activity, orthopnea, PND, pedal edema, claudication. Remaining systems are negative.  Physical Exam: Well-developed well-nourished in no acute distress.  Skin is warm and dry.  HEENT is normal.  Neck is supple.  Chest is clear to auscultation with normal expansion.  Cardiovascular exam is regular rate and rhythm.  Abdominal exam nontender or distended. No masses palpated. Extremities show no edema. neuro grossly intact  ECG Sinus rhythm at a rate of 77. Lateral T-wave inversion.

## 2014-04-27 NOTE — Assessment & Plan Note (Signed)
Continue aspirin and statin. 

## 2014-04-27 NOTE — Assessment & Plan Note (Addendum)
Continue statin. Lipids and liver monitored by primary care. 

## 2014-04-27 NOTE — Assessment & Plan Note (Signed)
Schedule followup carotid Dopplers. Continue aspirin and statin.

## 2014-05-04 ENCOUNTER — Ambulatory Visit (HOSPITAL_COMMUNITY)
Admission: RE | Admit: 2014-05-04 | Discharge: 2014-05-04 | Disposition: A | Discharge status: Home / Self Care | Payer: Medicare Other | Source: Ambulatory Visit | Attending: Cardiology | Admitting: Cardiology

## 2014-05-04 DIAGNOSIS — I2510 Atherosclerotic heart disease of native coronary artery without angina pectoris: Secondary | ICD-10-CM | POA: Insufficient documentation

## 2014-05-04 DIAGNOSIS — I10 Essential (primary) hypertension: Secondary | ICD-10-CM | POA: Insufficient documentation

## 2014-05-04 DIAGNOSIS — E7800 Pure hypercholesterolemia, unspecified: Secondary | ICD-10-CM | POA: Diagnosis present

## 2014-05-04 DIAGNOSIS — I6529 Occlusion and stenosis of unspecified carotid artery: Secondary | ICD-10-CM

## 2014-05-04 NOTE — Progress Notes (Signed)
Carotid Duplex Completed. Preliminary results by tech - Right ICA 1-49% stenosis and left CEA/ICA - widely patent without evidence of restenosis.  Oda Cogan, BS, RDMS, RVT

## 2014-05-05 ENCOUNTER — Other Ambulatory Visit: Payer: Self-pay | Admitting: Cardiology

## 2014-05-06 ENCOUNTER — Telehealth: Payer: Self-pay | Admitting: Cardiology

## 2014-05-06 ENCOUNTER — Other Ambulatory Visit: Payer: Self-pay

## 2014-05-06 MED ORDER — LISINOPRIL-HYDROCHLOROTHIAZIDE 20-25 MG PO TABS: ORAL_TABLET | ORAL | Status: DC

## 2014-05-06 NOTE — Telephone Encounter (Signed)
Pt still have not gotten her Lisinopril HCT 20/25 mg-Please call to [facility].Pt says she been waiting for this since 1st week of August,pt very very concerned about this.Pt says she this medicine asap.

## 2014-05-14 ENCOUNTER — Telehealth: Payer: Self-pay | Admitting: Cardiology

## 2014-05-14 NOTE — Telephone Encounter (Signed)
Spoke with pt, she has noticed her bp is fluctuating quite a lot. It is running a little high in the morning and then will be lower later in the day. We discussed her medicine and she will make sure she takes all her medicine in the day, she reports occ she will get confused. We also discussed diet and limiting salt and sodium intake. Patient voiced understanding, she is going to cont to monitor and call if bp cont to be elevated

## 2014-05-14 NOTE — Telephone Encounter (Signed)
Pt is calling in regards to her BP fluctuating and she is concerned. She would like to speak to the nurse about this to make sure nothing major is going on. Please call  Thanks

## 2014-06-08 ENCOUNTER — Emergency Department (INDEPENDENT_AMBULATORY_CARE_PROVIDER_SITE_OTHER)
Admission: EM | Admit: 2014-06-08 | Discharge: 2014-06-08 | Disposition: A | Discharge status: Home / Self Care | Payer: Medicare Other | Source: Home / Self Care | Attending: Emergency Medicine | Admitting: Emergency Medicine

## 2014-06-08 ENCOUNTER — Encounter (HOSPITAL_COMMUNITY): Payer: Self-pay | Admitting: Emergency Medicine

## 2014-06-08 DIAGNOSIS — H1131 Conjunctival hemorrhage, right eye: Secondary | ICD-10-CM

## 2014-06-08 DIAGNOSIS — H1130 Conjunctival hemorrhage, unspecified eye: Secondary | ICD-10-CM
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
## 2014-06-08 NOTE — Discharge Instructions (Signed)
Please follow up at Dr. Trish Fountain office this afternoon at 2:15pm.  Subconjunctival Hemorrhage A subconjunctival hemorrhage is a bright red patch covering a portion of the white of the eye. The white part of the eye is called the sclera, and it is covered by a thin membrane called the conjunctiva. This membrane is clear, except for tiny blood vessels that you can see with the naked eye. When your eye is irritated or inflamed and becomes red, it is because the vessels in the conjunctiva are swollen. Sometimes, a blood vessel in the conjunctiva can break and bleed. When this occurs, the blood builds up between the conjunctiva and the sclera, and spreads out to create a red area. The red spot may be very small at first. It may then spread to cover a larger part of the surface of the eye, or even all of the visible white part of the eye. In almost all cases, the blood will go away and the eye will become white again. Before completely dissolving, however, the red area may spread. It may also become brownish-yellow in color before going away. If a lot of blood collects under the conjunctiva, it may look like a bulge on the surface of the eye. This looks scary, but it will also eventually flatten out and go away. Subconjunctival hemorrhages do not cause pain, but if swollen, may cause a feeling of irritation. There is no effect on vision.  CAUSES   The most common cause is mild trauma (rubbing the eye, irritation).  Subconjunctival hemorrhages can happen because of coughing or straining (lifting heavy objects), vomiting, or sneezing.  In some cases, your doctor may want to check your blood pressure. High blood pressure can also cause a subconjunctival hemorrhage.  Severe trauma or blunt injuries.  Diseases that affect blood clotting (hemophilia, leukemia).  Abnormalities of blood vessels behind the eye (carotid cavernous sinus fistula).  Tumors behind the eye.  Certain drugs (aspirin, Coumadin,  heparin).  Recent eye surgery. HOME CARE INSTRUCTIONS   Do not worry about the appearance of your eye. You may continue your usual activities.  Often, follow-up is not necessary. SEEK MEDICAL CARE IF:   Your eye becomes painful.  The bleeding does not disappear within 3 weeks.  Bleeding occurs elsewhere, for example, under the skin, in the mouth, or in the other eye.  You have recurring subconjunctival hemorrhages. SEEK IMMEDIATE MEDICAL CARE IF:   Your vision changes or you have difficulty seeing.  You develop a severe headache, persistent vomiting, confusion, or abnormal drowsiness (lethargy).  Your eye seems to bulge or protrude from the eye socket.  You notice the sudden appearance of bruises or have spontaneous bleeding elsewhere on your body. Document Released: 08/28/2005 Document Revised: 01/12/2014 Document Reviewed: 07/26/2009 [facility] Patient Information 2015 [place], [place]. This information is not intended to replace advice given to you by your health care provider. Make sure you discuss any questions you have with your health care provider.
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
## 2014-06-10 NOTE — ED Provider Notes (Signed)
Medical screening examination/treatment/procedure(s) were performed by non-physician practitioner and as supervising physician I was immediately available for consultation/collaboration.  Philipp Deputy, M.D.  Harden Mo, MD 06/10/14 [phone]

## 2014-06-18 ENCOUNTER — Other Ambulatory Visit: Payer: Self-pay | Admitting: *Deleted

## 2014-06-18 DIAGNOSIS — I10 Essential (primary) hypertension: Secondary | ICD-10-CM

## 2014-06-18 DIAGNOSIS — I6523 Occlusion and stenosis of bilateral carotid arteries: Secondary | ICD-10-CM

## 2014-06-18 DIAGNOSIS — E785 Hyperlipidemia, unspecified: Secondary | ICD-10-CM

## 2014-06-18 DIAGNOSIS — I2510 Atherosclerotic heart disease of native coronary artery without angina pectoris: Secondary | ICD-10-CM

## 2014-06-18 DIAGNOSIS — E7800 Pure hypercholesterolemia, unspecified: Secondary | ICD-10-CM

## 2014-06-18 MED ORDER — ROSUVASTATIN CALCIUM 40 MG PO TABS
40.0000 mg | ORAL_TABLET | Freq: Every day | ORAL | Status: DC
Start: 2014-06-18 — End: 2018-03-02

## 2014-07-06 ENCOUNTER — Telehealth: Payer: Self-pay | Admitting: Cardiology

## 2014-07-06 NOTE — Telephone Encounter (Signed)
Need a refill on her Potassium Chloride. Please call to [facility] on [address] waiting for this for 2 weeks.She is calling from the pharmacy now.

## 2014-07-10 ENCOUNTER — Other Ambulatory Visit: Payer: Self-pay | Admitting: *Deleted

## 2014-07-10 MED ORDER — POTASSIUM CHLORIDE ER 10 MEQ PO TBCR: EXTENDED_RELEASE_TABLET | ORAL | Status: DC

## 2014-07-10 NOTE — Telephone Encounter (Signed)
Pt is very upset,still have not received her medicine. She called in to the office on the 10-26 and been waiting before that.Please call pt now,if possibe.Pt says she is on her way to the office.

## 2014-10-22 ENCOUNTER — Telehealth: Payer: Self-pay | Admitting: Oncology

## 2014-10-22 NOTE — Telephone Encounter (Signed)
Lft msg for pt confirming MD visit due to provider schedule, mailed sch

## 2015-01-26 ENCOUNTER — Ambulatory Visit (HOSPITAL_BASED_OUTPATIENT_CLINIC_OR_DEPARTMENT_OTHER): Payer: Medicare Other | Admitting: Oncology

## 2015-01-26 ENCOUNTER — Telehealth: Payer: Self-pay | Admitting: Oncology

## 2015-01-26 VITALS — BP 147/63 | HR 72 | Temp 98.4°F | Resp 18 | Ht 65.0 in | Wt 167.0 lb

## 2015-01-26 DIAGNOSIS — C50912 Malignant neoplasm of unspecified site of left female breast: Secondary | ICD-10-CM

## 2015-01-26 DIAGNOSIS — Z853 Personal history of malignant neoplasm of breast: Secondary | ICD-10-CM

## 2015-01-26 NOTE — Progress Notes (Signed)
  [name] OFFICE PROGRESS NOTE   Diagnosis: Breast cancer  INTERVAL HISTORY:   Brenda [name] returns as scheduled. She feels well. She now has mammograms via her GYN office and reports last having a mammogram in 2015. She plans to schedule a mammogram next month. No palpable change over either breast.  Objective:  Vital signs in last 24 hours:  Blood pressure 147/63, pulse 72, temperature 98.4 F (36.9 C), temperature source Oral, resp. rate 18, height 5\' 5"  (1.651 m), weight 167 lb (75.751 kg), SpO2 97 %.    HEENT: Neck without mass Lymphatics: No cervical, supraclavicular, or axillary nodes Resp: Lungs clear bilaterally Cardio: Regular rate and rhythm, 2/6 systolic murmur GI: No hepatomegaly Vascular: No leg edema Breasts: Status post left lumpectomy. Firm scar tissue at the lumpectomy site. No evidence for local tumor recurrence. No mass in either breast.      Medications: I have reviewed the patient's current medications.  Assessment/Plan: 1. Stage I left-sided breast cancer diagnosed December 1999.   Disposition:  Brenda [name] is in clinical remission from breast cancer. She would like to continue follow-up at the [facility]. She will return for a one-year office visit. She plans to schedule a mammogram for next month via [name]. She will ask Brenda [name] to send [place] a report.  Brenda Coder, MD  01/26/2015  3:24 PM

## 2015-01-26 NOTE — Telephone Encounter (Signed)
Gave and printed appt sched and avs fo rpt for May 2017 °

## 2015-01-28 ENCOUNTER — Ambulatory Visit: Payer: Medicare Other | Admitting: Oncology

## 2015-03-02 ENCOUNTER — Other Ambulatory Visit: Payer: Self-pay | Admitting: Cardiology

## 2015-03-02 MED ORDER — METOPROLOL SUCCINATE ER 50 MG PO TB24: 50.0000 mg | ORAL_TABLET | Freq: Every day | ORAL | Status: DC

## 2015-05-21 ENCOUNTER — Ambulatory Visit (INDEPENDENT_AMBULATORY_CARE_PROVIDER_SITE_OTHER): Payer: Medicare Other | Admitting: Internal Medicine

## 2015-05-21 ENCOUNTER — Encounter: Payer: Self-pay | Admitting: Internal Medicine

## 2015-05-21 ENCOUNTER — Encounter (INDEPENDENT_AMBULATORY_CARE_PROVIDER_SITE_OTHER): Payer: Self-pay

## 2015-05-21 ENCOUNTER — Other Ambulatory Visit (INDEPENDENT_AMBULATORY_CARE_PROVIDER_SITE_OTHER): Payer: Medicare Other

## 2015-05-21 VITALS — BP 180/100 | HR 63 | Temp 98.9°F | Resp 14 | Ht 65.0 in | Wt 164.0 lb

## 2015-05-21 DIAGNOSIS — I10 Essential (primary) hypertension: Secondary | ICD-10-CM

## 2015-05-21 DIAGNOSIS — E0590 Thyrotoxicosis, unspecified without thyrotoxic crisis or storm: Secondary | ICD-10-CM

## 2015-05-21 DIAGNOSIS — Z8673 Personal history of transient ischemic attack (TIA), and cerebral infarction without residual deficits: Secondary | ICD-10-CM | POA: Insufficient documentation

## 2015-05-21 DIAGNOSIS — I25810 Atherosclerosis of coronary artery bypass graft(s) without angina pectoris: Secondary | ICD-10-CM

## 2015-05-21 DIAGNOSIS — Z0000 Encounter for general adult medical examination without abnormal findings: Secondary | ICD-10-CM | POA: Diagnosis not present

## 2015-05-21 DIAGNOSIS — R413 Other amnesia: Secondary | ICD-10-CM | POA: Insufficient documentation

## 2015-05-21 DIAGNOSIS — E039 Hypothyroidism, unspecified: Secondary | ICD-10-CM

## 2015-05-21 DIAGNOSIS — E785 Hyperlipidemia, unspecified: Secondary | ICD-10-CM

## 2015-05-21 LAB — COMPREHENSIVE METABOLIC PANEL
ALT: 20 U/L (ref 0–35)
AST: 22 U/L (ref 0–37)
Albumin: 4.3 g/dL (ref 3.5–5.2)
Alkaline Phosphatase: 56 U/L (ref 39–117)
BUN: 17 mg/dL (ref 6–23)
CO2: 29 mEq/L (ref 19–32)
Calcium: 9.7 mg/dL (ref 8.4–10.5)
Chloride: 103 mEq/L (ref 96–112)
Creatinine, Ser: 0.7 mg/dL (ref 0.40–1.20)
GFR: 109.28 mL/min (ref >60.00)
Glucose, Bld: 90 mg/dL (ref 70–99)
Potassium: 3.9 mEq/L (ref 3.5–5.1)
Sodium: 138 mEq/L (ref 135–145)
Total Bilirubin: 0.9 mg/dL (ref 0.2–1.2)
Total Protein: 7.6 g/dL (ref 6.0–8.3)

## 2015-05-21 LAB — LIPID PANEL
Cholesterol: 156 mg/dL (ref 0–200)
HDL: 54.1 mg/dL (ref >39.00)
LDL Cholesterol: 86 mg/dL (ref 0–99)
NonHDL: 102.03
Total CHOL/HDL Ratio: 3
Triglycerides: 78 mg/dL (ref 0.0–149.0)
VLDL: 15.6 mg/dL (ref 0.0–40.0)

## 2015-05-21 LAB — HEMOGLOBIN A1C: Hgb A1c MFr Bld: 5.7 % (ref 4.6–6.5)

## 2015-05-21 LAB — T4, FREE: Free T4: 0.92 ng/dL (ref 0.60–1.60)

## 2015-05-21 LAB — TSH: TSH: 1.44 u[IU]/mL (ref 0.35–4.50)

## 2015-05-21 LAB — CBC
HCT: 40.8 % (ref 36.0–46.0)
Hemoglobin: 13.8 g/dL (ref 12.0–15.0)
MCHC: 33.8 g/dL (ref 30.0–36.0)
MCV: 94.1 fl (ref 78.0–100.0)
Platelets: 188 10*3/uL (ref 150.0–400.0)
RBC: 4.34 Mil/uL (ref 3.87–5.11)
RDW: 13.6 % (ref 11.5–15.5)
WBC: 9 10*3/uL (ref 4.0–10.5)

## 2015-05-21 LAB — VITAMIN B12: Vitamin B-12: 593 pg/mL (ref 211–911)

## 2015-05-21 LAB — HEPATITIS C ANTIBODY: HCV Ab: NEGATIVE

## 2015-05-21 LAB — FOLATE: Folate: 13.3 ng/mL (ref >5.9)

## 2015-05-21 NOTE — Assessment & Plan Note (Signed)
Will start workup for dementia with labs today. Unclear if she has true memory concerns. Due to acuity of other conditions unable to perform MMSE today. Suspect that her memory impairement is adversely affecting her other health conditions.

## 2015-05-21 NOTE — Assessment & Plan Note (Signed)
Currently not having anginal symptoms, continue beta blocker, ACE-I, aspirin daily for prevention. Talked to her about her increased risk with elevated pressure. She is aware.

## 2015-05-21 NOTE — Assessment & Plan Note (Signed)
Moderately elevated today and increased on recheck. It is unclear to me what she is taking and how often. Per computer reporting the last fill for most of her medicines was in June or July which would mean that she is only taking a couple of times per week. Will call pharmacy to verify no newer fills. Talked to her about restarting her pill boxes to help her remember to take her medicines. If she is taking her current regimen of amlodipine, lisinopril/hctz and metoprolol then needs increase and/or additional agent. Given her history of stroke it is vital to get her blood pressure under good control. Talked to her today about her risk of another stroke with high BP. Checking labs today before refills provided. Return quickly for re-evaluation.

## 2015-05-21 NOTE — Patient Instructions (Addendum)
We will check blood work on your today to check for problems that could cause the memory problems as well as the screening tests we talked about for hepatitis c and hiv.   We will call you back with the results and can then send in your refills if needed. I would recommend with your blood pressure that we should not keep you on mobic (meloxicam) but you can use tylenol for the arthritis pain which is safer for the blood pressure.   Health Maintenance Adopting a healthy lifestyle and getting preventive care can go a long way to promote health and wellness. Talk with your health care provider about what schedule of regular examinations is right for you. This is a good chance for you to check in with your provider about disease prevention and staying healthy. In between checkups, there are plenty of things you can do on your own. Experts have done a lot of research about which lifestyle changes and preventive measures are most likely to keep you healthy. Ask your health care provider for more information. WEIGHT AND DIET  Eat a healthy diet  Be sure to include plenty of vegetables, fruits, low-fat dairy products, and lean protein.  Do not eat a lot of foods high in solid fats, added sugars, or salt.  Get regular exercise. This is one of the most important things you can do for your health.  Most adults should exercise for at least 150 minutes each week. The exercise should increase your heart rate and make you sweat (moderate-intensity exercise).  Most adults should also do strengthening exercises at least twice a week. This is in addition to the moderate-intensity exercise.  Maintain a healthy weight  Body mass index (BMI) is a measurement that can be used to identify possible weight problems. It estimates body fat based on height and weight. Your health care provider can help determine your BMI and help you achieve or maintain a healthy weight.  For females 15 years of age and older:   A  BMI below 18.5 is considered underweight.  A BMI of 18.5 to 24.9 is normal.  A BMI of 25 to 29.9 is considered overweight.  A BMI of 30 and above is considered obese.  Watch levels of cholesterol and blood lipids  You should start having your blood tested for lipids and cholesterol at 61 years of age, then have this test every 5 years.  You may need to have your cholesterol levels checked more often if:  Your lipid or cholesterol levels are high.  You are older than 62 years of age.  You are at high risk for heart disease.  CANCER SCREENING   Lung Cancer  Lung cancer screening is recommended for adults 62-88 years old who are at high risk for lung cancer because of a history of smoking.  A yearly low-dose CT scan of the lungs is recommended for people who:  Currently smoke.  Have quit within the past 15 years.  Have at least a 30-pack-year history of smoking. A pack year is smoking an average of one pack of cigarettes a day for 1 year.  Yearly screening should continue until it has been 15 years since you quit.  Yearly screening should stop if you develop a health problem that would prevent you from having lung cancer treatment.  Breast Cancer  Practice breast self-awareness. This means understanding how your breasts normally appear and feel.  It also means doing regular breast self-exams. Let your health care  provider know about any changes, no matter how small.  If you are in your 20s or 30s, you should have a clinical breast exam (CBE) by a health care provider every 1-3 years as part of a regular health exam.  If you are 51 or older, have a CBE every year. Also consider having a breast X-ray (mammogram) every year.  If you have a family history of breast cancer, talk to your health care provider about genetic screening.  If you are at high risk for breast cancer, talk to your health care provider about having an MRI and a mammogram every year.  Breast cancer  gene (BRCA) assessment is recommended for women who have family members with BRCA-related cancers. BRCA-related cancers include:  Breast.  Ovarian.  Tubal.  Peritoneal cancers.  Results of the assessment will determine the need for genetic counseling and BRCA1 and BRCA2 testing. Cervical Cancer Routine pelvic examinations to screen for cervical cancer are no longer recommended for nonpregnant women who are considered low risk for cancer of the pelvic organs (ovaries, uterus, and vagina) and who do not have symptoms. A pelvic examination may be necessary if you have symptoms including those associated with pelvic infections. Ask your health care provider if a screening pelvic exam is right for you.   The Pap test is the screening test for cervical cancer for women who are considered at risk.  If you had a hysterectomy for a problem that was not cancer or a condition that could lead to cancer, then you no longer need Pap tests.  If you are older than 65 years, and you have had normal Pap tests for the past 10 years, you no longer need to have Pap tests.  If you have had past treatment for cervical cancer or a condition that could lead to cancer, you need Pap tests and screening for cancer for at least 20 years after your treatment.  If you no longer get a Pap test, assess your risk factors if they change (such as having a new sexual partner). This can affect whether you should start being screened again.  Some women have medical problems that increase their chance of getting cervical cancer. If this is the case for you, your health care provider may recommend more frequent screening and Pap tests.  The human papillomavirus (HPV) test is another test that may be used for cervical cancer screening. The HPV test looks for the virus that can cause cell changes in the cervix. The cells collected during the Pap test can be tested for HPV.  The HPV test can be used to screen women 77 years of age  and older. Getting tested for HPV can extend the interval between normal Pap tests from three to five years.  An HPV test also should be used to screen women of any age who have unclear Pap test results.  After 61 years of age, women should have HPV testing as often as Pap tests.  Colorectal Cancer  This type of cancer can be detected and often prevented.  Routine colorectal cancer screening usually begins at 61 years of age and continues through 61 years of age.  Your health care provider may recommend screening at an earlier age if you have risk factors for colon cancer.  Your health care provider may also recommend using home test kits to check for hidden blood in the stool.  A small camera at the end of a tube can be used to examine your colon  directly (sigmoidoscopy or colonoscopy). This is done to check for the earliest forms of colorectal cancer.  Routine screening usually begins at age 79.  Direct examination of the colon should be repeated every 5-10 years through 61 years of age. However, you may need to be screened more often if early forms of precancerous polyps or small growths are found. Skin Cancer  Check your skin from head to toe regularly.  Tell your health care provider about any new moles or changes in moles, especially if there is a change in a mole's shape or color.  Also tell your health care provider if you have a mole that is larger than the size of a pencil eraser.  Always use sunscreen. Apply sunscreen liberally and repeatedly throughout the day.  Protect yourself by wearing long sleeves, pants, a wide-brimmed hat, and sunglasses whenever you are outside. HEART DISEASE, DIABETES, AND HIGH BLOOD PRESSURE   Have your blood pressure checked at least every 1-2 years. High blood pressure causes heart disease and increases the risk of stroke.  If you are between 20 years and 13 years old, ask your health care provider if you should take aspirin to prevent  strokes.  Have regular diabetes screenings. This involves taking a blood sample to check your fasting blood sugar level.  If you are at a normal weight and have a low risk for diabetes, have this test once every three years after 61 years of age.  If you are overweight and have a high risk for diabetes, consider being tested at a younger age or more often. PREVENTING INFECTION  Hepatitis B  If you have a higher risk for hepatitis B, you should be screened for this virus. You are considered at high risk for hepatitis B if:  You were born in a country where hepatitis B is common. Ask your health care provider which countries are considered high risk.  Your parents were born in a high-risk country, and you have not been immunized against hepatitis B (hepatitis B vaccine).  You have HIV or AIDS.  You use needles to inject street drugs.  You live with someone who has hepatitis B.  You have had sex with someone who has hepatitis B.  You get hemodialysis treatment.  You take certain medicines for conditions, including cancer, organ transplantation, and autoimmune conditions. Hepatitis C  Blood testing is recommended for:  Everyone born from [birth year] through 1967.  Anyone with known risk factors for hepatitis C. Sexually transmitted infections (STIs)  You should be screened for sexually transmitted infections (STIs) including gonorrhea and chlamydia if:  You are sexually active and are younger than 61 years of age.  You are older than 61 years of age and your health care provider tells you that you are at risk for this type of infection.  Your sexual activity has changed since you were last screened and you are at an increased risk for chlamydia or gonorrhea. Ask your health care provider if you are at risk.  If you do not have HIV, but are at risk, it may be recommended that you take a prescription medicine daily to prevent HIV infection. This is called pre-exposure prophylaxis  (PrEP). You are considered at risk if:  You are sexually active and do not regularly use condoms or know the HIV status of your partner(s).  You take drugs by injection.  You are sexually active with a partner who has HIV. Talk with your health care provider about whether you are  at high risk of being infected with HIV. If you choose to begin PrEP, you should first be tested for HIV. You should then be tested every 3 months for as long as you are taking PrEP.  PREGNANCY   If you are premenopausal and you may become pregnant, ask your health care provider about preconception counseling.  If you may become pregnant, take 400 to 800 micrograms (mcg) of folic acid every day.  If you want to prevent pregnancy, talk to your health care provider about birth control (contraception). OSTEOPOROSIS AND MENOPAUSE   Osteoporosis is a disease in which the bones lose minerals and strength with aging. This can result in serious bone fractures. Your risk for osteoporosis can be identified using a bone density scan.  If you are 59 years of age or older, or if you are at risk for osteoporosis and fractures, ask your health care provider if you should be screened.  Ask your health care provider whether you should take a calcium or vitamin D supplement to lower your risk for osteoporosis.  Menopause may have certain physical symptoms and risks.  Hormone replacement therapy may reduce some of these symptoms and risks. Talk to your health care provider about whether hormone replacement therapy is right for you.  HOME CARE INSTRUCTIONS   Schedule regular health, dental, and eye exams.  Stay current with your immunizations.   Do not use any tobacco products including cigarettes, chewing tobacco, or electronic cigarettes.  If you are pregnant, do not drink alcohol.  If you are breastfeeding, limit how much and how often you drink alcohol.  Limit alcohol intake to no more than 1 drink per day for  nonpregnant women. One drink equals 12 ounces of beer, 5 ounces of wine, or 1 ounces of hard liquor.  Do not use street drugs.  Do not share needles.  Ask your health care provider for help if you need support or information about quitting drugs.  Tell your health care provider if you often feel depressed.  Tell your health care provider if you have ever been abused or do not feel safe at home. Document Released: 03/13/2011 Document Revised: 01/12/2014 Document Reviewed: 07/30/2013 [facility] Patient Information 2015 [place], [place]. This information is not intended to replace advice given to you by your health care provider. Make sure you discuss any questions you have with your health care provider.

## 2015-05-21 NOTE — Progress Notes (Signed)
   Subjective:    Patient ID: Brenda [name], female    DOB: Oct 05, 1955, 59 y.o.   MRN: [id number]  HPI The patient is a 59 YO female coming in new for many medical concerns. She has not had a primary care doctor in several years and has been going to several specialists. She did have a stroke some years ago and since that time has had trouble with her memory short term. She does not always remember to take her pills daily. She thinks that they make her drowsy and she tries to take them in the evening. She states that her heart doctor is giving her these medicines (only rx in our system from more than 1 year ago with only 1 year of refills). Has not been to see her heart doctor in more than 1 year. Please see A/P for further details.   PMH, [facility], social history reviewed and updated.   Review of Systems  Constitutional: Positive for activity change, appetite change and fatigue. Negative for fever, chills and unexpected weight change.  Respiratory: Negative for cough, chest tightness, shortness of breath and wheezing.   Cardiovascular: Negative for chest pain, palpitations and leg swelling.  Gastrointestinal: Negative for nausea, abdominal pain, diarrhea, constipation and abdominal distention.  Musculoskeletal: Positive for arthralgias. Negative for myalgias and back pain.  Skin: Negative.   Neurological: Positive for weakness and headaches. Negative for dizziness, syncope and light-headedness.       Memory problems  Psychiatric/Behavioral: Negative.       Objective:   Physical Exam  Constitutional: She is oriented to person, place, and time. She appears well-developed and well-nourished.  Overweight  HENT:  Head: Normocephalic and atraumatic.  Eyes: EOM are normal.  Neck: Normal range of motion.  Cardiovascular: Normal rate and regular rhythm.   Pulmonary/Chest: Effort normal and breath sounds normal. No respiratory distress. She has no wheezes. She has no rales.  Abdominal: Soft. Bowel  sounds are normal. She exhibits no distension. There is no tenderness. There is no rebound.  Musculoskeletal: She exhibits no edema.  Neurological: She is alert and oriented to person, place, and time. Coordination normal.  Skin: Skin is warm and dry.  Psychiatric: She has a normal mood and affect.  Easily off topic and hard to redirect.    Filed Vitals:   05/21/15 0926 05/21/15 1010  BP: 182/90 180/100  Pulse: 63   Temp: 98.9 F (37.2 C)   TempSrc: Oral   Resp: 14   Height: 5\' 5"  (1.651 m)   Weight: 164 lb (74.39 kg)   SpO2: 98%       Assessment & Plan:

## 2015-05-21 NOTE — Assessment & Plan Note (Signed)
Not currently on medication and cannot remember when the last blood work was with Dr. Forde Dandy so will check TSH and free T4 today. Adjust as needed.

## 2015-05-21 NOTE — Progress Notes (Signed)
Pre visit review using our clinic review tool, if applicable. No additional management support is needed unless otherwise documented below in the visit note. 

## 2015-05-22 LAB — HIV ANTIBODY (ROUTINE TESTING W REFLEX): HIV 1&2 Ab, 4th Generation: NONREACTIVE

## 2015-06-04 ENCOUNTER — Other Ambulatory Visit: Payer: Self-pay | Admitting: Cardiology

## 2015-06-04 NOTE — Telephone Encounter (Signed)
REFILL 

## 2015-06-14 ENCOUNTER — Other Ambulatory Visit: Payer: Self-pay | Admitting: Cardiology

## 2015-06-14 DIAGNOSIS — I6523 Occlusion and stenosis of bilateral carotid arteries: Secondary | ICD-10-CM

## 2015-06-17 ENCOUNTER — Inpatient Hospital Stay (HOSPITAL_COMMUNITY): Admission: RE | Admit: 2015-06-17 | Payer: Medicare Other | Source: Ambulatory Visit

## 2015-07-20 ENCOUNTER — Telehealth: Payer: Self-pay | Admitting: Cardiology

## 2015-07-20 NOTE — Telephone Encounter (Signed)
Spoke with pt, for the last couple weeks she has noticed SOB. It usually occurs with exertion and she will also notice some wheezing. She exercises daily and does get some tightness that is more related to stress, none with exercise. She is requesting to be seen. Follow up scheduled with PA. Patient voiced understanding of appt time and location.

## 2015-07-20 NOTE — Telephone Encounter (Signed)
Mrs. Cesaro is calling because she shortness of breath for about a week now , when she tries to do cardio and when she is talking her chest is getting tight . Please call   Thanks

## 2015-07-20 NOTE — Progress Notes (Signed)
Cardiology Office Note   Date:  07/21/2015   ID:  Brenda [name], DOB 1956-08-26, MRN [id number]  PCP:  [name], MD  Cardiologist:  Dr. Stanford Breed   Increased SOB     History of Present Illness: Brenda [name] is a 59 y.o. female with a history of CAD s/p CABG (2001), carotid artery disease s/p L CEA (2001), HTN, CVA, breast cancer s/p lumpectomy and HLD who presents to clinic for increased SOB.   Patient is status post coronary artery bypass graft in 2001 (LIMA to the LAD, sequential saphenous vein graft to the distal right coronary and PDA, sequential saphenous vein graft to the ramus and obtuse marginal). She also had carotid endarterectomy at that time as well. Echocardiogram in October of 2010 showed normal LV function and mild mitral regurgitation. Renal Dopplers in January of 2012 showed normal renal arteries and no abdominal aortic aneurysm. Last carotid Dopplers in 04/2014 showed a 40-59% right and L CEA widely patent, mild decrease in R ICA velocities when compared to previous study. Followup recommended in one year (04/2015). Nuclear study 7/14 showed EF 60% and no ischemia.   Today she presents to clinic for increased SOB. She goes to the gym everyday. She has been working out lately and feels like her endurance is not as good. She could only do the elliptical for 10 minutes which was not normal for her. No chest pain. No LE edema, PND, orthopnea, palpitations, dizziness or syncope. She went home and talked on the phone and felt SOB. She gets a little confused with her medications and ran out of amlodipine. Doesn't take her BP routinely at home. She has been taking all of her other medications. No HA or visual disturbance.     Past Medical History  Diagnosis Date  . Coronary artery disease   . Hypercholesteremia   . Hypertension   . Hyperthyroidism   . Depression   . Carotid artery stenosis   . Stroke ([place])   . Cancer [place])     breast cancer    Past Surgical  History  Procedure Laterality Date  . Coronary artery bypass graft  2001    x 5 (left internal mammary artery to left anterior  descending coronary artery  . Breast lumpectomy  1999  . Carotid endarterectomy  04-2000    left  . Breast lumpectomy      left breast  . Abdominal hysterectomy       Current Outpatient Prescriptions  Medication Sig Dispense Refill  . aspirin 81 MG tablet Take 81 mg by mouth daily.      . Cholecalciferol (VITAMIN D) 1000 UNITS capsule Take 2,000 Units by mouth daily.     Marland Kitchen lisinopril-hydrochlorothiazide (PRINZIDE,ZESTORETIC) 20-25 MG per tablet Take 1 tablet by mouth daily. NEED OV. 30 tablet 0  . Multiple Vitamin (MULTIVITAMIN) tablet Take 1 tablet by mouth daily.      . nitroGLYCERIN (NITROSTAT) 0.4 MG SL tablet Place 0.4 mg under the tongue every 5 (five) minutes as needed for chest pain (x 3 tabs daily).     . potassium chloride (K-DUR) 10 MEQ tablet TAKE ONE TABLET BY MOUTH EVERY DAY 90 tablet 3  . rosuvastatin (CRESTOR) 40 MG tablet Take 1 tablet (40 mg total) by mouth daily. 90 tablet 3   No current facility-administered medications for this visit.    Allergies:   Latex    Social History:  The patient  reports that she quit smoking about 13 years  ago. Her smoking use included Cigarettes. She does not have any smokeless tobacco history on file. She reports that she does not drink alcohol or use illicit drugs.   Family History:  The patient's family history includes Cancer in her mother; Lung cancer in her mother; Other in an other family member.    ROS:  Please see the history of present illness.   Otherwise, review of systems are positive for NONE   All other systems are reviewed and negative.    PHYSICAL EXAM: VS:  BP 230/94 mmHg  Pulse 70  Ht 5\' 5"  (1.651 m)  Wt 166 lb (75.297 kg)  BMI 27.62 kg/m2 , BMI Body mass index is 27.62 kg/(m^2). GEN: Well nourished, well developed, in no acute distress HEENT: normal Neck: no JVD, carotid bruits,  or masses Cardiac: RRR; no murmurs, rubs, or gallops,no edema  Respiratory:  clear to auscultation bilaterally, normal work of breathing GI: soft, nontender, nondistended, + BS MS: no deformity or atrophy Skin: warm and dry, no rash Neuro:  Strength and sensation are intact Psych: euthymic mood, full affect   EKG:  EKG is ordered today. The ekg ordered today demonstrates NSR with PAC , non specific TW changes similar to previous.   Recent Labs: 05/21/2015: ALT 20; BUN 17; Creatinine, Ser 0.70; Hemoglobin 13.8; Platelets 188.0; Potassium 3.9; Sodium 138; TSH 1.44    Lipid Panel    Component Value Date/Time   CHOL 156 05/21/2015 1010   TRIG 78.0 05/21/2015 1010   HDL 54.10 05/21/2015 1010   CHOLHDL 3 05/21/2015 1010   VLDL 15.6 05/21/2015 1010   LDLCALC 86 05/21/2015 1010   LDLDIRECT 151.5 04/03/2013 0832      Wt Readings from Last 3 Encounters:  07/21/15 166 lb (75.297 kg)  05/21/15 164 lb (74.39 kg)  01/26/15 167 lb (75.751 kg)      Other studies Reviewed: Additional studies/ records that were reviewed today include: carotid dopplers, nuclear stress test, 3D ECHO Review of the above records demonstrates: see HPI.    ASSESSMENT AND PLAN:  Brenda [name] is a 59 y.o. female with a history of CAD s/p CABG (2001), carotid artery disease s/p L CEA (2001), HTN, and HLD who presents to clinic for increased SOB.   CAD s/p CABG- she is having increased SOB. ECG with non specific TW changes similar to previous tracings.  -- Will plan to update stress test. Will have her set up for ETT nuclear stress test.   Carotid artery disease-  Last carotid Dopplers in 04/2014 showed a 40-59% right and L CEA widely patent, mild decrease in R ICA velocities when compared to previous study. Followup recommended in one year (04/2015). It doesn't appear that this has been done. Will have this arranged  HLD- continue Crestor 40mg . Lipids and LFTs followed by PCP.  HTN- BP markedly elevated today  230/94. She has run out of amlodipine 10mg . We will resume this, continue Prinzine 20-25mg  and increase Toprol XL 50mg  to 100mg  po qd ( HR 70 today). She will monitor her BP at home and call us if it doesn't improve. I rechecked her BP in the office and it remains elevated with SBP 220 I have given her 0.1mg  clonidine in the office and rechecked it ~200  SBP.    Current medicines are reviewed at length with the patient today.  The patient does not have concerns regarding medicines.  The following changes have been made:  Increase Toprol 50mg  to 100mg  daily. Resume amlodipine  and prinzide.   Labs/ tests ordered today include:  No orders of the defined types were placed in this encounter.     Disposition:   FU with Dr. Stanford Breed in December as previously scheduled.   Renea Ee  07/21/2015 11:38 AM    [facility] [address], [place], [place]  [address] Phone: [phone]; Fax: [phone]

## 2015-07-21 ENCOUNTER — Encounter: Payer: Self-pay | Admitting: Physician Assistant

## 2015-07-21 ENCOUNTER — Ambulatory Visit (INDEPENDENT_AMBULATORY_CARE_PROVIDER_SITE_OTHER): Payer: Medicare Other | Admitting: Physician Assistant

## 2015-07-21 VITALS — BP 230/94 | HR 70 | Ht 65.0 in | Wt 166.0 lb

## 2015-07-21 DIAGNOSIS — I10 Essential (primary) hypertension: Secondary | ICD-10-CM | POA: Diagnosis not present

## 2015-07-21 DIAGNOSIS — E7800 Pure hypercholesterolemia, unspecified: Secondary | ICD-10-CM

## 2015-07-21 DIAGNOSIS — R0602 Shortness of breath: Secondary | ICD-10-CM | POA: Diagnosis not present

## 2015-07-21 DIAGNOSIS — I2510 Atherosclerotic heart disease of native coronary artery without angina pectoris: Secondary | ICD-10-CM

## 2015-07-21 MED ORDER — CLONIDINE HCL 0.1 MG PO TABS
0.1000 mg | ORAL_TABLET | ORAL | Status: AC
Start: 2015-07-21 — End: 2015-07-21
  Administered 2015-07-21: 0.1 mg via ORAL

## 2015-07-21 MED ORDER — METOPROLOL SUCCINATE ER 100 MG PO TB24: 100.0000 mg | ORAL_TABLET | Freq: Every day | ORAL | Status: DC

## 2015-07-21 MED ORDER — LISINOPRIL-HYDROCHLOROTHIAZIDE 20-25 MG PO TABS
1.0000 | ORAL_TABLET | Freq: Every day | ORAL | Status: DC
Start: 2015-07-21 — End: 2016-08-24

## 2015-07-21 MED ORDER — AMLODIPINE BESYLATE 10 MG PO TABS: 10.0000 mg | ORAL_TABLET | Freq: Every day | ORAL | Status: DC

## 2015-07-21 NOTE — Patient Instructions (Signed)
Medication Instructions:  Your physician has recommended you make the following change in your medication:  1. Increase Toprol ( 100 mg ) daily 2.  I sent in prescriptions for Prinzide and Amlodipine   Labwork: -None  Testing/Procedures: Your physician has requested that you have en exercise stress myoview. For further information please visit [email or web address]. Please follow instruction sheet, as given.  Your physician has requested that you have a carotid duplex. This test is an ultrasound of the carotid arteries in your neck. It looks at blood flow through these arteries that supply the brain with blood. Allow one hour for this exam. There are no restrictions or special instructions.      Follow-Up: Your physician recommends that you keep your scheduled  follow-up appointment with Dr. Stanford Breed   Any Other Special Instructions Will Be Listed Below (If Applicable).  Patient was given clonidine 0.1 mg at office visit today due to elevated bp.   If you need a refill on your cardiac medications before your next appointment, please call your pharmacy.

## 2015-07-25 ENCOUNTER — Other Ambulatory Visit: Payer: Self-pay | Admitting: Cardiology

## 2015-07-28 ENCOUNTER — Telehealth (HOSPITAL_COMMUNITY): Payer: Self-pay

## 2015-07-28 NOTE — Telephone Encounter (Signed)
Patient given detailed instructions per Myocardial Perfusion Study Information Sheet for the test on 07-30-2015 at 0745. Patient notified to arrive 15 minutes early and that it is imperative to arrive on time for appointment to keep from having the test rescheduled.  If you need to cancel or reschedule your appointment, please call the office within 24 hours of your appointment. Failure to do so may result in a cancellation of your appointment, and a $50 no show fee. Patient verbalized understanding.[name], [name]

## 2015-07-30 ENCOUNTER — Ambulatory Visit (HOSPITAL_COMMUNITY)
Admission: RE | Admit: 2015-07-30 | Discharge: 2015-07-30 | Disposition: A | Discharge status: Home / Self Care | Payer: Medicare Other | Source: Ambulatory Visit | Attending: Cardiovascular Disease | Admitting: Cardiovascular Disease

## 2015-07-30 ENCOUNTER — Ambulatory Visit (HOSPITAL_BASED_OUTPATIENT_CLINIC_OR_DEPARTMENT_OTHER): Payer: Medicare Other

## 2015-07-30 DIAGNOSIS — I10 Essential (primary) hypertension: Secondary | ICD-10-CM | POA: Insufficient documentation

## 2015-07-30 DIAGNOSIS — E7800 Pure hypercholesterolemia, unspecified: Secondary | ICD-10-CM

## 2015-07-30 DIAGNOSIS — I6523 Occlusion and stenosis of bilateral carotid arteries: Secondary | ICD-10-CM | POA: Diagnosis not present

## 2015-07-30 DIAGNOSIS — I2510 Atherosclerotic heart disease of native coronary artery without angina pectoris: Secondary | ICD-10-CM | POA: Diagnosis not present

## 2015-07-30 DIAGNOSIS — E785 Hyperlipidemia, unspecified: Secondary | ICD-10-CM | POA: Diagnosis not present

## 2015-07-30 DIAGNOSIS — R0602 Shortness of breath: Secondary | ICD-10-CM

## 2015-07-30 DIAGNOSIS — Z951 Presence of aortocoronary bypass graft: Secondary | ICD-10-CM | POA: Diagnosis not present

## 2015-07-30 DIAGNOSIS — R5383 Other fatigue: Secondary | ICD-10-CM | POA: Insufficient documentation

## 2015-07-30 LAB — MYOCARDIAL PERFUSION IMAGING
LV dias vol: 77 mL
LV sys vol: 28 mL
Peak HR: 102 {beats}/min
RATE: 0.3
Rest HR: 52 {beats}/min
SDS: 3
SRS: 2
SSS: 5
TID: 1.14

## 2015-07-30 MED ORDER — TECHNETIUM TC 99M SESTAMIBI GENERIC - CARDIOLITE
30.8000 | Freq: Once | INTRAVENOUS | Status: AC | PRN
  Administered 2015-07-30: 30.8 via INTRAVENOUS

## 2015-07-30 MED ORDER — REGADENOSON 0.4 MG/5ML IV SOLN
0.4000 mg | Freq: Once | INTRAVENOUS | Status: AC
  Administered 2015-07-30: 0.4 mg via INTRAVENOUS

## 2015-07-30 MED ORDER — TECHNETIUM TC 99M SESTAMIBI GENERIC - CARDIOLITE
10.1000 | Freq: Once | INTRAVENOUS | Status: AC | PRN
  Administered 2015-07-30: 10.1 via INTRAVENOUS

## 2015-08-02 ENCOUNTER — Telehealth: Payer: Self-pay | Admitting: Cardiology

## 2015-08-02 NOTE — Telephone Encounter (Signed)
Returned call to patient Brenda [name] results given.Advised to keep appointment with Dr.Crenshaw 08/12/15 at 8:30 am.

## 2015-08-02 NOTE — Telephone Encounter (Signed)
New message ° ° ° ° ° °Calling to get stress test results °

## 2015-08-03 NOTE — Progress Notes (Signed)
HPI: FU coronary artery disease and cerebrovascular disease. Patient is status post coronary artery bypass graft in 2001 (LIMA to the LAD, sequential saphenous vein graft to the distal right coronary and PDA, sequential saphenous vein graft to the ramus and obtuse marginal). She also had carotid endarterectomy at that time as well. Echocardiogram in October of 2010 showed normal LV function and mild mitral regurgitation. Renal Dopplers in January of 2012 showed normal renal arteries and no abdominal aortic aneurysm. Seen recently for increased dyspnea. Nuclear study November 2016 showed ejection fraction 64%. Possible small area of distal ischemia but also could be related to shifting breast attenuation. Carotid Dopplers November 2016 showed 40-59% right and 1-30% left stenosis. Follow-up two years. Since last seen,   Current Outpatient Prescriptions  Medication Sig Dispense Refill  . amLODipine (NORVASC) 10 MG tablet Take 1 tablet (10 mg total) by mouth daily. 30 tablet 11  . aspirin 81 MG tablet Take 81 mg by mouth daily.      . Cholecalciferol (VITAMIN D) 1000 UNITS capsule Take 2,000 Units by mouth daily.     Marland Kitchen lisinopril-hydrochlorothiazide (PRINZIDE,ZESTORETIC) 20-25 MG tablet Take 1 tablet by mouth daily. 30 tablet 11  . metoprolol succinate (TOPROL-XL) 100 MG 24 hr tablet Take 1 tablet (100 mg total) by mouth daily. Take with or immediately following a meal. 30 tablet 11  . Multiple Vitamin (MULTIVITAMIN) tablet Take 1 tablet by mouth daily.      . nitroGLYCERIN (NITROSTAT) 0.4 MG SL tablet Place 0.4 mg under the tongue every 5 (five) minutes as needed for chest pain (x 3 tabs daily).     . potassium chloride (K-DUR) 10 MEQ tablet TAKE ONE TABLET BY MOUTH ONCE DAILY 90 tablet 3  . rosuvastatin (CRESTOR) 40 MG tablet Take 1 tablet (40 mg total) by mouth daily. 90 tablet 3   No current facility-administered medications for this visit.     Past Medical History  Diagnosis Date  .  Coronary artery disease   . Hypercholesteremia   . Hypertension   . Hyperthyroidism   . Depression   . Carotid artery stenosis   . Stroke ([place])   . Cancer [facility])     breast cancer    Past Surgical History  Procedure Laterality Date  . Coronary artery bypass graft  2001    x 5 (left internal mammary artery to left anterior  descending coronary artery  . Breast lumpectomy  1999  . Carotid endarterectomy  04-2000    left  . Breast lumpectomy      left breast  . Abdominal hysterectomy      Social History   Social History  . Marital Status: Single    Spouse Name: N/A  . Number of Children: 1  . Years of Education: N/A   Occupational History  . Social worker    Social History Main Topics  . Smoking status: Former Smoker    Types: Cigarettes    Quit date: 09/11/2001  . Smokeless tobacco: Not on file  . Alcohol Use: No  . Drug Use: No  . Sexual Activity: Not on file   Other Topics Concern  . Not on file   Social History Narrative    ROS: no fevers or chills, productive cough, hemoptysis, dysphasia, odynophagia, melena, hematochezia, dysuria, hematuria, rash, seizure activity, orthopnea, PND, pedal edema, claudication. Remaining systems are negative.  Physical Exam: Well-developed well-nourished in no acute distress.  Skin is warm and dry.  HEENT is  normal.  Neck is supple.  Chest is clear to auscultation with normal expansion.  Cardiovascular exam is regular rate and rhythm.  Abdominal exam nontender or distended. No masses palpated. Extremities show no edema. neuro grossly intact  ECG     This encounter was created in error - please disregard.

## 2015-08-12 ENCOUNTER — Encounter: Payer: Medicare Other | Admitting: Cardiology

## 2015-08-20 ENCOUNTER — Encounter: Payer: Self-pay | Admitting: Internal Medicine

## 2015-08-20 ENCOUNTER — Ambulatory Visit (INDEPENDENT_AMBULATORY_CARE_PROVIDER_SITE_OTHER): Payer: Medicare Other | Admitting: Internal Medicine

## 2015-08-20 VITALS — BP 140/78 | HR 66 | Temp 98.3°F | Resp 14 | Ht 65.0 in | Wt 164.0 lb

## 2015-08-20 DIAGNOSIS — G5601 Carpal tunnel syndrome, right upper limb: Secondary | ICD-10-CM

## 2015-08-20 DIAGNOSIS — I10 Essential (primary) hypertension: Secondary | ICD-10-CM

## 2015-08-20 DIAGNOSIS — Z23 Encounter for immunization: Secondary | ICD-10-CM

## 2015-08-20 MED ORDER — NITROGLYCERIN 0.4 MG SL SUBL: 0.4000 mg | SUBLINGUAL_TABLET | SUBLINGUAL | Status: DC | PRN

## 2015-08-20 NOTE — Progress Notes (Addendum)
   Subjective:    Patient ID: Brenda [name], female    DOB: [birth year], 59 y.o.   MRN: [id number]  HPI The patient is a 59 YO female coming in for new problem of numbness in her right 1st and 2nd finger. This has been going on for many years and she has had carpal tunnel in the past. She has had it injected. Some pain and mostly numb a lot of the time. Not really worsening but she is noticing it more. Occasionally interferes with her ability to do things. Has not tried anything for it recently. Overall pain is 3/10 when it hurts.   Review of Systems  Constitutional: Positive for activity change. Negative for fever, chills, appetite change, fatigue and unexpected weight change.       Doing more  Respiratory: Negative for cough, chest tightness, shortness of breath and wheezing.   Cardiovascular: Negative for chest pain, palpitations and leg swelling.  Gastrointestinal: Negative for nausea, abdominal pain, diarrhea, constipation and abdominal distention.  Musculoskeletal: Positive for arthralgias. Negative for myalgias and back pain.  Skin: Negative.   Neurological: Positive for weakness and numbness. Negative for dizziness, syncope and light-headedness.       Memory problems  Psychiatric/Behavioral: Negative.       Objective:   Physical Exam  Constitutional: She is oriented to person, place, and time. She appears well-developed and well-nourished.  Overweight  HENT:  Head: Normocephalic and atraumatic.  Eyes: EOM are normal.  Neck: Normal range of motion.  Cardiovascular: Normal rate and regular rhythm.   Pulmonary/Chest: Effort normal and breath sounds normal. No respiratory distress. She has no wheezes. She has no rales.  Abdominal: Soft. Bowel sounds are normal. She exhibits no distension. There is no tenderness. There is no rebound.  Musculoskeletal: She exhibits no edema.  Tinnel sign positive  Neurological: She is alert and oriented to person, place, and time. Coordination normal.    Skin: Skin is warm and dry.  Psychiatric: She has a normal mood and affect.  Better historian than last time.   Filed Vitals:   08/20/15 1005 08/20/15 1034  BP: 190/100 140/78  Pulse: 66   Temp: 98.3 F (36.8 C)   TempSrc: Oral   Resp: 14   Height: 5\' 5"  (1.651 m)   Weight: 164 lb (74.39 kg)   SpO2: 97%       Assessment & Plan:  Flu shot given at visit.

## 2015-08-20 NOTE — Progress Notes (Signed)
Pre visit review using our clinic review tool, if applicable. No additional management support is needed unless otherwise documented below in the visit note. 

## 2015-08-20 NOTE — Assessment & Plan Note (Signed)
She has not taken her medicines today and BP recheck is more normal. She does need to take her medicines every day to avoid another stroke and reminded her about this. She is taking lisinopril/hctz and amlodipine and metoprolol. Have talked to her about setting an alarm on her phone to make sure that she takes her medicines and using a pill box so she can know if she has taken them. Recent stress test low risk.

## 2015-08-20 NOTE — Patient Instructions (Signed)
We have given you the flu shot today and are not checking any blood work.   We are not changing your medicines today but keep track of the blood pressure at home and if it stays high like this after you take your medicine call us.

## 2015-08-20 NOTE — Assessment & Plan Note (Signed)
Talked to her about referral to hand surgeon for carpal tunnel release and she will think about it. In the meantime she will try braces at night time.

## 2015-09-15 NOTE — Progress Notes (Signed)
HPI: FU coronary artery disease and cerebrovascular disease. Patient is status post coronary artery bypass graft in 2001 (LIMA to the LAD, sequential saphenous vein graft to the distal right coronary and PDA, sequential saphenous vein graft to the ramus and obtuse marginal). She also had carotid endarterectomy at that time as well. Echocardiogram in October of 2010 showed normal LV function and mild mitral regurgitation. Renal Dopplers in January of 2012 showed normal renal arteries and no abdominal aortic aneurysm. Carotid Dopplers November 2016 showed 40-59% right and 1-30% left stenosis. Follow-up recommended 2 years. Nuclear study November 2016 showed a small apical defect felt secondary to soft tissue attenuation but mild ischemia cannot be excluded. Blood pressure noted to be elevated in the office in November 2016 and adjustments made. Since last seen the patient has dyspnea with more extreme activities but not with routine activities. It is relieved with rest. It is not associated with chest pain. There is no orthopnea, PND or pedal edema. There is no syncope or palpitations. There is no exertional chest pain.   Current Outpatient Prescriptions  Medication Sig Dispense Refill  . amLODipine (NORVASC) 10 MG tablet Take 1 tablet (10 mg total) by mouth daily. 30 tablet 11  . aspirin 81 MG tablet Take 81 mg by mouth daily.      . Cholecalciferol (VITAMIN D) 1000 UNITS capsule Take 2,000 Units by mouth daily.     Marland Kitchen lisinopril-hydrochlorothiazide (PRINZIDE,ZESTORETIC) 20-25 MG tablet Take 1 tablet by mouth daily. 30 tablet 11  . metoprolol succinate (TOPROL-XL) 100 MG 24 hr tablet Take 1 tablet (100 mg total) by mouth daily. Take with or immediately following a meal. 30 tablet 11  . Multiple Vitamin (MULTIVITAMIN) tablet Take 1 tablet by mouth daily.      . nitroGLYCERIN (NITROSTAT) 0.4 MG SL tablet Place 1 tablet (0.4 mg total) under the tongue every 5 (five) minutes as needed for chest pain  (x 3 tabs daily). 20 tablet 1  . potassium chloride (K-DUR) 10 MEQ tablet TAKE ONE TABLET BY MOUTH ONCE DAILY 90 tablet 3  . rosuvastatin (CRESTOR) 40 MG tablet Take 1 tablet (40 mg total) by mouth daily. 90 tablet 3   No current facility-administered medications for this visit.     Past Medical History  Diagnosis Date  . Coronary artery disease   . Hypercholesteremia   . Hypertension   . Hyperthyroidism   . Depression   . Carotid artery stenosis   . Stroke ([place])   . Cancer [facility])     breast cancer    Past Surgical History  Procedure Laterality Date  . Coronary artery bypass graft  2001    x 5 (left internal mammary artery to left anterior  descending coronary artery  . Breast lumpectomy  1999  . Carotid endarterectomy  04-2000    left  . Breast lumpectomy      left breast  . Abdominal hysterectomy      Social History   Social History  . Marital Status: Single    Spouse Name: N/A  . Number of Children: 1  . Years of Education: N/A   Occupational History  . Social worker    Social History Main Topics  . Smoking status: Former Smoker    Types: Cigarettes    Quit date: 09/11/2001  . Smokeless tobacco: Not on file  . Alcohol Use: No  . Drug Use: No  . Sexual Activity: Not on file   Other Topics Concern  .  Not on file   Social History Narrative    ROS: Some hip arthralgias and depression but no fevers or chills, productive cough, hemoptysis, dysphasia, odynophagia, melena, hematochezia, dysuria, hematuria, rash, seizure activity, orthopnea, PND, pedal edema, claudication. Remaining systems are negative.  Physical Exam: Well-developed well-nourished in no acute distress.  Skin is warm and dry.  HEENT is normal.  Neck is supple.  Chest is clear to auscultation with normal expansion.  Cardiovascular exam is regular rate and rhythm.  Abdominal exam nontender or distended. No masses palpated. Extremities show no edema. neuro grossly intact  ECG  07/21/2015-sinus rhythm with PACs, right atrial enlargement, left ventricular hypertrophy, nonspecific ST changes, prolonged QT interval.

## 2015-09-17 ENCOUNTER — Encounter: Payer: Self-pay | Admitting: Cardiology

## 2015-09-17 ENCOUNTER — Ambulatory Visit (INDEPENDENT_AMBULATORY_CARE_PROVIDER_SITE_OTHER): Payer: Medicare Other | Admitting: Cardiology

## 2015-09-17 VITALS — BP 122/70 | HR 80 | Ht 65.0 in | Wt 169.2 lb

## 2015-09-17 DIAGNOSIS — E7800 Pure hypercholesterolemia, unspecified: Secondary | ICD-10-CM

## 2015-09-17 DIAGNOSIS — I10 Essential (primary) hypertension: Secondary | ICD-10-CM | POA: Diagnosis not present

## 2015-09-17 DIAGNOSIS — I2510 Atherosclerotic heart disease of native coronary artery without angina pectoris: Secondary | ICD-10-CM | POA: Diagnosis not present

## 2015-09-17 NOTE — Assessment & Plan Note (Signed)
Continue aspirin and statin. Nuclear study low risk. Plan medical therapy.

## 2015-09-17 NOTE — Assessment & Plan Note (Signed)
Continue statin. 

## 2015-09-17 NOTE — Patient Instructions (Signed)
Your physician wants you to follow-up in: ONE YEAR WITH DR CRENSHAW You will receive a reminder letter in the mail two months in advance. If you don't receive a letter, please call our office to schedule the follow-up appointment.   If you need a refill on your cardiac medications before your next appointment, please call your pharmacy.  

## 2015-09-17 NOTE — Assessment & Plan Note (Signed)
Plan follow-up carotid Dopplers November 2018. Continue aspirin and statin.

## 2015-09-17 NOTE — Assessment & Plan Note (Signed)
Blood pressure controlled. Continue present medications. 

## 2015-09-29 ENCOUNTER — Encounter: Payer: Self-pay | Admitting: Internal Medicine

## 2015-09-29 ENCOUNTER — Ambulatory Visit (INDEPENDENT_AMBULATORY_CARE_PROVIDER_SITE_OTHER): Payer: Medicare Other | Admitting: Internal Medicine

## 2015-09-29 VITALS — BP 170/70 | HR 69 | Temp 98.5°F | Resp 16 | Ht 65.0 in | Wt 171.1 lb

## 2015-09-29 DIAGNOSIS — M5441 Lumbago with sciatica, right side: Secondary | ICD-10-CM | POA: Diagnosis not present

## 2015-09-29 DIAGNOSIS — M545 Low back pain, unspecified: Secondary | ICD-10-CM | POA: Insufficient documentation

## 2015-09-29 DIAGNOSIS — M5442 Lumbago with sciatica, left side: Secondary | ICD-10-CM

## 2015-09-29 NOTE — Patient Instructions (Signed)

## 2015-09-29 NOTE — Assessment & Plan Note (Signed)
She struggled through the pain and was able to keep [place] with her numerous exercise classes. Used heat and salon pas with good success. Likely a strain and given some stretching exercises to help prevent future re-injury. No medications or imaging indicated today.

## 2015-09-29 NOTE — Progress Notes (Signed)
Pre visit review using our clinic review tool, if applicable. No additional management support is needed unless otherwise documented below in the visit note. 

## 2015-09-29 NOTE — Progress Notes (Signed)
   Subjective:    Patient ID: Brenda [name], female    DOB: September 16, 1955, 60 y.o.   MRN: [id number]  HPI The patient is a 60 YO female coming in for low back pain that was radiating to her legs. She was doing a body pump class then a yoga class right afterwards. She does a lot of exercise classes weekly. Pain started suddenly and she used heat and salon pas on the area. Now it is gone from her legs and only mild ache in her low back. She has been doing her classes and spinning through this time. Gradually improving. Never happened before. Did not take any medicine for it. No fevers or chills or weight change.   Review of Systems  Constitutional: Negative for fever, chills, activity change, appetite change, fatigue and unexpected weight change.  Respiratory: Negative for cough, chest tightness, shortness of breath and wheezing.   Cardiovascular: Negative for chest pain, palpitations and leg swelling.  Gastrointestinal: Negative for nausea, abdominal pain, diarrhea, constipation and abdominal distention.  Musculoskeletal: Positive for back pain and arthralgias. Negative for myalgias.  Skin: Negative.   Neurological: Negative for dizziness, syncope and light-headedness.       Memory problems  Psychiatric/Behavioral: Negative.       Objective:   Physical Exam  Constitutional: She is oriented to person, place, and time. She appears well-developed and well-nourished.  Overweight  HENT:  Head: Normocephalic and atraumatic.  Eyes: EOM are normal.  Neck: Normal range of motion.  Cardiovascular: Normal rate and regular rhythm.   Pulmonary/Chest: Effort normal and breath sounds normal. No respiratory distress. She has no wheezes. She has no rales.  Abdominal: Soft. Bowel sounds are normal. She exhibits no distension. There is no tenderness. There is no rebound.  Musculoskeletal: She exhibits no edema.  No tenderness in the low back or spine at all even with pushing and twisting.   Neurological: She  is alert and oriented to person, place, and time. Coordination normal.  Skin: Skin is warm and dry.   Filed Vitals:   09/29/15 0849  BP: 170/70  Pulse: 69  Temp: 98.5 F (36.9 C)  TempSrc: Oral  Resp: 16  Height: 5\' 5"  (1.651 m)  Weight: 171 lb 1.9 oz (77.62 kg)  SpO2: 98%      Assessment & Plan:

## 2016-01-07 ENCOUNTER — Telehealth: Payer: Self-pay | Admitting: Oncology

## 2016-01-07 NOTE — Telephone Encounter (Signed)
LEFT MSG FOR APT CHANGE TO 5/22

## 2016-01-25 ENCOUNTER — Ambulatory Visit: Payer: Medicare Other | Admitting: Oncology

## 2016-01-31 ENCOUNTER — Ambulatory Visit: Payer: Medicare Other | Admitting: Oncology

## 2016-01-31 ENCOUNTER — Telehealth: Payer: Self-pay | Admitting: Oncology

## 2016-01-31 NOTE — Telephone Encounter (Signed)
pt called to resched 5/22 apt ... confirmed new apt for 5/24

## 2016-02-02 ENCOUNTER — Ambulatory Visit: Payer: Medicare Other | Admitting: Nurse Practitioner

## 2016-02-02 ENCOUNTER — Other Ambulatory Visit: Payer: Self-pay

## 2016-02-02 ENCOUNTER — Telehealth: Payer: Self-pay | Admitting: Nurse Practitioner

## 2016-02-02 NOTE — Telephone Encounter (Signed)
called to resched missed apt, spoke w/ pt & confirmed new apt for 5/26

## 2016-02-04 ENCOUNTER — Encounter: Payer: Self-pay | Admitting: Nurse Practitioner

## 2016-02-04 ENCOUNTER — Telehealth: Payer: Self-pay | Admitting: Oncology

## 2016-02-04 ENCOUNTER — Ambulatory Visit (HOSPITAL_BASED_OUTPATIENT_CLINIC_OR_DEPARTMENT_OTHER): Payer: Medicare Other | Admitting: Nurse Practitioner

## 2016-02-04 ENCOUNTER — Telehealth: Payer: Self-pay | Admitting: Nurse Practitioner

## 2016-02-04 VITALS — BP 170/76 | HR 63 | Temp 97.6°F | Resp 18 | Ht 65.0 in | Wt 167.6 lb

## 2016-02-04 DIAGNOSIS — C50912 Malignant neoplasm of unspecified site of left female breast: Secondary | ICD-10-CM

## 2016-02-04 DIAGNOSIS — I10 Essential (primary) hypertension: Secondary | ICD-10-CM

## 2016-02-04 DIAGNOSIS — Z853 Personal history of malignant neoplasm of breast: Secondary | ICD-10-CM | POA: Diagnosis not present

## 2016-02-04 NOTE — Telephone Encounter (Signed)
per pof to sch pt appt-gave pt copy of avs °

## 2016-02-04 NOTE — Progress Notes (Signed)
  [place] OFFICE PROGRESS NOTE   Diagnosis:  Breast cancer  INTERVAL HISTORY:   Brenda [name] returns for routine follow-up. No change over either breast. She denies pain. Overall she has a good appetite. She reports she will be due for a mammogram in June. She notes persistent memory issues related to a stroke. She sees a therapist for depression.  Objective:  Vital signs in last 24 hours:  Blood pressure 170/76, pulse 63, temperature 97.6 F (36.4 C), temperature source Oral, resp. rate 18, height 5\' 5"  (1.651 m), weight 167 lb 9.6 oz (76.023 kg), SpO2 100 %. Manual blood pressure 170/76    HEENT: No thrush or ulcers. Lymphatics: No palpable cervical, supraclavicular or axillary lymph nodes. Resp: Lungs clear bilaterally. Cardio: Regular rate and rhythm. 2/6 systolic murmur. GI: Abdomen soft and nontender. No hepatomegaly. Vascular: No leg edema. Breasts: Status post left lumpectomy. Firm scar tissue at the lumpectomy site. No evidence of local tumor recurrence. No mass palpated in either breast.   Lab Results:  Lab Results  Component Value Date   WBC 9.0 05/21/2015   HGB 13.8 05/21/2015   HCT 40.8 05/21/2015   MCV 94.1 05/21/2015   PLT 188.0 05/21/2015    Imaging:  No results found.  Medications: I have reviewed the patient's current medications.  Assessment/Plan: 1. Stage I left-sided breast cancer diagnosed December 1999.   Disposition:Brenda [name] remains in clinical remission from breast cancer. She is due for an annual mammogram next month. She will request a copy of the report be sent to our office. We scheduled a return visit in one year. She will contact the office prior to that visit with any problems.  Her blood pressure was elevated on multiple readings in the office today. Manual blood pressure was 170/76. We discussed the elevated blood pressure. She thinks she did not take her blood pressure medication yesterday. We discussed the  importance of good blood pressure control. She will check her blood pressure at home and contact Dr. Sharlet Salina with persistent elevated readings.  Ned Card ANP/GNP-BC   02/04/2016  12:43 PM

## 2016-02-21 ENCOUNTER — Ambulatory Visit (HOSPITAL_COMMUNITY)
Admission: EM | Admit: 2016-02-21 | Discharge: 2016-02-21 | Disposition: A | Discharge status: Home / Self Care | Payer: Medicare Other

## 2016-02-23 ENCOUNTER — Ambulatory Visit: Payer: Medicare Other | Admitting: Family

## 2016-02-24 ENCOUNTER — Ambulatory Visit: Payer: Medicare Other | Admitting: Family

## 2016-02-24 DIAGNOSIS — Z0289 Encounter for other administrative examinations: Secondary | ICD-10-CM

## 2016-02-25 ENCOUNTER — Encounter: Payer: Self-pay | Admitting: Internal Medicine

## 2016-02-25 ENCOUNTER — Ambulatory Visit (INDEPENDENT_AMBULATORY_CARE_PROVIDER_SITE_OTHER): Payer: Medicare Other | Admitting: Internal Medicine

## 2016-02-25 VITALS — BP 180/88 | HR 62 | Temp 98.5°F | Resp 14 | Ht 65.0 in | Wt 166.0 lb

## 2016-02-25 DIAGNOSIS — J309 Allergic rhinitis, unspecified: Secondary | ICD-10-CM | POA: Insufficient documentation

## 2016-02-25 DIAGNOSIS — J302 Other seasonal allergic rhinitis: Secondary | ICD-10-CM | POA: Diagnosis not present

## 2016-02-25 DIAGNOSIS — I10 Essential (primary) hypertension: Secondary | ICD-10-CM | POA: Diagnosis not present

## 2016-02-25 MED ORDER — HYDROCODONE-HOMATROPINE 5-1.5 MG/5ML PO SYRP: 5.0000 mL | ORAL_SOLUTION | Freq: Three times a day (TID) | ORAL | Status: DC | PRN

## 2016-02-25 MED ORDER — FLUTICASONE PROPIONATE 50 MCG/ACT NA SUSP: 2.0000 | Freq: Every day | NASAL | Status: DC

## 2016-02-25 NOTE — Progress Notes (Signed)
Pre visit review using our clinic review tool, if applicable. No additional management support is needed unless otherwise documented below in the visit note. 

## 2016-02-25 NOTE — Patient Instructions (Signed)
We have sent in flonase (fluticasone) nose spray. Use 2 sprays in each nostril daily. This can take 2-3 days to work fully.   The cough may last 2-3 weeks before going away fully.   Allergic Rhinitis Allergic rhinitis is when the mucous membranes in the nose respond to allergens. Allergens are particles in the air that cause your body to have an allergic reaction. This causes you to release allergic antibodies. Through a chain of events, these eventually cause you to release histamine into the blood stream. Although meant to protect the body, it is this release of histamine that causes your discomfort, such as frequent sneezing, congestion, and an itchy, runny nose.  CAUSES Seasonal allergic rhinitis (hay fever) is caused by pollen allergens that may come from grasses, trees, and weeds. Year-round allergic rhinitis (perennial allergic rhinitis) is caused by allergens such as house dust mites, pet dander, and mold spores. SYMPTOMS  Nasal stuffiness (congestion).  Itchy, runny nose with sneezing and tearing of the eyes. DIAGNOSIS Your health care provider can help you determine the allergen or allergens that trigger your symptoms. If you and your health care provider are unable to determine the allergen, skin or blood testing may be used. Your health care provider will diagnose your condition after taking your health history and performing a physical exam. Your health care provider may assess you for other related conditions, such as asthma, pink eye, or an ear infection. TREATMENT Allergic rhinitis does not have a cure, but it can be controlled by:  Medicines that block allergy symptoms. These may include allergy shots, nasal sprays, and oral antihistamines.  Avoiding the allergen. Hay fever may often be treated with antihistamines in pill or nasal spray forms. Antihistamines block the effects of histamine. There are over-the-counter medicines that may help with nasal congestion and swelling  around the eyes. Check with your health care provider before taking or giving this medicine. If avoiding the allergen or the medicine prescribed do not work, there are many new medicines your health care provider can prescribe. Stronger medicine may be used if initial measures are ineffective. Desensitizing injections can be used if medicine and avoidance does not work. Desensitization is when a patient is given ongoing shots until the body becomes less sensitive to the allergen. Make sure you follow up with your health care provider if problems continue. HOME CARE INSTRUCTIONS It is not possible to completely avoid allergens, but you can reduce your symptoms by taking steps to limit your exposure to them. It helps to know exactly what you are allergic to so that you can avoid your specific triggers. SEEK MEDICAL CARE IF:  You have a fever.  You develop a cough that does not stop easily (persistent).  You have shortness of breath.  You start wheezing.  Symptoms interfere with normal daily activities.   This information is not intended to replace advice given to you by your health care provider. Make sure you discuss any questions you have with your health care provider.   Document Released: 05/23/2001 Document Revised: 09/18/2014 Document Reviewed: 05/05/2013 Elsevier Interactive Patient Education [employer].

## 2016-02-25 NOTE — Assessment & Plan Note (Signed)
Rx for flonase and hycodan for her symptoms. Does not require antibiotics. Lung exam clear. Talked to her about expected course of cough for several weeks.

## 2016-02-25 NOTE — Assessment & Plan Note (Signed)
Did not take meds for several days due to sick, declines change to regimen today. Also taking otc cold medicine.

## 2016-02-25 NOTE — Progress Notes (Signed)
   Subjective:    Patient ID: Brenda [name], female    DOB: 1955-11-08, 60 y.o.   MRN: [id number]  HPI The patient is a 60 YO female coming in for cold symptoms and cold going on for 1 week. Overall it is worsening so she came in to be seen. She has tried some OTC allergy medicine and robitussin. No fevers or chills. No ear pain or hearing change. Overall the ear pain and throat pain is improved. She is still having nasal drainage and cough. No SOB or breathing problems. She was mowing the grass right before this started.   Review of Systems  Constitutional: Negative for fever, chills, activity change, appetite change, fatigue and unexpected weight change.  HENT: Positive for congestion, postnasal drip and rhinorrhea. Negative for dental problem, ear discharge, ear pain, sinus pressure, sore throat and trouble swallowing.   Eyes: Negative.   Respiratory: Positive for cough. Negative for chest tightness, shortness of breath and wheezing.   Cardiovascular: Negative for chest pain, palpitations and leg swelling.  Gastrointestinal: Negative for nausea, abdominal pain, diarrhea, constipation and abdominal distention.  Musculoskeletal: Positive for back pain and arthralgias. Negative for myalgias.  Skin: Negative.   Neurological: Negative for dizziness, syncope and light-headedness.  Psychiatric/Behavioral: Negative.       Objective:   Physical Exam  Constitutional: She is oriented to person, place, and time. She appears well-developed and well-nourished.  HENT:  Head: Normocephalic and atraumatic.  Right and left TM normal, oropharynx with redness and clear drainage, nasal turbinates with redness and swelling, no crusting, no sinus tenderness.   Eyes: EOM are normal. Pupils are equal, round, and reactive to light.  Neck: Normal range of motion.  Cardiovascular: Normal rate and regular rhythm.   Pulmonary/Chest: Effort normal and breath sounds normal. No respiratory distress. She has no wheezes.  She has no rales.  Abdominal: Soft.  Lymphadenopathy:    She has no cervical adenopathy.  Neurological: She is alert and oriented to person, place, and time.  Skin: Skin is warm and dry.   Filed Vitals:   02/25/16 0814  BP: 180/88  Pulse: 62  Temp: 98.5 F (36.9 C)  TempSrc: Oral  Resp: 14  Height: 5\' 5"  (1.651 m)  Weight: 166 lb (75.297 kg)  SpO2: 98%      Assessment & Plan:

## 2016-06-27 ENCOUNTER — Encounter (HOSPITAL_COMMUNITY): Payer: Self-pay | Admitting: Emergency Medicine

## 2016-06-27 ENCOUNTER — Ambulatory Visit (HOSPITAL_COMMUNITY)
Admission: EM | Admit: 2016-06-27 | Discharge: 2016-06-27 | Disposition: A | Discharge status: Home / Self Care | Payer: Medicare Other | Attending: Family Medicine | Admitting: Family Medicine

## 2016-06-27 DIAGNOSIS — S46911A Strain of unspecified muscle, fascia and tendon at shoulder and upper arm level, right arm, initial encounter: Secondary | ICD-10-CM

## 2016-06-27 MED ORDER — CYCLOBENZAPRINE HCL 5 MG PO TABS: 5.0000 mg | ORAL_TABLET | Freq: Every day | ORAL | 0 refills | Status: DC

## 2016-06-27 MED ORDER — DICLOFENAC SODIUM 75 MG PO TBEC: 75.0000 mg | DELAYED_RELEASE_TABLET | Freq: Two times a day (BID) | ORAL | 0 refills | Status: DC

## 2016-06-27 NOTE — ED Notes (Signed)
Pt d/c by Dr. Joseph Art

## 2016-06-27 NOTE — ED Provider Notes (Signed)
[place]    CSN: [id number] Arrival date & time: 06/27/16  1006     History   Chief Complaint Chief Complaint  Patient presents with  . Shoulder Pain    HPI Brenda [name] is a 60 y.o. female.   This is 60 year old woman who has come in because of right shoulder pain in her trapezius. 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 She's also had problems with breast cancer and congestive heart failure.  She was in her normal healthy state until she did a workout one week ago. At that time she was lifting weights and felt some burning in her trapezius area. She's had no radiating pain and has no neck pain. She's lost no power in her arms but has continued to have difficulty sleeping because of the soreness in her trapezius area that extends from her neck to her shoulder blade.      Past Medical History:  Diagnosis Date  . Cancer [facility])    breast cancer  . Carotid artery stenosis   . Coronary artery disease   . Depression   . Hypercholesteremia   . Hypertension   . Hyperthyroidism   . Stroke [facility])     Patient Active Problem List   Diagnosis Date Noted  . Allergic rhinitis 02/25/2016  . Low back pain 09/29/2015  . Carpal tunnel syndrome, right 08/20/2015  . Hx of completed stroke 05/21/2015  . Memory impairment 05/21/2015  . Hyperthyroidism 12/29/2008  . HYPERCHOLESTEROLEMIA 12/29/2008  . DEPRESSION 12/29/2008  . Essential hypertension 12/29/2008  . Coronary atherosclerosis 12/29/2008  . CAROTID ARTERY STENOSIS 12/29/2008    Past Surgical History:  Procedure Laterality Date  . ABDOMINAL HYSTERECTOMY    . BREAST LUMPECTOMY  1999  . BREAST LUMPECTOMY     left breast  . CAROTID ENDARTERECTOMY  04-2000   left  . CORONARY ARTERY BYPASS GRAFT  2001   x 5 (left internal mammary artery to left anterior  descending coronary artery    OB History    No data available       Home Medications    Prior to Admission medications   Medication Sig  Start Date End Date Taking? Authorizing Provider  aspirin 81 MG tablet Take 81 mg by mouth daily.     Yes Historical Provider, MD  Cholecalciferol (VITAMIN D) 1000 UNITS capsule Take 2,000 Units by mouth daily.    Yes Historical Provider, MD  fluticasone (FLONASE) 50 MCG/ACT nasal spray Place 2 sprays into both nostrils daily. 02/25/16  Yes [name], MD  metoprolol succinate (TOPROL-XL) 100 MG 24 hr tablet Take 1 tablet (100 mg total) by mouth daily. Take with or immediately following a meal. 07/21/15  Yes Eileen Stanford, PA-C  Multiple Vitamin (MULTIVITAMIN) tablet Take 1 tablet by mouth daily.     Yes Historical Provider, MD  potassium chloride (K-DUR) 10 MEQ tablet TAKE ONE TABLET BY MOUTH ONCE DAILY 07/27/15  Yes Lelon Perla, MD  rosuvastatin (CRESTOR) 20 MG tablet Reported on 02/25/2016 12/29/15  Yes Historical Provider, MD  amLODipine (NORVASC) 10 MG tablet Take 1 tablet (10 mg total) by mouth daily. 07/21/15   Eileen Stanford, PA-C  cyclobenzaprine (FLEXERIL) 5 MG tablet Take 1 tablet (5 mg total) by mouth at bedtime. 06/27/16   Robyn Haber, MD  diclofenac (VOLTAREN) 75 MG EC tablet Take 1 tablet (75 mg total) by mouth 2 (two) times daily. 06/27/16   Robyn Haber, MD  HYDROcodone-homatropine [facility]) 5-1.5 MG/5ML  syrup Take 5 mLs by mouth every 8 (eight) hours as needed for cough. 02/25/16   [name], MD  lisinopril-hydrochlorothiazide (PRINZIDE,ZESTORETIC) 20-25 MG tablet Take 1 tablet by mouth daily. 07/21/15   Eileen Stanford, PA-C  nitroGLYCERIN (NITROSTAT) 0.4 MG SL tablet Place 1 tablet (0.4 mg total) under the tongue every 5 (five) minutes as needed for chest pain (x 3 tabs daily). 08/20/15   [name], MD  rosuvastatin (CRESTOR) 40 MG tablet Take 1 tablet (40 mg total) by mouth daily. 06/18/14   Lelon Perla, MD    Family History Family History  Problem Relation Age of Onset  . Lung cancer Mother     died at an early age  .  Cancer Mother     lung  . Other      There is no Hx of premature coronary artery disease in her family    Social History Social History  Substance Use Topics  . Smoking status: Former Smoker    Types: Cigarettes    Quit date: 09/11/2001  . Smokeless tobacco: Never Used  . Alcohol use No     Allergies   Latex   Review of Systems Review of Systems  Constitutional: Negative.   HENT: Negative.   Respiratory: Negative.   Cardiovascular: Negative.   Musculoskeletal: Positive for myalgias. Negative for neck pain and neck stiffness.     Physical Exam Triage Vital Signs ED Triage Vitals  Enc Vitals Group     BP 06/27/16 1044 192/93     Pulse Rate 06/27/16 1044 (!) 57     Resp 06/27/16 1044 16     Temp 06/27/16 1044 98.6 F (37 C)     Temp Source 06/27/16 1044 Oral     SpO2 06/27/16 1044 100 %     Weight --      Height --      Head Circumference --      Peak Flow --      Pain Score 06/27/16 1051 9     Pain Loc --      Pain Edu? --      Excl. in [place]? --    No data found.   Updated Vital Signs BP 192/93 (BP Location: Right Arm)   Pulse (!) 57   Temp 98.6 F (37 C) (Oral)   Resp 16   SpO2 100%       Physical Exam  Constitutional: She appears well-developed and well-nourished.  Pulmonary/Chest: Effort normal.  Musculoskeletal:  The trapezius muscle on the right is tight but patient has full range of motion of her neck and her upper arm. She has well-developed musculature and minimal tenderness.  Skin: Skin is warm and dry.  Nursing note and vitals reviewed.    UC Treatments / Results  Labs (all labs ordered are listed, but only abnormal results are displayed) Labs Reviewed - No data to display  EKG  EKG Interpretation None       Radiology No results found.  Procedures Procedures (including critical care time)  Medications Ordered in UC Medications - No data to display   Initial Impression / Assessment and Plan / UC Course  I have  reviewed the triage vital signs and the nursing notes.  Pertinent labs & imaging results that were available during my care of the patient were reviewed by me and considered in my medical decision making (see chart for details).  Clinical Course      Final Clinical Impressions(s) /  UC Diagnoses   Final diagnoses:  Strain of right shoulder, initial encounter    New Prescriptions New Prescriptions   CYCLOBENZAPRINE (FLEXERIL) 5 MG TABLET    Take 1 tablet (5 mg total) by mouth at bedtime.   DICLOFENAC (VOLTAREN) 75 MG EC TABLET    Take 1 tablet (75 mg total) by mouth 2 (two) times daily.     Robyn Haber, MD 06/27/16 1128

## 2016-06-27 NOTE — ED Triage Notes (Signed)
Pt here for constant right shoulder pain onset 4 days associated w/mild HA  Reports pain increases w/activity  States she has been exercising and just finished living the gym about 30 min ++ ago   Denies SOB, diaphoresis, HA, n/v, blurred vision, weakness  A&O x4... NAD

## 2016-08-24 ENCOUNTER — Other Ambulatory Visit: Payer: Self-pay

## 2016-08-24 DIAGNOSIS — E7800 Pure hypercholesterolemia, unspecified: Secondary | ICD-10-CM

## 2016-08-24 DIAGNOSIS — I10 Essential (primary) hypertension: Secondary | ICD-10-CM

## 2016-08-24 DIAGNOSIS — R0602 Shortness of breath: Secondary | ICD-10-CM

## 2016-08-24 DIAGNOSIS — I2510 Atherosclerotic heart disease of native coronary artery without angina pectoris: Secondary | ICD-10-CM

## 2016-08-24 MED ORDER — METOPROLOL SUCCINATE ER 100 MG PO TB24: 100.0000 mg | ORAL_TABLET | Freq: Every day | ORAL | 1 refills | Status: DC

## 2016-08-24 MED ORDER — LISINOPRIL-HYDROCHLOROTHIAZIDE 20-25 MG PO TABS: 1.0000 | ORAL_TABLET | Freq: Every day | ORAL | 1 refills | Status: DC

## 2016-09-11 ENCOUNTER — Other Ambulatory Visit: Payer: Self-pay | Admitting: Cardiology

## 2016-09-12 ENCOUNTER — Encounter (HOSPITAL_COMMUNITY): Payer: Self-pay | Admitting: Emergency Medicine

## 2016-09-12 ENCOUNTER — Ambulatory Visit (HOSPITAL_COMMUNITY)
Admission: EM | Admit: 2016-09-12 | Discharge: 2016-09-12 | Disposition: A | Discharge status: Home / Self Care | Payer: Medicare Other | Attending: Family Medicine | Admitting: Family Medicine

## 2016-09-12 DIAGNOSIS — M79674 Pain in right toe(s): Secondary | ICD-10-CM

## 2016-09-12 MED ORDER — MELOXICAM 7.5 MG PO TABS: 7.5000 mg | ORAL_TABLET | Freq: Every day | ORAL | 0 refills | Status: DC

## 2016-09-12 NOTE — ED Provider Notes (Signed)
CSN: [id number]     Arrival date & time 09/12/16  1339 History   None    Chief Complaint  Patient presents with  . Foot Pain   (Consider location/radiation/quality/duration/timing/severity/associated sxs/prior Treatment) 61 year old female presents to clinic with chief complaint of right toe pain with discoloration. Patient reports pain has been getting worse after last two to three days, reports she is a runner, running 3 miles plus a day. She denies trauma to the toe, has not noticed any redness or inflammation to the joint. States the nail has turned darker over last few weeks as well.   The history is provided by the patient.  Foot Pain     Past Medical History:  Diagnosis Date  . Cancer [facility])    breast cancer  . Carotid artery stenosis   . Coronary artery disease   . Depression   . Hypercholesteremia   . Hypertension   . Hyperthyroidism   . Stroke [facility])    Past Surgical History:  Procedure Laterality Date  . ABDOMINAL HYSTERECTOMY    . BREAST LUMPECTOMY  1999  . BREAST LUMPECTOMY     left breast  . CAROTID ENDARTERECTOMY  04-2000   left  . CORONARY ARTERY BYPASS GRAFT  2001   x 5 (left internal mammary artery to left anterior  descending coronary artery   Family History  Problem Relation Age of Onset  . Lung cancer Mother     died at an early age  . Cancer Mother     lung  . Other      There is no Hx of premature coronary artery disease in her family   Social History  Substance Use Topics  . Smoking status: Former Smoker    Types: Cigarettes    Quit date: 09/11/2001  . Smokeless tobacco: Never Used  . Alcohol use No   OB History    No data available     Review of Systems  Constitutional: Negative.   HENT: Negative.   Respiratory: Negative.   Cardiovascular: Negative.   Musculoskeletal: Negative for arthralgias and gait problem.  Skin: Negative.  Negative for color change and wound.  Neurological: Negative.   Hematological: Negative.      Allergies  Latex  Home Medications   Prior to Admission medications   Medication Sig Start Date End Date Taking? Authorizing Provider  amLODipine (NORVASC) 10 MG tablet Take 1 tablet (10 mg total) by mouth daily. 07/21/15   Eileen Stanford, PA-C  aspirin 81 MG tablet Take 81 mg by mouth daily.      Historical Provider, MD  Cholecalciferol (VITAMIN D) 1000 UNITS capsule Take 2,000 Units by mouth daily.     Historical Provider, MD  cyclobenzaprine (FLEXERIL) 5 MG tablet Take 1 tablet (5 mg total) by mouth at bedtime. Patient not taking: Reported on 09/12/2016 06/27/16   Robyn Haber, MD  diclofenac (VOLTAREN) 75 MG EC tablet Take 1 tablet (75 mg total) by mouth 2 (two) times daily. Patient not taking: Reported on 09/12/2016 06/27/16   Robyn Haber, MD  fluticasone [facility]) 50 MCG/ACT nasal spray Place 2 sprays into both nostrils daily. Patient not taking: Reported on 09/12/2016 02/25/16   [name], MD  HYDROcodone-homatropine [facility]) 5-1.5 MG/5ML syrup Take 5 mLs by mouth every 8 (eight) hours as needed for cough. Patient not taking: Reported on 09/12/2016 02/25/16   [name], MD  lisinopril-hydrochlorothiazide (PRINZIDE,ZESTORETIC) 20-25 MG tablet Take 1 tablet by mouth daily. 08/24/16  Lelon Perla, MD  meloxicam (MOBIC) 7.5 MG tablet Take 1 tablet (7.5 mg total) by mouth daily. 09/12/16 09/22/16  Barnet Glasgow, NP  metoprolol succinate (TOPROL-XL) 100 MG 24 hr tablet Take 1 tablet (100 mg total) by mouth daily. Take with or immediately following a meal. 08/24/16   Lelon Perla, MD  Multiple Vitamin (MULTIVITAMIN) tablet Take 1 tablet by mouth daily.      Historical Provider, MD  nitroGLYCERIN (NITROSTAT) 0.4 MG SL tablet Place 1 tablet (0.4 mg total) under the tongue every 5 (five) minutes as needed for chest pain (x 3 tabs daily). 08/20/15   [name], MD  potassium chloride (K-DUR) 10 MEQ tablet TAKE ONE TABLET BY MOUTH ONCE DAILY 09/12/16    Lelon Perla, MD  rosuvastatin (CRESTOR) 20 MG tablet Reported on 02/25/2016 12/29/15   Historical Provider, MD  rosuvastatin (CRESTOR) 40 MG tablet Take 1 tablet (40 mg total) by mouth daily. 06/18/14   Lelon Perla, MD   Meds Ordered and Administered this Visit  Medications - No data to display  BP (!) 234/79 (BP Location: Right Arm)   Pulse 64   Temp 98.6 F (37 C) (Oral)   Resp 18   SpO2 96%  No data found.   Physical Exam  Constitutional: She is oriented to person, place, and time. She appears well-developed and well-nourished. No distress.  Cardiovascular: Normal rate and regular rhythm.   Pulmonary/Chest: Effort normal.  Abdominal: Soft.  Musculoskeletal:       Feet:  Neurological: She is alert and oriented to person, place, and time.  Skin: Skin is warm and dry. Capillary refill takes less than 2 seconds. She is not diaphoretic. No erythema. No pallor.  Psychiatric: She has a normal mood and affect.  Nursing note and vitals reviewed.   Urgent Care Course   Clinical Course     Procedures (including critical care time)  Labs Review Labs Reviewed - No data to display  Imaging Review No results found.   Visual Acuity Review  Right Eye Distance:   Left Eye Distance:   Bilateral Distance:    Right Eye Near:   Left Eye Near:    Bilateral Near:         MDM   1. Pain of right great toe    Most likely over use injury to the toe. Patient is a runner with new shoes. Patient advised to rest the affected extremity, she may use ice, elevate it, and use compression as needed. She has been given a prescription of mobic for pain and advised to follow up with her PCP should symptoms worsen or fail to improve. Additionally, patient advised to take her BP medicine as prescribed and follow up with PCP should it fail to decrease.    Barnet Glasgow, NP 09/12/16 1504

## 2016-09-12 NOTE — ED Triage Notes (Signed)
Top of right great toe, toenail itself is very painful.  No known injury, notices a whit patch to toenail color that is different.  Patient reports when pain occurs, pain is significant, sharp, stabbing, tender to touch on top of toe, not bottom-side of toe

## 2016-09-12 NOTE — Discharge Instructions (Signed)
Take your blood pressure medicines. Your blood pressure is very high today. With regard to your toe pain, you have been prescribed a medicine called Mobic. Drink plenty of fluids with this medicine. Rest the affected extremity, ice as needed. Should pain fail to improve or worsen follow up with your primary care provider or return to clinic.

## 2016-09-12 NOTE — Telephone Encounter (Signed)
Rx has been sent to the pharmacy electronically. ° °

## 2016-09-13 NOTE — Progress Notes (Signed)
HPI:FU coronary artery disease and cerebrovascular disease. Patient is status post coronary artery bypass graft in 2001 (LIMA to the LAD, sequential saphenous vein graft to the distal right coronary and PDA, sequential saphenous vein graft to the ramus and obtuse marginal). She also had carotid endarterectomy at that time as well. Echocardiogram in October of 2010 showed normal LV function and mild mitral regurgitation. Renal Dopplers in January of 2012 showed normal renal arteries and no abdominal aortic aneurysm. Carotid Dopplers November 2016 showed 40-59% right and 1-30% left stenosis. Follow-up recommended 2 years. Nuclear study November 2016 showed a small apical defect felt secondary to soft tissue attenuation but mild ischemia cannot be excluded. Since last seen the patient denies any dyspnea on exertion, orthopnea, PND, pedal edema, palpitations, syncope or chest pain.the patient denies any dyspnea on exertion, orthopnea, PND, pedal edema, palpitations, syncope or chest pain.    Current Outpatient Prescriptions  Medication Sig Dispense Refill  . amLODipine (NORVASC) 10 MG tablet Take 1 tablet (10 mg total) by mouth daily. 30 tablet 11  . aspirin 81 MG tablet Take 81 mg by mouth daily.      . Cholecalciferol (VITAMIN D) 1000 UNITS capsule Take 2,000 Units by mouth daily.     Marland Kitchen lisinopril-hydrochlorothiazide (PRINZIDE,ZESTORETIC) 20-25 MG tablet Take 1 tablet by mouth daily. 30 tablet 1  . metoprolol succinate (TOPROL-XL) 100 MG 24 hr tablet Take 1 tablet (100 mg total) by mouth daily. Take with or immediately following a meal. 30 tablet 1  . Multiple Vitamin (MULTIVITAMIN) tablet Take 1 tablet by mouth daily.      . nitroGLYCERIN (NITROSTAT) 0.4 MG SL tablet Place 1 tablet (0.4 mg total) under the tongue every 5 (five) minutes as needed for chest pain (x 3 tabs daily). 20 tablet 1  . potassium chloride (K-DUR) 10 MEQ tablet TAKE ONE TABLET BY MOUTH ONCE DAILY 90 tablet 0  .  rosuvastatin (CRESTOR) 40 MG tablet Take 1 tablet (40 mg total) by mouth daily. 90 tablet 3   No current facility-administered medications for this visit.      Past Medical History:  Diagnosis Date  . Cancer [facility])    breast cancer  . Carotid artery stenosis   . Coronary artery disease   . Depression   . Hypercholesteremia   . Hypertension   . Hyperthyroidism   . Stroke [facility])     Past Surgical History:  Procedure Laterality Date  . ABDOMINAL HYSTERECTOMY    . BREAST LUMPECTOMY  1999  . BREAST LUMPECTOMY     left breast  . CAROTID ENDARTERECTOMY  04-2000   left  . CORONARY ARTERY BYPASS GRAFT  2001   x 5 (left internal mammary artery to left anterior  descending coronary artery    Social History   Social History  . Marital status: Single    Spouse name: N/A  . Number of children: 1  . Years of education: N/A   Occupational History  . Social worker Unemployed   Social History Main Topics  . Smoking status: Former Smoker    Types: Cigarettes    Quit date: 09/11/2001  . Smokeless tobacco: Never Used  . Alcohol use No  . Drug use: No  . Sexual activity: Not on file   Other Topics Concern  . Not on file   Social History Narrative  . No narrative on file    Family History  Problem Relation Age of Onset  . Lung cancer Mother  died at an early age  . Cancer Mother     lung  . Other      There is no Hx of premature coronary artery disease in her family    ROS: no fevers or chills, productive cough, hemoptysis, dysphasia, odynophagia, melena, hematochezia, dysuria, hematuria, rash, seizure activity, orthopnea, PND, pedal edema, claudication. Remaining systems are negative.  Physical Exam: Well-developed well-nourished in no acute distress.  Skin is warm and dry.  HEENT is normal.  Neck is supple. Left bruit Chest is clear to auscultation with normal expansion.  Cardiovascular exam is regular rate and rhythm.  Abdominal exam nontender or distended. No  masses palpated. Extremities show no edema. neuro grossly intact  ECG-sinus rhythm at a rate of 61. Lateral T-wave inversion.  A/P  1 coronary artery disease-continue aspirin and statin. No symptoms.  2 carotid artery disease-continue aspirin and statin. Follow-up carotid Dopplers November 2018.  3 Hypertension-blood pressure borderline. She has not been taking her amlodipine. We will resume at 5 mg daily and follow her pressure.  4 hyperlipidemia-continue statin.  Kirk Ruths, MD

## 2016-09-19 ENCOUNTER — Encounter: Payer: Self-pay | Admitting: Cardiology

## 2016-09-19 ENCOUNTER — Ambulatory Visit (INDEPENDENT_AMBULATORY_CARE_PROVIDER_SITE_OTHER): Payer: Medicare Other | Admitting: Cardiology

## 2016-09-19 DIAGNOSIS — E7800 Pure hypercholesterolemia, unspecified: Secondary | ICD-10-CM

## 2016-09-19 DIAGNOSIS — R0602 Shortness of breath: Secondary | ICD-10-CM | POA: Diagnosis not present

## 2016-09-19 DIAGNOSIS — I2510 Atherosclerotic heart disease of native coronary artery without angina pectoris: Secondary | ICD-10-CM

## 2016-09-19 DIAGNOSIS — I10 Essential (primary) hypertension: Secondary | ICD-10-CM

## 2016-09-19 MED ORDER — AMLODIPINE BESYLATE 5 MG PO TABS: 5.0000 mg | ORAL_TABLET | Freq: Every day | ORAL | 3 refills | Status: DC

## 2016-09-19 NOTE — Patient Instructions (Signed)
Medication Instructions:   START AMLODIPINE 5 MG ONCE DAILY  Follow-Up:  Your physician wants you to follow-up in: ONE YEAR WITH DR CRENSHAW  You will receive a reminder letter in the mail two months in advance. If you don't receive a letter, please call our office to schedule the follow-up appointment.   If you need a refill on your cardiac medications before your next appointment, please call your pharmacy.    

## 2016-10-10 ENCOUNTER — Telehealth: Payer: Self-pay | Admitting: Cardiology

## 2016-10-10 NOTE — Telephone Encounter (Signed)
Follow BP on meds and then let us know fu BP Kirk Ruths

## 2016-10-10 NOTE — Telephone Encounter (Signed)
New message      Talk to the the nurse regarding her bp

## 2016-10-10 NOTE — Telephone Encounter (Signed)
Spoke with pt, she reports bp averaging 187/70 since Friday. She did miss 3 days of amlodipine and restarted that yesterday. She denies chest pain, SOB or other issues. She goes to the gym daily. She is also wanting to get a nutritional referral to help her with her salt intake and making better choices. She is concerned about her bp and wanted to make dr Stanford Breed aware. Will forward for dr Stanford Breed review

## 2016-10-10 NOTE — Telephone Encounter (Signed)
Spoke with pt, Aware of dr crenshaw's recommendations.  °

## 2016-10-13 ENCOUNTER — Ambulatory Visit (INDEPENDENT_AMBULATORY_CARE_PROVIDER_SITE_OTHER): Payer: Medicare Other | Admitting: Podiatry

## 2016-10-13 ENCOUNTER — Ambulatory Visit (INDEPENDENT_AMBULATORY_CARE_PROVIDER_SITE_OTHER): Payer: Medicare Other

## 2016-10-13 DIAGNOSIS — M2010 Hallux valgus (acquired), unspecified foot: Secondary | ICD-10-CM

## 2016-10-13 DIAGNOSIS — M2040 Other hammer toe(s) (acquired), unspecified foot: Secondary | ICD-10-CM | POA: Diagnosis not present

## 2016-10-13 DIAGNOSIS — L03031 Cellulitis of right toe: Secondary | ICD-10-CM

## 2016-10-13 NOTE — Patient Instructions (Signed)
Bunionectomy A bunionectomy is a surgical procedure to remove a bunion. A bunion is a visible bump of bone on the inside of your foot where your big toe meets the rest of your foot. A bunion can develop when pressure turns this bone (first metatarsal) toward the other toes. Shoes that are too tight are the most common cause of bunions. Bunions can also be caused by diseases, such as arthritis and polio. You may need a bunionectomy if your bunion is very large and painful or it affects your ability to walk. Tell a health care provider about:  Any allergies you have.  All medicines you are taking, including vitamins, herbs, eye drops, creams, and over-the-counter medicines.  Any problems you or family members have had with anesthetic medicines.  Any blood disorders you have.  Any surgeries you have had.  Any medical conditions you have. What are the risks? Generally, this is a safe procedure. However, problems may occur, including:  Infection.  Pain.  Nerve damage.  Bleeding or blood clots.  Reactions to medicines.  Numbness, stiffness, or arthritis in your toe.  Foot problems that continue even after the procedure. What happens before the procedure?  Ask your health care provider about:  Changing or stopping your regular medicines. This is especially important if you are taking diabetes medicines or blood thinners.  Taking medicines such as aspirin and ibuprofen. These medicines can thin your blood. Do not take these medicines before your procedure if your health care provider instructs you not to.  Do not drink alcohol before the procedure as directed by your health care provider.  Do not use tobacco products, including cigarettes, chewing tobacco, or electronic cigarettes, before the procedure as directed by your health care provider. If you need help quitting, ask your health care provider.  Ask your health care provider what kind of medicine you will be given during  your procedure. A bunionectomy may be done using one of these:  A medicine that numbs the area (local anesthetic).  A medicine that makes you go to sleep (general anesthetic). If you will be given general anesthetic, do not eat or drink anything after midnight on the night before the procedure or as directed by your health care provider. What happens during the procedure?  An IV tube may be inserted into a vein.  You will be given local anesthetic or general anesthetic.  The surgeon will make a cut (incision) over the enlarged area at the first joint of the big toe. The surgeon will remove the bunion.  You may have more than one incision if any of the bones in your big toe need to be moved. A bone itself may need to be cut.  Sometimes the tissues around the big toe may also need to be cut then tightened or loosened to reposition the toe.  Screws or other hardware may be used to keep your foot in thecorrect position.  The incision will be closed with stitches (sutures) and covered with adhesive strips or another type of bandage (dressing). What happens after the procedure?  You may spend some time in a recovery area.  Your blood pressure, heart rate, breathing rate, and blood oxygen level will be monitored often until the medicines you were given have worn off. This information is not intended to replace advice given to you by your health care provider. Make sure you discuss any questions you have with your health care provider. Document Released: 08/11/2005 Document Revised: 02/03/2016 Document Reviewed: 04/15/2014   [employer] Interactive Patient Education  2017 [employer]  

## 2016-10-14 NOTE — Progress Notes (Signed)
Subjective:     Patient ID: Brenda [name], female   DOB: 05/25/1956, 61 y.o.   MRN: [id number]  HPI patient states she traumatized her right big toenail and has structural deformity of both feet and states the nail has been draining on both sides and is painful   Review of Systems  All other systems reviewed and are negative.      Objective:   Physical Exam  Constitutional: She is oriented to person, place, and time.  Cardiovascular: Intact distal pulses.   Musculoskeletal: Normal range of motion.  Neurological: She is oriented to person, place, and time.  Skin: Skin is warm.  Nursing note and vitals reviewed.  neurovascular status intact muscle strength adequate range of motion within normal limits with patient found to have a traumatized right hallux nature with abscess on both medial lateral side with no proximal edema erythema drainage noted currently. Patient's also noted to have large structural bunion deformity bilateral that are mildly painful but she's able to accommodate with shoe gear     Assessment:     Damage right hallux nail with abscess occurring both medial lateral side with also structural bunion deformity bilateral    Plan:     H&P and all conditions reviewed. I do think this nail needs removed and abscess tissue excised and patient wants procedure understanding the nail may grow back abnormally. Today I infiltrated 60 minutes I can Marcaine mixture remove the nail and abscess tissue proximal clean and flushed the bed and applied sterile dressing. Patient will begin soaks and this should heal uneventfully but will be seen back if any issues were to occur and discussed bunion deformity and the possibility for correction long-term  X-ray report indicated that there is structural bunion deformity bilateral with elevation of the intermetatarsal angle

## 2016-10-18 ENCOUNTER — Telehealth: Payer: Self-pay | Admitting: *Deleted

## 2016-10-18 NOTE — Telephone Encounter (Signed)
Pt states she is still having pain in the toe, a hole where the nail was removed and bleeding. I told pt that she would have oozing and pain for up to 4-6 weeks and she should continue the soaks 2 times a day and cover with the neosporin bandaid after each soak. Pt states she is allowing the toe to air dry in the evening when she rest. I told her that we needed the area to heal from the inside out and the soaks would allow the area to heal from the inside out rather than closing in drainage or infection. I suggested epsom salt soaks and pt states those were not the instructions she got and she was told to allow to air dry as much as possible. I tried to tell pt that she could use either soak, but I had found the epsom salt soak to be more soothing and quicker to get to where there was little or no drainage which was what we wanted the area, not to have redness, swelling, and or drainage. Pt states this was not right for her not to get the proper instructions and she had been just left in the room and was given instructions for surgery and then told those were not for her, and she felt she was treated shoty and just swept away like we were done with her. I told pt I was sorry for the misunderstanding and would have my supervisor call.

## 2016-10-19 ENCOUNTER — Telehealth: Payer: Self-pay | Admitting: Podiatry

## 2016-10-23 ENCOUNTER — Telehealth: Payer: Self-pay | Admitting: Pharmacist

## 2016-10-23 ENCOUNTER — Telehealth: Payer: Self-pay | Admitting: Cardiology

## 2016-10-23 DIAGNOSIS — E7800 Pure hypercholesterolemia, unspecified: Secondary | ICD-10-CM

## 2016-10-23 NOTE — Telephone Encounter (Signed)
-----   Message from Lelon Perla, MD sent at 10/17/2016  4:18 PM EST ----- Regarding: RE: Orion 10 Trial  Sounds good; Can you call her and see if she is interested? Thx BC ----- Message ----- From: Tempie Donning, RN Sent: 10/17/2016   2:07 PM To: Jackqulyn Livings, RN, Lelon Perla, MD, # Subject: Orion 10 Trial                                 Dr. Stanford Breed,   I am writing to inquire if you feel that Ms. Schuneman would be a good candidate for the Orion 10 lipid trial. She is currently on Crestor 40 mg and her last LDL was 86.Based on her statin dosage and current lipid numbers she would be an ideal patient. Please let us know your thoughts.  Thanks  Ambrose Pancoast RN

## 2016-10-23 NOTE — Telephone Encounter (Signed)
LMTCB No mention in last OV of referral  No pending referrals  Routed to [place], [place]

## 2016-10-23 NOTE — Telephone Encounter (Signed)
Left message for patient, referral placed and if she does not hear to let me know.

## 2016-10-23 NOTE — Telephone Encounter (Signed)
Patient NOT interested in any studies.  Her BP still elevated and not interested medication changes until Endocrinologist and PCP adjust mediations.

## 2016-10-23 NOTE — Telephone Encounter (Signed)
Nee message  Pt call requesting to speak with RN about a referral to a Nutritionist. Please call back to discuss

## 2016-10-24 ENCOUNTER — Encounter: Payer: Self-pay | Admitting: Pharmacist Clinician (PhC)/ Clinical Pharmacy Specialist

## 2016-10-24 ENCOUNTER — Ambulatory Visit (INDEPENDENT_AMBULATORY_CARE_PROVIDER_SITE_OTHER): Payer: Medicare Other | Admitting: Pharmacist Clinician (PhC)/ Clinical Pharmacy Specialist

## 2016-10-24 DIAGNOSIS — I10 Essential (primary) hypertension: Secondary | ICD-10-CM

## 2016-10-24 MED ORDER — AMLODIPINE BESYLATE 10 MG PO TABS: 10.0000 mg | ORAL_TABLET | Freq: Every day | ORAL | 5 refills | Status: DC

## 2016-10-24 NOTE — Progress Notes (Signed)
10/24/2016 Brenda [name] [birth year] [id number]   HPI:  Brenda [name] is a 61 y.o. female patient of Dr Stanford Breed, with a PMH below who presents today for hypertension clinic evaluation.  She was originally contacted about enrolling in [place], but with her BP running so high lately, she was more interested in getting hypertension under control.   Her history is significant in that she had breast cancer, treated with radiation and chemotherapy in 1999.  She had CABG x 5 as well as a carotid endarterectomy in 2001 and hypertension since about the same time.  She has been on the same medications for hypertension for most of that time, and with the recent purchase of a home BP cuff, has noted that readings are consistently high.    Blood Pressure Goal:  130/80   Current Medications:  Metoprolol succ 100 mg qd  Lisinopril/hctz 20/25 mg qd  Amlodipine 5 mg qd  Family Hx:  Mother died from cancer when patient was 14, lived in foster care and with older aunts/uncles.  Family history unknown.  Social Hx:  Quit smoking and alcohol about 20 years ago; drinks 1 cup of coffee most days  Diet:  Eats mostly healthy, home cooked foods; enjoys vegetables and salmon, admits to occasionally "cheating" with chips; some added salt  Exercise:  Several days per week, enjoys yoga, treadmill, cycle   Home BP readings:  Of 10 last readings on home cuff, average 189/76.  Range 0000000 systolic, only 3 diastolic readings [id number]. Intolerances:   none  Wt Readings from Last 3 Encounters:  09/19/16 166 lb (75.3 kg)  02/25/16 166 lb (75.3 kg)  02/04/16 167 lb 9.6 oz (76 kg)   BP Readings from Last 3 Encounters:  10/24/16 (!) 190/72  09/19/16 138/90  09/12/16 (!) 234/79   Pulse Readings from Last 3 Encounters:  10/24/16 60  09/19/16 61  09/12/16 64    Current Outpatient Prescriptions  Medication Sig Dispense Refill  . amLODipine (NORVASC) 10 MG tablet Take 1 tablet (10 mg total) by mouth daily. 30  tablet 5  . aspirin 81 MG tablet Take 81 mg by mouth daily.      . Cholecalciferol (VITAMIN D) 1000 UNITS capsule Take 2,000 Units by mouth daily.     Marland Kitchen lisinopril-hydrochlorothiazide (PRINZIDE,ZESTORETIC) 20-25 MG tablet Take 1 tablet by mouth daily. 30 tablet 1  . metoprolol succinate (TOPROL-XL) 100 MG 24 hr tablet Take 1 tablet (100 mg total) by mouth daily. Take with or immediately following a meal. 30 tablet 1  . Multiple Vitamin (MULTIVITAMIN) tablet Take 1 tablet by mouth daily.      . nitroGLYCERIN (NITROSTAT) 0.4 MG SL tablet Place 1 tablet (0.4 mg total) under the tongue every 5 (five) minutes as needed for chest pain (x 3 tabs daily). 20 tablet 1  . potassium chloride (K-DUR) 10 MEQ tablet TAKE ONE TABLET BY MOUTH ONCE DAILY 90 tablet 0  . rosuvastatin (CRESTOR) 40 MG tablet Take 1 tablet (40 mg total) by mouth daily. 90 tablet 3   No current facility-administered medications for this visit.     Allergies  Allergen Reactions  . Latex     unknown    Past Medical History:  Diagnosis Date  . Cancer [facility])    breast cancer  . Carotid artery stenosis   . Coronary artery disease   . Depression   . Hypercholesteremia   . Hypertension   . Hyperthyroidism   . Stroke [facility])  Blood pressure (!) 190/72, pulse 60.  Essential hypertension Patient with uncontrolled hypertension, currently on 4 medications.  She takes all at same time each day.  She will increase her amlodipine to 10 mg daily and move the amlodipine and metoprolol to evenings.  She is to continue with lisinopril hydrocholorothiazide 20/25 mg each morning.  I have asked that she continue with daily home BP checks and reviewed technique with her.  Will have her return in 3 weeks for follow up.       Tommy Medal PharmD CPP [facility]

## 2016-10-24 NOTE — Assessment & Plan Note (Signed)
Patient with uncontrolled hypertension, currently on 4 medications.  She takes all at same time each day.  She will increase her amlodipine to 10 mg daily and move the amlodipine and metoprolol to evenings.  She is to continue with lisinopril hydrocholorothiazide 20/25 mg each morning.  I have asked that she continue with daily home BP checks and reviewed technique with her.  Will have her return in 3 weeks for follow up.

## 2016-10-24 NOTE — Patient Instructions (Signed)
Return for a a follow up appointment in 3 weeks  Take another 5 mg of amlodipine tonight.  Your blood pressure today is 190/72 (goal is < 130/80)  Check your blood pressure at home daily and keep record of the readings.  Take your BP meds as follows:  AM:  Lisinopril/hctz 20/25 mg   PM: amlodipine 10 mg (take 2 of the 5 mg tabs until they are gone)         Metoprolol succ 100 mg  Bring your record of home blood pressures to your next appointment.  Exercise as you're able, try to walk approximately 30 minutes per day.  Keep salt intake to a minimum, especially watch canned and prepared boxed foods.  Eat more fresh fruits and vegetables and fewer canned items.  Avoid eating in fast food restaurants.    HOW TO TAKE YOUR BLOOD PRESSURE: . Rest 5 minutes before taking your blood pressure. .  Don't smoke or drink caffeinated beverages for at least 30 minutes before. . Take your blood pressure before (not after) you eat. . Sit comfortably with your back supported and both feet on the floor (don't cross your legs). . Elevate your arm to heart level on a table or a desk. . Use the proper sized cuff. It should fit smoothly and snugly around your bare upper arm. There should be enough room to slip a fingertip under the cuff. The bottom edge of the cuff should be 1 inch above the crease of the elbow. . Ideally, take 3 measurements at one sitting and record the average.

## 2016-11-05 ENCOUNTER — Other Ambulatory Visit: Payer: Self-pay | Admitting: Cardiology

## 2016-11-05 DIAGNOSIS — R0602 Shortness of breath: Secondary | ICD-10-CM

## 2016-11-05 DIAGNOSIS — I10 Essential (primary) hypertension: Secondary | ICD-10-CM

## 2016-11-05 DIAGNOSIS — E7800 Pure hypercholesterolemia, unspecified: Secondary | ICD-10-CM

## 2016-11-05 DIAGNOSIS — I2510 Atherosclerotic heart disease of native coronary artery without angina pectoris: Secondary | ICD-10-CM

## 2016-11-06 NOTE — Telephone Encounter (Signed)
Rx(s) sent to pharmacy electronically.  

## 2016-11-07 ENCOUNTER — Other Ambulatory Visit: Payer: Self-pay | Admitting: Cardiology

## 2016-11-07 DIAGNOSIS — I10 Essential (primary) hypertension: Secondary | ICD-10-CM

## 2016-11-07 DIAGNOSIS — E7800 Pure hypercholesterolemia, unspecified: Secondary | ICD-10-CM

## 2016-11-07 DIAGNOSIS — I2510 Atherosclerotic heart disease of native coronary artery without angina pectoris: Secondary | ICD-10-CM

## 2016-11-07 DIAGNOSIS — R0602 Shortness of breath: Secondary | ICD-10-CM

## 2016-11-13 NOTE — Progress Notes (Signed)
Patient ID: Brenda [name]                 DOB: April 24, 1956                      MRN: [id number]     HPI:  Brenda [name] is a 61 y.o. female patient of Dr Stanford Breed, with a PMH below who presents today for hypertension clinic. PMH significant for breast cancer, CABG x 5, carotid endarterectomy in 2001 and hypertension. Amlodipine 5mg  dose was increased to 10mg  daily during last office visit.  Patient presents today for hypertension follow up. Denies dizziness, fatigue, swelling or frequent headaches.  Current HTN meds:  Lisinopril/hctz 20/25 mg every morning amlodipine 10 mg every evening Metoprolol succ 100 mg every morning  BP goal: <130/80  Family History:  Mother died from cancer when patient was 11, lived in foster care and with older aunts/uncles.  Family history unknown.  Social History: Quit smoking and alcohol about 20 years ago; drinks 1 cup of coffee most days  Diet: Eats mostly healthy, home cooked foods; enjoys vegetables and salmon, admits to occasionally "cheating" with chips; some added salt  Exercise: Several days per week, enjoys yoga, treadmill, cycle   Home BP readings: 10 readings ; 175/75 average  Wt Readings from Last 3 Encounters:  09/19/16 166 lb (75.3 kg)  02/25/16 166 lb (75.3 kg)  02/04/16 167 lb 9.6 oz (76 kg)   BP Readings from Last 3 Encounters:  11/14/16 (!) 182/84  10/24/16 (!) 190/72  09/19/16 138/90   Pulse Readings from Last 3 Encounters:  10/24/16 60  09/19/16 61  09/12/16 64     Past Medical History:  Diagnosis Date  . Cancer [facility])    breast cancer  . Carotid artery stenosis   . Coronary artery disease   . Depression   . Hypercholesteremia   . Hypertension   . Hyperthyroidism   . Stroke [facility])     Current Outpatient Prescriptions on File Prior to Visit  Medication Sig Dispense Refill  . amLODipine (NORVASC) 10 MG tablet Take 1 tablet (10 mg total) by mouth daily. 30 tablet 5  . aspirin 81 MG tablet Take 81 mg by mouth daily.        . Cholecalciferol (VITAMIN D) 1000 UNITS capsule Take 2,000 Units by mouth daily.     . metoprolol succinate (TOPROL-XL) 100 MG 24 hr tablet TAKE ONE TABLET BY MOUTH ONCE DAILY WITH OR IMMEDIATELY FOLLOWING A MEAL. 30 tablet 1  . Multiple Vitamin (MULTIVITAMIN) tablet Take 1 tablet by mouth daily.      . nitroGLYCERIN (NITROSTAT) 0.4 MG SL tablet Place 1 tablet (0.4 mg total) under the tongue every 5 (five) minutes as needed for chest pain (x 3 tabs daily). 20 tablet 1  . potassium chloride (K-DUR) 10 MEQ tablet TAKE ONE TABLET BY MOUTH ONCE DAILY 90 tablet 0  . rosuvastatin (CRESTOR) 40 MG tablet Take 1 tablet (40 mg total) by mouth daily. 90 tablet 3   No current facility-administered medications on file prior to visit.     Allergies  Allergen Reactions  . Latex     unknown    Blood pressure (!) 182/84.  Essential hypertension:  BP remains above goal at home and during office visit. Patient experiencing some lower back pain but denies any other changes in health or hyper/hypo tension symptoms.  Will increase Zestoretic to 40/50mg  daily and repeat BMET in 2 weeks.  Follow up scheduled  with HTN clinic in 3 weeks and patient was instructed to contact clinic fi low blood pressure noted or symptoms of fatigue or dizziness appear.   Raquel Rodriguez-Guzman PharmD, [place] [address] 11/14/2016 1:41 PM

## 2016-11-14 ENCOUNTER — Ambulatory Visit (INDEPENDENT_AMBULATORY_CARE_PROVIDER_SITE_OTHER): Payer: Medicare Other | Admitting: Pharmacist

## 2016-11-14 VITALS — BP 182/84

## 2016-11-14 DIAGNOSIS — I10 Essential (primary) hypertension: Secondary | ICD-10-CM

## 2016-11-14 DIAGNOSIS — E7800 Pure hypercholesterolemia, unspecified: Secondary | ICD-10-CM

## 2016-11-14 DIAGNOSIS — I2510 Atherosclerotic heart disease of native coronary artery without angina pectoris: Secondary | ICD-10-CM | POA: Diagnosis not present

## 2016-11-14 DIAGNOSIS — R0602 Shortness of breath: Secondary | ICD-10-CM | POA: Diagnosis not present

## 2016-11-14 MED ORDER — LISINOPRIL-HYDROCHLOROTHIAZIDE 20-25 MG PO TABS: 2.0000 | ORAL_TABLET | Freq: Every day | ORAL | 1 refills | Status: DC

## 2016-11-14 NOTE — Patient Instructions (Addendum)
Return for a  follow up appointment in 3 weeks  Your blood pressure today is 182/84   Check your blood pressure at home daily (if able) and keep record of the readings.  Take your BP meds as follows: **Increase Lisinopril/hctz to 40/50 mg every morning** amlodipine 10 mg every evening Metoprolol succ 100 mg every morning  **Blood work 2 weeks after initiating new lisinopril dose**  Bring all of your meds, your BP cuff and your record of home blood pressures to your next appointment.  Exercise as you're able, try to walk approximately 30 minutes per day.  Keep salt intake to a minimum, especially watch canned and prepared boxed foods.  Eat more fresh fruits and vegetables and fewer canned items.  Avoid eating in fast food restaurants.    HOW TO TAKE YOUR BLOOD PRESSURE: . Rest 5 minutes before taking your blood pressure. .  Don't smoke or drink caffeinated beverages for at least 30 minutes before. . Take your blood pressure before (not after) you eat. . Sit comfortably with your back supported and both feet on the floor (don't cross your legs). . Elevate your arm to heart level on a table or a desk. . Use the proper sized cuff. It should fit smoothly and snugly around your bare upper arm. There should be enough room to slip a fingertip under the cuff. The bottom edge of the cuff should be 1 inch above the crease of the elbow. . Ideally, take 3 measurements at one sitting and record the average.

## 2016-11-19 ENCOUNTER — Ambulatory Visit (HOSPITAL_COMMUNITY)
Admission: EM | Admit: 2016-11-19 | Discharge: 2016-11-19 | Disposition: A | Discharge status: Home / Self Care | Payer: Medicare Other | Attending: Family Medicine | Admitting: Family Medicine

## 2016-11-19 ENCOUNTER — Encounter (HOSPITAL_COMMUNITY): Payer: Self-pay | Admitting: *Deleted

## 2016-11-19 DIAGNOSIS — M25511 Pain in right shoulder: Secondary | ICD-10-CM

## 2016-11-19 DIAGNOSIS — M75101 Unspecified rotator cuff tear or rupture of right shoulder, not specified as traumatic: Secondary | ICD-10-CM

## 2016-11-19 DIAGNOSIS — M7581 Other shoulder lesions, right shoulder: Secondary | ICD-10-CM

## 2016-11-19 DIAGNOSIS — M7551 Bursitis of right shoulder: Secondary | ICD-10-CM

## 2016-11-19 HISTORY — DX: Heart failure, unspecified: I50.9

## 2016-11-19 MED ORDER — NAPROXEN 500 MG PO TABS: 500.0000 mg | ORAL_TABLET | Freq: Two times a day (BID) | ORAL | 0 refills | Status: DC

## 2016-11-19 MED ORDER — KETOROLAC TROMETHAMINE 60 MG/2ML IM SOLN
60.0000 mg | Freq: Once | INTRAMUSCULAR | Status: AC
  Administered 2016-11-19: 60 mg via INTRAMUSCULAR

## 2016-11-19 MED ORDER — KETOROLAC TROMETHAMINE 60 MG/2ML IM SOLN
INTRAMUSCULAR | Status: AC
  Filled 2016-11-19: qty 2

## 2016-11-19 NOTE — ED Triage Notes (Signed)
C/O right shoulder pain radiating across right upper chest.  States has been having shoulder pain; pt lifts weights and does yoga.  Has tried massage without relief.  C/O very painful movement of neck, and RUE.

## 2016-11-19 NOTE — ED Provider Notes (Signed)
CSN: [id number]     Arrival date & time 11/19/16  1428 History   First MD Initiated Contact with Patient 11/19/16 1513     Chief Complaint  Patient presents with  . Shoulder Pain   (Consider location/radiation/quality/duration/timing/severity/associated sxs/prior Treatment) Patient was lifting weights and doing a lot of repetitive movements with her right upper extremity and chest and now is c/o right arm and shoulder discomfort and right pectoralis discomfort.   The history is provided by the patient.  Shoulder Pain  Location:  Shoulder Shoulder location:  R shoulder Injury: no   Pain details:    Quality:  Aching   Radiates to:  R shoulder   Severity:  Moderate   Onset quality:  Sudden   Duration:  2 days   Timing:  Constant Handedness:  Right-handed Dislocation: no   Foreign body present:  No foreign bodies Worsened by:  Nothing Ineffective treatments:  None tried   Past Medical History:  Diagnosis Date  . Cancer [facility])    breast cancer  . Carotid artery stenosis   . CHF (congestive heart failure) ([place])   . Coronary artery disease   . Depression   . Hypercholesteremia   . Hypertension   . Hyperthyroidism   . Stroke [facility])    Past Surgical History:  Procedure Laterality Date  . ABDOMINAL HYSTERECTOMY    . BREAST LUMPECTOMY  1999  . BREAST LUMPECTOMY     left breast  . CARDIAC SURGERY    . CAROTID ENDARTERECTOMY  04-2000   left  . CORONARY ARTERY BYPASS GRAFT  2001   x 5 (left internal mammary artery to left anterior  descending coronary artery   Family History  Problem Relation Age of Onset  . Lung cancer Mother     died at an early age  . Cancer Mother     lung  . Other      There is no Hx of premature coronary artery disease in her family   Social History  Substance Use Topics  . Smoking status: Former Smoker    Types: Cigarettes    Quit date: 09/11/2001  . Smokeless tobacco: Never Used  . Alcohol use No   OB History    No data available      Review of Systems  Constitutional: Negative.   HENT: Negative.   Eyes: Negative.   Respiratory: Negative.   Cardiovascular: Negative.   Gastrointestinal: Negative.   Endocrine: Negative.   Genitourinary: Negative.   Musculoskeletal: Positive for arthralgias and myalgias.  Allergic/Immunologic: Negative.   Neurological: Negative.   Hematological: Negative.   Psychiatric/Behavioral: Negative.     Allergies  Latex  Home Medications   Prior to Admission medications   Medication Sig Start Date End Date Taking? Authorizing Provider  amLODipine (NORVASC) 10 MG tablet Take 1 tablet (10 mg total) by mouth daily. 10/24/16 01/22/17 Yes Lelon Perla, MD  aspirin 81 MG tablet Take 81 mg by mouth daily.     Yes Historical Provider, MD  Cholecalciferol (VITAMIN D) 1000 UNITS capsule Take 2,000 Units by mouth daily.    Yes Historical Provider, MD  lisinopril-hydrochlorothiazide (PRINZIDE,ZESTORETIC) 20-25 MG tablet Take 2 tablets by mouth daily. 11/14/16  Yes Lelon Perla, MD  metoprolol succinate (TOPROL-XL) 100 MG 24 hr tablet TAKE ONE TABLET BY MOUTH ONCE DAILY WITH OR IMMEDIATELY FOLLOWING A MEAL. 11/07/16  Yes Lelon Perla, MD  Multiple Vitamin (MULTIVITAMIN) tablet Take 1 tablet by mouth daily.     Yes  Historical Provider, MD  potassium chloride (K-DUR) 10 MEQ tablet TAKE ONE TABLET BY MOUTH ONCE DAILY 09/12/16  Yes Lelon Perla, MD  rosuvastatin (CRESTOR) 40 MG tablet Take 1 tablet (40 mg total) by mouth daily. 06/18/14  Yes Lelon Perla, MD  naproxen (NAPROSYN) 500 MG tablet Take 1 tablet (500 mg total) by mouth 2 (two) times daily with a meal. 11/19/16   Lysbeth Penner, FNP  nitroGLYCERIN (NITROSTAT) 0.4 MG SL tablet Place 1 tablet (0.4 mg total) under the tongue every 5 (five) minutes as needed for chest pain (x 3 tabs daily). 08/20/15   [name], MD   Meds Ordered and Administered this Visit   Medications  ketorolac (TORADOL) injection 60 mg (60 mg  Intramuscular Given 11/19/16 1541)    BP 172/71   Pulse 75   Temp 99.2 F (37.3 C) (Oral)   Resp 14   SpO2 100%  No data found.   Physical Exam  Constitutional: She appears well-developed and well-nourished.  HENT:  Head: Normocephalic and atraumatic.  Eyes: Conjunctivae and EOM are normal. Pupils are equal, round, and reactive to light.  Neck: Normal range of motion. Neck supple.  Cardiovascular: Normal rate, regular rhythm and normal heart sounds.   Pulmonary/Chest: Effort normal and breath sounds normal.  Musculoskeletal: She exhibits tenderness.  TTP right shoulder with internal and external rotation and positive Neer and Hawkins.  Negative biceps tendon tenderness. Tenderness to right pectoralis.  Nursing note and vitals reviewed.   Urgent Care Course     Procedures (including critical care time)  Labs Review Labs Reviewed - No data to display  Imaging Review No results found.   Visual Acuity Review  Right Eye Distance:   Left Eye Distance:   Bilateral Distance:    Right Eye Near:   Left Eye Near:    Bilateral Near:         MDM   1. Acute pain of right shoulder   2. Right rotator cuff tendonitis   3. Bursitis of right shoulder    Naprosyn 500mg  one po bid x 10 days Toradol 60mg  IM      Lysbeth Penner, FNP 11/19/16 1622

## 2016-11-21 ENCOUNTER — Other Ambulatory Visit: Payer: Self-pay

## 2016-11-21 MED ORDER — LISINOPRIL-HYDROCHLOROTHIAZIDE 20-25 MG PO TABS: 2.0000 | ORAL_TABLET | Freq: Every day | ORAL | 1 refills | Status: DC

## 2016-11-28 ENCOUNTER — Other Ambulatory Visit: Payer: Self-pay

## 2016-11-28 DIAGNOSIS — I10 Essential (primary) hypertension: Secondary | ICD-10-CM

## 2016-11-28 LAB — BASIC METABOLIC PANEL
BUN: 16 mg/dL (ref 7–25)
CO2: 29 mmol/L (ref 20–31)
Calcium: 10.1 mg/dL (ref 8.6–10.4)
Chloride: 101 mmol/L (ref 98–110)
Creat: 0.95 mg/dL (ref 0.50–0.99)
Glucose, Bld: 90 mg/dL (ref 65–99)
Potassium: 4.5 mmol/L (ref 3.5–5.3)
Sodium: 140 mmol/L (ref 135–146)

## 2016-12-06 NOTE — Progress Notes (Signed)
Patient ID: Brenda [name]                 DOB: Jan 19, 1956                      MRN: [id number]     HPI:  Brenda [name] is a 61 y.o. female patient of Dr Stanford Breed, with a PMH below who presents today for hypertension clinic. PMH significant for breast cancer, CABG x 5, carotid endarterectomy in 2001 and hypertension. Lisinopril/HCTZ dose was increased to 40/50mg  during last office visit.  BMET repeated 2 weeks after medication changed show SCR = 0.95 and all electrolytes within normal limits.  Patient presents today for hypertension follow up.   Denies dizziness, fatigue, swelling or frequent headaches.  Only complains today is pain in her shoulder and leg. She understand her pain is related to arthritis and using topical medication only.  Current HTN meds:  Lisinopril/hctz 40/50 mg every morning amlodipine 10 mg every evening Metoprolol succ 100 mg every morning  BP goal: <130/80  Family History: Mother died from cancer when patient was 55, lived in foster care and with older aunts/uncles. Family history unknown.  Social History: Quit smoking and alcohol about 20 years ago; drinks 1 cup of coffee most days  Diet: Eats mostly healthy, home cooked foods; enjoys vegetables and salmon, admits to occasionally "cheating" with chips; some added salt  Exercise: Several days per week, enjoys yoga, treadmill, cycle   Home BP readings: 12 readings (all morning prior to taking medication); 171/74 average (range 156-185/59-79)  Wt Readings from Last 3 Encounters:  09/19/16 166 lb (75.3 kg)  02/25/16 166 lb (75.3 kg)  02/04/16 167 lb 9.6 oz (76 kg)   BP Readings from Last 3 Encounters:  12/07/16 (!) 158/72  11/19/16 172/71  11/14/16 (!) 182/84   Pulse Readings from Last 3 Encounters:  12/07/16 66  11/19/16 75  10/24/16 60    Past Medical History:  Diagnosis Date  . Cancer [facility])    breast cancer  . Carotid artery stenosis   . CHF (congestive heart failure) ([place])   . Coronary  artery disease   . Depression   . Hypercholesteremia   . Hypertension   . Hyperthyroidism   . Stroke [facility])     Current Outpatient Prescriptions on File Prior to Visit  Medication Sig Dispense Refill  . amLODipine (NORVASC) 10 MG tablet Take 1 tablet (10 mg total) by mouth daily. 30 tablet 5  . aspirin 81 MG tablet Take 81 mg by mouth daily.      . Cholecalciferol (VITAMIN D) 1000 UNITS capsule Take 2,000 Units by mouth daily.     Marland Kitchen lisinopril-hydrochlorothiazide (PRINZIDE,ZESTORETIC) 20-25 MG tablet Take 2 tablets by mouth daily. 180 tablet 1  . metoprolol succinate (TOPROL-XL) 100 MG 24 hr tablet TAKE ONE TABLET BY MOUTH ONCE DAILY WITH OR IMMEDIATELY FOLLOWING A MEAL. 30 tablet 1  . Multiple Vitamin (MULTIVITAMIN) tablet Take 1 tablet by mouth daily.      . naproxen (NAPROSYN) 500 MG tablet Take 1 tablet (500 mg total) by mouth 2 (two) times daily with a meal. 20 tablet 0  . nitroGLYCERIN (NITROSTAT) 0.4 MG SL tablet Place 1 tablet (0.4 mg total) under the tongue every 5 (five) minutes as needed for chest pain (x 3 tabs daily). 20 tablet 1  . potassium chloride (K-DUR) 10 MEQ tablet TAKE ONE TABLET BY MOUTH ONCE DAILY 90 tablet 0  . rosuvastatin (CRESTOR) 40 MG tablet  Take 1 tablet (40 mg total) by mouth daily. 90 tablet 3   No current facility-administered medications on file prior to visit.     Allergies  Allergen Reactions  . Latex     unknown    Blood pressure (!) 158/72, pulse 66, SpO2 98 %.  Essential hypertension:  Blood pressure today significantly improved from previous readings but remains above goal of 130/80. Patient denies any symptoms or ADRs to current therapy. Most recent BMET shows stable renal function and electrolytes as well.   Amlodipine and Zestoretic are at maximum daily dose already, metoprolol at maximum tolerated dose with HR ranging from 60-72 bpm.   Will add spironolactone 25mg  daily to therapy, discontinue potassium chloride, move metoprolol dose to  every evening, and repeat BMET in 2 weeks.  Patient encouraged to stay hydrated, keep BP log, and call clinic if experiencing any problems or ADRs.  Next follow up in 4 weeks.  Raquel Rodriguez-Guzman PharmD, [place] [id number] 12/07/2016 8:24 AM

## 2016-12-07 ENCOUNTER — Ambulatory Visit (INDEPENDENT_AMBULATORY_CARE_PROVIDER_SITE_OTHER): Payer: Medicare Other | Admitting: Pharmacist

## 2016-12-07 VITALS — BP 158/72 | HR 66

## 2016-12-07 DIAGNOSIS — I10 Essential (primary) hypertension: Secondary | ICD-10-CM | POA: Diagnosis not present

## 2016-12-07 MED ORDER — SPIRONOLACTONE 25 MG PO TABS: 25.0000 mg | ORAL_TABLET | Freq: Every day | ORAL | 3 refills | Status: DC

## 2016-12-07 NOTE — Patient Instructions (Addendum)
Return for a  follow up appointment in 4 weeks  Your blood pressure today is 158/72 pulse 66  Check your blood pressure at home daily (if able) and keep record of the readings.  Take your BP meds as follows: Lisinopril/hctz 40/50 mg every morning amlodipine 10 mg every evening **Change Metoprolol succ 100 mg to every evening** **Start taking spironolactone 25mg  daily** **STOP taking potassium chloride**   Bring all of your meds, your BP cuff and your record of home blood pressures to your next appointment.  Exercise as you're able, try to walk approximately 30 minutes per day.  Keep salt intake to a minimum, especially watch canned and prepared boxed foods.  Eat more fresh fruits and vegetables and fewer canned items.  Avoid eating in fast food restaurants.    HOW TO TAKE YOUR BLOOD PRESSURE: . Rest 5 minutes before taking your blood pressure. .  Don't smoke or drink caffeinated beverages for at least 30 minutes before. . Take your blood pressure before (not after) you eat. . Sit comfortably with your back supported and both feet on the floor (don't cross your legs). . Elevate your arm to heart level on a table or a desk. . Use the proper sized cuff. It should fit smoothly and snugly around your bare upper arm. There should be enough room to slip a fingertip under the cuff. The bottom edge of the cuff should be 1 inch above the crease of the elbow. . Ideally, take 3 measurements at one sitting and record the average.

## 2016-12-13 ENCOUNTER — Encounter (INDEPENDENT_AMBULATORY_CARE_PROVIDER_SITE_OTHER): Payer: Self-pay | Admitting: Orthopedic Surgery

## 2016-12-13 ENCOUNTER — Ambulatory Visit (INDEPENDENT_AMBULATORY_CARE_PROVIDER_SITE_OTHER): Payer: Medicare Other

## 2016-12-13 ENCOUNTER — Ambulatory Visit (INDEPENDENT_AMBULATORY_CARE_PROVIDER_SITE_OTHER): Payer: Medicare Other | Admitting: Orthopedic Surgery

## 2016-12-13 DIAGNOSIS — M25551 Pain in right hip: Secondary | ICD-10-CM | POA: Diagnosis not present

## 2016-12-13 NOTE — Progress Notes (Signed)
Office Visit Note   Patient: Brenda [name]           Date of Birth: 28-Jan-1956           MRN: [id number] Visit Date: 12/13/2016 Requested by: Brenda Koch, MD Brenda [name] [id number] PCP: Brenda Koch, MD  Subjective: Chief Complaint  Patient presents with  . Right Hip - Pain    HPI: [name] is a 61 year old active patient with history of heart disease he needs to exercise he describes hip pain.  She states his right buttock pain primarily but also goes to the left side.  Been hurting her daily and is been worse with cold damp weather.  She does report radicular signs and symptoms affecting the right leg.  Describes numbness and tingling in the right foot.  She's having to do a lot of stretching with exercises.  She reports low back pain with activity especially cardiac and walking the incline.  Today she is some better.  She does do a lot of yoga.  She does report limited walking endurance.              ROS: All systems reviewed are negative as they relate to the chief complaint within the history of present illness.  Patient denies  fevers or chills.   Assessment & Plan: Visit Diagnoses:  1. Pain of right hip joint     Plan: Impression is spinal stenosis giving her limited walking endurance and early right-sided radicular symptoms.  Her back radiographs are pretty unremarkable that she does have fairly significant facet arthritis in the lower lumbar levels.  Her feet are perfused and her pulses are palpable sizing vascular claudication is less likely.  Neurogenic claudication is the more likely scenario that for that reason we will obtain MRI scan of the lumbar spine.  Ambulation and walking is important exercise avenue for her particular with her heart disease.  I'll see her back after that study  Follow-Up Instructions: No Follow-up on file.   Orders:  Orders Placed This Encounter  Procedures  . XR Lumbar Spine 2-3 Views  . MR Lumbar Spine w/o  contrast   No orders of the defined types were placed in this encounter.     Procedures: No procedures performed   Clinical Data: No additional findings.  Objective: Vital Signs: There were no vitals taken for this visit.  Physical Exam:   Constitutional: Patient appears well-developed HEENT:  Head: Normocephalic Eyes:EOM are normal Neck: Normal range of motion Cardiovascular: Normal rate Pulmonary/chest: Effort normal Neurologic: Patient is alert Skin: Skin is warm Psychiatric: Patient has normal mood and affect    Ortho Exam: Patient has palpable pedal pulses but they are diminished both feet are warm.  Knees have good range of motion.  There is no groin pain with internal/external rotation of the leg.  No definite nerve root tension signs today.  Some paresthesias in L5 distribution right versus left.  Negative Babinski negative clonus symmetric reflexes 1+ out of 4 bilateral patella and Achilles.  No trochanteric tenderness is noted today.  She does have some sciatic notch tenderness more on the right than the left  Specialty Comments:  No specialty comments available.  Imaging: Xr Lumbar Spine 2-3 Views  Result Date: 12/13/2016 AP lateral lumbar spine reviewed.  Visualized hips on the AP normal without arthritis.  Sacroiliac joints also normal.  Bilateral facet arthritis present at L4-5 L5-S1.  No scoliosis present.  No spondylolisthesis or significant  degenerative disc disease.  There is significant calcification of the aorta.    PMFS History: Patient Active Problem List   Diagnosis Date Noted  . Allergic rhinitis 02/25/2016  . Low back pain 09/29/2015  . Carpal tunnel syndrome, right 08/20/2015  . Hx of completed stroke 05/21/2015  . Memory impairment 05/21/2015  . Hyperthyroidism 12/29/2008  . HYPERCHOLESTEROLEMIA 12/29/2008  . DEPRESSION 12/29/2008  . Essential hypertension 12/29/2008  . Coronary atherosclerosis 12/29/2008  . CAROTID ARTERY STENOSIS  12/29/2008   Past Medical History:  Diagnosis Date  . Cancer [facility])    breast cancer  . Carotid artery stenosis   . CHF (congestive heart failure) ([place])   . Coronary artery disease   . Depression   . Hypercholesteremia   . Hypertension   . Hyperthyroidism   . Stroke [facility])     Family History  Problem Relation Age of Onset  . Lung cancer Mother     died at an early age  . Cancer Mother     lung  . Other      There is no Hx of premature coronary artery disease in her family    Past Surgical History:  Procedure Laterality Date  . ABDOMINAL HYSTERECTOMY    . BREAST LUMPECTOMY  1999  . BREAST LUMPECTOMY     left breast  . CARDIAC SURGERY    . CAROTID ENDARTERECTOMY  04-2000   left  . CORONARY ARTERY BYPASS GRAFT  2001   x 5 (left internal mammary artery to left anterior  descending coronary artery   Social History   Occupational History  . Social worker Unemployed   Social History Main Topics  . Smoking status: Former Smoker    Types: Cigarettes    Quit date: 09/11/2001  . Smokeless tobacco: Never Used  . Alcohol use No  . Drug use: No  . Sexual activity: Not on file

## 2016-12-21 ENCOUNTER — Ambulatory Visit (INDEPENDENT_AMBULATORY_CARE_PROVIDER_SITE_OTHER): Payer: Medicare Other | Admitting: Orthopedic Surgery

## 2016-12-22 ENCOUNTER — Ambulatory Visit
Admission: RE | Admit: 2016-12-22 | Discharge: 2016-12-22 | Disposition: A | Discharge status: Home / Self Care | Payer: Medicare Other | Source: Ambulatory Visit | Attending: Orthopedic Surgery | Admitting: Orthopedic Surgery

## 2016-12-22 DIAGNOSIS — M25551 Pain in right hip: Secondary | ICD-10-CM

## 2016-12-27 ENCOUNTER — Ambulatory Visit (INDEPENDENT_AMBULATORY_CARE_PROVIDER_SITE_OTHER): Payer: Medicare Other | Admitting: Orthopedic Surgery

## 2016-12-27 ENCOUNTER — Encounter (INDEPENDENT_AMBULATORY_CARE_PROVIDER_SITE_OTHER): Payer: Self-pay | Admitting: Orthopedic Surgery

## 2016-12-27 DIAGNOSIS — M5126 Other intervertebral disc displacement, lumbar region: Secondary | ICD-10-CM | POA: Diagnosis not present

## 2016-12-29 NOTE — Progress Notes (Signed)
Office Visit Note   Patient: Brenda [name]           Date of Birth: [birth year]           MRN: [id number] Visit Date: 12/27/2016 Requested by: [name], MD [place], [place] [id number] PCP: [name], MD  Subjective: Chief Complaint  Patient presents with  . Lower Back - Follow-up    HPI: Brenda [name] is a 61 year old female with back pain.  Since of [facility] she's had an MRI scan.  She describes the same symptoms that she did last clinic visit with no changes.  MRI scan is reviewed and it does show L5-S1 disc protrusion which hits the right S1 nerve root.  This is reviewed with the patient.  She also has L4-5 disc protrusion which abuts the right L4 nerve root.  She does describe right leg symptoms which radiate down to the foot.  She has to stop and squat because of gluteal pain when she walks.  She does have a history of thyroid cancer which is in remission.              ROS: All systems reviewed are negative as they relate to the chief complaint within the history of present illness.  Patient denies  fevers or chills.   Assessment & Plan: Visit Diagnoses:  1. Protrusion of lumbar intervertebral disc     Plan: Impression is disc protrusion on the right-hand side which clearly explains her right-sided leg symptoms.  She's not having as much back pain and thus I think she is an excellent candidate for lumbar spine epidural steroid injections.  Plan is for epidural steroid injections with referral to Dr. Katherine Roan see her back as needed  Follow-Up Instructions: No Follow-up on file.   Orders:  Orders Placed This Encounter  Procedures  . Ambulatory referral to Physical Medicine Rehab   No orders of the defined types were placed in this encounter.     Procedures: No procedures performed   Clinical Data: No additional findings.  Objective: Vital Signs: There were no vitals taken for this visit.  Physical Exam:   Constitutional: Patient  appears well-developed HEENT:  Head: Normocephalic Eyes:EOM are normal Neck: Normal range of motion Cardiovascular: Normal rate Pulmonary/chest: Effort normal Neurologic: Patient is alert Skin: Skin is warm Psychiatric: Patient has normal mood and affect    Ortho Exam: Orthopedic exam demonstrates normal gait alignment some Pierce seizes L5 distribution right versus left palpable pedal pulses no groin pain with internal/external rotation leg no other masses lymph adenopathy or skin changes noted in the right leg region muscle strength is good with no atrophy.  Reflexes symmetric.  Specialty Comments:  No specialty comments available.  Imaging: No results found.   PMFS History: Patient Active Problem List   Diagnosis Date Noted  . Allergic rhinitis 02/25/2016  . Low back pain 09/29/2015  . Carpal tunnel syndrome, right 08/20/2015  . Hx of completed stroke 05/21/2015  . Memory impairment 05/21/2015  . Hyperthyroidism 12/29/2008  . HYPERCHOLESTEROLEMIA 12/29/2008  . DEPRESSION 12/29/2008  . Essential hypertension 12/29/2008  . Coronary atherosclerosis 12/29/2008  . CAROTID ARTERY STENOSIS 12/29/2008   Past Medical History:  Diagnosis Date  . Cancer [facility])    breast cancer  . Carotid artery stenosis   . CHF (congestive heart failure) ([place])   . Coronary artery disease   . Depression   . Hypercholesteremia   . Hypertension   . Hyperthyroidism   .  Stroke [facility])     Family History  Problem Relation Age of Onset  . Lung cancer Mother     died at an early age  . Cancer Mother     lung  . Other      There is no Hx of premature coronary artery disease in her family    Past Surgical History:  Procedure Laterality Date  . ABDOMINAL HYSTERECTOMY    . BREAST LUMPECTOMY  1999  . BREAST LUMPECTOMY     left breast  . CARDIAC SURGERY    . CAROTID ENDARTERECTOMY  04-2000   left  . CORONARY ARTERY BYPASS GRAFT  2001   x 5 (left internal mammary artery to left anterior   descending coronary artery   Social History   Occupational History  . Social worker Unemployed   Social History Main Topics  . Smoking status: Former Smoker    Types: Cigarettes    Quit date: 09/11/2001  . Smokeless tobacco: Never Used  . Alcohol use No  . Drug use: No  . Sexual activity: Not on file

## 2017-01-01 ENCOUNTER — Other Ambulatory Visit: Payer: Self-pay | Admitting: *Deleted

## 2017-01-01 DIAGNOSIS — I10 Essential (primary) hypertension: Secondary | ICD-10-CM

## 2017-01-01 LAB — BASIC METABOLIC PANEL
BUN: 19 mg/dL (ref 7–25)
CO2: 27 mmol/L (ref 20–31)
Calcium: 10 mg/dL (ref 8.6–10.4)
Chloride: 103 mmol/L (ref 98–110)
Creat: 1.01 mg/dL — ABNORMAL HIGH (ref 0.50–0.99)
Glucose, Bld: 89 mg/dL (ref 65–99)
Potassium: 4.1 mmol/L (ref 3.5–5.3)
Sodium: 139 mmol/L (ref 135–146)

## 2017-01-04 ENCOUNTER — Ambulatory Visit (INDEPENDENT_AMBULATORY_CARE_PROVIDER_SITE_OTHER): Payer: Medicare Other | Admitting: Pharmacist Clinician (PhC)/ Clinical Pharmacy Specialist

## 2017-01-04 DIAGNOSIS — I10 Essential (primary) hypertension: Secondary | ICD-10-CM | POA: Diagnosis not present

## 2017-01-04 MED ORDER — SPIRONOLACTONE 25 MG PO TABS: 50.0000 mg | ORAL_TABLET | Freq: Every day | ORAL | 3 refills | Status: DC

## 2017-01-04 NOTE — Assessment & Plan Note (Signed)
Patient returns today with only slight improvements in home blood pressure readings.  Will increase spironolactone to 50 mg each morning and have her return in 4 weeks for follow up.  Will repeat BMET at that time as well.  She will continue to keep a blood pressure log and bring to her follow up visit.

## 2017-01-04 NOTE — Progress Notes (Signed)
Patient ID: Brenda [name]                 DOB: 20-Nov-1955                      MRN: [id number]     HPI:  Brenda [name] is a 61 y.o. female patient of Dr Stanford Breed, with a PMH below who presents today for hypertension clinic. PMH significant for breast cancer, CABG x 5, carotid endarterectomy in 2001 and hypertension.   Patient presents today for hypertension follow up.  She notes today that the last month has been difficult, both emotionally and physically.  She has some family issues that lead her to be teary-eyed in the office today, and has also been dealing with an inflamed sciatic nerve.  For this she will get an injection next week.  This combination of stresses has led her to decrease some of her exercises, but has noted that continued yoga does make her feel better.   Current HTN meds:  Lisinopril/hctz 40/50 mg every morning Amlodipine 10 mg every evening Metoprolol succ 100 mg every evening Spironolactone 25 mg every morning  BP goal: <130/80  Family History: Mother died from cancer when patient was 44, lived in foster care and with older aunts/uncles. Family history unknown.  Social History: Quit smoking and alcohol about 20 years ago; drinks 1 cup of coffee most days  Diet: Eats mostly healthy, home cooked foods; enjoys vegetables and salmon, admits to occasionally "cheating" with chips - probably more so in the past 2-3 weeks;   Exercise: Several days per week, enjoys yoga, treadmill, cycle (cycling on hold until after injection  Home BP readings: 20 readings (all morning prior to taking medication); 168/74 (range 156-180/66-84) down slightly from last month average of 171/74 average.  Wt Readings from Last 3 Encounters:  09/19/16 166 lb (75.3 kg)  02/25/16 166 lb (75.3 kg)  02/04/16 167 lb 9.6 oz (76 kg)   BP Readings from Last 3 Encounters:  12/07/16 (!) 158/72  11/19/16 172/71  11/14/16 (!) 182/84   Pulse Readings from Last 3 Encounters:  12/07/16 66  11/19/16 75   10/24/16 60    Past Medical History:  Diagnosis Date  . Cancer [facility])    breast cancer  . Carotid artery stenosis   . CHF (congestive heart failure) ([place])   . Coronary artery disease   . Depression   . Hypercholesteremia   . Hypertension   . Hyperthyroidism   . Stroke [facility])     Current Outpatient Prescriptions on File Prior to Visit  Medication Sig Dispense Refill  . amLODipine (NORVASC) 10 MG tablet Take 1 tablet (10 mg total) by mouth daily. 30 tablet 5  . aspirin 81 MG tablet Take 81 mg by mouth daily.      . Cholecalciferol (VITAMIN D) 1000 UNITS capsule Take 2,000 Units by mouth daily.     Marland Kitchen lisinopril-hydrochlorothiazide (PRINZIDE,ZESTORETIC) 20-25 MG tablet Take 2 tablets by mouth daily. 180 tablet 1  . metoprolol succinate (TOPROL-XL) 100 MG 24 hr tablet TAKE ONE TABLET BY MOUTH ONCE DAILY WITH OR IMMEDIATELY FOLLOWING A MEAL. 30 tablet 1  . Multiple Vitamin (MULTIVITAMIN) tablet Take 1 tablet by mouth daily.      . naproxen (NAPROSYN) 500 MG tablet Take 1 tablet (500 mg total) by mouth 2 (two) times daily with a meal. 20 tablet 0  . nitroGLYCERIN (NITROSTAT) 0.4 MG SL tablet Place 1 tablet (0.4 mg total) under the  tongue every 5 (five) minutes as needed for chest pain (x 3 tabs daily). 20 tablet 1  . rosuvastatin (CRESTOR) 40 MG tablet Take 1 tablet (40 mg total) by mouth daily. 90 tablet 3  . spironolactone (ALDACTONE) 25 MG tablet Take 1 tablet (25 mg total) by mouth daily. 30 tablet 3   No current facility-administered medications on file prior to visit.     Allergies  Allergen Reactions  . Latex     unknown    There were no vitals taken for this visit.  Essential hypertension:     Blood pressure today significantly improved from previous readings but remains above goal of 130/80. Patient denies any symptoms or ADRs to current therapy. Most recent BMET shows stable renal function and electrolytes as well.   Amlodipine and Zestoretic are at maximum daily dose  already, metoprolol at maximum tolerated dose with HR ranging from 60-72 bpm.   Will add spironolactone 25mg  daily to therapy, discontinue potassium chloride, move metoprolol dose to every evening, and repeat BMET in 2 weeks.  Patient encouraged to stay hydrated, keep BP log, and call clinic if experiencing any problems or ADRs.  Next follow up in 4 weeks.  Tommy Medal PharmD, CPP [place] [id number] 01/04/2017 7:13 AM

## 2017-01-04 NOTE — Patient Instructions (Addendum)
Return for a a follow up appointment in 1 month  Your blood pressure today is 176/82 (goal is < 130/80)  Check your blood pressure at home daily and keep record of the readings.  Take your BP meds as follows:  Lisinopril/hctz 40/50 mg every morning  amlodipine 10 mg every evening  Metoprolol succ 100 mg every evening  Spironolactone 50 mg every morning  (new dose)  Bring all of your meds, your BP cuff and your record of home blood pressures to your next appointment.  Exercise as you're able, try to walk approximately 30 minutes per day.  Keep salt intake to a minimum, especially watch canned and prepared boxed foods.  Eat more fresh fruits and vegetables and fewer canned items.  Avoid eating in fast food restaurants.    HOW TO TAKE YOUR BLOOD PRESSURE: . Rest 5 minutes before taking your blood pressure. .  Don't smoke or drink caffeinated beverages for at least 30 minutes before. . Take your blood pressure before (not after) you eat. . Sit comfortably with your back supported and both feet on the floor (don't cross your legs). . Elevate your arm to heart level on a table or a desk. . Use the proper sized cuff. It should fit smoothly and snugly around your bare upper arm. There should be enough room to slip a fingertip under the cuff. The bottom edge of the cuff should be 1 inch above the crease of the elbow. . Ideally, take 3 measurements at one sitting and record the average.

## 2017-01-09 ENCOUNTER — Ambulatory Visit (INDEPENDENT_AMBULATORY_CARE_PROVIDER_SITE_OTHER): Payer: Medicare Other | Admitting: Physical Medicine and Rehabilitation

## 2017-01-09 ENCOUNTER — Ambulatory Visit (INDEPENDENT_AMBULATORY_CARE_PROVIDER_SITE_OTHER): Payer: Self-pay

## 2017-01-09 ENCOUNTER — Encounter (INDEPENDENT_AMBULATORY_CARE_PROVIDER_SITE_OTHER): Payer: Self-pay | Admitting: Physical Medicine and Rehabilitation

## 2017-01-09 VITALS — BP 179/73 | HR 62

## 2017-01-09 DIAGNOSIS — M5416 Radiculopathy, lumbar region: Secondary | ICD-10-CM | POA: Diagnosis not present

## 2017-01-09 MED ORDER — IIOPAMIDOL (ISOVUE-250) INJECTION 51%
3.0000 mL | Freq: Once | INTRAVENOUS | Status: AC
  Administered 2017-01-09: 3 mL
  Filled 2017-01-09: qty 50

## 2017-01-09 MED ORDER — LIDOCAINE HCL (PF) 1 % IJ SOLN
2.0000 mL | Freq: Once | INTRAMUSCULAR | Status: AC
  Administered 2017-01-09: 2 mL

## 2017-01-09 MED ORDER — METHYLPREDNISOLONE ACETATE 80 MG/ML IJ SUSP
80.0000 mg | Freq: Once | INTRAMUSCULAR | Status: AC
  Administered 2017-01-09: 80 mg

## 2017-01-09 NOTE — Patient Instructions (Signed)

## 2017-01-09 NOTE — Procedures (Signed)
Lumbar Epidural Steroid Injection - Interlaminar Approach with Fluoroscopic Guidance  Patient: Brenda [name]      Date of Birth: Dec 07, 1955 MRN: [id number] PCP: [name], MD      Visit Date: 01/09/2017   Universal Protocol:    Date/Time: 01/10/1811:40 PM  Consent Given By: the patient  Position: PRONE  Additional Comments: Vital signs were monitored before and after the procedure. Patient was prepped and draped in the usual sterile fashion. The correct patient, procedure, and site was verified.   Injection Procedure Details:  Procedure Site One Meds Administered: No orders of the defined types were placed in this encounter.    Laterality: Right  Location/Site:  L5-S1  Needle size: 20 G  Needle type: Tuohy  Needle Placement: Paramedian epidural  Findings:  -Contrast Used: 1 mL iohexol 180 mg iodine/mL   -Comments: Excellent flow of contrast into the epidural space.  Procedure Details: Using a paramedian approach from the side mentioned above, the region overlying the inferior lamina was localized under fluoroscopic visualization and the soft tissues overlying this structure were infiltrated with 4 ml. of 1% Lidocaine without Epinephrine. The Tuohy needle was inserted into the epidural space using a paramedian approach.   The epidural space was localized using loss of resistance along with lateral and bi-planar fluoroscopic views.  After negative aspirate for air, blood, and CSF, a 2 ml. volume of Isovue-250 was injected into the epidural space and the flow of contrast was observed. Radiographs were obtained for documentation purposes.    The injectate was administered into the level noted above.   Additional Comments:  The patient tolerated the procedure well Dressing: Band-Aid    Post-procedure details: Patient was observed during the procedure. Post-procedure instructions were reviewed.  Patient left the clinic in stable condition.

## 2017-01-09 NOTE — Progress Notes (Deleted)
Right side low back into buttock and down leg to foot. Off and on for a year.Tingling in foot. Some pain on the left side also but not as severe. Pain worse with increased activity. Denies any leg weakness.

## 2017-01-09 NOTE — Progress Notes (Signed)
Brenda [name] - 61 y.o. female MRN [id number]  Date of birth: 1955-11-27  Office Visit Note: Visit Date: 01/09/2017 PCP: [name], MD Referred by: [name], *  Subjective: Chief Complaint  Patient presents with  . Lower Back - Pain   HPI: Brenda [name] is a very pleasant and very active 61 year old female with complicated course of thyroid cancer and heart disease who is complaining of chronic worsening severe at times right buttock pain posteriorly that will radiate down the leg with some tingling paresthesias in the foot. The tingling paresthesias is somewhat dorsal and lateral in the foot. She gets some pain on the left side of lower back but her main complaints are right sided. She denies any focal weakness or focal night time pain. She is not awakened at night. She's had no unexplained weight loss. She first started seeing Dr. Marlou Sa in April with these complaints and these complaints been ongoing for a year. She has tried different medications including anti-inflammatories without much relief and she can only take those a limited basis. She continues with cardiac physical therapy and yoga and does exercise frequently. She reports being able to exercise some but she does get some increased pain in certain positions. She did have questions today regarding the type of exercises she should and shouldn't be doing. She's had no prior spine surgery. She's had no prior spinal interventions. Dr. Marlou Sa did order an MRI of the lumbar spine which is reviewed below. There is notably a right paracentral disc protrusion with moderate right lateral recess narrowing which could affect the S1 nerve root.    Review of Systems  Constitutional: Negative for chills, fever, malaise/fatigue and weight loss.  HENT: Negative for hearing loss and sinus pain.   Eyes: Negative for blurred vision, double vision and photophobia.  Respiratory: Negative for cough and shortness of breath.     Cardiovascular: Negative for chest pain, palpitations and leg swelling.  Gastrointestinal: Negative for abdominal pain, nausea and vomiting.  Genitourinary: Negative for flank pain.  Musculoskeletal: Positive for back pain. Negative for myalgias.  Skin: Negative for itching and rash.  Neurological: Positive for tingling. Negative for tremors, focal weakness and weakness.  Endo/Heme/Allergies: Negative.   Psychiatric/Behavioral: Negative for depression.  All other systems reviewed and are negative.  Otherwise per HPI.  Assessment & Plan: Visit Diagnoses:  1. Lumbar radiculopathy     Plan: Findings:  Chronic worsening right buttock And leg pain consistent with an L5 and S1 radiculitis radiculopathy. Moreover symptoms are likely S1 related. This fits with her MRI findings. Her physical exam shows no focal deficits and equivocally positive slump test on the right. Her back pain may be related predominantly to this but she also has some facet arthropathy as well. This is moderate at L4-5. I think some of her back pain is mechanical and probably facet mediated. I think the best approach is a diagnostic and therapeutic right L5-S1 intralaminar epidural steroid injection. We'll do that today. Depending on the relief we would look at regrouping with a physical therapist for different set up approaches and really get her a good understanding of exercises. I did go over the exercises that she currently doesn't we discussed activity modification. We also discussed the natural history of disc herniations and how this should resolve over time even though it has been a year. I spent more than 25 minutes speaking face-to-face with the patient with 50% of the time in counseling.    Meds &  Orders:  Meds ordered this encounter  Medications  . lidocaine (PF) (XYLOCAINE) 1 % injection 2 mL  . iopamidol (ISOVUE-250) 51 % injection 3 mL  . methylPREDNISolone acetate (DEPO-MEDROL) injection 80 mg    Orders  Placed This Encounter  Procedures  . XR C-ARM NO REPORT  . Epidural Steroid injection    Follow-up: Return if symptoms worsen or fail to improve.   Procedures: No procedures performed  Lumbar Epidural Steroid Injection - Interlaminar Approach with Fluoroscopic Guidance  Patient: Brenda [name]      Date of Birth: 24-Jun-1956 MRN: [id number] PCP: [name], MD      Visit Date: 01/09/2017   Universal Protocol:    Date/Time: 01/10/1811:40 PM  Consent Given By: the patient  Position: PRONE  Additional Comments: Vital signs were monitored before and after the procedure. Patient was prepped and draped in the usual sterile fashion. The correct patient, procedure, and site was verified.   Injection Procedure Details:  Procedure Site One Meds Administered: No orders of the defined types were placed in this encounter.    Laterality: Right  Location/Site:  L5-S1  Needle size: 20 G  Needle type: Tuohy  Needle Placement: Paramedian epidural  Findings:  -Contrast Used: 1 mL iohexol 180 mg iodine/mL   -Comments: Excellent flow of contrast into the epidural space.  Procedure Details: Using a paramedian approach from the side mentioned above, the region overlying the inferior lamina was localized under fluoroscopic visualization and the soft tissues overlying this structure were infiltrated with 4 ml. of 1% Lidocaine without Epinephrine. The Tuohy needle was inserted into the epidural space using a paramedian approach.   The epidural space was localized using loss of resistance along with lateral and bi-planar fluoroscopic views.  After negative aspirate for air, blood, and CSF, a 2 ml. volume of Isovue-250 was injected into the epidural space and the flow of contrast was observed. Radiographs were obtained for documentation purposes.    The injectate was administered into the level noted above.   Additional Comments:  The patient tolerated the procedure well Dressing:  Band-Aid    Post-procedure details: Patient was observed during the procedure. Post-procedure instructions were reviewed.  Patient left the clinic in stable condition.     Clinical History: Lumbar Spine MRI 12/22/2016 L4-5: Disc bulging with a small right foraminal disc protrusion which contacts the exiting right L4 nerve without evidence of neural compression. Ligamentum flavum thickening and moderate facet arthrosis. Mild right neural foraminal stenosis. No spinal stenosis.  L5-S1: Disc bulging, broad right paracentral disc protrusion, and mild facet arthrosis result in moderate right lateral recess and mild right neural foraminal stenosis. Potential right S1 nerve root impingement. No spinal stenosis.  IMPRESSION: 1. L5-S1 disc protrusion with right lateral recess stenosis, potentially affecting the right S1 nerve root. Mild right foraminal stenosis. 2. Small right foraminal disc protrusion at L4-5 contacting the right L4 nerve.  She reports that she quit smoking about 15 years ago. Her smoking use included Cigarettes. She has never used smokeless tobacco. No results for input(s): HGBA1C, LABURIC in the last 8760 hours.  Objective:  VS:  HT:    WT:   BMI:     BP:(!) 179/73  HR:62bpm  TEMP: ( )  RESP:99 % Physical Exam  Constitutional: She is oriented to person, place, and time. She appears well-developed and well-nourished.  Eyes: Conjunctivae and EOM are normal. Pupils are equal, round, and reactive to light.  Cardiovascular: Normal rate and intact distal pulses.  Pulmonary/Chest: Effort normal.  Musculoskeletal:  Patient ambulates without aid with slightly forward flexed spine. She has good distal strength. No pain with hip rotation.  Neurological: She is alert and oriented to person, place, and time.  Skin: Skin is warm and dry. No rash noted. No erythema.  Psychiatric: She has a normal mood and affect. Her behavior is normal.  Nursing note and vitals  reviewed.   Ortho Exam Imaging: No results found.  Past Medical/Family/Surgical/Social History: Medications & Allergies reviewed per EMR Patient Active Problem List   Diagnosis Date Noted  . Allergic rhinitis 02/25/2016  . Low back pain 09/29/2015  . Carpal tunnel syndrome, right 08/20/2015  . Hx of completed stroke 05/21/2015  . Memory impairment 05/21/2015  . Hyperthyroidism 12/29/2008  . HYPERCHOLESTEROLEMIA 12/29/2008  . DEPRESSION 12/29/2008  . Essential hypertension 12/29/2008  . Coronary atherosclerosis 12/29/2008  . CAROTID ARTERY STENOSIS 12/29/2008   Past Medical History:  Diagnosis Date  . Cancer [facility])    breast cancer  . Carotid artery stenosis   . CHF (congestive heart failure) ([place])   . Coronary artery disease   . Depression   . Hypercholesteremia   . Hypertension   . Hyperthyroidism   . Stroke [facility])    Family History  Problem Relation Age of Onset  . Lung cancer Mother     died at an early age  . Cancer Mother     lung  . Other      There is no Hx of premature coronary artery disease in her family   Past Surgical History:  Procedure Laterality Date  . ABDOMINAL HYSTERECTOMY    . BREAST LUMPECTOMY  1999  . BREAST LUMPECTOMY     left breast  . CARDIAC SURGERY    . CAROTID ENDARTERECTOMY  04-2000   left  . CORONARY ARTERY BYPASS GRAFT  2001   x 5 (left internal mammary artery to left anterior  descending coronary artery   Social History   Occupational History  . Social worker Unemployed   Social History Main Topics  . Smoking status: Former Smoker    Types: Cigarettes    Quit date: 09/11/2001  . Smokeless tobacco: Never Used  . Alcohol use No  . Drug use: No  . Sexual activity: Not on file

## 2017-01-13 ENCOUNTER — Other Ambulatory Visit: Payer: Self-pay | Admitting: Cardiology

## 2017-01-13 DIAGNOSIS — R0602 Shortness of breath: Secondary | ICD-10-CM

## 2017-01-13 DIAGNOSIS — I2510 Atherosclerotic heart disease of native coronary artery without angina pectoris: Secondary | ICD-10-CM

## 2017-01-13 DIAGNOSIS — I10 Essential (primary) hypertension: Secondary | ICD-10-CM

## 2017-01-13 DIAGNOSIS — E7800 Pure hypercholesterolemia, unspecified: Secondary | ICD-10-CM

## 2017-01-16 ENCOUNTER — Other Ambulatory Visit: Payer: Self-pay | Admitting: Cardiology

## 2017-01-16 DIAGNOSIS — E7800 Pure hypercholesterolemia, unspecified: Secondary | ICD-10-CM

## 2017-01-16 DIAGNOSIS — I2510 Atherosclerotic heart disease of native coronary artery without angina pectoris: Secondary | ICD-10-CM

## 2017-01-16 DIAGNOSIS — I10 Essential (primary) hypertension: Secondary | ICD-10-CM

## 2017-01-16 DIAGNOSIS — R0602 Shortness of breath: Secondary | ICD-10-CM

## 2017-01-17 NOTE — Telephone Encounter (Signed)
REFILL 

## 2017-01-18 ENCOUNTER — Telehealth (INDEPENDENT_AMBULATORY_CARE_PROVIDER_SITE_OTHER): Payer: Self-pay | Admitting: Physical Medicine and Rehabilitation

## 2017-01-18 NOTE — Telephone Encounter (Signed)
Right S1 tf

## 2017-01-18 NOTE — Telephone Encounter (Signed)
Please advise. L5-S1 IL 01/09/17.

## 2017-01-18 NOTE — Telephone Encounter (Signed)
Patient called saying the injection she received last week isn't helping with her pain. She was having body pains all over but now the pain in back mainly in her right leg/foot. CB # [phone]

## 2017-01-18 NOTE — Telephone Encounter (Signed)
Called patient to schedule. No answer and mailbox full.

## 2017-01-23 NOTE — Telephone Encounter (Signed)
Scheduled for 01/31/17 at 1400 with driver.

## 2017-01-31 ENCOUNTER — Ambulatory Visit (INDEPENDENT_AMBULATORY_CARE_PROVIDER_SITE_OTHER): Payer: Medicare Other

## 2017-01-31 ENCOUNTER — Other Ambulatory Visit: Payer: Self-pay

## 2017-01-31 ENCOUNTER — Ambulatory Visit (INDEPENDENT_AMBULATORY_CARE_PROVIDER_SITE_OTHER): Payer: Medicare Other | Admitting: Physical Medicine and Rehabilitation

## 2017-01-31 ENCOUNTER — Encounter (INDEPENDENT_AMBULATORY_CARE_PROVIDER_SITE_OTHER): Payer: Self-pay | Admitting: Physical Medicine and Rehabilitation

## 2017-01-31 VITALS — BP 160/65 | HR 80

## 2017-01-31 DIAGNOSIS — I10 Essential (primary) hypertension: Secondary | ICD-10-CM

## 2017-01-31 DIAGNOSIS — M5116 Intervertebral disc disorders with radiculopathy, lumbar region: Secondary | ICD-10-CM

## 2017-01-31 DIAGNOSIS — M5416 Radiculopathy, lumbar region: Secondary | ICD-10-CM | POA: Diagnosis not present

## 2017-01-31 LAB — BASIC METABOLIC PANEL WITH GFR
BUN: 18 mg/dL (ref 7–25)
CO2: 28 mmol/L (ref 20–31)
Calcium: 10.3 mg/dL (ref 8.6–10.4)
Chloride: 105 mmol/L (ref 98–110)
Creat: 1.04 mg/dL — ABNORMAL HIGH (ref 0.50–0.99)
Glucose, Bld: 91 mg/dL (ref 65–99)
Potassium: 4.1 mmol/L (ref 3.5–5.3)
Sodium: 142 mmol/L (ref 135–146)

## 2017-01-31 MED ORDER — LIDOCAINE HCL (PF) 1 % IJ SOLN
2.0000 mL | Freq: Once | INTRAMUSCULAR | Status: AC
  Administered 2017-01-31: 2 mL

## 2017-01-31 MED ORDER — METHYLPREDNISOLONE ACETATE 80 MG/ML IJ SUSP
80.0000 mg | Freq: Once | INTRAMUSCULAR | Status: AC
  Administered 2017-01-31: 80 mg

## 2017-01-31 NOTE — Progress Notes (Deleted)
Patient states she had less than 50 % relief with last injection. Pain across back into buttocks. Right=left. Radiates down right leg to foot. Tingling in foot. Relief with sitting and squatting. Calf pain since last week.

## 2017-01-31 NOTE — Patient Instructions (Signed)

## 2017-02-01 ENCOUNTER — Ambulatory Visit (INDEPENDENT_AMBULATORY_CARE_PROVIDER_SITE_OTHER): Payer: Medicare Other | Admitting: Pharmacist

## 2017-02-01 ENCOUNTER — Ambulatory Visit: Payer: Medicare Other | Admitting: Oncology

## 2017-02-01 VITALS — BP 168/64 | HR 74

## 2017-02-01 DIAGNOSIS — I10 Essential (primary) hypertension: Secondary | ICD-10-CM | POA: Diagnosis not present

## 2017-02-01 NOTE — Procedures (Signed)
S1 Lumbosacral Transforaminal Epidural Steroid Injection - Sub-Pedicular Approach with Fluoroscopic Guidance   Patient: Brenda [name]      Date of Birth: 1955/11/21 MRN: [id number] PCP: [name], MD      Visit Date: 01/31/2017  Mrs. Klose is a 61 year old female that recently saw with low back and right hip and leg pain consistent with paracentral disc protrusion and osteoarthritis of the spine. We completed an epidural injection and she got some relief but was very short-lived. Within a complete a right S1 transforaminal injection hopefully get her a little bit more relief. We may end up again facet joint blocks as well versus regrouping with a different therapist. She is very active and continues to be active in doing okay.  Universal Protocol:    Date/Time: 05/24/181:26 PM  Consent Given By: the patient  Position:  PRONE  Additional Comments: Vital signs were monitored before and after the procedure. Patient was prepped and draped in the usual sterile fashion. The correct patient, procedure, and site was verified.   Injection Procedure Details:  Procedure Site One Meds Administered:  Meds ordered this encounter  Medications  . lidocaine (PF) (XYLOCAINE) 1 % injection 2 mL  . methylPREDNISolone acetate (DEPO-MEDROL) injection 80 mg    Laterality: Right  Location/Site:  S1 Foramen   Needle size: 22 G  Needle type: Spinal  Needle Placement: Transforaminal  Findings:  -Contrast Used: 1 mL iohexol 180 mg iodine/mL   -Comments: Excellent flow of contrast along the nerve and into the epidural space. Patient initially had paresthesia in an S1 distribution with needle placement. Patient overall did extremely well with the injection however.  Procedure Details: After squaring off the sacral end-plate to get a true AP view, the C-arm was positioned so that the best possible view of the S1 foramen was visualized. The soft tissues overlying this structure were  infiltrated with 2-3 ml. of 1% Lidocaine without Epinephrine.    The spinal needle was inserted toward the target using a "trajectory" view along the fluoroscope beam.  Under AP and lateral visualization, the needle was advanced so it did not puncture dura. Biplanar projections were used to confirm position. Aspiration was confirmed to be negative for CSF and/or blood. A 1-2 ml. volume of Isovue-250 was injected and flow of contrast was noted at each level. Radiographs were obtained for documentation purposes.   After attaining the desired flow of contrast documented above, a 0.5 to 1.0 ml test dose of 0.25% Marcaine was injected into each respective transforaminal space.  The patient was observed for 90 seconds post injection.  After no sensory deficits were reported, and normal lower extremity motor function was noted,   the above injectate was administered so that equal amounts of the injectate were placed at each foramen (level) into the transforaminal epidural space.   Additional Comments:  No complications occurred Dressing: Band-Aid    Post-procedure details: Patient was observed during the procedure. Post-procedure instructions were reviewed.  Patient left the clinic in stable condition.

## 2017-02-01 NOTE — Patient Instructions (Addendum)
Return for a a follow up appointment in 5 weeks  Your blood pressure today is 168/64 pulse 74  Check your blood pressure at home daily (if able) and keep record of the readings.  Take your BP meds as follows: **INCREASE spironolactone to 50mg  daily (25mg  x 2 tablets)** **ALL other medication as prescribed**   Bring all of your meds, your BP cuff and your record of home blood pressures to your next appointment.  Exercise as you're able, try to walk approximately 30 minutes per day.  Keep salt intake to a minimum, especially watch canned and prepared boxed foods.  Eat more fresh fruits and vegetables and fewer canned items.  Avoid eating in fast food restaurants.    HOW TO TAKE YOUR BLOOD PRESSURE: . Rest 5 minutes before taking your blood pressure. .  Don't smoke or drink caffeinated beverages for at least 30 minutes before. . Take your blood pressure before (not after) you eat. . Sit comfortably with your back supported and both feet on the floor (don't cross your legs). . Elevate your arm to heart level on a table or a desk. . Use the proper sized cuff. It should fit smoothly and snugly around your bare upper arm. There should be enough room to slip a fingertip under the cuff. The bottom edge of the cuff should be 1 inch above the crease of the elbow. . Ideally, take 3 measurements at one sitting and record the average.

## 2017-02-01 NOTE — Progress Notes (Signed)
Patient ID: Brenda [name]                 DOB: Aug 15, 1956                      MRN: [id number]     HPI: Brenda [name] is a 61 y.o. female referred by Dr. Stanford Breed to HTN clinic. PMH significant for breast cancer, CABG x 5, carotid endarterectomy in 2001 and hypertension.  She has some family issues and has also been dealing with an inflamed sciatic nerve.   Noted spironolactone dose was increased from 25mg  daily to 50mg  daily during last OV. BMET repeated 3 weeks after dose change show stable renal function with Scr = 1.04 and all electrolytes within normal limits.    Patient presents to clinic today for follow up. Denies dizziness, headaches, fatigue, chest pain or ADR to medication.  Reports getting a second injection for sciatic nerve pain yesterday with mayor improvement on her pain.  She never increased her spironolactone dose to 50mg  and still taking 25mg  daily.  Admits to some issues with compliance (missing few doses) due to increased stress at home.   Current HTN meds: Lisinopril/hctz 40/50 mg every morning Amlodipine 10 mg every evening Metoprolol succ 100 mg every evening Spironolactone 25 mg every morning  BP goal: <130/80  Family History: Mother died from cancer when patient was 80, lived in foster care and with older aunts/uncles. Family history unknown.  Social History: Quit smoking and alcohol about 20 years ago; drinks 1 cup of coffee most days  Diet:Eats mostly healthy, home cooked foods; enjoys vegetables and salmon, admits to occasionally "cheating" with chips - probably more so in the past 2-3 weeks;   Exercise:Several days per week, enjoys yoga, treadmill, cycle (cycling on hold until after injection)  Home BP readings:20 reading; average 169/75 (range 138-195/64-81); pulse 57-80  Wt Readings from Last 3 Encounters:  09/19/16 166 lb (75.3 kg)  02/25/16 166 lb (75.3 kg)  02/04/16 167 lb 9.6 oz (76 kg)   BP Readings from Last 3 Encounters:  02/01/17 (!)  168/64  01/31/17 (!) 160/65  01/09/17 (!) 179/73   Pulse Readings from Last 3 Encounters:  02/01/17 74  01/31/17 80  01/09/17 62    Past Medical History:  Diagnosis Date  . Cancer [facility])    breast cancer  . Carotid artery stenosis   . CHF (congestive heart failure) ([place])   . Coronary artery disease   . Depression   . Hypercholesteremia   . Hypertension   . Hyperthyroidism   . Stroke [facility])     Current Outpatient Prescriptions on File Prior to Visit  Medication Sig Dispense Refill  . amLODipine (NORVASC) 10 MG tablet Take 1 tablet (10 mg total) by mouth daily. 30 tablet 5  . aspirin 81 MG tablet Take 81 mg by mouth daily.      . Cholecalciferol (VITAMIN D) 1000 UNITS capsule Take 2,000 Units by mouth daily.     Marland Kitchen lisinopril-hydrochlorothiazide (PRINZIDE,ZESTORETIC) 20-25 MG tablet Take 2 tablets by mouth daily. 180 tablet 1  . metoprolol succinate (TOPROL-XL) 100 MG 24 hr tablet TAKE 1 TABLET BY MOUTH ONCE DAILY WITH  OR  IMMEDIATELY  FOLLOWING  A  MEAL 90 tablet 3  . Multiple Vitamin (MULTIVITAMIN) tablet Take 1 tablet by mouth daily.      . naproxen (NAPROSYN) 500 MG tablet Take 1 tablet (500 mg total) by mouth 2 (two) times daily with a meal. 20  tablet 0  . nitroGLYCERIN (NITROSTAT) 0.4 MG SL tablet Place 1 tablet (0.4 mg total) under the tongue every 5 (five) minutes as needed for chest pain (x 3 tabs daily). 20 tablet 1  . rosuvastatin (CRESTOR) 40 MG tablet Take 1 tablet (40 mg total) by mouth daily. 90 tablet 3  . spironolactone (ALDACTONE) 25 MG tablet Take 2 tablets (50 mg total) by mouth daily. 60 tablet 3   No current facility-administered medications on file prior to visit.     Allergies  Allergen Reactions  . Latex     unknown    Blood pressure (!) 168/64, pulse 74.  Essential hypertension:  Blood pressure remains elevated above desired goal of <130/80 in office and at home. Some readings at home reached systolic in 451Q but patient never increased her  chlorthalidone dose as instructed and also having issues with compliance.  Will increase spironolactone to 50mg  daily today, repeat BMET in 10-14 days and follow up on 4 weeks at HTN clinic. Plan to add clonidine to therapy if systolic BP remains >604N.  Raquel Rodriguez-Guzman PharmD, [place] [id number] 02/01/2017 2:22 PM

## 2017-02-02 ENCOUNTER — Ambulatory Visit: Payer: Medicare Other | Admitting: Oncology

## 2017-02-06 ENCOUNTER — Telehealth (INDEPENDENT_AMBULATORY_CARE_PROVIDER_SITE_OTHER): Payer: Self-pay

## 2017-02-06 DIAGNOSIS — M47816 Spondylosis without myelopathy or radiculopathy, lumbar region: Secondary | ICD-10-CM

## 2017-02-06 DIAGNOSIS — M5116 Intervertebral disc disorders with radiculopathy, lumbar region: Secondary | ICD-10-CM

## 2017-02-06 NOTE — Telephone Encounter (Signed)
Patient came by today. Had injection on 01/31/17 Rt S1 TF. Did well thurs, Friday, and Sat. Mowed her lawn on Sunday and has had increased pain ever since. Tried ibuprofen and aleve with no relief and seems to be more on the left side. Wants to know what you recommend.

## 2017-02-06 NOTE — Telephone Encounter (Signed)
If her right side pain is better and staying better and now just more left back pain without referral down leg then we could do facet joint injection for OA.  If right side still bad and or left is referring down the leg she might need to regroup with PT,opinion from spine surgeon/neurosurgeon and/or we could start a medication like Gabapentin if she has not already tried. OV to talk is also an option

## 2017-02-07 ENCOUNTER — Telehealth (INDEPENDENT_AMBULATORY_CARE_PROVIDER_SITE_OTHER): Payer: Self-pay | Admitting: Radiology

## 2017-02-07 ENCOUNTER — Encounter (INDEPENDENT_AMBULATORY_CARE_PROVIDER_SITE_OTHER): Payer: Self-pay | Admitting: Radiology

## 2017-02-07 DIAGNOSIS — M5116 Intervertebral disc disorders with radiculopathy, lumbar region: Secondary | ICD-10-CM

## 2017-02-07 MED ORDER — GABAPENTIN 300 MG PO CAPS: ORAL_CAPSULE | ORAL | 0 refills | Status: DC

## 2017-02-07 MED ORDER — TRAMADOL HCL 50 MG PO TABS: 50.0000 mg | ORAL_TABLET | Freq: Three times a day (TID) | ORAL | 0 refills | Status: DC | PRN

## 2017-02-07 NOTE — Telephone Encounter (Signed)
Faxed rx to [facility] [phone] and called pt and advised and she will call them this morning to get scheduled

## 2017-02-07 NOTE — Telephone Encounter (Signed)
Referral made to Barbaraann Barthel, please make sure she gets and Belia Heman gets, he is at [facility]

## 2017-02-07 NOTE — Telephone Encounter (Signed)
Please advise 

## 2017-02-07 NOTE — Telephone Encounter (Signed)
Called patient to advise. She will call us if no relief from Tramadol and Gabapentin and pain continues to be severe.

## 2017-02-07 NOTE — Telephone Encounter (Signed)
Sent in tramadol and gabapentin, if no benefit and that severe, needs OV. I wrote rx again for PT. She may need Yates/Nitka/neuro

## 2017-02-07 NOTE — Addendum Note (Signed)
Addended by: Raymondo Band on: 02/07/2017 08:21 AM   Modules accepted: Orders

## 2017-02-07 NOTE — Telephone Encounter (Signed)
Patient called and said that her pain is so bad she can hardly walk from room to room.  She is icing/using heat.  Her left buttock/troch area and down to the posterior calf hurt badly.  Her right posterior calf hurts when she tries to walk.  She has right greater than left foot numbness at times.  Her PT appt is not until the 13th.  What can we do for her?  Please call her.

## 2017-02-14 ENCOUNTER — Ambulatory Visit (INDEPENDENT_AMBULATORY_CARE_PROVIDER_SITE_OTHER): Payer: Medicare Other | Admitting: Orthopedic Surgery

## 2017-02-14 ENCOUNTER — Encounter (INDEPENDENT_AMBULATORY_CARE_PROVIDER_SITE_OTHER): Payer: Self-pay | Admitting: Orthopedic Surgery

## 2017-02-14 DIAGNOSIS — M5440 Lumbago with sciatica, unspecified side: Secondary | ICD-10-CM | POA: Diagnosis not present

## 2017-02-14 NOTE — Progress Notes (Signed)
Office Visit Note   Patient: Brenda [name]           Date of Birth: February 27, 1956           MRN: [id number] Visit Date: 02/14/2017 Requested by: [name], MD [place], [place] [id number] PCP: [name], MD  Subjective: Chief Complaint  Patient presents with  . Lower Back - Follow-up    HPI: Brenda [name] is a 61 year old female with low back pain.  She had epidural steroid injection May 1 of May 23.  The pain has since transferred from the right side to the left side.  She did have on the right side at L5-S1 disc protrusion and small right-sided foraminal protrusion of L4.  She hasn't done much of the year of her body pump class that she likes to do.  Standing exercise and worse.  She starting formal physical therapy June 13 at the [facility].  She did get good relief from her right-sided symptoms with the second shot.  But now her symptoms of onto the left side.              ROS: All systems reviewed are negative as they relate to the chief complaint within the history of present illness.  Patient denies  fevers or chills.   Assessment & Plan: Visit Diagnoses:  1. Bilateral low back pain with sciatica, sciatica laterality unspecified, unspecified chronicity     Plan: Impression is low back pain with left-sided radiculopathy.  She was taking Ultram but they gave her stomach ache.  She is very active and doesn't have much in the way of nerve root tension signs today.  I think she do well to another injection.  We'll send her to Dr. Ernestina Patches for consideration of epidural steroid injection in the back to try to address left-sided symptoms.  I'll see her back as needed  Follow-Up Instructions: No Follow-up on file.   Orders:  Orders Placed This Encounter  Procedures  . Ambulatory referral to Physical Medicine Rehab   No orders of the defined types were placed in this encounter.     Procedures: No procedures performed   Clinical Data: No additional  findings.  Objective: Vital Signs: There were no vitals taken for this visit.  Physical Exam:   Constitutional: Patient appears well-developed HEENT:  Head: Normocephalic Eyes:EOM are normal Neck: Normal range of motion Cardiovascular: Normal rate Pulmonary/chest: Effort normal Neurologic: Patient is alert Skin: Skin is warm Psychiatric: Patient has normal mood and affect    Ortho Exam: Orthopedic exam demonstrates full active and passive range of motion with palpable pedal pulses.  No groin pain with internal/external rotation the leg and no nerve retention signs.  No definite paresthesias L1 S1 bilaterally.  Does have a little bit of pain with standing in extension.  Specialty Comments:  No specialty comments available.  Imaging: No results found.   PMFS History: Patient Active Problem List   Diagnosis Date Noted  . Allergic rhinitis 02/25/2016  . Low back pain 09/29/2015  . Carpal tunnel syndrome, right 08/20/2015  . Hx of completed stroke 05/21/2015  . Memory impairment 05/21/2015  . Hyperthyroidism 12/29/2008  . HYPERCHOLESTEROLEMIA 12/29/2008  . DEPRESSION 12/29/2008  . Essential hypertension 12/29/2008  . Coronary atherosclerosis 12/29/2008  . CAROTID ARTERY STENOSIS 12/29/2008   Past Medical History:  Diagnosis Date  . Cancer [facility])    breast cancer  . Carotid artery stenosis   . CHF (congestive heart failure) ([place])   .  Coronary artery disease   . Depression   . Hypercholesteremia   . Hypertension   . Hyperthyroidism   . Stroke [facility])     Family History  Problem Relation Age of Onset  . Lung cancer Mother        died at an early age  . Cancer Mother        lung  . Other Unknown        There is no Hx of premature coronary artery disease in her family    Past Surgical History:  Procedure Laterality Date  . ABDOMINAL HYSTERECTOMY    . BREAST LUMPECTOMY  1999  . BREAST LUMPECTOMY     left breast  . CARDIAC SURGERY    . CAROTID ENDARTERECTOMY   04-2000   left  . CORONARY ARTERY BYPASS GRAFT  2001   x 5 (left internal mammary artery to left anterior  descending coronary artery   Social History   Occupational History  . Social worker Unemployed   Social History Main Topics  . Smoking status: Former Smoker    Types: Cigarettes    Quit date: 09/11/2001  . Smokeless tobacco: Never Used  . Alcohol use No  . Drug use: No  . Sexual activity: Not on file

## 2017-02-19 ENCOUNTER — Encounter (INDEPENDENT_AMBULATORY_CARE_PROVIDER_SITE_OTHER): Payer: Medicare Other | Admitting: Physical Medicine and Rehabilitation

## 2017-02-27 ENCOUNTER — Encounter (INDEPENDENT_AMBULATORY_CARE_PROVIDER_SITE_OTHER): Payer: Self-pay | Admitting: Physical Medicine and Rehabilitation

## 2017-02-27 ENCOUNTER — Ambulatory Visit (INDEPENDENT_AMBULATORY_CARE_PROVIDER_SITE_OTHER): Payer: Medicare Other | Admitting: Physical Medicine and Rehabilitation

## 2017-02-27 ENCOUNTER — Ambulatory Visit (INDEPENDENT_AMBULATORY_CARE_PROVIDER_SITE_OTHER): Payer: Self-pay

## 2017-02-27 VITALS — BP 123/66 | HR 74

## 2017-02-27 DIAGNOSIS — M5416 Radiculopathy, lumbar region: Secondary | ICD-10-CM

## 2017-02-27 MED ORDER — METHYLPREDNISOLONE ACETATE 80 MG/ML IJ SUSP
80.0000 mg | Freq: Once | INTRAMUSCULAR | Status: AC
  Administered 2017-02-27: 80 mg

## 2017-02-27 MED ORDER — LIDOCAINE HCL (PF) 1 % IJ SOLN
2.0000 mL | Freq: Once | INTRAMUSCULAR | Status: AC
  Administered 2017-02-27: 2 mL

## 2017-02-27 NOTE — Patient Instructions (Signed)

## 2017-02-27 NOTE — Progress Notes (Deleted)
Brenda [name] - 61 y.o. female MRN [id number]  Date of birth: [birth year]  Office Visit Note: Visit Date: 02/27/2017 PCP: [name], MD Referred by: [name], *  Subjective: Chief Complaint  Patient presents with  . Lower Back - Pain   HPI: HPI  ROS Otherwise per HPI.  Assessment & Plan: Visit Diagnoses:  1. Lumbar radiculopathy     Plan: No additional findings.   Meds & Orders: No orders of the defined types were placed in this encounter.  No orders of the defined types were placed in this encounter.   Follow-up: No Follow-up on file.   Procedures: No procedures performed  No notes on file   Clinical History: Lumbar Spine MRI 12/22/2016 L4-5: Disc bulging with a small right foraminal disc protrusion which contacts the exiting right L4 nerve without evidence of neural compression. Ligamentum flavum thickening and moderate facet arthrosis. Mild right neural foraminal stenosis. No spinal stenosis.  L5-S1: Disc bulging, broad right paracentral disc protrusion, and mild facet arthrosis result in moderate right lateral recess and mild right neural foraminal stenosis. Potential right S1 nerve root impingement. No spinal stenosis.  IMPRESSION: 1. L5-S1 disc protrusion with right lateral recess stenosis, potentially affecting the right S1 nerve root. Mild right foraminal stenosis. 2. Small right foraminal disc protrusion at L4-5 contacting the right L4 nerve.  She reports that she quit smoking about 15 years ago. Her smoking use included Cigarettes. She has never used smokeless tobacco. No results for input(s): HGBA1C, LABURIC in the last 8760 hours.  Objective:  VS:  HT:    WT:   BMI:     BP:123/66  HR:74bpm  TEMP: ( )  RESP:100 % Physical Exam  Ortho Exam Imaging: No results found.  Past Medical/Family/Surgical/Social History: Medications & Allergies reviewed per EMR Patient Active Problem List   Diagnosis Date Noted  . Allergic  rhinitis 02/25/2016  . Low back pain 09/29/2015  . Carpal tunnel syndrome, right 08/20/2015  . Hx of completed stroke 05/21/2015  . Memory impairment 05/21/2015  . Hyperthyroidism 12/29/2008  . HYPERCHOLESTEROLEMIA 12/29/2008  . DEPRESSION 12/29/2008  . Essential hypertension 12/29/2008  . Coronary atherosclerosis 12/29/2008  . CAROTID ARTERY STENOSIS 12/29/2008   Past Medical History:  Diagnosis Date  . Cancer [facility])    breast cancer  . Carotid artery stenosis   . CHF (congestive heart failure) ([place])   . Coronary artery disease   . Depression   . Hypercholesteremia   . Hypertension   . Hyperthyroidism   . Stroke [facility])    Family History  Problem Relation Age of Onset  . Lung cancer Mother        died at an early age  . Cancer Mother        lung  . Other Unknown        There is no Hx of premature coronary artery disease in her family   Past Surgical History:  Procedure Laterality Date  . ABDOMINAL HYSTERECTOMY    . BREAST LUMPECTOMY  1999  . BREAST LUMPECTOMY     left breast  . CARDIAC SURGERY    . CAROTID ENDARTERECTOMY  04-2000   left  . CORONARY ARTERY BYPASS GRAFT  2001   x 5 (left internal mammary artery to left anterior  descending coronary artery   Social History   Occupational History  . Social worker Unemployed   Social History Main Topics  . Smoking status: Former Smoker    Types: Cigarettes  Quit date: 09/11/2001  . Smokeless tobacco: Never Used  . Alcohol use No  . Drug use: No  . Sexual activity: Not on file

## 2017-02-27 NOTE — Progress Notes (Deleted)
Patient states pain on the right side of low back is better since last injection.Now pain is mainly left sided but says it "jumps around". Radiates down left leg to foot. Numbness in both feet at times.Pain gets worse as days goes on. Cramps in thigh at times. Has went to two sessions of PT.

## 2017-02-28 ENCOUNTER — Other Ambulatory Visit: Payer: Self-pay | Admitting: Cardiology

## 2017-02-28 ENCOUNTER — Encounter: Payer: Self-pay | Admitting: Family Medicine

## 2017-02-28 ENCOUNTER — Ambulatory Visit (INDEPENDENT_AMBULATORY_CARE_PROVIDER_SITE_OTHER): Payer: Medicare Other | Admitting: Family Medicine

## 2017-02-28 VITALS — BP 176/80 | HR 80 | Temp 99.0°F | Wt 169.0 lb

## 2017-02-28 DIAGNOSIS — I10 Essential (primary) hypertension: Secondary | ICD-10-CM | POA: Diagnosis not present

## 2017-02-28 DIAGNOSIS — L249 Irritant contact dermatitis, unspecified cause: Secondary | ICD-10-CM | POA: Diagnosis not present

## 2017-02-28 LAB — BASIC METABOLIC PANEL
BUN/Creatinine Ratio: 15 (ref 12–28)
BUN: 13 mg/dL (ref 8–27)
CO2: 22 mmol/L (ref 20–29)
Calcium: 10 mg/dL (ref 8.7–10.3)
Chloride: 102 mmol/L (ref 96–106)
Creatinine, Ser: 0.87 mg/dL (ref 0.57–1.00)
GFR calc Af Amer: 82 mL/min/{1.73_m2} (ref >59)
GFR calc non Af Amer: 71 mL/min/{1.73_m2} (ref >59)
Glucose: 117 mg/dL — ABNORMAL HIGH (ref 65–99)
Potassium: 4.2 mmol/L (ref 3.5–5.2)
Sodium: 141 mmol/L (ref 134–144)

## 2017-02-28 MED ORDER — HYDROCORTISONE 2.5 % EX CREA: TOPICAL_CREAM | Freq: Two times a day (BID) | CUTANEOUS | 0 refills | Status: DC

## 2017-02-28 NOTE — Patient Instructions (Signed)

## 2017-02-28 NOTE — Progress Notes (Signed)
Subjective:    Patient ID: Brenda [name], female    DOB: 07-May-1956, 61 y.o.   MRN: [id number]  HPI This is a 61 yo female who presents today with rash on left side of face and upper chest. Feels rough, mildly itchy, no drainage. Had been pulling some weeds in her yard. No new soaps/lotions/detergents. She has been getting cortisone shots in her back. Had injections 02/27/17.   HTN- Working with pharmacist to get BP under control. Did not have one of her medications last night. Is getting refilled today. Checks blood pressure at home and running 130-150s. Denies headache, chest pain, visual changes.   Past Medical History:  Diagnosis Date  . Cancer [facility])    breast cancer  . Carotid artery stenosis   . CHF (congestive heart failure) ([place])   . Coronary artery disease   . Depression   . Hypercholesteremia   . Hypertension   . Hyperthyroidism   . Stroke [facility])    Past Surgical History:  Procedure Laterality Date  . ABDOMINAL HYSTERECTOMY    . BREAST LUMPECTOMY  1999  . BREAST LUMPECTOMY     left breast  . CARDIAC SURGERY    . CAROTID ENDARTERECTOMY  04-2000   left  . CORONARY ARTERY BYPASS GRAFT  2001   x 5 (left internal mammary artery to left anterior  descending coronary artery   Family History  Problem Relation Age of Onset  . Lung cancer Mother        died at an early age  . Cancer Mother        lung  . Other Unknown        There is no Hx of premature coronary artery disease in her family   Social History  Substance Use Topics  . Smoking status: Former Smoker    Types: Cigarettes    Quit date: 09/11/2001  . Smokeless tobacco: Never Used  . Alcohol use No      Review of Systems Per HPI    Objective:   Physical Exam  Constitutional: She is oriented to person, place, and time. She appears well-developed and well-nourished. No distress.  HENT:  Head: Normocephalic and atraumatic.  Eyes: Conjunctivae are normal.  Neck: Normal range of motion. Neck supple.    Cardiovascular: Normal rate.   Pulmonary/Chest: Effort normal.  Lymphadenopathy:    She has no cervical adenopathy.  Neurological: She is alert and oriented to person, place, and time.  Skin: Skin is warm and dry. Rash (left jaw line and left side of neck with fine papules, no erythema, no vessicles. ) noted. She is not diaphoretic.  Vitals reviewed.     BP (!) 176/80 (BP Location: Right Arm, Patient Position: Sitting, Cuff Size: Normal)   Pulse 80   Temp 99 F (37.2 C) (Oral)   Wt 169 lb (76.7 kg)   SpO2 97%   BMI 28.12 kg/m  Wt Readings from Last 3 Encounters:  02/28/17 169 lb (76.7 kg)  09/19/16 166 lb (75.3 kg)  02/25/16 166 lb (75.3 kg)   Recheck BP 160/82  BP Readings from Last 3 Encounters:  03/01/17 (!) 158/66  02/28/17 (!) 176/80  02/27/17 123/66      Assessment & Plan:  1. Irritant contact dermatitis, unspecified trigger - Provided written and verbal information regarding diagnosis and treatment. - hydrocortisone 2.5 % cream; Apply topically 2 (two) times daily. Apply sparingly for maximum 7 days  Dispense: 28 g; Refill: 0  2. Essential hypertension -  follow up with pharmacist as scheduled   Clarene Reamer, FNP-BC  [facility] [place]  03/02/2017 9:33 PM

## 2017-03-01 ENCOUNTER — Ambulatory Visit (INDEPENDENT_AMBULATORY_CARE_PROVIDER_SITE_OTHER): Payer: Medicare Other | Admitting: Pharmacist

## 2017-03-01 VITALS — BP 158/66 | HR 68

## 2017-03-01 DIAGNOSIS — I10 Essential (primary) hypertension: Secondary | ICD-10-CM | POA: Diagnosis not present

## 2017-03-01 NOTE — Progress Notes (Signed)
Patient ID: Brenda [name]                 DOB: 1955/11/23                      MRN: [id number]     HPI: Brenda [name] is a 61 y.o. female referred by Dr. Stanford Breed to HTN clinic. PMH significant for breast cancer, CABG x 5, carotid endarterectomy in 2001 and hypertension.  She is dealing with an inflamed sciatic nerve.  Patient presents to clinic today for follow up. Denies dizziness, headaches, fatigue, chest pain or ADR to medication.  Reports getting a third injection for sciatic nerve pain 2 days ago with mayor improvement on her pain.  Patient ran out of spironolactone and had a gap in therapy for 2-3 days , now back on therapy since yesterday.  Current HTN meds: Lisinopril/hctz 40/50 mg every morning Amlodipine 10 mg every evening Metoprolol succ 100 mg every evening Spironolactone 25 mg twice daily  BP goal: <130/80  Family History: Mother died from cancer when patient was 28, lived in foster care and with older aunts/uncles. Family history unknown.  Social History: Quit smoking and alcohol about 20 years ago; drinks 1 cup of coffee most days  Diet:Eats mostly healthy, home cooked foods; enjoys vegetables and salmon, admits to occasionally "cheating" with chips - probably more so in the past 2-3 weeks;   Exercise:Several days per week, enjoys yoga, cycle (cycling on hold until after injection)  Home BP readings:15 readings; average 146/70 (pulse 61-84)  Wt Readings from Last 3 Encounters:  02/28/17 169 lb (76.7 kg)  09/19/16 166 lb (75.3 kg)  02/25/16 166 lb (75.3 kg)   BP Readings from Last 3 Encounters:  03/01/17 (!) 158/66  02/28/17 (!) 176/80  02/27/17 123/66   Pulse Readings from Last 3 Encounters:  03/01/17 68  02/28/17 80  02/27/17 74    Renal function: Estimated Creatinine Clearance: 67.8 mL/min (by C-G formula based on SCr of 0.87 mg/dL).  Past Medical History:  Diagnosis Date  . Cancer [facility])    breast cancer  . Carotid artery stenosis   . CHF  (congestive heart failure) ([place])   . Coronary artery disease   . Depression   . Hypercholesteremia   . Hypertension   . Hyperthyroidism   . Stroke [facility])     Current Outpatient Prescriptions on File Prior to Visit  Medication Sig Dispense Refill  . amLODipine (NORVASC) 10 MG tablet Take 1 tablet (10 mg total) by mouth daily. 30 tablet 5  . aspirin 81 MG tablet Take 81 mg by mouth daily.      . Cholecalciferol (VITAMIN D) 1000 UNITS capsule Take 2,000 Units by mouth daily.     Marland Kitchen gabapentin (NEURONTIN) 300 MG capsule Take 1 capsule by mouth at night for 7 nights and then 1 in the morning and one at night for 7 days and then 3 times a day. 90 capsule 0  . hydrocortisone 2.5 % cream Apply topically 2 (two) times daily. Apply sparingly for maximum 7 days 28 g 0  . lisinopril-hydrochlorothiazide (PRINZIDE,ZESTORETIC) 20-25 MG tablet Take 2 tablets by mouth daily. 180 tablet 1  . metoprolol succinate (TOPROL-XL) 100 MG 24 hr tablet TAKE 1 TABLET BY MOUTH ONCE DAILY WITH  OR  IMMEDIATELY  FOLLOWING  A  MEAL 90 tablet 3  . Multiple Vitamin (MULTIVITAMIN) tablet Take 1 tablet by mouth daily.      . naproxen (NAPROSYN) 500  MG tablet Take 1 tablet (500 mg total) by mouth 2 (two) times daily with a meal. 20 tablet 0  . nitroGLYCERIN (NITROSTAT) 0.4 MG SL tablet Place 1 tablet (0.4 mg total) under the tongue every 5 (five) minutes as needed for chest pain (x 3 tabs daily). 20 tablet 1  . rosuvastatin (CRESTOR) 40 MG tablet Take 1 tablet (40 mg total) by mouth daily. 90 tablet 3  . spironolactone (ALDACTONE) 25 MG tablet Take 2 tablets (50 mg total) by mouth daily. 60 tablet 3  . traMADol (ULTRAM) 50 MG tablet Take 1 tablet (50 mg total) by mouth every 8 (eight) hours as needed for moderate pain or severe pain. 40 tablet 0   No current facility-administered medications on file prior to visit.     Allergies  Allergen Reactions  . Latex     unknown    Blood pressure (!) 158/66, pulse 68, SpO2 98  %.  Essential hypertension: Blood pressure remains above goal but continued to improve. Patient walked into clinic drinking coffee what partially contributes to elevated BP reading today. Home BP average improved from 169/75 on 02/01/17  to 146/70 today. Therapy compliance continued to represent a problems with recent gap in spironolactone therapy after a late medication refill. Patient also expressed difficulty with new charges every time she come for BP follow up. Will continue current therapy without changes and continue home BP monitoring. Patient to email BP log in 2 weeks to me for assessment. Plan to f/u in office in 8 weeks.   Raquel Rodriguez-Guzman PharmD, [facility] [place] [address] 03/01/2017 8:21 AM

## 2017-03-01 NOTE — Patient Instructions (Addendum)
Return for a  follow up appointment in 8 weeks (call to schedule)  Your blood pressure today is 158/66 pulse 68  Check your blood pressure at home daily (if able) and keep record of the readings.  Take your BP meds as follows: **ALL medication as prescribed**   Bring your record of home blood pressures to your next appointment.  Exercise as you're able, try to walk approximately 30 minutes per day.  Keep salt intake to a minimum, especially watch canned and prepared boxed foods.  Eat more fresh fruits and vegetables and fewer canned items.  Avoid eating in fast food restaurants.    HOW TO TAKE YOUR BLOOD PRESSURE: . Rest 5 minutes before taking your blood pressure. .  Don't smoke or drink caffeinated beverages for at least 30 minutes before. . Take your blood pressure before (not after) you eat. . Sit comfortably with your back supported and both feet on the floor (don't cross your legs). . Elevate your arm to heart level on a table or a desk. . Use the proper sized cuff. It should fit smoothly and snugly around your bare upper arm. There should be enough room to slip a fingertip under the cuff. The bottom edge of the cuff should be 1 inch above the crease of the elbow. . Ideally, take 3 measurements at one sitting and record the average.

## 2017-03-02 NOTE — Procedures (Signed)
Lumbosacral Transforaminal Epidural Steroid Injection - Infraneural Approach with Fluoroscopic Guidance  Patient: Brenda [name]      Date of Birth: 1956-07-04 MRN: [id number] PCP: [name], MD      Visit Date: 02/27/2017   Brenda [name] is a 61 year old female that we completed 2 epidural injections for her right radicular pain and low back pain. MRI evidence showed right foraminal protrusion at L4-5 as well as broad herniation at L5-S1 more to the right. Her symptoms are more classic S1 distribution symptoms on the right and those a bit relief. Because she started having symptoms on the left side when she called and we had her follow up with Dr. Marlou Sa her orthopedic surgeon. He requests a diagnostic injection on the left since she had done well on the right. MRI from April of this year does not show any left-sided lesions but she is having pretty classic L5 distribution symptoms. We will go ahead today with diagnostic L5 injection and see how she does she is currently in physical therapy will continue with that. If her left-sided complaints were more severe and continues to progress she may need updated of her MRI given the findings before really not consistent with left radicular pathology. On exam she has no weakness.  Universal Protocol:     Consent Given By: the patient  Position: PRONE   Additional Comments: Vital signs were monitored before and after the procedure. Patient was prepped and draped in the usual sterile fashion. The correct patient, procedure, and site was verified.   Injection Procedure Details:  Procedure Site One Meds Administered:  Meds ordered this encounter  Medications  . lidocaine (PF) (XYLOCAINE) 1 % injection 2 mL  . methylPREDNISolone acetate (DEPO-MEDROL) injection 80 mg      Laterality: Left  Location/Site:  L5-S1  Needle size: 22 G  Needle type: Spinal  Needle Placement: Transforaminal  Findings:  -Contrast Used: 1 mL iohexol 180 mg  iodine/mL   -Comments: Excellent flow of contrast along the nerve and into the epidural space.  Procedure Details: After squaring off the end-plates of the desired vertebral level to get a true AP view, the C-arm was obliqued to the painful side so that the superior articulating process is positioned about 1/3 the length of the inferior endplate.  The needle was aimed toward the junction of the superior articular process and the transverse process of the inferior vertebrae. The needle's initial entry is in the lower third of the foramen through Kambin's triangle. The soft tissues overlying this target were infiltrated with 2-3 ml. of 1% Lidocaine without Epinephrine.  The spinal needle was then inserted and advanced toward the target using a "trajectory" view along the fluoroscope beam.  Under AP and lateral visualization, the needle was advanced so it did not puncture dura and did not traverse medially beyond the 6 o'clock position of the pedicle. Bi-planar projections were used to confirm position. Aspiration was confirmed to be negative for CSF and/or blood. A 1-2 ml. volume of Isovue-250 was injected and flow of contrast was noted at each level. Radiographs were obtained for documentation purposes.   After attaining the desired flow of contrast documented above, a 0.5 to 1.0 ml test dose of 0.25% Marcaine was injected into each respective transforaminal space.  The patient was observed for 90 seconds post injection.  After no sensory deficits were reported, and normal lower extremity motor function was noted,   the above injectate was administered so that equal amounts of the  injectate were placed at each foramen (level) into the transforaminal epidural space.   Additional Comments:  The patient tolerated the procedure well Dressing: Band-Aid    Post-procedure details: Patient was observed during the procedure. Post-procedure instructions were reviewed.  Patient left the clinic in stable  condition.

## 2017-03-05 ENCOUNTER — Telehealth (INDEPENDENT_AMBULATORY_CARE_PROVIDER_SITE_OTHER): Payer: Self-pay | Admitting: Physical Medicine and Rehabilitation

## 2017-03-05 DIAGNOSIS — M5416 Radiculopathy, lumbar region: Secondary | ICD-10-CM

## 2017-03-05 NOTE — Telephone Encounter (Signed)
Called patient to advise. She is going to call to let us know which option she would like to try first. She has not been taking the Gabapentin. I advised her to try that as directed and to slowly work up to three times a day.

## 2017-03-05 NOTE — Telephone Encounter (Signed)
Patient wishes to proceed with new MRI.

## 2017-03-05 NOTE — Telephone Encounter (Signed)
The most current lumbar MRI does not give a reason for the left leg pain, she can either 1) have repeat lumbar spine MRI to see if anything has changed since prior exam 2) eval with spine surgeon/neurosurgeon  3) have neurologist perform nerve tests on both legs look for nerve damage that could help distinguish if source is spine.   Continue PT. Continue gababpentin as directed she should try to increase up to three times per day slowly.

## 2017-03-06 NOTE — Addendum Note (Signed)
Addended by: Raymondo Band on: 03/06/2017 06:40 AM   Modules accepted: Orders

## 2017-03-06 NOTE — Telephone Encounter (Signed)
Changed to [facility].

## 2017-03-06 NOTE — Telephone Encounter (Signed)
Order for MRI placed in chart please change to proper queue

## 2017-03-18 ENCOUNTER — Ambulatory Visit
Admission: RE | Admit: 2017-03-18 | Discharge: 2017-03-18 | Disposition: A | Discharge status: Home / Self Care | Payer: Medicare Other | Source: Ambulatory Visit | Attending: Physical Medicine and Rehabilitation | Admitting: Physical Medicine and Rehabilitation

## 2017-03-18 DIAGNOSIS — M5416 Radiculopathy, lumbar region: Secondary | ICD-10-CM

## 2017-03-28 ENCOUNTER — Encounter (INDEPENDENT_AMBULATORY_CARE_PROVIDER_SITE_OTHER): Payer: Self-pay | Admitting: Physical Medicine and Rehabilitation

## 2017-03-28 ENCOUNTER — Ambulatory Visit (INDEPENDENT_AMBULATORY_CARE_PROVIDER_SITE_OTHER): Payer: Medicare Other | Admitting: Physical Medicine and Rehabilitation

## 2017-03-28 VITALS — BP 150/73 | HR 61

## 2017-03-28 DIAGNOSIS — M5116 Intervertebral disc disorders with radiculopathy, lumbar region: Secondary | ICD-10-CM

## 2017-03-28 DIAGNOSIS — R202 Paresthesia of skin: Secondary | ICD-10-CM | POA: Diagnosis not present

## 2017-03-28 DIAGNOSIS — M5416 Radiculopathy, lumbar region: Secondary | ICD-10-CM | POA: Diagnosis not present

## 2017-03-28 DIAGNOSIS — M4316 Spondylolisthesis, lumbar region: Secondary | ICD-10-CM | POA: Diagnosis not present

## 2017-03-28 NOTE — Progress Notes (Deleted)
Here for MRI review. Still having some left leg pain. Going to PT and reports she is getting a little stronger. Is able to ride stationary bike for an hour now. Yoga class this morning. Having some discomfort in left buttock. Able to walk longer. Still has some tingling sometimes, but not as bad. Feels she is improving. Did not take Gabapentin. Taking ibuprofen and alternating ice and heat.

## 2017-03-29 ENCOUNTER — Encounter (INDEPENDENT_AMBULATORY_CARE_PROVIDER_SITE_OTHER): Payer: Self-pay | Admitting: Physical Medicine and Rehabilitation

## 2017-03-29 NOTE — Progress Notes (Signed)
Brenda [name] - 61 y.o. female MRN [id number]  Date of birth: 23-Jun-1956  Office Visit Note: Visit Date: 03/28/2017 PCP: [name], MD Referred by: [name], *  Subjective: Chief Complaint  Patient presents with  . Lower Back - Pain   HPI: [name] is a pleasant and active 61 year old female who we've seen now for a few months with chronic axial back pain with radicular type pain that just didn't respond as well as we hope with interventional procedure. She comes in today for review of updated MRI of the lumbar spine. This was completed recently and reviewed below. There is essentially no change from the prior MRI. We did obtain that because she was having such severe pain that was not relieved with interventional procedures or medication. She unfortunately has been going to physical therapy with Barbaraann Barthel and she has been increasing activity very slowly. She is able to ride the stationary bike for about an hour now which is very good for her. She's been doing yoga again and they've helped her to modify some of the positions. She is still having some discomfort in the left buttock and some in the left leg and more of an L5 distribution but is much better than it was before. She is able to walk longer but still has some tingling symptoms but not as bad. She feels that she has improved. She did not take the gabapentin that we prescribed for her. She is taking ibuprofen and alternating heat and ice. She's had no new focal weakness or trauma. She's had no unexplained weight loss or fevers chills or night sweats.    Review of Systems  Constitutional: Negative for chills, fever, malaise/fatigue and weight loss.  HENT: Negative for hearing loss and sinus pain.   Eyes: Negative for blurred vision, double vision and photophobia.  Respiratory: Negative for cough and shortness of breath.   Cardiovascular: Negative for chest pain, palpitations and leg swelling.    Gastrointestinal: Negative for abdominal pain, nausea and vomiting.  Genitourinary: Negative for flank pain.  Musculoskeletal: Positive for back pain. Negative for myalgias.       Leg pain  Skin: Negative for itching and rash.  Neurological: Positive for tingling. Negative for tremors, focal weakness and weakness.  Endo/Heme/Allergies: Negative.   Psychiatric/Behavioral: Negative for depression.  All other systems reviewed and are negative.  Otherwise per HPI.  Assessment & Plan: Visit Diagnoses:  1. Radiculopathy, lumbar region   2. Intervertebral disc disorders with radiculopathy, lumbar region   3. Paresthesia of skin   4. Spondylolisthesis of lumbar region     Plan: Findings:  Chronic history of low back and radicular leg pain left more than right and persistent left leg pain with paresthesia and what seems to be more of an L5 distribution but possibly L4. We updated MRI of the lumbar spine because she was just in the higher straits in the amount of pain and dysfunction she was having. A lot of this was just frustration that she could do a lot of working out that she's been so established down for quite some time. I think with conservative care to Dr. Belia Heman as well as reassuring her with close follow-up she is actually done well with increasing her activity and modifying her activity overall she is doing better. MRI was reviewed with her today and the pictures were shown to her. We spent over 25 minutes looking at the pictures and discussing her care. If she really hasn't  unchanged MRI from before. This shows moderate facet arthropathy at L4-5 with slight minimal listhesis and foraminal protrusion on the right and also foraminal protrusion on the left in the lateral aspect of the foramen. These protrusions are very small and there is no compression. There is more of a right paracentral broader disc protrusion at L5-S1 and is more right sided but again could irritate the left side  depending on movement position. Again there is no focal stenosis no focal compression. I think at this point watchful waiting and continuing with conservative care is the best approach. Injections were just not as beneficial because really the patient's having severe pain she just has pain that is recalcitrant to overdoing at this point. If the pain were to declare itself and become worse than obviously she could look at redoing the injections versus something like a spinal cord stimulator trial versus surgical evaluation. She and I both agree that surgery is not something that she should be looking at any time soon.    Meds & Orders: No orders of the defined types were placed in this encounter.  No orders of the defined types were placed in this encounter.   Follow-up: Return if symptoms worsen or fail to improve.   Procedures: No procedures performed  No notes on file   Clinical History: MRI LUMBAR SPINE WITHOUT CONTRAST  TECHNIQUE: Multiplanar, multisequence MR imaging of the lumbar spine was performed. No intravenous contrast was administered.  COMPARISON: 12/22/2016  FINDINGS: Segmentation: Standard.  Alignment: Slight facet mediated anterolisthesis at L4-5  Vertebrae: Mild degenerative STIR hyperintensity about the L5-S1 endplates, stable. Chronic L3 superior endplate and L2 inferior endplate Schmorl's nodes. No acute fracture, discitis, or aggressive bone lesion.  Conus medullaris: Extends to the L2 level and appears normal.  Paraspinal and other soft tissues: Negative  Disc levels:  T12- L1: Unremarkable.  L1-L2: Unremarkable.  L2-L3: Disc narrowing and desiccation with mild bulging. Negative facets. No impingement  L3-L4: Disc narrowing and desiccation with mild bulging. Negative facets. No impingement  L4-L5: Facet arthropathy with slight anterolisthesis. Chronic right foraminal disc protrusion contacting the L4 nerve root  without compression.  L5-S1:Moderate degenerative disc narrowing with bulging and superimposed bilateral paracentral disc protrusions, contacting the S1 nerve root on the right. Patent foramina. Mild facet spurring  IMPRESSION: 1. Stable compared to 12/22/2016. 2. Moderate disc degeneration from L2-3 to L5-S1. Facet arthropathy notable at L4-5 where there is slight anterolisthesis. 3. L4-5 chronic small right foraminal disc protrusion contacting the L4 nerve root. 4. L5-S1 chronic small right paracentral disc protrusion contacting the right S1 nerve root.   Electronically Signed By: Monte Fantasia M.D. On: 03/18/2017 09:34  Lumbar Spine MRI 12/22/2016 L4-5: Disc bulging with a small right foraminal disc protrusion which contacts the exiting right L4 nerve without evidence of neural compression. Ligamentum flavum thickening and moderate facet arthrosis. Mild right neural foraminal stenosis. No spinal stenosis.  L5-S1: Disc bulging, broad right paracentral disc protrusion, and mild facet arthrosis result in moderate right lateral recess and mild right neural foraminal stenosis. Potential right S1 nerve root impingement. No spinal stenosis.  IMPRESSION: 1. L5-S1 disc protrusion with right lateral recess stenosis, potentially affecting the right S1 nerve root. Mild right foraminal stenosis. 2. Small right foraminal disc protrusion at L4-5 contacting the right L4 nerve.  She reports that she quit smoking about 15 years ago. Her smoking use included Cigarettes. She has never used smokeless tobacco. No results for input(s): HGBA1C, LABURIC in the last 8760  hours.  Objective:  VS:  HT:    WT:   BMI:     BP:(!) 150/73  HR:61bpm  TEMP: ( )  RESP:  Physical Exam  Constitutional: She is oriented to person, place, and time. She appears well-developed and well-nourished.  Eyes: Pupils are equal, round, and reactive to light. Conjunctivae and EOM are normal.  Cardiovascular:  Normal rate and intact distal pulses.   Pulmonary/Chest: Effort normal.  Musculoskeletal:  The patient ambulates without aid with a normal gait.  They have no pain with hip internal or external rotation.  There is concordant pain with extension rotation of the lumbar spine. She has good distal strength  Neurological: She is alert and oriented to person, place, and time. She exhibits normal muscle tone.  Skin: Skin is warm and dry. No rash noted. No erythema.  Psychiatric: She has a normal mood and affect. Her behavior is normal.  Nursing note and vitals reviewed.   Ortho Exam Imaging: No results found.  Past Medical/Family/Surgical/Social History: Medications & Allergies reviewed per EMR Patient Active Problem List   Diagnosis Date Noted  . Allergic rhinitis 02/25/2016  . Low back pain 09/29/2015  . Carpal tunnel syndrome, right 08/20/2015  . Hx of completed stroke 05/21/2015  . Memory impairment 05/21/2015  . Hyperthyroidism 12/29/2008  . HYPERCHOLESTEROLEMIA 12/29/2008  . DEPRESSION 12/29/2008  . Essential hypertension 12/29/2008  . Coronary atherosclerosis 12/29/2008  . CAROTID ARTERY STENOSIS 12/29/2008   Past Medical History:  Diagnosis Date  . Cancer [facility])    breast cancer  . Carotid artery stenosis   . CHF (congestive heart failure) ([place])   . Coronary artery disease   . Depression   . Hypercholesteremia   . Hypertension   . Hyperthyroidism   . Stroke [facility])    Family History  Problem Relation Age of Onset  . Lung cancer Mother        died at an early age  . Cancer Mother        lung  . Other Unknown        There is no Hx of premature coronary artery disease in her family   Past Surgical History:  Procedure Laterality Date  . ABDOMINAL HYSTERECTOMY    . BREAST LUMPECTOMY  1999  . BREAST LUMPECTOMY     left breast  . CARDIAC SURGERY    . CAROTID ENDARTERECTOMY  04-2000   left  . CORONARY ARTERY BYPASS GRAFT  2001   x 5 (left internal mammary artery to  left anterior  descending coronary artery   Social History   Occupational History  . Social worker Unemployed   Social History Main Topics  . Smoking status: Former Smoker    Types: Cigarettes    Quit date: 09/11/2001  . Smokeless tobacco: Never Used  . Alcohol use No  . Drug use: No  . Sexual activity: Not on file

## 2017-04-02 ENCOUNTER — Telehealth (INDEPENDENT_AMBULATORY_CARE_PROVIDER_SITE_OTHER): Payer: Self-pay

## 2017-04-02 DIAGNOSIS — R202 Paresthesia of skin: Secondary | ICD-10-CM

## 2017-04-02 NOTE — Telephone Encounter (Signed)
Called pt and advised that referring doctor office should be calling her to get set up

## 2017-04-02 NOTE — Telephone Encounter (Signed)
I referred pt Brenda [name] at [facility] for emg/ncs to look for nerve damage. Make sure referral takes place.

## 2017-04-02 NOTE — Telephone Encounter (Signed)
Pt left a very tearful message on Friday afternoon stating that she is having increased left foot and toe numbness. Waking her up with them "freezing up." Wants to know what she should do?

## 2017-04-02 NOTE — Addendum Note (Signed)
Addended by: Raymondo Band on: 04/02/2017 10:08 AM   Modules accepted: Orders

## 2017-04-04 ENCOUNTER — Encounter: Payer: Self-pay | Admitting: Neurology

## 2017-04-04 ENCOUNTER — Other Ambulatory Visit: Payer: Self-pay | Admitting: *Deleted

## 2017-04-04 DIAGNOSIS — R202 Paresthesia of skin: Secondary | ICD-10-CM

## 2017-04-12 ENCOUNTER — Ambulatory Visit (INDEPENDENT_AMBULATORY_CARE_PROVIDER_SITE_OTHER): Payer: Medicare Other | Admitting: Neurology

## 2017-04-12 DIAGNOSIS — R202 Paresthesia of skin: Secondary | ICD-10-CM

## 2017-04-12 NOTE — Procedures (Signed)
[facility]  [place], [place]  [place], [place] [address] Tel: [phone] Fax:  [phone] Test Date:  04/12/2017  Patient: Brenda [name] DOB: 02-27-1956 Physician: Narda Amber, DO  Sex: Female Height: 5\' 5"  Ref Phys: Magnus Sinning, M.D.  ID#: [id number] Temp: 34.0C Technician:    Patient Complaints: This is a 61 year-old female referred for evaluation of left leg pain and foot paresthesias.  NCV & EMG Findings: Extensive electrodiagnostic testing of the left lower extremity shows:  1. Left sural and superficial peroneal sensory responses are within normal limits. 2. Left peroneal and tibial motor responses are within normal limits. 3. Left tibial H reflex study is within normal limits. 4. There is no evidence of active or chronic motor axon loss changes affecting any of the tested muscles. Motor unit configuration and recruitment pattern is within normal limits.  Impression: This is a normal study of the left lower extremity. In particular, there is no evidence of a generalized sensorimotor polyneuropathy or lumbosacral radiculopathy.    ___________________________ Narda Amber, DO    Nerve Conduction Studies Anti Sensory Summary Table   Site NR Peak (ms) Norm Peak (ms) P-T Amp (V) Norm P-T Amp  Left Sup Peroneal Anti Sensory (Ant Lat Mall)  12 cm    3.3 <4.6 8.0 >3  Left Sural Anti Sensory (Lat Mall)  Calf    4.2 <4.6 9.2 >3   Motor Summary Table   Site NR Onset (ms) Norm Onset (ms) O-P Amp (mV) Norm O-P Amp Site1 Site2 Delta-0 (ms) Dist (cm) Vel (m/s) Norm Vel (m/s)  Left Peroneal Motor (Ext Dig Brev)  Ankle    3.8 <6.0 5.4 >2.5 B Fib Ankle 7.8 33.0 42 >40  B Fib    11.6  4.1  Poplt B Fib 1.3 8.0 62 >40  Poplt    12.9  4.0         Left Tibial Motor (Abd Hall Brev)  Ankle    4.1 <6.0 10.7 >4 Knee Ankle 8.4 38.0 45 >40  Knee    12.5  8.9          H Reflex Studies   NR H-Lat (ms) Lat Norm (ms) L-R H-Lat (ms)  Left Tibial (Gastroc)   31.97 <35    EMG   Side Muscle Ins Act Fibs Psw Fasc Number Recrt Dur Dur. Amp Amp. Poly Poly. Comment  Left AntTibialis Nml Nml Nml Nml Nml Nml Nml Nml Nml Nml Nml Nml N/A  Left Gastroc Nml Nml Nml Nml Nml Nml Nml Nml Nml Nml Nml Nml N/A  Left Flex Dig Long Nml Nml Nml Nml Nml Nml Nml Nml Nml Nml Nml Nml N/A  Left RectFemoris Nml Nml Nml Nml Nml Nml Nml Nml Nml Nml Nml Nml N/A  Left GluteusMed Nml Nml Nml Nml Nml Nml Nml Nml Nml Nml Nml Nml N/A  Left BicepsFemS Nml Nml Nml Nml Nml Nml Nml Nml Nml Nml Nml Nml N/A      Waveforms:

## 2017-04-19 ENCOUNTER — Ambulatory Visit (INDEPENDENT_AMBULATORY_CARE_PROVIDER_SITE_OTHER): Payer: Medicare Other | Admitting: Physical Medicine and Rehabilitation

## 2017-04-19 ENCOUNTER — Encounter (INDEPENDENT_AMBULATORY_CARE_PROVIDER_SITE_OTHER): Payer: Self-pay | Admitting: Physical Medicine and Rehabilitation

## 2017-04-19 VITALS — BP 113/66 | HR 76

## 2017-04-19 DIAGNOSIS — R202 Paresthesia of skin: Secondary | ICD-10-CM

## 2017-04-19 DIAGNOSIS — M5416 Radiculopathy, lumbar region: Secondary | ICD-10-CM

## 2017-04-19 DIAGNOSIS — M5116 Intervertebral disc disorders with radiculopathy, lumbar region: Secondary | ICD-10-CM | POA: Diagnosis not present

## 2017-04-19 NOTE — Progress Notes (Deleted)
Pain mainly left thigh and down to calf. States calves on both legs feel tight and worse on left. Going for deep massage twice a week.Pain gets worse as the day goes on. Left foot goes numb with laying down and sometimes when she is walking.  neurosurgeon

## 2017-04-24 ENCOUNTER — Encounter (INDEPENDENT_AMBULATORY_CARE_PROVIDER_SITE_OTHER): Payer: Self-pay | Admitting: Physical Medicine and Rehabilitation

## 2017-04-24 NOTE — Progress Notes (Signed)
Brenda [name] - 61 y.o. female MRN [id number]  Date of birth: [birth year]  Office Visit Note: Visit Date: 04/19/2017 PCP: [name], MD Referred by: [name], *  Subjective: Chief Complaint  Patient presents with  . Lower Back - Pain   HPI: Brenda [name] is a very pleasant and active 61 year old female frustrated with her history now of chronic low back pain and radicular-type pain consistent more of a stenosis type picture or lateral recess disc herniation picture from a clinical standpoint. We have completed an epidural injection at L5 on the left without much relief. We have completed facet joint block without much relief. She has actively modified a lot of her activity but she continues to still be active which we want her to do. She is in physical therapy actively as well as doing yoga. Since the last time I have seen her she has had electrodiagnostic study of the left lower limb by Dr. Posey Pronto. This was normal and did not show any evidence of focal nerve compression or lumbar radiculopathy. Obviously this does not rule out a sensory only demyelinating radiculopathy or chemical radiculitis. Medications at this point it did not help. I discussed with her at great length today for over 30 minutes on counseling for medications as well as ongoing treatment. She reports her symptoms down the leg and at least today may be declaring themselves more of an S1 pattern to the side and bottom of the foot. In the past they have just gone down the left lateral calf. Prior MRI evidence of degenerative disc narrowing and paracentral disc protrusion at L5-S1 more right sided pathology per report and when you look at the images there is a broad numbness to the protrusion.    Review of Systems  Constitutional: Negative for chills, fever, malaise/fatigue and weight loss.  HENT: Negative for hearing loss and sinus pain.   Eyes: Negative for blurred vision, double vision and photophobia.    Respiratory: Negative for cough and shortness of breath.   Cardiovascular: Negative for chest pain, palpitations and leg swelling.  Gastrointestinal: Negative for abdominal pain, nausea and vomiting.  Genitourinary: Negative for flank pain.  Musculoskeletal: Positive for back pain. Negative for myalgias.       Leg pain  Skin: Negative for itching and rash.  Neurological: Positive for tingling. Negative for tremors, focal weakness and weakness.  Endo/Heme/Allergies: Negative.   Psychiatric/Behavioral: Negative for depression.  All other systems reviewed and are negative.  Otherwise per HPI.  Assessment & Plan: Visit Diagnoses:  1. Lumbar radiculopathy   2. Radiculopathy due to lumbar intervertebral disc disorder   3. Paresthesia of skin     Plan: Findings:  Chronic several months now history of low back in her dominantly left radicular leg pain in what seemed to be more of a classic L5 distribution but today is somewhat of an S1 distribution. MRI evidence has shown facet arthropathy and slight listhesis of L4 on L5 with right-sided foraminal disc protrusion but smaller left-sided is present as well. At L5-S1 there is degenerative disc narrowing foraminal narrowing as well as superimposed paracentral disc protrusion which is really bilateral. I think given the fact that she has had a negative electrodiagnostic study and has not done well with medications and is really frustrated at this point with her pain it would be wise to complete one more injection diagnostically which would be an S1 transforaminal injections to see if that gives her relief. If this doesn't give her  much relief we would refer her to a neurosurgeon for just evaluation purposes and potentially looking at this is a surgical issue. Otherwise without increase her gabapentin slowly and see if she can tolerate that. We had a long discussion on medications today. She'll continue to do her exercises and physical therapy.    Meds &  Orders: No orders of the defined types were placed in this encounter.  No orders of the defined types were placed in this encounter.   Follow-up: Return for Left S1 transforaminal epidural steroid injection..   Procedures: No procedures performed  No notes on file   Clinical History: MRI LUMBAR SPINE WITHOUT CONTRAST 03/18/2017   TECHNIQUE: Multiplanar, multisequence MR imaging of the lumbar spine was performed. No intravenous contrast was administered.  COMPARISON: 12/22/2016  FINDINGS: Segmentation: Standard.  Alignment: Slight facet mediated anterolisthesis at L4-5  Vertebrae: Mild degenerative STIR hyperintensity about the L5-S1 endplates, stable. Chronic L3 superior endplate and L2 inferior endplate Schmorl's nodes. No acute fracture, discitis, or aggressive bone lesion.  Conus medullaris: Extends to the L2 level and appears normal.  Paraspinal and other soft tissues: Negative  Disc levels:  T12- L1: Unremarkable.  L1-L2: Unremarkable.  L2-L3: Disc narrowing and desiccation with mild bulging. Negative facets. No impingement  L3-L4: Disc narrowing and desiccation with mild bulging. Negative facets. No impingement  L4-L5: Facet arthropathy with slight anterolisthesis. Chronic right foraminal disc protrusion contacting the L4 nerve root without compression.  L5-S1:Moderate degenerative disc narrowing with bulging and superimposed bilateral paracentral disc protrusions, contacting the S1 nerve root on the right. Patent foramina. Mild facet spurring  IMPRESSION: 1. Stable compared to 12/22/2016. 2. Moderate disc degeneration from L2-3 to L5-S1. Facet arthropathy notable at L4-5 where there is slight anterolisthesis. 3. L4-5 chronic small right foraminal disc protrusion contacting the L4 nerve root. 4. L5-S1 chronic small right paracentral disc protrusion contacting the right S1 nerve root.  Lumbar Spine MRI 12/22/2016 L4-5: Disc bulging with a small right  foraminal disc protrusion which contacts the exiting right L4 nerve without evidence of neural compression. Ligamentum flavum thickening and moderate facet arthrosis. Mild right neural foraminal stenosis. No spinal stenosis.  L5-S1: Disc bulging, broad right paracentral disc protrusion, and mild facet arthrosis result in moderate right lateral recess and mild right neural foraminal stenosis. Potential right S1 nerve root impingement. No spinal stenosis.  IMPRESSION: 1. L5-S1 disc protrusion with right lateral recess stenosis, potentially affecting the right S1 nerve root. Mild right foraminal stenosis. 2. Small right foraminal disc protrusion at L4-5 contacting the right L4 nerve.  She reports that she quit smoking about 15 years ago. Her smoking use included Cigarettes. She has never used smokeless tobacco. No results for input(s): HGBA1C, LABURIC in the last 8760 hours.  Objective:  VS:  HT:    WT:   BMI:     BP:113/66  HR:76bpm  TEMP: ( )  RESP:  Physical Exam  Constitutional: She is oriented to person, place, and time. She appears well-developed and well-nourished.  Eyes: Pupils are equal, round, and reactive to light. Conjunctivae and EOM are normal.  Cardiovascular: Normal rate and intact distal pulses.   Pulmonary/Chest: Effort normal.  Musculoskeletal:  Patient ambulates without aid with a normal gait. She has good distal strength without clonus. No pain with hip rotation. Equivocally positive slump test on the left.  Neurological: She is alert and oriented to person, place, and time. She exhibits normal muscle tone.  Skin: Skin is warm and dry. No rash  noted. No erythema.  Psychiatric: She has a normal mood and affect. Her behavior is normal.  Nursing note and vitals reviewed.   Ortho Exam Imaging: No results found.  Past Medical/Family/Surgical/Social History: Medications & Allergies reviewed per EMR Patient Active Problem List   Diagnosis Date Noted  .  Allergic rhinitis 02/25/2016  . Low back pain 09/29/2015  . Carpal tunnel syndrome, right 08/20/2015  . Hx of completed stroke 05/21/2015  . Memory impairment 05/21/2015  . Hyperthyroidism 12/29/2008  . HYPERCHOLESTEROLEMIA 12/29/2008  . DEPRESSION 12/29/2008  . Essential hypertension 12/29/2008  . Coronary atherosclerosis 12/29/2008  . CAROTID ARTERY STENOSIS 12/29/2008   Past Medical History:  Diagnosis Date  . Cancer [facility])    breast cancer  . Carotid artery stenosis   . CHF (congestive heart failure) ([place])   . Coronary artery disease   . Depression   . Hypercholesteremia   . Hypertension   . Hyperthyroidism   . Stroke [facility])    Family History  Problem Relation Age of Onset  . Lung cancer Mother        died at an early age  . Cancer Mother        lung  . Other Unknown        There is no Hx of premature coronary artery disease in her family   Past Surgical History:  Procedure Laterality Date  . ABDOMINAL HYSTERECTOMY    . BREAST LUMPECTOMY  1999  . BREAST LUMPECTOMY     left breast  . CARDIAC SURGERY    . CAROTID ENDARTERECTOMY  04-2000   left  . CORONARY ARTERY BYPASS GRAFT  2001   x 5 (left internal mammary artery to left anterior  descending coronary artery   Social History   Occupational History  . Social worker Unemployed   Social History Main Topics  . Smoking status: Former Smoker    Types: Cigarettes    Quit date: 09/11/2001  . Smokeless tobacco: Never Used  . Alcohol use No  . Drug use: No  . Sexual activity: Not on file

## 2017-04-25 ENCOUNTER — Ambulatory Visit (INDEPENDENT_AMBULATORY_CARE_PROVIDER_SITE_OTHER): Payer: Medicare Other | Admitting: Orthopedic Surgery

## 2017-04-25 DIAGNOSIS — IMO0002 Reserved for concepts with insufficient information to code with codable children: Secondary | ICD-10-CM | POA: Insufficient documentation

## 2017-04-25 DIAGNOSIS — L03031 Cellulitis of right toe: Secondary | ICD-10-CM

## 2017-04-25 MED ORDER — DOXYCYCLINE HYCLATE 100 MG PO TABS: 100.0000 mg | ORAL_TABLET | Freq: Two times a day (BID) | ORAL | 0 refills | Status: DC

## 2017-04-25 MED ORDER — HYDROCODONE-ACETAMINOPHEN 5-325 MG PO TABS: 1.0000 | ORAL_TABLET | ORAL | 0 refills | Status: DC | PRN

## 2017-04-25 NOTE — Progress Notes (Signed)
Office Visit Note   Patient: Brenda [name]           Date of Birth: 03/11/1956           MRN: [id number] Visit Date: 04/25/2017              Requested by: [name], MD [address], [place] [address] PCP: [name], MD  No chief complaint on file.     HPI: Patient is a 61 year old woman who presents complaining of redness pain and swelling of the right great toe. She states she did go to a podiatrist and underwent excision of the nail she states she still has the pain redness and swelling. Past medical history was updated review of systems negative fever chills negative for diabetes.  Assessment & Plan: Visit Diagnoses:  1. Felon     Plan: Patient is given a prescription for doxycycline a postoperative shoe prescription for Vicodin for pain recommended ice and elevation minimize her activities and 2 days she will wash the foot with soap and water removed the iodoform gauze start dry dressing changes.  Follow-Up Instructions: Return in about 1 week (around 05/02/2017).   Ortho Exam  Patient is alert, oriented, no adenopathy, well-dressed, normal affect, normal respiratory effort. Patient has a good dorsalis pedis pulse she has good ankle and subtalar motion she has a fluctuant mass over the medial border of the tip of the right great toe the nail is growing in she has no paronychial infection no signs of any previous infection of the base of the nail there is no tenderness to palpation beneath the nail there is fluctuance over the medial border of the tip of the toe. Patient clinically has a felon. After informed consent and sterile prepping patient underwent a digital block with 10 mL of quarter percent Marcaine plain. After adequate levels anesthesia obtained a 10 blade knife was used to decompress the felon along the plantar aspect of the tuft of the great toe this was debrided and with open with iodoform gauze a sterile dressing was applied. Patient  was placed in a postoperative shoe.  Imaging: No results found. No images are attached to the encounter.  Labs: Lab Results  Component Value Date   HGBA1C 5.7 05/21/2015    Orders:  No orders of the defined types were placed in this encounter.  Meds ordered this encounter  Medications  . doxycycline (VIBRA-TABS) 100 MG tablet    Sig: Take 1 tablet (100 mg total) by mouth 2 (two) times daily.    Dispense:  60 tablet    Refill:  0  . HYDROcodone-acetaminophen (NORCO/VICODIN) 5-325 MG tablet    Sig: Take 1 tablet by mouth every 4 (four) hours as needed for moderate pain.    Dispense:  30 tablet    Refill:  0     Procedures: No procedures performed  Clinical Data: No additional findings.  ROS:  All other systems negative, except as noted in the HPI. Review of Systems  Objective: Vital Signs: There were no vitals taken for this visit.  Specialty Comments:  No specialty comments available.  PMFS History: Patient Active Problem List   Diagnosis Date Noted  . 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 Allergic rhinitis 02/25/2016  . Low back pain 09/29/2015  . Carpal tunnel syndrome, right 08/20/2015  . Hx of completed stroke 05/21/2015  . Memory impairment 05/21/2015  . Hyperthyroidism 12/29/2008  . HYPERCHOLESTEROLEMIA 12/29/2008  . DEPRESSION 12/29/2008  . Essential hypertension  12/29/2008  . Coronary atherosclerosis 12/29/2008  . CAROTID ARTERY STENOSIS 12/29/2008   Past Medical History:  Diagnosis Date  . Cancer [facility])    breast cancer  . Carotid artery stenosis   . CHF (congestive heart failure) ([place])   . Coronary artery disease   . Depression   . Hypercholesteremia   . Hypertension   . Hyperthyroidism   . Stroke [facility])     Family History  Problem Relation Age of Onset  . Lung cancer Mother        died at an early age  . Cancer Mother        lung  . Other Unknown        There is no Hx of premature coronary artery disease in her family    Past Surgical History:    Procedure Laterality Date  . ABDOMINAL HYSTERECTOMY    . BREAST LUMPECTOMY  1999  . BREAST LUMPECTOMY     left breast  . CARDIAC SURGERY    . CAROTID ENDARTERECTOMY  04-2000   left  . CORONARY ARTERY BYPASS GRAFT  2001   x 5 (left internal mammary artery to left anterior  descending coronary artery   Social History   Occupational History  . Social worker Unemployed   Social History Main Topics  . Smoking status: Former Smoker    Types: Cigarettes    Quit date: 09/11/2001  . Smokeless tobacco: Never Used  . Alcohol use No  . Drug use: No  . Sexual activity: Not on file

## 2017-04-26 ENCOUNTER — Telehealth (INDEPENDENT_AMBULATORY_CARE_PROVIDER_SITE_OTHER): Payer: Self-pay | Admitting: Orthopedic Surgery

## 2017-04-26 NOTE — Telephone Encounter (Signed)
Call patient, she could take 2 Vicodin at a time. Recommend ice and elevation.

## 2017-04-26 NOTE — Telephone Encounter (Signed)
Pt had procedure done on her great toe yesterday was given a RX for hydrocodone and antibiotic, pt is c/o foot burn and throbbing all morning, wants to know if there is something stronger for pain MD can RX her? Please advise!  CB# [phone]

## 2017-04-26 NOTE — Telephone Encounter (Signed)
Advised patient that she can take two vicodin at a time, elevate and ice.

## 2017-04-30 ENCOUNTER — Encounter (INDEPENDENT_AMBULATORY_CARE_PROVIDER_SITE_OTHER): Payer: Medicare Other | Admitting: Physical Medicine and Rehabilitation

## 2017-05-01 ENCOUNTER — Ambulatory Visit (INDEPENDENT_AMBULATORY_CARE_PROVIDER_SITE_OTHER): Payer: Medicare Other | Admitting: Orthopedic Surgery

## 2017-05-01 ENCOUNTER — Encounter (INDEPENDENT_AMBULATORY_CARE_PROVIDER_SITE_OTHER): Payer: Self-pay | Admitting: Orthopedic Surgery

## 2017-05-01 DIAGNOSIS — IMO0002 Reserved for concepts with insufficient information to code with codable children: Secondary | ICD-10-CM

## 2017-05-01 DIAGNOSIS — L03019 Cellulitis of unspecified finger: Secondary | ICD-10-CM

## 2017-05-01 NOTE — Progress Notes (Signed)
Office Visit Note   Patient: Brenda [name]           Date of Birth: [birth year]           MRN: [id number] Visit Date: 05/01/2017              Requested by: [name], MD [address], [place] [address] PCP: [name], MD  Chief Complaint  Patient presents with  . Right Great Toe - Follow-up      HPI: Patient is a 81 woman who presents 1 week status post irrigation and debridement for a felon infection right great toe. Patient states that she is feeling better she is completing her course of doxycycline she states she still has occasional nerve tingling pain which she's been up on her foot a lot. She is currently wearing flip-flop sandals.  Assessment & Plan: Visit Diagnoses:  1. Felon     Plan: Recommend a stiff soled walking sneaker recommended washing with soap and water and antibiotic ointment with Band-Aid finish up her doxycycline follow-up next week.  Follow-Up Instructions: No Follow-up on file.   Ortho Exam  Patient is alert, oriented, no adenopathy, well-dressed, normal affect, normal respiratory effort. Examination the fluctuance has resolved there is no tenderness to palpation of the soft tissue at this time there is no signs of infection around the nail. There is no cellulitis no drainage no clinical signs of infection. The swelling has improved.  Imaging: No results found. No images are attached to the encounter.  Labs: Lab Results  Component Value Date   HGBA1C 5.7 05/21/2015    Orders:  No orders of the defined types were placed in this encounter.  No orders of the defined types were placed in this encounter.    Procedures: No procedures performed  Clinical Data: No additional findings.  ROS:  All other systems negative, except as noted in the HPI. Review of Systems  Objective: Vital Signs: There were no vitals taken for this visit.  Specialty Comments:  No specialty comments available.  PMFS  History: Patient Active Problem List   Diagnosis Date Noted  . 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 Allergic rhinitis 02/25/2016  . Low back pain 09/29/2015  . Carpal tunnel syndrome, right 08/20/2015  . Hx of completed stroke 05/21/2015  . Memory impairment 05/21/2015  . Hyperthyroidism 12/29/2008  . HYPERCHOLESTEROLEMIA 12/29/2008  . DEPRESSION 12/29/2008  . Essential hypertension 12/29/2008  . Coronary atherosclerosis 12/29/2008  . CAROTID ARTERY STENOSIS 12/29/2008   Past Medical History:  Diagnosis Date  . Cancer [facility])    breast cancer  . Carotid artery stenosis   . CHF (congestive heart failure) ([place])   . Coronary artery disease   . Depression   . Hypercholesteremia   . Hypertension   . Hyperthyroidism   . Stroke [facility])     Family History  Problem Relation Age of Onset  . Lung cancer Mother        died at an early age  . Cancer Mother        lung  . Other Unknown        There is no Hx of premature coronary artery disease in her family    Past Surgical History:  Procedure Laterality Date  . ABDOMINAL HYSTERECTOMY    . BREAST LUMPECTOMY  1999  . BREAST LUMPECTOMY     left breast  . CARDIAC SURGERY    . CAROTID ENDARTERECTOMY  04-2000   left  . CORONARY  ARTERY BYPASS GRAFT  2001   x 5 (left internal mammary artery to left anterior  descending coronary artery   Social History   Occupational History  . Social worker Unemployed   Social History Main Topics  . Smoking status: Former Smoker    Types: Cigarettes    Quit date: 09/11/2001  . Smokeless tobacco: Never Used  . Alcohol use No  . Drug use: No  . Sexual activity: Not on file

## 2017-05-08 ENCOUNTER — Ambulatory Visit (INDEPENDENT_AMBULATORY_CARE_PROVIDER_SITE_OTHER): Payer: Medicare Other | Admitting: Orthopedic Surgery

## 2017-05-10 ENCOUNTER — Other Ambulatory Visit (INDEPENDENT_AMBULATORY_CARE_PROVIDER_SITE_OTHER): Payer: Medicare Other

## 2017-05-10 ENCOUNTER — Encounter: Payer: Self-pay | Admitting: Family Medicine

## 2017-05-10 ENCOUNTER — Ambulatory Visit (INDEPENDENT_AMBULATORY_CARE_PROVIDER_SITE_OTHER): Payer: Medicare Other | Admitting: Family Medicine

## 2017-05-10 VITALS — BP 138/64 | HR 70 | Wt 165.0 lb

## 2017-05-10 DIAGNOSIS — I739 Peripheral vascular disease, unspecified: Secondary | ICD-10-CM

## 2017-05-10 DIAGNOSIS — M79605 Pain in left leg: Secondary | ICD-10-CM | POA: Insufficient documentation

## 2017-05-10 LAB — IBC PANEL
Iron: 81 ug/dL (ref 42–145)
Saturation Ratios: 24.3 % (ref 20.0–50.0)
Transferrin: 238 mg/dL (ref 212.0–360.0)

## 2017-05-10 LAB — SEDIMENTATION RATE: Sed Rate: 61 mm/hr — ABNORMAL HIGH (ref 0–30)

## 2017-05-10 MED ORDER — VENLAFAXINE HCL ER 37.5 MG PO CP24: 37.5000 mg | ORAL_CAPSULE | Freq: Every day | ORAL | 1 refills | Status: DC

## 2017-05-10 NOTE — Assessment & Plan Note (Signed)
Patient continues to have left leg pain. Past medical history is significant for lumbar radiculopathy but did respond a little bit to injections but has not responded to formal physical therapy. Possibility for some underlying hip arthritis that could be contributing. Patient is also had significant vascular pathology over the course of the years. Ultrasound of the abdomen ordered as well as ABIs. Patient did have an EMG that showed normal nerve

## 2017-05-10 NOTE — Patient Instructions (Addendum)
Good to see you  We will see if we can find out what is going on.  Labs today downstairs We will check blood supply to legs and in the stomach to evaluate for any blood flow issues.  Effexor 37.5 mg daily  Gabapentin only at night Over the counter try  Tart cherry extract any dose at night  Turmeric 50omg daily  Vitamin D 20009 IU daily  See me again in 3-4 weeks

## 2017-05-10 NOTE — Progress Notes (Signed)
[facility] [address], [place] [address] Phone: [phone] Subjective:    I'm seeing this patient by the request  of:    CC: Low back pain and left leg pain  [id number]  Brenda [name] is a 61 y.o. female coming in with complaint of back pain. She is having sciatic nerve pain. Dr. Marlou Sa did 2 MRIs. She was then sent to Dr. Ernestina Patches for injections. She has had 3 injections with him but is still having some numbness and tingling on the right leg and foot with the majority of the tingling and numbness on the left leg and foot. She has also been doing PT with Barbaraann Barthel. She was progressing but has recently seen a regression in her symptoms. She is active and does yoga but has a hard time moving once she gets home from class.   History of stroke, breast cancer, open heart surgery  Onset- chronic Location- sacral spine Duration- intermittent Patient states that this is very frustrated. Feels like she has not made any significant improvement over the course of time.     Past Medical History:  Diagnosis Date  . Cancer [facility])    breast cancer  . Carotid artery stenosis   . CHF (congestive heart failure) ([place])   . Coronary artery disease   . Depression   . Hypercholesteremia   . Hypertension   . Hyperthyroidism   . Stroke [facility])    Past Surgical History:  Procedure Laterality Date  . ABDOMINAL HYSTERECTOMY    . BREAST LUMPECTOMY  1999  . BREAST LUMPECTOMY     left breast  . CARDIAC SURGERY    . CAROTID ENDARTERECTOMY  04-2000   left  . CORONARY ARTERY BYPASS GRAFT  2001   x 5 (left internal mammary artery to left anterior  descending coronary artery   Social History   Social History  . Marital status: Single    Spouse name: N/A  . Number of children: 1  . Years of education: N/A   Occupational History  . Social worker Unemployed   Social History Main Topics  . Smoking status: Former Smoker    Types: Cigarettes    Quit  date: 09/11/2001  . Smokeless tobacco: Never Used  . Alcohol use No  . Drug use: No  . Sexual activity: Not Asked   Other Topics Concern  . None   Social History Narrative  . None   Allergies  Allergen Reactions  . Latex     unknown   Family History  Problem Relation Age of Onset  . Lung cancer Mother        died at an early age  . Cancer Mother        lung  . Other Unknown        There is no Hx of premature coronary artery disease in her family     Past medical history, social, surgical and family history all reviewed in electronic medical record.  No pertanent information unless stated regarding to the chief complaint.   Review of Systems:Review of systems updated and as accurate as of 05/10/17  No headache, visual changes, nausea, vomiting, diarrhea, constipation, dizziness, abdominal pain, skin rash, fevers, chills, night sweats, weight loss, swollen lymph nodes, body aches, joint swelling,  chest pain, shortness of breath, mood changes. Positive muscle aches  Objective  Blood pressure 138/64, pulse 70, weight 165 lb (74.8 kg). Systems examined below as of 05/10/17   General:  No apparent distress alert and oriented x3 mood and affect normal, dressed appropriately.  HEENT: Pupils equal, extraocular movements intact  Respiratory: Patient's speak in full sentences and does not appear short of breath  Cardiovascular: No lower extremity edema, non tender, no erythema  Skin: Warm dry intact with no signs of infection or rash on extremities or on axial skeleton.  Abdomen: Soft Patient does have somewhat of a pulsatile mass of aorta noted. Seems to be somewhat a little bit enlarged. Neuro: Cranial nerves II through XII are intact, neurovascularly intact in all extremities with 2+ DTRs and 2+ pulses.  Lymph: No lymphadenopathy of posterior or anterior cervical chain or axillae bilaterally.  Gait normal with good balance and coordination.  MSK:  Non tender with full range of  motion and good stability and symmetric strength and tone of shoulders, elbows, wrist, hip, knee and ankles bilaterally.  Back exam shows the patient does have some mild loss of lordosis. Mild pain over the left sacroiliac joint. Tightness of the less Faber test. Patient does have tightness of the hamstrings seems to be bilateral. Neurovascularly intact distally with full strength. Deep tendon reflexes 2+.    Impression and Recommendations:     This case required medical decision making of moderate complexity.      Note: This dictation was prepared with Dragon dictation along with smaller phrase technology. Any transcriptional errors that result from this process are unintentional.

## 2017-05-11 LAB — ANTI-NUCLEAR AB-TITER (ANA TITER): ANA Titer 1: 1:160 {titer} — ABNORMAL HIGH

## 2017-05-11 LAB — VITAMIN D 25 HYDROXY (VIT D DEFICIENCY, FRACTURES): VITD: 42.62 ng/mL (ref 30.00–100.00)

## 2017-05-11 LAB — CALCIUM, IONIZED: Calcium, Ion: 5.7 mg/dL — ABNORMAL HIGH (ref 4.8–5.6)

## 2017-05-11 LAB — CYCLIC CITRUL PEPTIDE ANTIBODY, IGG: Cyclic Citrullin Peptide Ab: <16 Units

## 2017-05-11 LAB — ANA: Anti Nuclear Antibody(ANA): POSITIVE — AB

## 2017-05-11 LAB — RHEUMATOID FACTOR: Rhuematoid fact SerPl-aCnc: <14 IU/mL (ref <14)

## 2017-05-16 ENCOUNTER — Telehealth: Payer: Self-pay | Admitting: Family Medicine

## 2017-05-16 NOTE — Telephone Encounter (Signed)
Patient states she is scheduled for a doppler tomorrow.  She would like to know if she can have a doppler of both legs now.  Please follow up in regard.

## 2017-05-16 NOTE — Telephone Encounter (Signed)
Spoke with pt, advised her we did order a doppler bilateral. Pt understood.

## 2017-05-17 ENCOUNTER — Ambulatory Visit (INDEPENDENT_AMBULATORY_CARE_PROVIDER_SITE_OTHER): Payer: Medicare Other | Admitting: Orthopedic Surgery

## 2017-05-17 DIAGNOSIS — L03019 Cellulitis of unspecified finger: Secondary | ICD-10-CM

## 2017-05-17 DIAGNOSIS — IMO0002 Reserved for concepts with insufficient information to code with codable children: Secondary | ICD-10-CM

## 2017-05-17 NOTE — Progress Notes (Signed)
Office Visit Note   Patient: Brenda [name]           Date of Birth: 22-Oct-1955           MRN: [id number] Visit Date: 05/17/2017              Requested by: [name], MD [address],  [place] [id number] PCP: [name], MD  Chief Complaint  Patient presents with  . Right Foot - Follow-up      HPI:  patient is a 61 year old woman who presents follow-up status post debridement of a felon  Infection of the soft tissue of the plantar aspect the right great toe. Patient was placed on oral antibiotics she states she keeps forgetting to take the antibiotics and has not completed her course of antibiotics. Patient has pain when she bangs the end of the toe. She states she feels that the nail is coming back.  Assessment & Plan: Visit Diagnoses:  1. Felon     Plan:  Recommended she complete her antibiotics recommended antibiotic ointment over the nailbed and over the scab medially. Follow-up if she develops any change in her signs or symptoms.  Follow-Up Instructions: Return if symptoms worsen or fail to improve.   Ortho Exam  Patient is alert, oriented, no adenopathy, well-dressed, normal affect, normal respiratory effort.  examination patient has a normal gait she has good pulses of the nailbed shows no signs of infection there is no paronychial infection there is no fluctuance or fluid in the plantar aspect of the toe. No clinical signs of infection there is no redness no cellulitis no drainage. Patient has no tenderness to palpation on the plantar aspect but she does have tenderness to palpation over the scab on the incision.  Imaging: No results found. No images are attached to the encounter.  Labs: Lab Results  Component Value Date   HGBA1C 5.7 05/21/2015   ESRSEDRATE 61 (H) 05/10/2017    Orders:  No orders of the defined types were placed in this encounter.  No orders of the defined types were placed in this encounter.    Procedures: No  procedures performed  Clinical Data: No additional findings.  ROS:  All other systems negative, except as noted in the HPI. Review of Systems  Objective: Vital Signs: There were no vitals taken for this visit.  Specialty Comments:  No specialty comments available.  PMFS History: Patient Active Problem List   Diagnosis Date Noted  . Left leg pain 05/10/2017  . 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 Allergic rhinitis 02/25/2016  . Low back pain 09/29/2015  . Carpal tunnel syndrome, right 08/20/2015  . Hx of completed stroke 05/21/2015  . Memory impairment 05/21/2015  . Hyperthyroidism 12/29/2008  . HYPERCHOLESTEROLEMIA 12/29/2008  . DEPRESSION 12/29/2008  . Essential hypertension 12/29/2008  . Coronary atherosclerosis 12/29/2008  . CAROTID ARTERY STENOSIS 12/29/2008   Past Medical History:  Diagnosis Date  . Cancer [facility])    breast cancer  . Carotid artery stenosis   . CHF (congestive heart failure) ([place])   . Coronary artery disease   . Depression   . Hypercholesteremia   . Hypertension   . Hyperthyroidism   . Stroke [facility])     Family History  Problem Relation Age of Onset  . Lung cancer Mother        died at an early age  . Cancer Mother        lung  . Other Unknown  There is no Hx of premature coronary artery disease in her family    Past Surgical History:  Procedure Laterality Date  . ABDOMINAL HYSTERECTOMY    . BREAST LUMPECTOMY  1999  . BREAST LUMPECTOMY     left breast  . CARDIAC SURGERY    . CAROTID ENDARTERECTOMY  04-2000   left  . CORONARY ARTERY BYPASS GRAFT  2001   x 5 (left internal mammary artery to left anterior  descending coronary artery   Social History   Occupational History  . Social worker Unemployed   Social History Main Topics  . Smoking status: Former Smoker    Types: Cigarettes    Quit date: 09/11/2001  . Smokeless tobacco: Never Used  . Alcohol use No  . Drug use: No  . Sexual activity: Not on file

## 2017-05-18 ENCOUNTER — Ambulatory Visit (HOSPITAL_COMMUNITY)
Admission: RE | Admit: 2017-05-18 | Discharge: 2017-05-18 | Disposition: A | Discharge status: Home / Self Care | Payer: Medicare Other | Source: Ambulatory Visit | Attending: Cardiovascular Disease | Admitting: Cardiovascular Disease

## 2017-05-18 ENCOUNTER — Other Ambulatory Visit: Payer: Self-pay | Admitting: Family Medicine

## 2017-05-18 DIAGNOSIS — R200 Anesthesia of skin: Secondary | ICD-10-CM | POA: Diagnosis not present

## 2017-05-18 DIAGNOSIS — R202 Paresthesia of skin: Secondary | ICD-10-CM | POA: Diagnosis not present

## 2017-05-18 DIAGNOSIS — I739 Peripheral vascular disease, unspecified: Secondary | ICD-10-CM | POA: Diagnosis present

## 2017-05-21 ENCOUNTER — Other Ambulatory Visit: Payer: Self-pay | Admitting: *Deleted

## 2017-05-21 DIAGNOSIS — I739 Peripheral vascular disease, unspecified: Secondary | ICD-10-CM

## 2017-05-22 ENCOUNTER — Telehealth: Payer: Self-pay | Admitting: Family Medicine

## 2017-05-22 NOTE — Telephone Encounter (Signed)
Discussed with pt

## 2017-05-22 NOTE — Telephone Encounter (Signed)
Patient states there was a blockage found in her leg and she is waiting for surgery to be scheduled by Dr. Tamala Julian.  Right now patient states she is having a lot of discomfort at night with cramping. Patient has uses ice pack and heating pad.  States its also becoming more difficult to walk. Patient is requesting a call back in regard.

## 2017-05-22 NOTE — Telephone Encounter (Signed)
lmovm for pt to return call.  

## 2017-05-22 NOTE — Telephone Encounter (Signed)
Pt is scheduled with vascular on 9.14.18.

## 2017-05-22 NOTE — Telephone Encounter (Signed)
If worsening pain then consider ER

## 2017-05-25 ENCOUNTER — Ambulatory Visit (INDEPENDENT_AMBULATORY_CARE_PROVIDER_SITE_OTHER): Payer: Medicare Other | Admitting: Vascular Surgery

## 2017-05-25 ENCOUNTER — Encounter: Payer: Self-pay | Admitting: Vascular Surgery

## 2017-05-25 VITALS — BP 145/77 | HR 105 | Temp 99.3°F | Resp 16 | Ht 65.0 in | Wt 164.0 lb

## 2017-05-25 DIAGNOSIS — I739 Peripheral vascular disease, unspecified: Secondary | ICD-10-CM

## 2017-05-25 DIAGNOSIS — I70229 Atherosclerosis of native arteries of extremities with rest pain, unspecified extremity: Secondary | ICD-10-CM

## 2017-05-25 DIAGNOSIS — I998 Other disorder of circulatory system: Secondary | ICD-10-CM | POA: Diagnosis not present

## 2017-05-25 NOTE — Progress Notes (Signed)
Vitals:   05/25/17 1510  BP: (!) 161/69  Pulse: (!) 105  Resp: 16  Temp: 99.3 F (37.4 C)  SpO2: 100%  Weight: 164 lb (74.4 kg)  Height: 5\' 5"  (1.651 m)

## 2017-05-25 NOTE — Progress Notes (Signed)
Patient ID: Brenda [name], female   DOB: 1955-12-23, 61 y.o.   MRN: [id number]  Reason for Consult: New Patient (Initial Visit) (abnormal abi's)   Referred by Lyndal Pulley, DO  Subjective:     HPI:  Brenda [name] is a 61 y.o. female with history of coronary artery disease status post bypass and also had carotid endarterectomy 16 years ago. She had harvest of her right greater saphenous vein that time for that procedure. She now has a history of short distance claudication less than 250 ft. She is a former smoker but quit several years ago. She is nondiabetic does have hypertension and hypercholesterolemia take statin and aspirin. She has had back injections for leg pain these have not given her much relief. She states that she does have pain at rest now causing her to hang her legs over the side of the bed. It is in the left greater than right. She also has an ulcer on her right great toe that has been lanced by Dr. due to and then was appropriately referred for vascular evaluation. She states that the ulcer on the right is stable. She did have vascular studies at [facility] which are available for review today.    Past Medical History:  Diagnosis Date  . Cancer [facility])    breast cancer  . Carotid artery stenosis   . CHF (congestive heart failure) ([place])   . Coronary artery disease   . Depression   . Hypercholesteremia   . Hypertension   . Hyperthyroidism   . Stroke [facility])    Family History  Problem Relation Age of Onset  . Lung cancer Mother        died at an early age  . Cancer Mother        lung  . Other Unknown        There is no Hx of premature coronary artery disease in her family   Past Surgical History:  Procedure Laterality Date  . ABDOMINAL HYSTERECTOMY    . BREAST LUMPECTOMY  1999  . BREAST LUMPECTOMY     left breast  . CARDIAC SURGERY    . CAROTID ENDARTERECTOMY  04-2000   left  . CORONARY ARTERY BYPASS GRAFT  2001   x 5 (left internal mammary artery to left  anterior  descending coronary artery    Short Social History:  Social History  Substance Use Topics  . Smoking status: Former Smoker    Types: Cigarettes    Quit date: 09/11/2001  . Smokeless tobacco: Never Used  . Alcohol use No    Allergies  Allergen Reactions  . Latex     unknown    Current Outpatient Prescriptions  Medication Sig Dispense Refill  . aspirin 81 MG tablet Take 81 mg by mouth daily.      . Cholecalciferol (VITAMIN D) 1000 UNITS capsule Take 2,000 Units by mouth daily.     Marland Kitchen doxycycline (VIBRA-TABS) 100 MG tablet Take 1 tablet (100 mg total) by mouth 2 (two) times daily. 60 tablet 0  . gabapentin (NEURONTIN) 300 MG capsule Take 1 capsule by mouth at night for 7 nights and then 1 in the morning and one at night for 7 days and then 3 times a day. 90 capsule 0  . HYDROcodone-acetaminophen (NORCO/VICODIN) 5-325 MG tablet Take 1 tablet by mouth every 4 (four) hours as needed for moderate pain. 30 tablet 0  . hydrocortisone 2.5 % cream Apply topically 2 (two) times daily. Apply sparingly for  maximum 7 days 28 g 0  . lisinopril-hydrochlorothiazide (PRINZIDE,ZESTORETIC) 20-25 MG tablet Take 2 tablets by mouth daily. 180 tablet 1  . metoprolol succinate (TOPROL-XL) 100 MG 24 hr tablet TAKE 1 TABLET BY MOUTH ONCE DAILY WITH  OR  IMMEDIATELY  FOLLOWING  A  MEAL 90 tablet 3  . Multiple Vitamin (MULTIVITAMIN) tablet Take 1 tablet by mouth daily.      . naproxen (NAPROSYN) 500 MG tablet Take 1 tablet (500 mg total) by mouth 2 (two) times daily with a meal. 20 tablet 0  . nitroGLYCERIN (NITROSTAT) 0.4 MG SL tablet Place 1 tablet (0.4 mg total) under the tongue every 5 (five) minutes as needed for chest pain (x 3 tabs daily). 20 tablet 1  . rosuvastatin (CRESTOR) 20 MG tablet     . rosuvastatin (CRESTOR) 40 MG tablet Take 1 tablet (40 mg total) by mouth daily. 90 tablet 3  . spironolactone (ALDACTONE) 25 MG tablet Take 2 tablets (50 mg total) by mouth daily. 60 tablet 3  . traMADol  (ULTRAM) 50 MG tablet Take 1 tablet (50 mg total) by mouth every 8 (eight) hours as needed for moderate pain or severe pain. 40 tablet 0  . venlafaxine XR (EFFEXOR XR) 37.5 MG 24 hr capsule Take 1 capsule (37.5 mg total) by mouth daily with breakfast. 30 capsule 1  . amLODipine (NORVASC) 10 MG tablet Take 1 tablet (10 mg total) by mouth daily. 30 tablet 5   No current facility-administered medications for this visit.     Review of Systems  Constitutional:  Constitutional negative. HENT: HENT negative.  Eyes: Eyes negative.  Respiratory: Respiratory negative.  Cardiovascular: Positive for claudication and leg swelling.  GI: Gastrointestinal negative.  Musculoskeletal: Musculoskeletal negative.  Skin: Positive for wound.  Neurological: Neurological negative. Hematologic: Hematologic/lymphatic negative.  Psychiatric: Psychiatric negative.        Objective:  Objective   Vitals:   05/25/17 1510 05/25/17 1516  BP: (!) 161/69 (!) 145/77  Pulse: (!) 105 (!) 105  Resp: 16   Temp: 99.3 F (37.4 C)   SpO2: 100%   Weight: 164 lb (74.4 kg)   Height: 5\' 5"  (1.651 m)    Body mass index is 27.29 kg/m.  Physical Exam  Constitutional: She is oriented to person, place, and time. She appears well-developed.  HENT:  Head: Normocephalic.  Eyes: Pupils are equal, round, and reactive to light.  Neck: Normal range of motion.  Cardiovascular: Normal rate.   Pulses:      Femoral pulses are 0 on the right side, and 0 on the left side. Monophasic dp bilaterally  Pulmonary/Chest: Effort normal.  Abdominal: Soft. She exhibits no mass.  Musculoskeletal: Normal range of motion. She exhibits no edema or deformity.  Neurological: She is alert and oriented to person, place, and time.  Skin:  Right great toe ulceration that is dry, no erythema  Psychiatric: She has a normal mood and affect. Her behavior is normal. Judgment and thought content normal.    Data:      Assessment/Plan:      61 year old female presents for bilateral lower extremity rest pain right great toe ulceration. She does not have reliably palpable femoral pulses and so we will proceed with CT angiogram of her abdomen and pelvis with bilateral lower extremity runoff. I discussed the future options being possible surgery versus stenting. She demonstrated good understanding continue aspirin and statin drugs. I'll give her 3 month handicap sticker to go to the [facility] with. She  understands this will be temporary. I will see her in 2 weeks following CT scan.     Waynetta Sandy MD [facility]

## 2017-05-31 ENCOUNTER — Ambulatory Visit: Payer: Medicare Other | Admitting: Family Medicine

## 2017-05-31 NOTE — Addendum Note (Signed)
Addended by: Lianne Cure A on: 05/31/2017 03:34 PM   Modules accepted: Orders

## 2017-06-01 ENCOUNTER — Other Ambulatory Visit: Payer: Self-pay | Admitting: Family Medicine

## 2017-06-01 ENCOUNTER — Ambulatory Visit (INDEPENDENT_AMBULATORY_CARE_PROVIDER_SITE_OTHER): Payer: Medicare Other | Admitting: Family Medicine

## 2017-06-01 ENCOUNTER — Encounter: Payer: Self-pay | Admitting: Family Medicine

## 2017-06-01 ENCOUNTER — Ambulatory Visit
Admission: RE | Admit: 2017-06-01 | Discharge: 2017-06-01 | Disposition: A | Discharge status: Home / Self Care | Payer: Medicare Other | Source: Ambulatory Visit | Attending: Family Medicine | Admitting: Family Medicine

## 2017-06-01 DIAGNOSIS — I739 Peripheral vascular disease, unspecified: Secondary | ICD-10-CM

## 2017-06-01 MED ORDER — CIPROFLOXACIN HCL 500 MG PO TABS: 500.0000 mg | ORAL_TABLET | Freq: Two times a day (BID) | ORAL | 0 refills | Status: DC

## 2017-06-01 NOTE — Progress Notes (Signed)
[name] [place],  [address] Phone: [phone] Subjective:    I'm seeing this patient by the request  of:    CC: Leg pain  [id number]  Brenda [name] is a 61 y.o. female coming in with complaint of leg pain. Patient was having significant leg pain and did have decreasing sensation in the feet and decreasing dorsalis pedis pulses noted. Patient was sent for an ABI. ABI did show a significant loss of blood flow to the lower extremity is. Patient then was seen by vascular who is now giving a CT angiogram of the abdomen in the near future. Patient has an abdominal aorta ultrasound pending at this time as well. Patient states that the gabapentin has helped her with sleeping. Patient is still concerned with a nonhealing ulcer on her right big toe but does not seem to be worsening. States that it does bleed from time to time. Does have sensation. Patient is still in pain but is happy that we have been able to find a reason why and is looking for some resolution.     Past Medical History:  Diagnosis Date  . Cancer [facility]: [name] W)    breast cancer  . Carotid artery stenosis   . CHF (congestive heart failure) ([place])   . Coronary artery disease   . Depression   . Hypercholesteremia   . Hypertension   . Hyperthyroidism   . Stroke [facility])    Past Surgical History:  Procedure Laterality Date  . ABDOMINAL HYSTERECTOMY    . BREAST LUMPECTOMY  1999  . BREAST LUMPECTOMY     left breast  . CARDIAC SURGERY    . CAROTID ENDARTERECTOMY  04-2000   left  . CORONARY ARTERY BYPASS GRAFT  2001   x 5 (left internal mammary artery to left anterior  descending coronary artery   Social History   Social History  . Marital status: Single    Spouse name: N/A  . Number of children: 1  . Years of education: N/A   Occupational History  . Social worker Unemployed   Social History Main Topics  . Smoking status: Former Smoker    Types: Cigarettes    Quit date:  09/11/2001  . Smokeless tobacco: Never Used  . Alcohol use No  . Drug use: No  . Sexual activity: Not Asked   Other Topics Concern  . None   Social History Narrative  . None   Allergies  Allergen Reactions  . Latex     unknown   Family History  Problem Relation Age of Onset  . Lung cancer Mother        died at an early age  . Cancer Mother        lung  . Other Unknown        There is no Hx of premature coronary artery disease in her family     Past medical history, social, surgical and family history all reviewed in electronic medical record.  No pertanent information unless stated regarding to the chief complaint.   Review of Systems:Review of systems updated and as accurate as of 06/01/17  No headache, visual changes, nausea, vomiting, diarrhea, constipation, dizziness, abdominal pain, skin rash, fevers, chills, night sweats, weight loss, swollen lymph nodes, body aches, joint swelling, , chest pain, shortness of breath, mood changes. Positive muscle aches  Objective  Blood pressure (!) 150/60, pulse 68, height 5\' 5"  (1.651 m), weight 165 lb (74.8 kg), SpO2 98 %.  Systems examined below as of 06/01/17   General: No apparent distress alert and oriented x3 mood and affect normal, dressed appropriately.  HEENT: Pupils equal, extraocular movements intact  Respiratory: Patient's speak in full sentences and does not appear short of breath  Cardiovascular: No lower extremity edema, non tender, no erythema  Skin: Warm dry intact with no signs of infection or rash on extremities or on axial skeleton. Mottle appearance of the lower extremities.  Abdomen: Soft nontender  Neuro: Cranial nerves II through XII are intact, neurovascularly intact in all extremities with 2+ DTRs and 1+ pulsesOf the posterior tibialis and dorsalis pedis which is very faint though.  Lymph: No lymphadenopathy of posterior or anterior cervical chain or axillae bilaterally.  Gait normal with good balance and  coordination.  MSK:  Non tender with full range of motion and good stability and symmetric strength and tone of shoulders, elbows, wrist, hip, knee and ankles bilaterally. Mild atrophy of the musculature of the lower legs bilaterally.     Impression and Recommendations:     This case required medical decision making of moderate complexity.      Note: This dictation was prepared with Dragon dictation along with smaller phrase technology. Any transcriptional errors that result from this process are unintentional.

## 2017-06-01 NOTE — Assessment & Plan Note (Signed)
Severe peripheral vascular disease bilaterally. Patient is following up with vascular this week. Likely will be having surgical intervention. I do not feel that any other medication changes would be extremely beneficial at this time. Patient will have the addition of ciprofloxacin to help with the healing. Patient will continue taking other treatment options at this time.

## 2017-06-01 NOTE — Patient Instructions (Signed)
Good to see you  I am so glad we have a good plan.  You are in good hands Lets add cipro 2 times a day for 10 days for the toe  If you have questions please call or write.

## 2017-06-04 ENCOUNTER — Ambulatory Visit
Admission: RE | Admit: 2017-06-04 | Discharge: 2017-06-04 | Disposition: A | Discharge status: Home / Self Care | Payer: Medicare Other | Source: Ambulatory Visit | Attending: Vascular Surgery | Admitting: Vascular Surgery

## 2017-06-04 DIAGNOSIS — I70229 Atherosclerosis of native arteries of extremities with rest pain, unspecified extremity: Secondary | ICD-10-CM

## 2017-06-04 DIAGNOSIS — I998 Other disorder of circulatory system: Secondary | ICD-10-CM

## 2017-06-04 DIAGNOSIS — I739 Peripheral vascular disease, unspecified: Secondary | ICD-10-CM

## 2017-06-04 MED ORDER — IOPAMIDOL (ISOVUE-370) INJECTION 76%
125.0000 mL | Freq: Once | INTRAVENOUS | Status: AC | PRN
  Administered 2017-06-04: 125 mL via INTRAVENOUS

## 2017-06-08 ENCOUNTER — Encounter: Payer: Self-pay | Admitting: *Deleted

## 2017-06-08 ENCOUNTER — Encounter: Payer: Self-pay | Admitting: Vascular Surgery

## 2017-06-08 ENCOUNTER — Other Ambulatory Visit: Payer: Self-pay | Admitting: *Deleted

## 2017-06-08 ENCOUNTER — Ambulatory Visit (INDEPENDENT_AMBULATORY_CARE_PROVIDER_SITE_OTHER): Payer: Medicare Other | Admitting: Vascular Surgery

## 2017-06-08 VITALS — BP 134/79 | HR 80 | Temp 99.8°F | Resp 16 | Ht 65.0 in | Wt 164.0 lb

## 2017-06-08 DIAGNOSIS — I998 Other disorder of circulatory system: Secondary | ICD-10-CM

## 2017-06-08 DIAGNOSIS — I70229 Atherosclerosis of native arteries of extremities with rest pain, unspecified extremity: Secondary | ICD-10-CM

## 2017-06-08 NOTE — Progress Notes (Signed)
Patient ID: Brenda [name], female   DOB: Apr 01, 1956, 61 y.o.   MRN: [id number]  Reason for Consult: Ischemia (2 wk f/u. )   Referred by [name], *  Subjective:     HPI:  Brenda [name] is a 61 y.o. female returns for left lotion rest pain and right great toe ulceration. She has a history of coronary artery disease status post bypass. She is a former smoker but quit many years ago. She does take aspirin daily as well as statin drugs. She continues to have significant pain specifically at rest although she is going to the [facility] to work out. She had CT angiogram performed the other day prior to this visit.  Past Medical History:  Diagnosis Date  . Cancer [facility])    breast cancer  . Carotid artery stenosis   . CHF (congestive heart failure) ([place])   . Coronary artery disease   . Depression   . Hypercholesteremia   . Hypertension   . Hyperthyroidism   . Stroke [facility])    Family History  Problem Relation Age of Onset  . Lung cancer Mother        died at an early age  . Cancer Mother        lung  . Other Unknown        There is no Hx of premature coronary artery disease in her family   Past Surgical History:  Procedure Laterality Date  . ABDOMINAL HYSTERECTOMY    . BREAST LUMPECTOMY  1999  . BREAST LUMPECTOMY     left breast  . CARDIAC SURGERY    . CAROTID ENDARTERECTOMY  04-2000   left  . CORONARY ARTERY BYPASS GRAFT  2001   x 5 (left internal mammary artery to left anterior  descending coronary artery    Short Social History:  Social History  Substance Use Topics  . Smoking status: Former Smoker    Types: Cigarettes    Quit date: 09/11/2001  . Smokeless tobacco: Never Used  . Alcohol use No    Allergies  Allergen Reactions  . Latex     unknown    Current Outpatient Prescriptions  Medication Sig Dispense Refill  . aspirin 81 MG tablet Take 81 mg by mouth daily.      . Cholecalciferol (VITAMIN D) 1000 UNITS capsule Take 2,000 Units by mouth daily.     .  ciprofloxacin (CIPRO) 500 MG tablet Take 1 tablet (500 mg total) by mouth 2 (two) times daily. 20 tablet 0  . doxycycline (VIBRA-TABS) 100 MG tablet Take 1 tablet (100 mg total) by mouth 2 (two) times daily. 60 tablet 0  . gabapentin (NEURONTIN) 300 MG capsule Take 1 capsule by mouth at night for 7 nights and then 1 in the morning and one at night for 7 days and then 3 times a day. 90 capsule 0  . HYDROcodone-acetaminophen (NORCO/VICODIN) 5-325 MG tablet Take 1 tablet by mouth every 4 (four) hours as needed for moderate pain. 30 tablet 0  . hydrocortisone 2.5 % cream Apply topically 2 (two) times daily. Apply sparingly for maximum 7 days 28 g 0  . lisinopril-hydrochlorothiazide (PRINZIDE,ZESTORETIC) 20-25 MG tablet Take 2 tablets by mouth daily. 180 tablet 1  . metoprolol succinate (TOPROL-XL) 100 MG 24 hr tablet TAKE 1 TABLET BY MOUTH ONCE DAILY WITH  OR  IMMEDIATELY  FOLLOWING  A  MEAL 90 tablet 3  . Multiple Vitamin (MULTIVITAMIN) tablet Take 1 tablet by mouth daily.      Marland Kitchen  naproxen (NAPROSYN) 500 MG tablet Take 1 tablet (500 mg total) by mouth 2 (two) times daily with a meal. 20 tablet 0  . nitroGLYCERIN (NITROSTAT) 0.4 MG SL tablet Place 1 tablet (0.4 mg total) under the tongue every 5 (five) minutes as needed for chest pain (x 3 tabs daily). 20 tablet 1  . rosuvastatin (CRESTOR) 40 MG tablet Take 1 tablet (40 mg total) by mouth daily. 90 tablet 3  . spironolactone (ALDACTONE) 25 MG tablet Take 2 tablets (50 mg total) by mouth daily. 60 tablet 3  . traMADol (ULTRAM) 50 MG tablet Take 1 tablet (50 mg total) by mouth every 8 (eight) hours as needed for moderate pain or severe pain. 40 tablet 0  . venlafaxine XR (EFFEXOR XR) 37.5 MG 24 hr capsule Take 1 capsule (37.5 mg total) by mouth daily with breakfast. 30 capsule 1  . amLODipine (NORVASC) 10 MG tablet Take 1 tablet (10 mg total) by mouth daily. 30 tablet 5   No current facility-administered medications for this visit.     Review of Systems    Constitutional:  Constitutional negative. HENT: HENT negative.  Eyes: Eyes negative.  Respiratory: Respiratory negative.  Cardiovascular: Cardiovascular negative.  GI: Gastrointestinal negative.  Musculoskeletal: Positive for leg pain.  Skin: Positive for wound.  Neurological: Neurological negative. Hematologic: Hematologic/lymphatic negative.  Psychiatric: Psychiatric negative.        Objective:  Objective   There were no vitals filed for this visit. There is no height or weight on file to calculate BMI.  Physical Exam  Constitutional: She is oriented to person, place, and time. She appears well-developed.  HENT:  Head: Normocephalic.  Eyes: Pupils are equal, round, and reactive to light.  Neck: Normal range of motion.  Cardiovascular: Normal rate.   Pulses:      Femoral pulses are 0 on the right side, and 0 on the left side. Abdominal: Soft. She exhibits no mass.  Musculoskeletal: She exhibits no edema.  Neurological: She is alert and oriented to person, place, and time.  Skin: Skin is warm.  Psychiatric: She has a normal mood and affect. Her behavior is normal. Judgment and thought content normal.    Data: IMPRESSION: VASCULAR  1. Left lower extremity peripheral arterial disease including chronic occlusion of the left common iliac artery with distal reconstitution just proximal to the common iliac bifurcation and colonic occlusion of the anterior tibial artery beginning in the mid calf. There is patent 2 vessel runoff to the foot in the form of the peroneal and posterior tibial arteries. 2. Right lower extremity peripheral arterial disease including moderate and severe stenoses of the right common iliac artery secondary to calcified atherosclerotic plaque. Patent 3 vessel runoff to the foot. 3. 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 4. Mild to moderate stenosis of the proximal superior  mesenteric artery. 5. Moderate to high-grade stenosis of the origin of the more superior of the 2 right-sided renal arteries. There is associated renal cortical atrophy of the upper and interpolar regions of the right kidney. 6.  Aortic Atherosclerosis (ICD10-170.0) 7. Variant hepatic arterial anatomy noted incidentally. 8. Cardiomegaly.     Assessment/Plan:     61 year old female with right great toe ulceration and left lower extremity rest pain found to have significant aortoiliac disease in only the distal aorta common iliac arteries. She does have minimal lower extremity arterial disease. She also was found to have a lesion in her pancreas which I  will refer her to Dr. Barry Dienes for further evaluation. From a vascular standpoint we will proceed with aortogram possible bilateral iliac artery stenting for bilateral femoral approach which I discussed the risk benefits and alternatives today with her. She demonstrates very good understanding and will get her scheduled today. If we have failed stenting of the iliacs she will need consideration of aortobifemoral bypass and will need cardiac clearance by Dr. Stanford Breed prior to this.     Waynetta Sandy MD [facility]

## 2017-06-11 ENCOUNTER — Other Ambulatory Visit: Payer: Self-pay | Admitting: Vascular Surgery

## 2017-06-13 ENCOUNTER — Encounter (HOSPITAL_COMMUNITY): Payer: Self-pay | Admitting: *Deleted

## 2017-06-13 ENCOUNTER — Ambulatory Visit (HOSPITAL_COMMUNITY)
Admission: RE | Disposition: A | Discharge status: Home / Self Care | Payer: Self-pay | Source: Ambulatory Visit | Attending: Vascular Surgery

## 2017-06-13 ENCOUNTER — Ambulatory Visit (HOSPITAL_COMMUNITY)
Admission: RE | Admit: 2017-06-13 | Discharge: 2017-06-13 | Disposition: A | Discharge status: Home / Self Care | Payer: Medicare Other | Source: Ambulatory Visit | Attending: Vascular Surgery | Admitting: Vascular Surgery

## 2017-06-13 DIAGNOSIS — Z79899 Other long term (current) drug therapy: Secondary | ICD-10-CM | POA: Insufficient documentation

## 2017-06-13 DIAGNOSIS — Z853 Personal history of malignant neoplasm of breast: Secondary | ICD-10-CM | POA: Diagnosis not present

## 2017-06-13 DIAGNOSIS — I2510 Atherosclerotic heart disease of native coronary artery without angina pectoris: Secondary | ICD-10-CM | POA: Insufficient documentation

## 2017-06-13 DIAGNOSIS — I509 Heart failure, unspecified: Secondary | ICD-10-CM | POA: Insufficient documentation

## 2017-06-13 DIAGNOSIS — Z8673 Personal history of transient ischemic attack (TIA), and cerebral infarction without residual deficits: Secondary | ICD-10-CM | POA: Insufficient documentation

## 2017-06-13 DIAGNOSIS — I110 Hypertensive heart disease with heart failure: Secondary | ICD-10-CM | POA: Diagnosis not present

## 2017-06-13 DIAGNOSIS — Z7982 Long term (current) use of aspirin: Secondary | ICD-10-CM | POA: Diagnosis not present

## 2017-06-13 DIAGNOSIS — I70235 Atherosclerosis of native arteries of right leg with ulceration of other part of foot: Secondary | ICD-10-CM | POA: Insufficient documentation

## 2017-06-13 DIAGNOSIS — Z951 Presence of aortocoronary bypass graft: Secondary | ICD-10-CM | POA: Diagnosis not present

## 2017-06-13 DIAGNOSIS — L97519 Non-pressure chronic ulcer of other part of right foot with unspecified severity: Secondary | ICD-10-CM | POA: Diagnosis not present

## 2017-06-13 DIAGNOSIS — I7092 Chronic total occlusion of artery of the extremities: Secondary | ICD-10-CM | POA: Insufficient documentation

## 2017-06-13 DIAGNOSIS — E0590 Thyrotoxicosis, unspecified without thyrotoxic crisis or storm: Secondary | ICD-10-CM | POA: Insufficient documentation

## 2017-06-13 DIAGNOSIS — E7800 Pure hypercholesterolemia, unspecified: Secondary | ICD-10-CM | POA: Insufficient documentation

## 2017-06-13 DIAGNOSIS — I70202 Unspecified atherosclerosis of native arteries of extremities, left leg: Secondary | ICD-10-CM | POA: Diagnosis not present

## 2017-06-13 DIAGNOSIS — Z91040 Latex allergy status: Secondary | ICD-10-CM | POA: Diagnosis not present

## 2017-06-13 DIAGNOSIS — I739 Peripheral vascular disease, unspecified: Secondary | ICD-10-CM | POA: Diagnosis present

## 2017-06-13 DIAGNOSIS — I70203 Unspecified atherosclerosis of native arteries of extremities, bilateral legs: Secondary | ICD-10-CM | POA: Diagnosis not present

## 2017-06-13 HISTORY — PX: ABDOMINAL AORTOGRAM W/LOWER EXTREMITY: CATH118223

## 2017-06-13 HISTORY — PX: PERIPHERAL VASCULAR INTERVENTION: CATH118257

## 2017-06-13 LAB — POCT I-STAT, CHEM 8
BUN: 13 mg/dL (ref 6–20)
Calcium, Ion: 1.15 mmol/L (ref 1.15–1.40)
Chloride: 105 mmol/L (ref 101–111)
Creatinine, Ser: 1 mg/dL (ref 0.44–1.00)
Glucose, Bld: 92 mg/dL (ref 65–99)
HCT: 37 % (ref 36.0–46.0)
Hemoglobin: 12.6 g/dL (ref 12.0–15.0)
Potassium: 3.6 mmol/L (ref 3.5–5.1)
Sodium: 141 mmol/L (ref 135–145)
TCO2: 24 mmol/L (ref 22–32)

## 2017-06-13 LAB — POCT ACTIVATED CLOTTING TIME
Activated Clotting Time: 224 seconds
Activated Clotting Time: 208 seconds
Activated Clotting Time: 180 seconds

## 2017-06-13 SURGERY — ABDOMINAL AORTOGRAM W/LOWER EXTREMITY
Anesthesia: LOCAL

## 2017-06-13 MED ORDER — SODIUM CHLORIDE 0.9 % IV SOLN
INTRAVENOUS | Status: DC
  Administered 2017-06-13: 12:00:00 via INTRAVENOUS

## 2017-06-13 MED ORDER — HYDROMORPHONE HCL 1 MG/ML IJ SOLN
0.5000 mg | Freq: Once | INTRAMUSCULAR | Status: AC
  Administered 2017-06-13: 0.5 mg via INTRAVENOUS

## 2017-06-13 MED ORDER — FENTANYL CITRATE (PF) 100 MCG/2ML IJ SOLN
INTRAMUSCULAR | Status: DC | PRN
  Administered 2017-06-13: 25 ug via INTRAVENOUS
  Administered 2017-06-13: 50 ug via INTRAVENOUS

## 2017-06-13 MED ORDER — CLOPIDOGREL BISULFATE 75 MG PO TABS: 75.0000 mg | ORAL_TABLET | Freq: Every day | ORAL | 3 refills | Status: DC

## 2017-06-13 MED ORDER — HEPARIN SODIUM (PORCINE) 1000 UNIT/ML IJ SOLN
INTRAMUSCULAR | Status: DC | PRN
  Administered 2017-06-13: 8000 [IU] via INTRAVENOUS

## 2017-06-13 MED ORDER — LABETALOL HCL 5 MG/ML IV SOLN: 10.0000 mg | INTRAVENOUS | Status: DC | PRN

## 2017-06-13 MED ORDER — HYDRALAZINE HCL 20 MG/ML IJ SOLN
5.0000 mg | INTRAMUSCULAR | Status: DC | PRN
  Administered 2017-06-13: 5 mg via INTRAVENOUS

## 2017-06-13 MED ORDER — MIDAZOLAM HCL 2 MG/2ML IJ SOLN
INTRAMUSCULAR | Status: AC
  Filled 2017-06-13: qty 2

## 2017-06-13 MED ORDER — SODIUM CHLORIDE 0.9 % WEIGHT BASED INFUSION: 1.0000 mL/kg/h | INTRAVENOUS | Status: DC

## 2017-06-13 MED ORDER — OXYCODONE HCL 5 MG PO TABS
5.0000 mg | ORAL_TABLET | ORAL | Status: DC | PRN
  Administered 2017-06-13: 5 mg via ORAL

## 2017-06-13 MED ORDER — HEPARIN (PORCINE) IN NACL 2-0.9 UNIT/ML-% IJ SOLN
INTRAMUSCULAR | Status: AC
  Filled 2017-06-13: qty 1000

## 2017-06-13 MED ORDER — LIDOCAINE HCL (PF) 1 % IJ SOLN
INTRAMUSCULAR | Status: DC | PRN
  Administered 2017-06-13 (×2): 10 mL

## 2017-06-13 MED ORDER — OXYCODONE HCL 5 MG PO TABS
ORAL_TABLET | ORAL | Status: AC
  Filled 2017-06-13: qty 1

## 2017-06-13 MED ORDER — FENTANYL CITRATE (PF) 100 MCG/2ML IJ SOLN
INTRAMUSCULAR | Status: AC
  Filled 2017-06-13: qty 2

## 2017-06-13 MED ORDER — CLOPIDOGREL BISULFATE 75 MG PO TABS: 75.0000 mg | ORAL_TABLET | Freq: Every day | ORAL | Status: DC

## 2017-06-13 MED ORDER — CLOPIDOGREL BISULFATE 300 MG PO TABS
ORAL_TABLET | ORAL | Status: AC
  Filled 2017-06-13: qty 1

## 2017-06-13 MED ORDER — HYDROMORPHONE HCL 1 MG/ML IJ SOLN
INTRAMUSCULAR | Status: AC
  Filled 2017-06-13: qty 0.5

## 2017-06-13 MED ORDER — HEPARIN (PORCINE) IN NACL 2-0.9 UNIT/ML-% IJ SOLN
INTRAMUSCULAR | Status: AC | PRN
  Administered 2017-06-13: 1000 mL

## 2017-06-13 MED ORDER — HYDRALAZINE HCL 20 MG/ML IJ SOLN
INTRAMUSCULAR | Status: AC
  Filled 2017-06-13: qty 1

## 2017-06-13 MED ORDER — SODIUM CHLORIDE 0.9% FLUSH: 3.0000 mL | Freq: Two times a day (BID) | INTRAVENOUS | Status: DC

## 2017-06-13 MED ORDER — SODIUM CHLORIDE 0.9% FLUSH: 3.0000 mL | INTRAVENOUS | Status: DC | PRN

## 2017-06-13 MED ORDER — CLOPIDOGREL BISULFATE 75 MG PO TABS
300.0000 mg | ORAL_TABLET | Freq: Once | ORAL | Status: AC
  Administered 2017-06-13: 300 mg via ORAL

## 2017-06-13 MED ORDER — SODIUM CHLORIDE 0.9 % IV SOLN: 250.0000 mL | INTRAVENOUS | Status: DC | PRN

## 2017-06-13 MED ORDER — IODIXANOL 320 MG/ML IV SOLN
INTRAVENOUS | Status: DC | PRN
  Administered 2017-06-13: 175 mL via INTRA_ARTERIAL

## 2017-06-13 MED ORDER — MIDAZOLAM HCL 2 MG/2ML IJ SOLN
INTRAMUSCULAR | Status: DC | PRN
  Administered 2017-06-13: 1 mg via INTRAVENOUS

## 2017-06-13 MED ORDER — LIDOCAINE HCL 2 % IJ SOLN
INTRAMUSCULAR | Status: AC
  Filled 2017-06-13: qty 10

## 2017-06-13 SURGICAL SUPPLY — 21 items
CATH ANGIO 5F BER2 65CM (CATHETERS) ×3
CATH OMNI FLUSH 5F 65CM (CATHETERS) ×3
CATH SOFT-VU 4F 65 STRAIGHT (CATHETERS) ×2
CATH SOFT-VU STRAIGHT 4F 65CM (CATHETERS) ×3
COVER PRB 48X5XTLSCP FOLD TPE (BAG) ×2
COVER PROBE 5X48 (BAG) ×3
DEVICE TORQUE .025-.038 (MISCELLANEOUS) ×3
GUIDEWIRE ANGLED .035X150CM (WIRE) ×3
KIT ENCORE 26 ADVANTAGE (KITS) ×6
KIT MICROINTRODUCER STIFF 5F (SHEATH) ×3
KIT PV (KITS) ×3
SHEATH BRITE TIP 7FR 35CM (SHEATH) ×6
SHEATH PINNACLE 5F 10CM (SHEATH) ×3
STENT VIABAHNBX 8X39X135 (Permanent Stent) ×6 IMPLANT
SYR MEDRAD MARK V 150ML (SYRINGE) ×3
TRANSDUCER W/STOPCOCK (MISCELLANEOUS) ×3
TRAY PV CATH (CUSTOM PROCEDURE TRAY) ×3
WIRE BENTSON .035X145CM (WIRE) ×3
WIRE HI TORQ VERSACORE J 260CM (WIRE) ×3
WIRE ROSEN-J .035X260CM (WIRE) ×3
WIRE TORQFLEX AUST .018X40CM (WIRE) ×3

## 2017-06-13 NOTE — Discharge Instructions (Signed)
Angiogram, Care After °This sheet gives you information about how to care for yourself after your procedure. Your health care provider may also give you more specific instructions. If you have problems or questions, contact your health care provider. °What can I expect after the procedure? °After the procedure, it is common to have bruising and tenderness at the catheter insertion area. °Follow these instructions at home: °Insertion site care  °· Follow instructions from your health care provider about how to take care of your insertion site. Make sure you: °¨ Wash your hands with soap and water before you change your bandage (dressing). If soap and water are not available, use hand sanitizer. °¨ Change your dressing as told by your health care provider. °¨ Leave stitches (sutures), skin glue, or adhesive strips in place. These skin closures may need to stay in place for 2 weeks or longer. If adhesive strip edges start to loosen and curl up, you may trim the loose edges. Do not remove adhesive strips completely unless your health care provider tells you to do that. °· Do not take baths, swim, or use a hot tub until your health care provider approves. °· You may shower 24-48 hours after the procedure or as told by your health care provider. °¨ Gently wash the site with plain soap and water. °¨ Pat the area dry with a clean towel. °¨ Do not rub the site. This may cause bleeding. °· Do not apply powder or lotion to the site. Keep the site clean and dry. °· Check your insertion site every day for signs of infection. Check for: °¨ Redness, swelling, or pain. °¨ Fluid or blood. °¨ Warmth. °¨ Pus or a bad smell. °Activity  °· Rest as told by your health care provider, usually for 1-2 days. °· Do not lift anything that is heavier than 10 lbs. (4.5 kg) or as told by your health care provider. °· Do not drive for 24 hours if you were given a medicine to help you relax (sedative). °· Do not drive or use heavy machinery while  taking prescription pain medicine. °General instructions  °· Return to your normal activities as told by your health care provider, usually in about a week. Ask your health care provider what activities are safe for you. °· If the catheter site starts bleeding, lie flat and put pressure on the site. If the bleeding does not stop, get help right away. This is a medical emergency. °· Drink enough fluid to keep your urine clear or pale yellow. This helps flush the contrast dye from your body. °· Take over-the-counter and prescription medicines only as told by your health care provider. °· Keep all follow-up visits as told by your health care provider. This is important. °Contact a health care provider if: °· You have a fever or chills. °· You have redness, swelling, or pain around your insertion site. °· You have fluid or blood coming from your insertion site. °· The insertion site feels warm to the touch. °· You have pus or a bad smell coming from your insertion site. °· You have bruising around the insertion site. °· You notice blood collecting in the tissue around the catheter site (hematoma). The hematoma may be painful to the touch. °Get help right away if: °· You have severe pain at the catheter insertion area. °· The catheter insertion area swells very fast. °· The catheter insertion area is bleeding, and the bleeding does not stop when you hold steady pressure on   the area. °· The area near or just beyond the catheter insertion site becomes pale, cool, tingly, or numb. °These symptoms may represent a serious problem that is an emergency. Do not wait to see if the symptoms will go away. Get medical help right away. Call your local emergency services (911 in the [place] Do not drive yourself to the hospital. °Summary °· After the procedure, it is common to have bruising and tenderness at the catheter insertion area. °· After the procedure, it is important to rest and drink plenty of fluids. °· Do not take baths,  swim, or use a hot tub until your health care provider says it is okay to do so. You may shower 24-48 hours after the procedure or as told by your health care provider. °· If the catheter site starts bleeding, lie flat and put pressure on the site. If the bleeding does not stop, get help right away. This is a medical emergency. °This information is not intended to replace advice given to you by your health care provider. Make sure you discuss any questions you have with your health care provider. °Document Released: 03/16/2005 Document Revised: 08/02/2016 Document Reviewed: 08/02/2016 °Elsevier Interactive Patient Education © 2017 [employer] ° °

## 2017-06-13 NOTE — H&P (Signed)
   History and Physical Update  The patient was interviewed and re-examined.  The patient's previous History and Physical has been reviewed and is unchanged from previous. Plan aortogram with bilateral iliac artery stenting.  Brandon C. Donzetta Matters, MD [facility] [place] Office: [phone] Pager: [phone]   06/13/2017, 12:47 PM

## 2017-06-13 NOTE — Progress Notes (Signed)
7 fr sheath removed from rfa- manual pressure held x 30 min, hemostasis achieved with manual pressure. R pt pre sheath removal with doppler and r pt post sheath removal remains intact via doppler. Brenda [name] with 7 fr sheath removal at the lfa. Both site (rfa and left) level 0 prior to removal. 7 fr sheath removal on the lfa- manual pressure held x 30 min with hemostasis achieve. Pt Hr remained 80-90 b/p 476-546 systolic- throughout sheath pull. Pt. Rated back pain improved at "5/10". Pt given instructions to hold bilateral pressure to rfa/lfa if she laughs, coughs, sneezes, or clears throat. Pt. r hand and l hand guided to bilateral dressing. Pt. Advised if she feels something wet and warm between her legs that means she is bleeding and to call for help right away and hold pressure. Pt acknowledges understanding as she repeats them back to me.

## 2017-06-13 NOTE — Op Note (Signed)
    Patient name: Brenda [name] MRN: [id number] DOB: 09-06-1956 Sex: female  06/13/2017 Pre-operative Diagnosis: bilateral common iliac occlusive disease Post-operative diagnosis:  Same Surgeon:  Erlene Quan C. Donzetta Matters, MD Procedure Performed: 1.  US guided cannulation of bilateral common femoral arteries 2.  Aortogram with bilateral lower extremity runoff 3.  Stenting of bilateral common iliac arteries with 8 x 39 vbx 4.  Moderate sedation with fentanyl and versed for 51 minutes  Indications:  61 year old female with a right toe ulceration and found to have bilateral common iliac artery occlusive disease. She is now indicated for aortogram possible intervention of the common iliac arteries and evaluation of the bilateral lower extremity.  Findings: The right common iliac artery was subtotally occluded the left common iliac artery had a short segment occlusion. After intraluminal recanalization we then placed bilateral iliac artery stents and then had 0% residual stenosis although there is heavy calcification of the distal aorta. Runoff bilaterally there is minimal stenosis of the superficial femoral arteries the tibials are inadequately evaluated although there does appear to be runoff to at least the ankles bilaterally.   Procedure:  The patient was identified in the holding area and taken to room 8.  The patient was then placed supine on the table and prepped and draped in the usual sterile fashion.  A time out was called.  Ultrasound was used to evaluate the right common femoral artery.  It was patent .  A digital ultrasound image was acquired.  A micropuncture needle was used to access the right common femoral artery under ultrasound guidance.  An 018 wire was advanced without resistance and a micropuncture sheath was placed.  The 018 wire was removed and a benson wire was placed.  The micropuncture sheath was exchanged for a 5 french sheath.  A Glidewire Bercatheter were used to cross to literally into  the aorta and we confirmed this with fluoroscopy. We then exchanged for an Omni catheter performed aortogram demonstrated the above aortoiliac occlusive disease. We then exchanged on the right over a stiff wire for long 7 French sheath. Ultrasound was then used to cannulate the left common femoral artery with micropuncture kit and we placed a long 7 French sheath there as well. We then cross the occluded iliac artery on the left using bare catheter and Glidewire and again confirmed intraluminal access. She was heparinized with 8000 unites of heparin. We then changed for stiff wire we able to cross both lesions with the 7 French sheath. We then brought bilateral VBX stents into the iliac arteries and an aortogram again was performed demonstrating bilateral hypogastric arteries that were patent. We then inflated our stents to profile bilaterally. The balloons were then removed and completion aortogram demonstrated 0% residual stenosis although heavy calcification of the distal aorta bilateral internal iliac arteries did fill and there is brisk runoff to the groins. We then performed bilateral lower extremity runoff which did inadequately visualized the tibial vessels. Satisfied with this we then removed our catheters and wires and retracted our sheath into the external iliac arteries bilaterally. These will be pulled when ACT returned less than 180. Patient tolerated procedure well without immediate complication.  Should the stents have issues she should be considered for aortobifemoral bypass.  Contrast: 175cc  Brandon C. Donzetta Matters, MD [facility] [place] Office: [phone] Pager: [phone]

## 2017-06-14 ENCOUNTER — Telehealth: Payer: Self-pay | Admitting: Vascular Surgery

## 2017-06-14 ENCOUNTER — Telehealth: Payer: Self-pay | Admitting: *Deleted

## 2017-06-14 ENCOUNTER — Encounter (HOSPITAL_COMMUNITY): Payer: Self-pay | Admitting: Vascular Surgery

## 2017-06-14 ENCOUNTER — Encounter: Payer: Self-pay | Admitting: Vascular Surgery

## 2017-06-14 ENCOUNTER — Other Ambulatory Visit: Payer: Self-pay

## 2017-06-14 DIAGNOSIS — I745 Embolism and thrombosis of iliac artery: Secondary | ICD-10-CM

## 2017-06-14 DIAGNOSIS — Z48812 Encounter for surgical aftercare following surgery on the circulatory system: Secondary | ICD-10-CM

## 2017-06-14 NOTE — Telephone Encounter (Signed)
-----   Message from Mena Goes, RN sent at 06/13/2017  3:52 PM EDT ----- Regarding: 4-6 weeks w/ labs   ----- Message ----- From: Waynetta Sandy, MD Sent: 06/13/2017   3:42 PM To: Vvs Charge Pool  Brenda [name] [id number] October 06, 1955  06/13/2017 Pre-operative Diagnosis: bilateral common iliac occlusive disease  Surgeon:  Erlene Quan C. Donzetta Matters, MD  Procedure Performed: 1.  US guided cannulation of bilateral common femoral arteries 2.  Aortogram with bilateral lower extremity runoff 3.  Stenting of bilateral common iliac arteries with 8 x 39 vbx 4.  Moderate sedation with fentanyl and versed for 51 minutes  F/u in 4- 6 weeks with aortoiliac artery duplex and ABI

## 2017-06-14 NOTE — Telephone Encounter (Signed)
Sched labs 07/26/17 at 8:00 and MD 07/27/17 at 11:15. Spoke to pt.

## 2017-06-14 NOTE — Telephone Encounter (Signed)
Patient called complaining of left foot tingling and numbness, denying discoloration.  She states that she is feeling a tightness behind the thigh (left).  An appointment was scheduled for 06/15/2017 at 2:00.

## 2017-06-15 ENCOUNTER — Encounter: Payer: Self-pay | Admitting: Vascular Surgery

## 2017-06-15 ENCOUNTER — Ambulatory Visit (INDEPENDENT_AMBULATORY_CARE_PROVIDER_SITE_OTHER): Payer: Medicare Other | Admitting: Vascular Surgery

## 2017-06-15 VITALS — BP 132/74 | HR 76 | Temp 99.0°F | Resp 14 | Ht 65.0 in | Wt 162.0 lb

## 2017-06-15 DIAGNOSIS — I739 Peripheral vascular disease, unspecified: Secondary | ICD-10-CM | POA: Diagnosis not present

## 2017-06-15 MED FILL — Clopidogrel Bisulfate Tab 300 MG (Base Equiv): ORAL | Qty: 1 | Status: AC

## 2017-06-15 MED FILL — Lidocaine HCl Local Inj 2%: INTRAMUSCULAR | Qty: 10 | Status: AC

## 2017-06-15 NOTE — Progress Notes (Signed)
Patient ID: Brenda [name], female   DOB: 04/01/1956, 61 y.o.   MRN: [id number]  Reason for Consult: Ischemia (left leg pain and bilteral foot pain)   Referred by [name], *  Subjective:     HPI:  Brenda [name] is a 61 y.o. female is status post bilateral iliac stenting was done for wound on her right foot as well as left lower extremity rest pain. She now presents with some numbness and tingling of her left foot. She has not had any swelling she is generally doing well and is back to walking her groins are doing fine without any swelling there either. Other than numbness in her left foot she is not having any issues.  Past Medical History:  Diagnosis Date  . Cancer [facility])    breast cancer  . Carotid artery stenosis   . CHF (congestive heart failure) ([place])   . Coronary artery disease   . Depression   . Hypercholesteremia   . Hypertension   . Hyperthyroidism   . Stroke [facility])    Family History  Problem Relation Age of Onset  . Lung cancer Mother        died at an early age  . Cancer Mother        lung  . Other Unknown        There is no Hx of premature coronary artery disease in her family   Past Surgical History:  Procedure Laterality Date  . ABDOMINAL AORTOGRAM W/LOWER EXTREMITY N/A 06/13/2017   Procedure: ABDOMINAL AORTOGRAM W/LOWER EXTREMITY;  Surgeon: Waynetta Sandy, MD;  Location: [place] [facility];  Service: Cardiovascular;  Laterality: N/A;  Bilateral  . ABDOMINAL HYSTERECTOMY    . BREAST LUMPECTOMY  1999  . BREAST LUMPECTOMY     left breast  . CARDIAC SURGERY    . CAROTID ENDARTERECTOMY  04-2000   left  . CORONARY ARTERY BYPASS GRAFT  2001   x 5 (left internal mammary artery to left anterior  descending coronary artery  . PERIPHERAL VASCULAR INTERVENTION  06/13/2017   Procedure: PERIPHERAL VASCULAR INTERVENTION;  Surgeon: Waynetta Sandy, MD;  Location: [place] [facility];  Service: Cardiovascular;;  Bilateral Iliac Stents     Short Social History:  Social History  Substance Use Topics  . Smoking status: Former Smoker    Types: Cigarettes    Quit date: 09/11/2001  . Smokeless tobacco: Never Used  . Alcohol use No    Allergies  Allergen Reactions  . Latex     unknown    Current Outpatient Prescriptions  Medication Sig Dispense Refill  . amLODipine (NORVASC) 10 MG tablet Take 10 mg by mouth daily.    Marland Kitchen aspirin 81 MG tablet Take 81 mg by mouth daily.      . Cholecalciferol (VITAMIN D) 1000 UNITS capsule Take 2,000 Units by mouth daily.     . clopidogrel (PLAVIX) 75 MG tablet Take 1 tablet (75 mg total) by mouth daily. 30 tablet 3  . gabapentin (NEURONTIN) 300 MG capsule Take 1 capsule by mouth at night for 7 nights and then 1 in the morning and one at night for 7 days and then 3 times a day. 90 capsule 0  . lisinopril-hydrochlorothiazide (PRINZIDE,ZESTORETIC) 20-25 MG tablet Take 2 tablets by mouth daily. 180 tablet 1  . metoprolol succinate (TOPROL-XL) 100 MG 24 hr tablet TAKE 1 TABLET BY MOUTH ONCE DAILY WITH  OR  IMMEDIATELY  FOLLOWING  A  MEAL 90 tablet 3  .  Multiple Vitamin (MULTIVITAMIN) tablet Take 1 tablet by mouth daily.      . nitroGLYCERIN (NITROSTAT) 0.4 MG SL tablet Place 1 tablet (0.4 mg total) under the tongue every 5 (five) minutes as needed for chest pain (x 3 tabs daily). 20 tablet 1  . rosuvastatin (CRESTOR) 40 MG tablet Take 1 tablet (40 mg total) by mouth daily. 90 tablet 3  . spironolactone (ALDACTONE) 25 MG tablet Take 2 tablets (50 mg total) by mouth daily. 60 tablet 3  . venlafaxine XR (EFFEXOR XR) 37.5 MG 24 hr capsule Take 1 capsule (37.5 mg total) by mouth daily with breakfast. 30 capsule 1  . ciprofloxacin (CIPRO) 500 MG tablet Take 1 tablet (500 mg total) by mouth 2 (two) times daily. (Patient not taking: Reported on 06/15/2017) 20 tablet 0  . HYDROcodone-acetaminophen (NORCO/VICODIN) 5-325 MG tablet Take 1 tablet by mouth every 4 (four) hours as needed for moderate pain.  (Patient not taking: Reported on 06/15/2017) 30 tablet 0  . hydrocortisone 2.5 % cream Apply topically 2 (two) times daily. Apply sparingly for maximum 7 days (Patient not taking: Reported on 06/15/2017) 28 g 0   No current facility-administered medications for this visit.     Review of Systems  Constitutional:  Constitutional negative. Cardiovascular: Cardiovascular negative.  GI: Gastrointestinal negative.  Musculoskeletal: Positive for leg pain.  Skin: Positive for wound.  Neurological: Positive for numbness.  Hematologic: Hematologic/lymphatic negative.  Psychiatric: Psychiatric negative.        Objective:  Objective   Vitals:   06/15/17 1414  BP: 132/74  Pulse: 76  Resp: 14  Temp: 99 F (37.2 C)  SpO2: 97%  Weight: 162 lb (73.5 kg)  Height: 5\' 5"  (1.651 m)   Body mass index is 26.96 kg/m.  Physical Exam  Constitutional: She is oriented to person, place, and time. She appears well-developed.  Eyes: Pupils are equal, round, and reactive to light.  Neck: Normal range of motion.  Cardiovascular: Normal rate.   Pulses:      Femoral pulses are 2+ on the right side, and 2+ on the left side.      Posterior tibial pulses are 2+ on the right side, and 2+ on the left side.  Pulmonary/Chest: Effort normal.  Abdominal: Soft. She exhibits no mass.  Musculoskeletal: She exhibits no edema.  Neurological: She is alert and oriented to person, place, and time.  Skin:  Right great toe tip ulceration  Psychiatric: She has a normal mood and affect. Her behavior is normal. Judgment and thought content normal.    Data: No studies today     Assessment/Plan:    61 year old female status post bilateral iliac artery stenting. She has some numbness and tingling of her left foot otherwise having no issues. She has easily palpable posterior tibial pulses. Think she must have some element of reperfusion is no further ischemia. She will keep her regularly scheduled follow-up in a few weeks  with aortoiliac duplex and ABIs.      Waynetta Sandy MD [facility]

## 2017-06-19 ENCOUNTER — Encounter (HOSPITAL_COMMUNITY): Payer: Self-pay | Admitting: *Deleted

## 2017-06-19 ENCOUNTER — Ambulatory Visit (HOSPITAL_COMMUNITY)
Admission: EM | Admit: 2017-06-19 | Discharge: 2017-06-19 | Disposition: A | Discharge status: Home / Self Care | Payer: Medicare Other | Attending: Family Medicine | Admitting: Family Medicine

## 2017-06-19 ENCOUNTER — Ambulatory Visit (INDEPENDENT_AMBULATORY_CARE_PROVIDER_SITE_OTHER): Payer: Medicare Other

## 2017-06-19 ENCOUNTER — Ambulatory Visit (HOSPITAL_COMMUNITY): Payer: Medicare Other

## 2017-06-19 DIAGNOSIS — R197 Diarrhea, unspecified: Secondary | ICD-10-CM

## 2017-06-19 NOTE — ED Triage Notes (Signed)
Patient reports dark diarrhea, belching, and abdominal/back pain, intermittent in nature for the last week. Patient was in the hospital on the 3rd for stent placement in bilateral legs. States ultrasound 3 weeks ago revealed cyst on pancreas. Has taken pepto-misol to help.

## 2017-06-19 NOTE — Discharge Instructions (Signed)
It is not clear what is causing the diarrhea. Recommend not taking Pepto-Bismol but taking a fiber supplement. This may increase the bulk of the stool. If you are having abdominal distention or pain or not having normal bowel movements you may need to see your primary care provider immediately or go to emergency department depending on the amount of pain. Call your time care provider for an appointment either this afternoon or tomorrow for follow-up.

## 2017-06-19 NOTE — ED Provider Notes (Addendum)
[place]    CSN: [id number] Arrival date & time: 06/19/17  1152     History   Chief Complaint Chief Complaint  Patient presents with  . Diarrhea    HPI Brenda [name] is a 61 y.o. female.   61 year old female states that 2 weeks ago she had several episodes of diarrhea for a few days, took Pepto-Bismol and eventually abated. Her next sentence stated that she had pain across the lower back. Nothing seems to make it worse initially however when asked to bend forward and rotate her torso she experienced some low back pain. Yesterday she felt feverish around her neck and stomach but that also abated. This morning after 8 or 9:00 she had diarrhea, colored green and numbering 4 times today. She took Pepto-Bismol at 10:00 today. Denies known fever, nausea or vomiting. She is having increased belching. She tells me that recent ultrasound showed that she had a cyst on her pancreas. She also has embolic vascular disease and last week underwent iliac  endarterectomy.      Past Medical History:  Diagnosis Date  . Cancer [facility])    breast cancer  . Carotid artery stenosis   . CHF (congestive heart failure) ([place])   . Coronary artery disease   . Depression   . Hypercholesteremia   . Hypertension   . Hyperthyroidism   . Stroke [facility])     Patient Active Problem List   Diagnosis Date Noted  . Peripheral vascular disease ([place]) 06/01/2017  . Left leg pain 05/10/2017  . 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 Allergic rhinitis 02/25/2016  . Low back pain 09/29/2015  . Carpal tunnel syndrome, right 08/20/2015  . Hx of completed stroke 05/21/2015  . Memory impairment 05/21/2015  . Hyperthyroidism 12/29/2008  . HYPERCHOLESTEROLEMIA 12/29/2008  . DEPRESSION 12/29/2008  . Essential hypertension 12/29/2008  . Coronary atherosclerosis 12/29/2008  . CAROTID ARTERY STENOSIS 12/29/2008    Past Surgical History:  Procedure Laterality Date  . ABDOMINAL AORTOGRAM W/LOWER EXTREMITY N/A 06/13/2017   Procedure: ABDOMINAL AORTOGRAM W/LOWER EXTREMITY;  Surgeon: Waynetta Sandy, MD;  Location: [place] [facility];  Service: Cardiovascular;  Laterality: N/A;  Bilateral  . ABDOMINAL HYSTERECTOMY    . BREAST LUMPECTOMY  1999  . BREAST LUMPECTOMY     left breast  . CARDIAC SURGERY    . CAROTID ENDARTERECTOMY  04-2000   left  . CORONARY ARTERY BYPASS GRAFT  2001   x 5 (left internal mammary artery to left anterior  descending coronary artery  . PERIPHERAL VASCULAR INTERVENTION  06/13/2017   Procedure: PERIPHERAL VASCULAR INTERVENTION;  Surgeon: Waynetta Sandy, MD;  Location: [place] [facility];  Service: Cardiovascular;;  Bilateral Iliac Stents    OB History    No data available       Home Medications    Prior to Admission medications   Medication Sig Start Date End Date Taking? Authorizing Provider  amLODipine (NORVASC) 10 MG tablet Take 10 mg by mouth daily.    [provider]  aspirin 81 MG tablet Take 81 mg by mouth daily.      [provider]  Cholecalciferol (VITAMIN D) 1000 UNITS capsule Take 2,000 Units by mouth daily.     [provider]  clopidogrel (PLAVIX) 75 MG tablet Take 1 tablet (75 mg total) by mouth daily. 06/13/17 06/13/18  Waynetta Sandy, MD  gabapentin (NEURONTIN) 300 MG capsule Take 1 capsule by mouth at night for 7 nights and then 1 in the morning  and one at night for 7 days and then 3 times a day. 02/07/17   Magnus Sinning, MD  lisinopril-hydrochlorothiazide (PRINZIDE,ZESTORETIC) 20-25 MG tablet Take 2 tablets by mouth daily. 11/21/16   Lelon Perla, MD  metoprolol succinate (TOPROL-XL) 100 MG 24 hr tablet TAKE 1 TABLET BY MOUTH ONCE DAILY WITH  OR  IMMEDIATELY  FOLLOWING  A  MEAL 01/17/17   Lelon Perla, MD  Multiple Vitamin (MULTIVITAMIN) tablet Take 1 tablet by mouth daily.      [provider]  nitroGLYCERIN (NITROSTAT) 0.4 MG SL tablet Place 1 tablet (0.4 mg total) under the tongue every 5  (five) minutes as needed for chest pain (x 3 tabs daily). 08/20/15   [name], MD  rosuvastatin (CRESTOR) 40 MG tablet Take 1 tablet (40 mg total) by mouth daily. 06/18/14   Lelon Perla, MD  spironolactone (ALDACTONE) 25 MG tablet Take 2 tablets (50 mg total) by mouth daily. 01/04/17   Lelon Perla, MD  venlafaxine XR (EFFEXOR XR) 37.5 MG 24 hr capsule Take 1 capsule (37.5 mg total) by mouth daily with breakfast. 05/10/17   Lyndal Pulley, DO    Family History Family History  Problem Relation Age of Onset  . Lung cancer Mother        died at an early age  . Cancer Mother        lung  . Other Unknown        There is no Hx of premature coronary artery disease in her family    Social History Social History  Substance Use Topics  . Smoking status: Former Smoker    Types: Cigarettes    Quit date: 09/11/2001  . Smokeless tobacco: Never Used  . Alcohol use No     Allergies   Latex   Review of Systems Review of Systems  Constitutional: Positive for activity change. Negative for fever.  HENT: Negative.   Respiratory: Negative.   Cardiovascular: Negative.   Gastrointestinal: Positive for abdominal pain and diarrhea. Negative for nausea and vomiting.  Genitourinary: Negative.   Skin: Negative.   Neurological: Negative.   Psychiatric/Behavioral: Negative.   All other systems reviewed and are negative.    Physical Exam Triage Vital Signs ED Triage Vitals  Enc Vitals Group     BP 06/19/17 1218 (!) 144/99     Pulse Rate 06/19/17 1218 75     Resp 06/19/17 1218 16     Temp 06/19/17 1218 99.8 F (37.7 C)     Temp Source 06/19/17 1218 Oral     SpO2 06/19/17 1218 100 %     Weight --      Height --      Head Circumference --      Peak Flow --      Pain Score 06/19/17 1222 9     Pain Loc --      Pain Edu? --      Excl. in [place]? --    No data found.   Updated Vital Signs BP (!) 144/99 (BP Location: Right Arm)   Pulse 75   Temp 99.8 F (37.7 C)  (Oral)   Resp 16   SpO2 100%   Visual Acuity Right Eye Distance:   Left Eye Distance:   Bilateral Distance:    Right Eye Near:   Left Eye Near:    Bilateral Near:     Physical Exam  Constitutional: She is oriented to person, place, and time. She appears  well-developed and well-nourished. No distress.  Eyes: EOM are normal.  Neck: Normal range of motion. Neck supple.  Cardiovascular: Normal rate, regular rhythm, normal heart sounds and intact distal pulses.   Pulmonary/Chest: Effort normal. No respiratory distress.  Abdominal: There is no rebound.  Abdomen firm. Bowel sounds present in the epigastrium. Other areas are hypoactive or absent of bowel sounds. Entire abdomen percusses dull to flat. Tenderness in the left lower quadrant.guarding in the left lower quadrant.  Musculoskeletal: Normal range of motion.  Neurological: She is alert and oriented to person, place, and time.  Skin: Skin is warm and dry.  Nursing note and vitals reviewed.    UC Treatments / Results  Labs (all labs ordered are listed, but only abnormal results are displayed) Labs Reviewed - No data to display  EKG  EKG Interpretation None       Radiology Dg Abd 2 Views  Result Date: 06/19/2017 CLINICAL DATA:  Abdominal pain. EXAM: ABDOMEN - 2 VIEW COMPARISON:  CT 06/04/2017 . FINDINGS: Soft tissue structures are unremarkable. No bowel distention or free air. Aortoiliac atherosclerotic vascular calcification. Visceral atherosclerotic vascular calcification. Bilateral iliac stents. Prior median sternotomy. IMPRESSION: 1. No acute intra-abdominal abnormality identified. 2. Aortoiliac and visceral atherosclerotic vascular disease. Bilateral iliac stents. Electronically Signed   By: Marcello Moores  Register   On: 06/19/2017 14:51    Procedures Procedures (including critical care time)  Medications Ordered in UC Medications - No data to display   Initial Impression / Assessment and Plan / UC Course  I have  reviewed the triage vital signs and the nursing notes.  Pertinent labs & imaging results that were available during my care of the patient were reviewed by me and considered in my medical decision making (see chart for details).    It is not clear what is causing the diarrhea. Recommend not taking Pepto-Bismol but taking a fiber supplement. This may increase the bulk of the stool. If you are having abdominal distention or pain or not having normal bowel movements you may need to see your primary care provider immediately or go to emergency department depending on the amount of pain. Call your time care provider for an appointment either this afternoon or tomorrow for follow-up.    Final Clinical Impressions(s) / UC Diagnoses   Final diagnoses:  Diarrhea, unspecified type    New Prescriptions Current Discharge Medication List    There was an hour and 20 minute computer/radiography snafu delaying the pt's stay.   Controlled Substance Prescriptions [place] Controlled Substance Registry consulted? Not Applicable   Janne Napoleon, NP 06/19/17 [place], David, NP 06/19/17 1529

## 2017-06-21 ENCOUNTER — Ambulatory Visit (INDEPENDENT_AMBULATORY_CARE_PROVIDER_SITE_OTHER): Payer: Medicare Other | Admitting: Internal Medicine

## 2017-06-21 ENCOUNTER — Encounter: Payer: Self-pay | Admitting: Internal Medicine

## 2017-06-21 VITALS — BP 136/70 | HR 73 | Temp 98.3°F | Ht 65.0 in | Wt 164.0 lb

## 2017-06-21 DIAGNOSIS — K862 Cyst of pancreas: Secondary | ICD-10-CM | POA: Diagnosis not present

## 2017-06-21 DIAGNOSIS — K869 Disease of pancreas, unspecified: Secondary | ICD-10-CM | POA: Diagnosis not present

## 2017-06-21 DIAGNOSIS — K5903 Drug induced constipation: Secondary | ICD-10-CM

## 2017-06-21 DIAGNOSIS — F419 Anxiety disorder, unspecified: Secondary | ICD-10-CM

## 2017-06-21 DIAGNOSIS — I10 Essential (primary) hypertension: Secondary | ICD-10-CM | POA: Diagnosis not present

## 2017-06-21 DIAGNOSIS — Z23 Encounter for immunization: Secondary | ICD-10-CM

## 2017-06-21 MED ORDER — MAGNESIUM CITRATE PO SOLN: 1.0000 | Freq: Once | ORAL | 0 refills | Status: AC

## 2017-06-21 NOTE — Patient Instructions (Signed)
We have sent in magnesium citrate for the constipation to drink to clear the system.   We have ordered the MRI test to check the pancreas. Depending on the results we will take the next step if needed which would likely be a GI doctor.

## 2017-06-21 NOTE — Progress Notes (Signed)
   Subjective:    Patient ID: Brenda [name], female    DOB: 04/19/1956, 61 y.o.   MRN: [id number]  HPI The patient is a 61 YO female coming in for stomach problems. Started about 1-2 weeks ago with diarrhea. She started taking pepto bismol and went to the urgent care 2 days ago. They advised her to use fiber or pepto bismol for the diarrhea. Since that time she has not gone at all. Denies blood in stool. Denies passing gas. She went a little pellet this morning. She denies fevers or chills. She is also concerned about lesion on her pancreas which was found inadvertently on a CT scan of the vessels looking for PVD severity. She was then told about it and referred to general surgery (apt 07/09/17). She is concerned and wants to know if something could be done sooner and is also a little confused as to what this is or what happened. Denies every having pancreas problem or pancreatitis to her knowledge even years ago.   Review of Systems  Constitutional: Positive for activity change and appetite change. Negative for fatigue, fever and unexpected weight change.  HENT: Negative.   Eyes: Negative.   Respiratory: Negative for cough, chest tightness and shortness of breath.   Cardiovascular: Negative for chest pain, palpitations and leg swelling.  Gastrointestinal: Negative for abdominal distention, abdominal pain, anal bleeding, blood in stool, constipation, diarrhea, nausea, rectal pain and vomiting.  Musculoskeletal: Positive for gait problem.  Skin: Negative.   Neurological: Negative for dizziness, weakness and light-headedness.  Psychiatric/Behavioral: Negative.       Objective:   Physical Exam  Constitutional: She is oriented to person, place, and time. She appears well-developed and well-nourished.  HENT:  Head: Normocephalic and atraumatic.  Eyes: EOM are normal.  Neck: Normal range of motion.  Cardiovascular: Normal rate and regular rhythm.   Pulmonary/Chest: Effort normal and breath sounds  normal. No respiratory distress. She has no wheezes. She has no rales.  Abdominal: Soft. Bowel sounds are normal. She exhibits no distension and no mass. There is no tenderness. There is no rebound and no guarding.  Musculoskeletal: She exhibits no edema.  Neurological: She is alert and oriented to person, place, and time. Coordination normal.  Skin: Skin is warm and dry.  Psychiatric: She has a normal mood and affect.   Vitals:   06/21/17 1430  BP: 136/70  Pulse: 73  Temp: 98.3 F (36.8 C)  TempSrc: Oral  SpO2: 99%  Weight: 164 lb (74.4 kg)  Height: 5\' 5"  (1.651 m)      Assessment & Plan:  Flu shot given at visit

## 2017-06-22 ENCOUNTER — Encounter: Payer: Self-pay | Admitting: Internal Medicine

## 2017-06-22 DIAGNOSIS — K869 Disease of pancreas, unspecified: Secondary | ICD-10-CM | POA: Insufficient documentation

## 2017-06-22 DIAGNOSIS — K5900 Constipation, unspecified: Secondary | ICD-10-CM | POA: Insufficient documentation

## 2017-06-22 NOTE — Assessment & Plan Note (Signed)
BP at goal on her spironolactone, metoprolol, lisinopril/hctz, amlodipine. Last BMP reviewed and at goal.

## 2017-06-22 NOTE — Assessment & Plan Note (Signed)
Sounds to be drug induced from taking too much medications for diarrhea. It is unclear what caused the diarrhea. Likely pancreas lesions have been present for some time (at leas the cysts). Will rx magnesium citrate and handle recurrence if needed.

## 2017-06-22 NOTE — Assessment & Plan Note (Signed)
CT of the abdomen done for PVD by vascular surgery and showed many cysts in the pancreas and 1 lesion which was not characterizable. Recommended MRI with pancreas protocol. This is ordered today. Advised her that I do not think she will benefit by seeing general surgery and that if further action is required she would need a GI specialist.

## 2017-06-22 NOTE — Assessment & Plan Note (Signed)
She is having a flare of her anxiety and mood problems with this recent pancreas finding. Counseling and support given at the visit as well as information about her findings. She denies the need for medication today. Still taking effexor which is helpful some. Talked about coping strategies.

## 2017-06-26 ENCOUNTER — Telehealth: Payer: Self-pay | Admitting: Oncology

## 2017-06-26 NOTE — Telephone Encounter (Signed)
Spoke with patient - she missed her annual appointment in May and needed to get that rescheduled

## 2017-06-28 ENCOUNTER — Ambulatory Visit (HOSPITAL_BASED_OUTPATIENT_CLINIC_OR_DEPARTMENT_OTHER): Payer: Medicare Other | Admitting: Oncology

## 2017-06-28 ENCOUNTER — Ambulatory Visit
Admission: RE | Admit: 2017-06-28 | Discharge: 2017-06-28 | Disposition: A | Discharge status: Home / Self Care | Payer: Medicare Other | Source: Ambulatory Visit | Attending: Internal Medicine | Admitting: Internal Medicine

## 2017-06-28 VITALS — BP 150/77 | HR 70 | Temp 98.3°F | Resp 17 | Ht 65.0 in | Wt 164.2 lb

## 2017-06-28 DIAGNOSIS — K862 Cyst of pancreas: Secondary | ICD-10-CM

## 2017-06-28 DIAGNOSIS — Z853 Personal history of malignant neoplasm of breast: Secondary | ICD-10-CM | POA: Diagnosis not present

## 2017-06-28 DIAGNOSIS — C50912 Malignant neoplasm of unspecified site of left female breast: Secondary | ICD-10-CM

## 2017-06-28 MED ORDER — GADOBENATE DIMEGLUMINE 529 MG/ML IV SOLN
15.0000 mL | Freq: Once | INTRAVENOUS | Status: AC | PRN
  Administered 2017-06-28: 15 mL via INTRAVENOUS

## 2017-06-28 NOTE — Progress Notes (Signed)
  [place] OFFICE PROGRESS NOTE   Diagnosis: Breast cancer  INTERVAL HISTORY:   Ms. Karow returns as scheduled. No change over either breast. She had a mammogram in the spring (we do not have this report). She is undergone procedures for treatment of peripheral vascular disease including stenting of the bilateral common iliac arteries 06/13/2017. A CT angiogram 06/05/2017 found evidence of chronic pancreatitis and an ill-defined low-attenuation lesion in the pancreas at the junction of the body and tail. She is scheduled for an MRI of the abdomen today. She has intermittent diarrhea and constipation. She has "burning "after certain foods. Objective:  Vital signs in last 24 hours:  Blood pressure (!) 150/77, pulse 70, temperature 98.3 F (36.8 C), temperature source Oral, resp. rate 17, height 5\' 5"  (1.651 m), weight 164 lb 3.2 oz (74.5 kg), SpO2 100 %.    HEENT:  Neck without mass Lymphatics:  No cervical, supraclavicular, or axillary nodes Resp:  Lungs clear bilaterally Cardio:  Regular rate and rhythm, 2/6 systolic murmur GI:  No hepatomegaly Vascular:  No leg edema breast: Bilateral breast without mass, status post left lumpectomy. No evidence for local tumor recurrence  Medications: I have reviewed the patient's current medications.  Assessment/Plan: 1. Stage I left-sided breast cancer diagnosed December 1999.    Disposition:  Ms. Offenberger remains in clinical remission from breast cancer. She would like to continue follow-up at the [facility]. She will return for an office visit in one year. She is concerned about the potential cardiac toxicity from radiation. We will ask radiation oncology to contact her regarding the radiation technique in 1999.  15 minutes were spent with the patient today. The majority of the time was used for counseling and coordination of care.  Donneta Romberg, MD  06/28/2017  12:24 PM

## 2017-06-29 ENCOUNTER — Telehealth: Payer: Self-pay | Admitting: Internal Medicine

## 2017-06-29 NOTE — Telephone Encounter (Signed)
Patient called back in regard to MRI.  Patient states since she has a history of cancer would she still be able to get this biopsied?

## 2017-06-29 NOTE — Telephone Encounter (Signed)
No, this is not cancer and a biopsy would be an invasive procedure that would have more risk than benefit.

## 2017-07-02 NOTE — Telephone Encounter (Signed)
Patient notified

## 2017-07-04 ENCOUNTER — Other Ambulatory Visit: Payer: Self-pay | Admitting: *Deleted

## 2017-07-04 DIAGNOSIS — I6529 Occlusion and stenosis of unspecified carotid artery: Secondary | ICD-10-CM

## 2017-07-26 ENCOUNTER — Ambulatory Visit (INDEPENDENT_AMBULATORY_CARE_PROVIDER_SITE_OTHER)
Admit: 2017-07-26 | Discharge: 2017-07-26 | Disposition: A | Discharge status: Home / Self Care | Payer: Medicare Other | Attending: Vascular Surgery | Admitting: Vascular Surgery

## 2017-07-26 ENCOUNTER — Ambulatory Visit (HOSPITAL_COMMUNITY)
Admit: 2017-07-26 | Discharge: 2017-07-26 | Disposition: A | Discharge status: Home / Self Care | Payer: Medicare Other | Source: Ambulatory Visit | Attending: Vascular Surgery | Admitting: Vascular Surgery

## 2017-07-26 DIAGNOSIS — I745 Embolism and thrombosis of iliac artery: Secondary | ICD-10-CM

## 2017-07-26 DIAGNOSIS — Z48812 Encounter for surgical aftercare following surgery on the circulatory system: Secondary | ICD-10-CM

## 2017-07-26 DIAGNOSIS — E785 Hyperlipidemia, unspecified: Secondary | ICD-10-CM | POA: Insufficient documentation

## 2017-07-26 DIAGNOSIS — I10 Essential (primary) hypertension: Secondary | ICD-10-CM | POA: Insufficient documentation

## 2017-07-26 DIAGNOSIS — Z87898 Personal history of other specified conditions: Secondary | ICD-10-CM | POA: Insufficient documentation

## 2017-07-27 ENCOUNTER — Ambulatory Visit (INDEPENDENT_AMBULATORY_CARE_PROVIDER_SITE_OTHER): Payer: Medicare Other | Admitting: Vascular Surgery

## 2017-07-27 ENCOUNTER — Encounter: Payer: Self-pay | Admitting: Vascular Surgery

## 2017-07-27 VITALS — BP 149/70 | HR 61 | Resp 18 | Ht 65.0 in | Wt 163.9 lb

## 2017-07-27 DIAGNOSIS — I998 Other disorder of circulatory system: Secondary | ICD-10-CM | POA: Diagnosis not present

## 2017-07-27 DIAGNOSIS — I70229 Atherosclerosis of native arteries of extremities with rest pain, unspecified extremity: Secondary | ICD-10-CM

## 2017-07-27 NOTE — Progress Notes (Signed)
Patient ID: Brenda [name], female   DOB: 11/24/1955, 61 y.o.   MRN: [id number]  Reason for Consult: Follow-up (4-6 wk f/u )   Referred by [name], *  Subjective:     HPI:  Brenda [name] is a 61 y.o. female status post bilateral common iliac artery stenting.  This was done for right great toe ulceration as well as left lower extremity significant rest pain and CT scan demonstrating tight stenoses of her aortoiliac segment with an occluded left common iliac artery.  Since that time she has healed the ulcer on her right great toe.  She states that her rest pain is gone she is now working out.  She is very thankful for having had her procedure.  She remains on Plavix aspirin and Crestor.  Past Medical History:  Diagnosis Date  . Cancer [facility])    breast cancer  . Carotid artery stenosis   . CHF (congestive heart failure) ([place])   . Coronary artery disease   . Depression   . Hypercholesteremia   . Hypertension   . Hyperthyroidism   . Stroke [facility])    Family History  Problem Relation Age of Onset  . Lung cancer Mother        died at an early age  . Cancer Mother        lung  . Other Unknown        There is no Hx of premature coronary artery disease in her family   Past Surgical History:  Procedure Laterality Date  . ABDOMINAL AORTOGRAM W/LOWER EXTREMITY N/A 06/13/2017   Performed by Waynetta Sandy, MD at [place] [facility]  . ABDOMINAL HYSTERECTOMY    . BREAST LUMPECTOMY  1999  . BREAST LUMPECTOMY     left breast  . CARDIAC SURGERY    . CAROTID ENDARTERECTOMY  04-2000   left  . CORONARY ARTERY BYPASS GRAFT  2001   x 5 (left internal mammary artery to left anterior  descending coronary artery  . PERIPHERAL VASCULAR INTERVENTION  06/13/2017   Performed by Waynetta Sandy, MD at [facility] INVASIVE [facility]    Short Social History:  Social History   Tobacco Use  . Smoking status: Former Smoker    Types: Cigarettes    Last attempt to quit: 09/11/2001   Years since quitting: 15.8  . Smokeless tobacco: Never Used  Substance Use Topics  . Alcohol use: No    Allergies  Allergen Reactions  . Latex     unknown    Current Outpatient Medications  Medication Sig Dispense Refill  . amLODipine (NORVASC) 10 MG tablet Take 10 mg by mouth daily.    Marland Kitchen aspirin 81 MG tablet Take 81 mg by mouth daily.      . Cholecalciferol (VITAMIN D) 1000 UNITS capsule Take 2,000 Units by mouth daily.     . clopidogrel (PLAVIX) 75 MG tablet Take 1 tablet (75 mg total) by mouth daily. 30 tablet 3  . lisinopril-hydrochlorothiazide (PRINZIDE,ZESTORETIC) 20-25 MG tablet Take 2 tablets by mouth daily. 180 tablet 1  . metoprolol succinate (TOPROL-XL) 100 MG 24 hr tablet TAKE 1 TABLET BY MOUTH ONCE DAILY WITH  OR  IMMEDIATELY  FOLLOWING  A  MEAL 90 tablet 3  . Multiple Vitamin (MULTIVITAMIN) tablet Take 1 tablet by mouth daily.      . nitroGLYCERIN (NITROSTAT) 0.4 MG SL tablet Place 1 tablet (0.4 mg total) under the tongue every 5 (five) minutes as needed for chest pain (  x 3 tabs daily). 20 tablet 1  . rosuvastatin (CRESTOR) 40 MG tablet Take 1 tablet (40 mg total) by mouth daily. 90 tablet 3  . spironolactone (ALDACTONE) 25 MG tablet Take 2 tablets (50 mg total) by mouth daily. 60 tablet 3  . venlafaxine XR (EFFEXOR XR) 37.5 MG 24 hr capsule Take 1 capsule (37.5 mg total) by mouth daily with breakfast. 30 capsule 1   No current facility-administered medications for this visit.     Review of Systems  Constitutional:  Constitutional negative. HENT: HENT negative.  Eyes: Eyes negative.  Respiratory: Respiratory negative.  Cardiovascular: Positive for claudication.  GI: Gastrointestinal negative.  Musculoskeletal: Musculoskeletal negative.  Skin: Skin negative.  Neurological: Neurological negative. Hematologic: Hematologic/lymphatic negative.  Psychiatric: Psychiatric negative.        Objective:  Objective   Vitals:   07/27/17 1143 07/27/17 1144  BP: (!)  152/72 (!) 149/70  Pulse: 61   Resp: 18   SpO2: 100%   Weight: 163 lb 14.4 oz (74.3 kg)   Height: 5\' 5"  (1.651 m)    Body mass index is 27.27 kg/m.  Physical Exam  Constitutional: She is oriented to person, place, and time.  HENT:  Head: Normocephalic.  Eyes: Pupils are equal, round, and reactive to light.  Neck: Normal range of motion.  Cardiovascular:  Pulses:      Popliteal pulses are 2+ on the right side, and 2+ on the left side.  Pulmonary/Chest: Effort normal.  Abdominal: Soft.  Musculoskeletal: Normal range of motion. She exhibits no edema.  Neurological: She is alert and oriented to person, place, and time.  Skin: Skin is warm and dry.  Psychiatric: She has a normal mood and affect. Her behavior is normal. Thought content normal.    Data: I have reviewed her ABIs and iliac artery stent duplex which demonstrates an elevated velocity in the distal left stent but no other stenosis and her ABIs are greater than 1 bilaterally with triphasic waveforms post on the posterior tibial arteries.     Assessment/Plan:     61 year old female follows up from bilateral iliac artery stents.  She is now healed her right great toe ulceration and has no further rest pain on the left.  She does have some elevated velocities in the left sided stent where she was previously occluded.  Her ABIs are greater than 1 bilaterally with triphasic waveforms.  She will continue Plavix and aspirin we will see her in 3-4 months with repeat studies at which time we can put her out to 6 months to a year if they are unchanged.  She demonstrates good understanding we will see her then     Waynetta Sandy MD [facility]

## 2017-07-30 NOTE — Addendum Note (Signed)
Addended by: Lianne Cure A on: 07/30/2017 04:15 PM   Modules accepted: Orders

## 2017-08-06 ENCOUNTER — Ambulatory Visit (HOSPITAL_COMMUNITY)
Admission: RE | Admit: 2017-08-06 | Discharge: 2017-08-06 | Disposition: A | Discharge status: Home / Self Care | Payer: Medicare Other | Source: Ambulatory Visit | Attending: Cardiology | Admitting: Cardiology

## 2017-08-06 DIAGNOSIS — I6529 Occlusion and stenosis of unspecified carotid artery: Secondary | ICD-10-CM | POA: Diagnosis present

## 2017-09-26 ENCOUNTER — Encounter (HOSPITAL_COMMUNITY): Payer: Self-pay | Admitting: *Deleted

## 2017-10-09 ENCOUNTER — Other Ambulatory Visit: Payer: Self-pay | Admitting: Cardiology

## 2017-11-30 ENCOUNTER — Ambulatory Visit (HOSPITAL_COMMUNITY)
Admission: RE | Admit: 2017-11-30 | Discharge: 2017-11-30 | Disposition: A | Discharge status: Home / Self Care | Payer: Medicare Other | Source: Ambulatory Visit | Attending: Family | Admitting: Family

## 2017-11-30 ENCOUNTER — Ambulatory Visit (INDEPENDENT_AMBULATORY_CARE_PROVIDER_SITE_OTHER)
Admission: RE | Admit: 2017-11-30 | Discharge: 2017-11-30 | Disposition: A | Discharge status: Home / Self Care | Payer: Medicare Other | Source: Ambulatory Visit | Attending: Vascular Surgery | Admitting: Vascular Surgery

## 2017-11-30 ENCOUNTER — Encounter: Payer: Self-pay | Admitting: Vascular Surgery

## 2017-11-30 ENCOUNTER — Ambulatory Visit (INDEPENDENT_AMBULATORY_CARE_PROVIDER_SITE_OTHER): Payer: Medicare Other | Admitting: Vascular Surgery

## 2017-11-30 ENCOUNTER — Other Ambulatory Visit: Payer: Self-pay

## 2017-11-30 VITALS — BP 180/96 | HR 63 | Temp 98.0°F | Resp 18 | Ht 65.0 in | Wt 164.7 lb

## 2017-11-30 DIAGNOSIS — Z95820 Peripheral vascular angioplasty status with implants and grafts: Secondary | ICD-10-CM | POA: Insufficient documentation

## 2017-11-30 DIAGNOSIS — I771 Stricture of artery: Secondary | ICD-10-CM | POA: Insufficient documentation

## 2017-11-30 DIAGNOSIS — I745 Embolism and thrombosis of iliac artery: Secondary | ICD-10-CM | POA: Diagnosis not present

## 2017-11-30 DIAGNOSIS — I998 Other disorder of circulatory system: Secondary | ICD-10-CM | POA: Diagnosis not present

## 2017-11-30 DIAGNOSIS — I70229 Atherosclerosis of native arteries of extremities with rest pain, unspecified extremity: Secondary | ICD-10-CM

## 2017-11-30 NOTE — Progress Notes (Signed)
Patient ID: Brenda [name], female   DOB: 11-Jan-1956, 62 y.o.   MRN: [id number]  Reason for Consult: Follow-up (3-4 month f/u )   Referred by [name], *  Subjective:     HPI:  Brenda [name] is a 62 y.o. female follows up from bilateral common iliac artery stenting last October for right great toe ulceration with nearly bilateral occluded iliac arteries.  Since that time she has been very active is doing very well limiting her risk factors.  She continues on Plavix at this time.  She is very active in yoga.  She does have some tingling in her buttocks and down her legs with activity.  She has no claudication no rest pain and no tissue loss at this time.  Past Medical History:  Diagnosis Date  . Cancer [facility])    breast cancer  . Carotid artery stenosis   . CHF (congestive heart failure) ([place])   . Coronary artery disease   . Depression   . Hypercholesteremia   . Hypertension   . Hyperthyroidism   . Stroke [facility])    Family History  Problem Relation Age of Onset  . Lung cancer Mother        died at an early age  . Cancer Mother        lung  . Other Unknown        There is no Hx of premature coronary artery disease in her family   Past Surgical History:  Procedure Laterality Date  . ABDOMINAL AORTOGRAM W/LOWER EXTREMITY N/A 06/13/2017   Procedure: ABDOMINAL AORTOGRAM W/LOWER EXTREMITY;  Surgeon: Waynetta Sandy, MD;  Location: [place];  Service: Cardiovascular;  Laterality: N/A;  Bilateral  . ABDOMINAL HYSTERECTOMY    . BREAST LUMPECTOMY  1999  . BREAST LUMPECTOMY     left breast  . CARDIAC SURGERY    . CAROTID ENDARTERECTOMY  04-2000   left  . CORONARY ARTERY BYPASS GRAFT  2001   x 5 (left internal mammary artery to left anterior  descending coronary artery  . PERIPHERAL VASCULAR INTERVENTION  06/13/2017   Procedure: PERIPHERAL VASCULAR INTERVENTION;  Surgeon: Waynetta Sandy, MD;  Location: [place] [facility];  Service:  Cardiovascular;;  Bilateral Iliac Stents    Short Social History:  Social History   Tobacco Use  . Smoking status: Former Smoker    Types: Cigarettes    Last attempt to quit: 09/11/2001    Years since quitting: 16.2  . Smokeless tobacco: Never Used  Substance Use Topics  . Alcohol use: No    Allergies  Allergen Reactions  . Latex     unknown    Current Outpatient Medications  Medication Sig Dispense Refill  . amLODipine (NORVASC) 10 MG tablet Take 10 mg by mouth daily.    Marland Kitchen aspirin 81 MG tablet Take 81 mg by mouth daily.      . Cholecalciferol (VITAMIN D) 1000 UNITS capsule Take 2,000 Units by mouth daily.     . clopidogrel (PLAVIX) 75 MG tablet Take 1 tablet (75 mg total) by mouth daily. 30 tablet 3  . lisinopril-hydrochlorothiazide (PRINZIDE,ZESTORETIC) 20-25 MG tablet Take 2 tablets by mouth daily. 180 tablet 1  . metoprolol succinate (TOPROL-XL) 100 MG 24 hr tablet TAKE 1 TABLET BY MOUTH ONCE DAILY WITH  OR  IMMEDIATELY  FOLLOWING  A  MEAL 90 tablet 3  . Multiple Vitamin (MULTIVITAMIN) tablet Take 1 tablet by mouth daily.      . nitroGLYCERIN (  NITROSTAT) 0.4 MG SL tablet Place 1 tablet (0.4 mg total) under the tongue every 5 (five) minutes as needed for chest pain (x 3 tabs daily). 20 tablet 1  . rosuvastatin (CRESTOR) 40 MG tablet Take 1 tablet (40 mg total) by mouth daily. 90 tablet 3  . spironolactone (ALDACTONE) 25 MG tablet TAKE 2 TABLETS BY MOUTH ONCE DAILY (Patient not taking: Reported on 11/30/2017) 60 tablet 3  . venlafaxine XR (EFFEXOR XR) 37.5 MG 24 hr capsule Take 1 capsule (37.5 mg total) by mouth daily with breakfast. (Patient not taking: Reported on 11/30/2017) 30 capsule 1   No current facility-administered medications for this visit.     Review of Systems  Constitutional:  Constitutional negative. HENT: HENT negative.  Eyes: Eyes negative.  Cardiovascular: Cardiovascular negative.  GI: Gastrointestinal negative.  Musculoskeletal: Positive for leg pain and  joint pain.  Skin: Skin negative.  Hematologic: Hematologic/lymphatic negative.  Psychiatric: Psychiatric negative.        Objective:  Objective   Vitals:   11/30/17 0926 11/30/17 0931  BP: (!) 181/97 (!) 180/96  Pulse: 63   Resp: 18   Temp: 98 F (36.7 C)   TempSrc: Oral   SpO2: 99%   Weight: 164 lb 11.2 oz (74.7 kg)   Height: 5\' 5"  (1.651 m)    Body mass index is 27.41 kg/m.  Physical Exam  Constitutional: She is oriented to person, place, and time. She appears well-developed.  HENT:  Head: Normocephalic.  Eyes: Pupils are equal, round, and reactive to light.  Neck: Normal range of motion.  Cardiovascular: Normal rate.  Pulses:      Femoral pulses are 2+ on the right side, and 2+ on the left side.      Popliteal pulses are 2+ on the right side, and 2+ on the left side.       Posterior tibial pulses are 2+ on the right side, and 2+ on the left side.  Pulmonary/Chest: Effort normal.  Abdominal: Soft. She exhibits no mass.  Musculoskeletal: Normal range of motion. She exhibits no edema.  Neurological: She is alert and oriented to person, place, and time.  Skin: Skin is warm and dry.  Psychiatric: She has a normal mood and affect. Her behavior is normal. Judgment and thought content normal.    Data: I have independently interpreted her ABIs to be 1.1 in the right and 1.09 left which are stable from previous.  Waveforms throughout the stent are biphasic with 304 cm/s distal to the right stent.     Assessment/Plan:     Is a 62-year-old female with history of bilateral common iliac artery stenting for what was nearly occluded right common iliac artery and an occluded left common iliac artery.  At the time she had toe ulceration which is now healed.  She remains very active through yoga.  There is no indication for intervention at this time although there is some evidence of distal stent stenosis on the right side that was there previously   The velocity has elevated  somewhat from November though.  We will follow her up in another 6 months with repeat noninvasive studies.  I am encouraged by her level activity and her dedication to overall wellness and I have congratulated her on this.     Waynetta Sandy MD [facility]

## 2017-12-03 ENCOUNTER — Other Ambulatory Visit: Payer: Self-pay

## 2017-12-03 DIAGNOSIS — I745 Embolism and thrombosis of iliac artery: Secondary | ICD-10-CM

## 2017-12-03 DIAGNOSIS — I739 Peripheral vascular disease, unspecified: Secondary | ICD-10-CM

## 2017-12-03 DIAGNOSIS — I70229 Atherosclerosis of native arteries of extremities with rest pain, unspecified extremity: Secondary | ICD-10-CM

## 2017-12-03 DIAGNOSIS — I998 Other disorder of circulatory system: Secondary | ICD-10-CM

## 2017-12-19 ENCOUNTER — Ambulatory Visit (INDEPENDENT_AMBULATORY_CARE_PROVIDER_SITE_OTHER): Payer: Medicare Other | Admitting: Family Medicine

## 2017-12-19 ENCOUNTER — Encounter: Payer: Self-pay | Admitting: Family Medicine

## 2017-12-19 ENCOUNTER — Ambulatory Visit (INDEPENDENT_AMBULATORY_CARE_PROVIDER_SITE_OTHER)
Admission: RE | Admit: 2017-12-19 | Discharge: 2017-12-19 | Disposition: A | Discharge status: Home / Self Care | Payer: Medicare Other | Source: Ambulatory Visit | Attending: Family Medicine | Admitting: Family Medicine

## 2017-12-19 VITALS — BP 158/80 | HR 66 | Ht 65.0 in | Wt 167.0 lb

## 2017-12-19 DIAGNOSIS — M25559 Pain in unspecified hip: Secondary | ICD-10-CM

## 2017-12-19 DIAGNOSIS — M5440 Lumbago with sciatica, unspecified side: Secondary | ICD-10-CM | POA: Diagnosis not present

## 2017-12-19 NOTE — Assessment & Plan Note (Signed)
Likely musculoskeletal.  Patient though has had a history of cancer previously and will get back x-rays.  No weight loss recently.  Patient is trying to increase her activity since her surgery for the peripheral vascular disease.  We discussed more low impact exercises could be beneficial.  We discussed proper lifting mechanics.  Home exercises given and patient work with Product/process development scientist.  Patient encouraged to potentially try gabapentin short-term and see how this helps.  Encourage her to continue the vitamin D supplementation.  Patient will follow up with me again in 3-4 weeks and will discuss further.

## 2017-12-19 NOTE — Patient Instructions (Signed)
Good to see you  I am so happy you are doing so great!!!! Warms my heart I am fine with you increasing activity but keep your body guessing.  Ice 20 minutes 2 times daily. Usually after activity and before bed. Exercises 3 times a week.  Gabapentin 100mg  at night would be great  We need to stretch the hip flexors Lets get xrays today as well just to check  See me again in 4-6 weeks

## 2017-12-19 NOTE — Progress Notes (Signed)
[facility] [address], [place] [address] Phone: [phone] Subjective:    I'm seeing this patient by the request  of:    CC: Back and leg pain  ONG:[id number]  Brenda [name] is a 62 y.o. female coming in with complaint of back and leg pain.  Patient was last seen 7 months ago.  At that time there was concern for peripheral vascular disease.  ABIs were significantly unremarkable.  Was sent to vascular.  Patient did have ultrasound-guided cannulization of the bilateral common femoral arteries and had stenting of the common iliac arteries.  Patient has been feeling much better in the legs.  Patient wants to become much more active.  Patient attempted to do yoga but was doing it every day for 2 weeks and started having increasing pelvic pain and back pain.  Patient wants to start to work out on a regular basis but is finding it difficult secondary to this.  Patient is looking for some guidance.  Feels that if any time she does too much activity and thinks that she has significant tightness.  No radiation of the legs or any numbness or tingling.  Patient states that the pain can be as severe as 5 out of 10.  Avoiding anti-inflammatories.     Past Medical History:  Diagnosis Date  . Cancer [facility])    breast cancer  . Carotid artery stenosis   . CHF (congestive heart failure) ([place])   . Coronary artery disease   . Depression   . Hypercholesteremia   . Hypertension   . Hyperthyroidism   . Stroke [facility])    Past Surgical History:  Procedure Laterality Date  . ABDOMINAL AORTOGRAM W/LOWER EXTREMITY N/A 06/13/2017   Procedure: ABDOMINAL AORTOGRAM W/LOWER EXTREMITY;  Surgeon: Waynetta Sandy, MD;  Location: [place] [facility];  Service: Cardiovascular;  Laterality: N/A;  Bilateral  . ABDOMINAL HYSTERECTOMY    . BREAST LUMPECTOMY  1999  . BREAST LUMPECTOMY     left breast  . CARDIAC SURGERY    . CAROTID ENDARTERECTOMY  04-2000   left  . CORONARY  ARTERY BYPASS GRAFT  2001   x 5 (left internal mammary artery to left anterior  descending coronary artery  . PERIPHERAL VASCULAR INTERVENTION  06/13/2017   Procedure: PERIPHERAL VASCULAR INTERVENTION;  Surgeon: Waynetta Sandy, MD;  Location: [place] [facility];  Service: Cardiovascular;;  Bilateral Iliac Stents   Social History   Socioeconomic History  . Marital status: Single    Spouse name: Not on file  . Number of children: 1  . Years of education: Not on file  . Highest education level: Not on file  Occupational History  . Occupation: Training and development officer: UNEMPLOYED  Social Needs  . Financial resource strain: Not on file  . Food insecurity:    Worry: Not on file    Inability: Not on file  . Transportation needs:    Medical: Not on file    Non-medical: Not on file  Tobacco Use  . Smoking status: Former Smoker    Types: Cigarettes    Last attempt to quit: 09/11/2001    Years since quitting: 16.2  . Smokeless tobacco: Never Used  Substance and Sexual Activity  . Alcohol use: No  . Drug use: No  . Sexual activity: Not on file  Lifestyle  . Physical activity:    Days per week: Not on file    Minutes per session: Not  on file  . Stress: Not on file  Relationships  . Social connections:    Talks on phone: Not on file    Gets together: Not on file    Attends religious service: Not on file    Active member of club or organization: Not on file    Attends meetings of clubs or organizations: Not on file    Relationship status: Not on file  Other Topics Concern  . Not on file  Social History Narrative  . Not on file   Allergies  Allergen Reactions  . Latex     unknown   Family History  Problem Relation Age of Onset  . Lung cancer Mother        died at an early age  . Cancer Mother        lung  . Other Unknown        There is no Hx of premature coronary artery disease in her family     Past medical history, social, surgical and family history  all reviewed in electronic medical record.  No pertanent information unless stated regarding to the chief complaint.   Review of Systems:Review of systems updated and as accurate as of 12/19/17  No headache, visual changes, nausea, vomiting, diarrhea, constipation, dizziness, abdominal pain, skin rash, fevers, chills, night sweats, weight loss, swollen lymph nodes, body aches, joint swelling, muscle aches, chest pain, shortness of breath, mood changes.   Objective  Blood pressure (!) 158/80, pulse 66, height 5\' 5"  (1.651 m), weight 167 lb (75.8 kg), SpO2 98 %. Systems examined below as of 12/19/17   General: No apparent distress alert and oriented x3 mood and affect normal, dressed appropriately.  HEENT: Pupils equal, extraocular movements intact  Respiratory: Patient's speak in full sentences and does not appear short of breath  Cardiovascular: No lower extremity edema, non tender, no erythema  Skin: Warm dry intact with no signs of infection or rash on extremities or on axial skeleton.  Abdomen: Soft nontender  Neuro: Cranial nerves II through XII are intact, neurovascularly intact in all extremities with 2+ DTRs and 2+ pulses.  Lymph: No lymphadenopathy of posterior or anterior cervical chain or axillae bilaterally.  Gait normal with good balance and coordination.  MSK:  Non tender with full range of motion and good stability and symmetric strength and tone of shoulders, elbows, wrist,  knee and ankles bilaterally.  Mild arthritic changes Back Exam: b Inspection: Mild loss of lordosis Motion: Flexion 45 deg, Extension 25 deg, Side Bending to 35 deg bilaterally,  Rotation to 30 deg bilaterally  SLR laying: Negative  XSLR laying: Negative  Palpable tenderness: Mild tenderness to palpation of the paraspinal musculature lumbar spine. FABER: Severe tightness bilaterally.. Sensory change: Gross sensation intact to all lumbar and sacral dermatomes.  Reflexes: 2+ at both patellar tendons, 2+  at achilles tendons, Babinski's downgoing.  Strength at foot  Plantar-flexion: 5/5 Dorsi-flexion: 5/5 Eversion: 5/5 Inversion: 5/5  Leg strength  Quad: 5/5 Hamstring: 5/5 Hip flexor: 5/5 Hip abductors: 5/5  Gait unremarkable.  Hip exam shows the patient does have some limited range of motion with internal rotation bilaterally.  [id number]; 15 additional minutes spent for Therapeutic exercises as stated in above notes.  This included exercises focusing on stretching, strengthening, with significant focus on eccentric aspects.   Long term goals include an improvement in range of motion, strength, endurance as well as avoiding reinjury. Patient's frequency would include in 1-2 times a day, 3-5 times a week  for a duration of 6-12 weeks. Low back exercises that included:  Pelvic tilt/bracing instruction to focus on control of the pelvic girdle and lower abdominal muscles  Glute strengthening exercises, focusing on proper firing of the glutes without engaging the low back muscles Proper stretching techniques for maximum relief for the hamstrings, hip flexors, low back and some rotation where tolerated   Proper technique shown and discussed handout in great detail with ATC.  All questions were discussed and answered.     Impression and Recommendations:     This case required medical decision making of moderate complexity.      Note: This dictation was prepared with Dragon dictation along with smaller phrase technology. Any transcriptional errors that result from this process are unintentional.

## 2018-01-04 ENCOUNTER — Other Ambulatory Visit: Payer: Self-pay | Admitting: Cardiology

## 2018-01-04 DIAGNOSIS — I2510 Atherosclerotic heart disease of native coronary artery without angina pectoris: Secondary | ICD-10-CM

## 2018-01-04 DIAGNOSIS — I10 Essential (primary) hypertension: Secondary | ICD-10-CM

## 2018-01-04 DIAGNOSIS — R0602 Shortness of breath: Secondary | ICD-10-CM

## 2018-01-04 DIAGNOSIS — E7800 Pure hypercholesterolemia, unspecified: Secondary | ICD-10-CM

## 2018-02-15 ENCOUNTER — Other Ambulatory Visit: Payer: Self-pay | Admitting: *Deleted

## 2018-02-15 DIAGNOSIS — I6529 Occlusion and stenosis of unspecified carotid artery: Secondary | ICD-10-CM

## 2018-02-18 ENCOUNTER — Other Ambulatory Visit: Payer: Self-pay | Admitting: Cardiology

## 2018-02-18 DIAGNOSIS — I2510 Atherosclerotic heart disease of native coronary artery without angina pectoris: Secondary | ICD-10-CM

## 2018-02-18 DIAGNOSIS — R0602 Shortness of breath: Secondary | ICD-10-CM

## 2018-02-18 DIAGNOSIS — I10 Essential (primary) hypertension: Secondary | ICD-10-CM

## 2018-02-18 DIAGNOSIS — E7800 Pure hypercholesterolemia, unspecified: Secondary | ICD-10-CM

## 2018-02-19 ENCOUNTER — Observation Stay (HOSPITAL_COMMUNITY)
Admission: EM | Admit: 2018-02-19 | Discharge: 2018-02-21 | Disposition: A | Discharge status: Home / Self Care | Payer: Medicare Other | Attending: Internal Medicine | Admitting: Internal Medicine

## 2018-02-19 ENCOUNTER — Encounter (HOSPITAL_COMMUNITY): Payer: Self-pay | Admitting: *Deleted

## 2018-02-19 ENCOUNTER — Emergency Department (HOSPITAL_COMMUNITY): Payer: Medicare Other

## 2018-02-19 ENCOUNTER — Other Ambulatory Visit: Payer: Self-pay

## 2018-02-19 DIAGNOSIS — I110 Hypertensive heart disease with heart failure: Secondary | ICD-10-CM | POA: Insufficient documentation

## 2018-02-19 DIAGNOSIS — Z951 Presence of aortocoronary bypass graft: Secondary | ICD-10-CM | POA: Diagnosis not present

## 2018-02-19 DIAGNOSIS — Z7982 Long term (current) use of aspirin: Secondary | ICD-10-CM | POA: Insufficient documentation

## 2018-02-19 DIAGNOSIS — I161 Hypertensive emergency: Secondary | ICD-10-CM | POA: Diagnosis not present

## 2018-02-19 DIAGNOSIS — Z91040 Latex allergy status: Secondary | ICD-10-CM | POA: Diagnosis not present

## 2018-02-19 DIAGNOSIS — F329 Major depressive disorder, single episode, unspecified: Secondary | ICD-10-CM | POA: Diagnosis not present

## 2018-02-19 DIAGNOSIS — R748 Abnormal levels of other serum enzymes: Secondary | ICD-10-CM | POA: Insufficient documentation

## 2018-02-19 DIAGNOSIS — R778 Other specified abnormalities of plasma proteins: Secondary | ICD-10-CM

## 2018-02-19 DIAGNOSIS — Z87891 Personal history of nicotine dependence: Secondary | ICD-10-CM | POA: Diagnosis not present

## 2018-02-19 DIAGNOSIS — E0590 Thyrotoxicosis, unspecified without thyrotoxic crisis or storm: Secondary | ICD-10-CM | POA: Insufficient documentation

## 2018-02-19 DIAGNOSIS — I2582 Chronic total occlusion of coronary artery: Secondary | ICD-10-CM | POA: Diagnosis not present

## 2018-02-19 DIAGNOSIS — I25110 Atherosclerotic heart disease of native coronary artery with unstable angina pectoris: Secondary | ICD-10-CM | POA: Insufficient documentation

## 2018-02-19 DIAGNOSIS — R7989 Other specified abnormal findings of blood chemistry: Secondary | ICD-10-CM

## 2018-02-19 DIAGNOSIS — I700 Atherosclerosis of aorta: Secondary | ICD-10-CM | POA: Diagnosis not present

## 2018-02-19 DIAGNOSIS — I509 Heart failure, unspecified: Secondary | ICD-10-CM | POA: Diagnosis not present

## 2018-02-19 DIAGNOSIS — I6529 Occlusion and stenosis of unspecified carotid artery: Secondary | ICD-10-CM | POA: Diagnosis not present

## 2018-02-19 DIAGNOSIS — E7800 Pure hypercholesterolemia, unspecified: Secondary | ICD-10-CM | POA: Insufficient documentation

## 2018-02-19 DIAGNOSIS — I739 Peripheral vascular disease, unspecified: Secondary | ICD-10-CM | POA: Insufficient documentation

## 2018-02-19 DIAGNOSIS — Z7902 Long term (current) use of antithrombotics/antiplatelets: Secondary | ICD-10-CM | POA: Insufficient documentation

## 2018-02-19 DIAGNOSIS — Z8673 Personal history of transient ischemic attack (TIA), and cerebral infarction without residual deficits: Secondary | ICD-10-CM | POA: Insufficient documentation

## 2018-02-19 DIAGNOSIS — R079 Chest pain, unspecified: Secondary | ICD-10-CM | POA: Diagnosis present

## 2018-02-19 LAB — CBC
HCT: 41.4 % (ref 36.0–46.0)
Hemoglobin: 13.9 g/dL (ref 12.0–15.0)
MCH: 31.2 pg (ref 26.0–34.0)
MCHC: 33.6 g/dL (ref 30.0–36.0)
MCV: 92.8 fL (ref 78.0–100.0)
Platelets: 203 10*3/uL (ref 150–400)
RBC: 4.46 MIL/uL (ref 3.87–5.11)
RDW: 12.8 % (ref 11.5–15.5)
WBC: 5.8 10*3/uL (ref 4.0–10.5)

## 2018-02-19 LAB — BASIC METABOLIC PANEL
Anion gap: 9 (ref 5–15)
BUN: 16 mg/dL (ref 6–20)
CO2: 28 mmol/L (ref 22–32)
Calcium: 9.5 mg/dL (ref 8.9–10.3)
Chloride: 102 mmol/L (ref 101–111)
Creatinine, Ser: 0.83 mg/dL (ref 0.44–1.00)
GFR calc Af Amer: >60 mL/min (ref >60)
GFR calc non Af Amer: >60 mL/min (ref >60)
Glucose, Bld: 92 mg/dL (ref 65–99)
Potassium: 3.3 mmol/L — ABNORMAL LOW (ref 3.5–5.1)
Sodium: 139 mmol/L (ref 135–145)

## 2018-02-19 LAB — I-STAT TROPONIN, ED
Troponin i, poc: 0.15 ng/mL (ref 0.00–0.08)
Troponin i, poc: 0.12 ng/mL (ref 0.00–0.08)

## 2018-02-19 MED ORDER — AMLODIPINE BESYLATE 5 MG PO TABS
10.0000 mg | ORAL_TABLET | Freq: Once | ORAL | Status: AC
  Administered 2018-02-19: 10 mg via ORAL
  Filled 2018-02-19: qty 2

## 2018-02-19 MED ORDER — VITAMIN D 1000 UNITS PO CAPS
2000.0000 [IU] | ORAL_CAPSULE | Freq: Every day | ORAL | Status: DC
Start: 2018-02-19 — End: 2018-02-19

## 2018-02-19 MED ORDER — VITAMIN D 1000 UNITS PO TABS
2000.0000 [IU] | ORAL_TABLET | Freq: Every day | ORAL | Status: DC
  Administered 2018-02-19 – 2018-02-21 (×3): 2000 [IU] via ORAL
  Filled 2018-02-19 (×3): qty 2

## 2018-02-19 MED ORDER — POTASSIUM CHLORIDE CRYS ER 20 MEQ PO TBCR
40.0000 meq | EXTENDED_RELEASE_TABLET | Freq: Once | ORAL | Status: AC
  Administered 2018-02-19: 40 meq via ORAL
  Filled 2018-02-19: qty 2

## 2018-02-19 MED ORDER — CLOPIDOGREL BISULFATE 75 MG PO TABS: 75.0000 mg | ORAL_TABLET | Freq: Every day | ORAL | Status: DC

## 2018-02-19 MED ORDER — AMLODIPINE BESYLATE 10 MG PO TABS
10.0000 mg | ORAL_TABLET | Freq: Every day | ORAL | Status: DC
  Administered 2018-02-20 – 2018-02-21 (×2): 10 mg via ORAL
  Filled 2018-02-19: qty 1
  Filled 2018-02-19: qty 2

## 2018-02-19 MED ORDER — ASPIRIN 81 MG PO CHEW
81.0000 mg | CHEWABLE_TABLET | Freq: Every day | ORAL | Status: DC
  Administered 2018-02-19 – 2018-02-21 (×3): 81 mg via ORAL
  Filled 2018-02-19 (×4): qty 1

## 2018-02-19 MED ORDER — LISINOPRIL-HYDROCHLOROTHIAZIDE 20-25 MG PO TABS: 2.0000 | ORAL_TABLET | Freq: Every day | ORAL | Status: DC

## 2018-02-19 MED ORDER — ASPIRIN 81 MG PO TABS: 81.0000 mg | ORAL_TABLET | Freq: Every day | ORAL | Status: DC

## 2018-02-19 MED ORDER — CLOPIDOGREL BISULFATE 75 MG PO TABS
75.0000 mg | ORAL_TABLET | Freq: Every day | ORAL | Status: DC
  Administered 2018-02-20 – 2018-02-21 (×2): 75 mg via ORAL
  Filled 2018-02-19 (×3): qty 1

## 2018-02-19 MED ORDER — HYDROCHLOROTHIAZIDE 25 MG PO TABS
25.0000 mg | ORAL_TABLET | Freq: Every day | ORAL | Status: DC
  Administered 2018-02-20: 25 mg via ORAL
  Filled 2018-02-19 (×2): qty 1

## 2018-02-19 MED ORDER — METOPROLOL TARTRATE 25 MG PO TABS
100.0000 mg | ORAL_TABLET | Freq: Once | ORAL | Status: AC
  Administered 2018-02-19: 100 mg via ORAL
  Filled 2018-02-19: qty 4

## 2018-02-19 MED ORDER — LISINOPRIL 20 MG PO TABS
20.0000 mg | ORAL_TABLET | Freq: Every day | ORAL | Status: DC
  Administered 2018-02-20 – 2018-02-21 (×2): 20 mg via ORAL
  Filled 2018-02-19 (×2): qty 1

## 2018-02-19 MED ORDER — HYDRALAZINE HCL 20 MG/ML IJ SOLN
10.0000 mg | INTRAMUSCULAR | Status: DC | PRN
  Administered 2018-02-19: 10 mg via INTRAVENOUS
  Filled 2018-02-19: qty 1

## 2018-02-19 MED ORDER — ROSUVASTATIN CALCIUM 20 MG PO TABS
40.0000 mg | ORAL_TABLET | Freq: Every day | ORAL | Status: DC
  Administered 2018-02-20 – 2018-02-21 (×2): 40 mg via ORAL
  Filled 2018-02-19: qty 2
  Filled 2018-02-19: qty 1
  Filled 2018-02-19 (×2): qty 2

## 2018-02-19 MED ORDER — SPIRONOLACTONE 25 MG PO TABS
50.0000 mg | ORAL_TABLET | Freq: Every day | ORAL | Status: DC
  Administered 2018-02-19 – 2018-02-20 (×2): 50 mg via ORAL
  Filled 2018-02-19 (×2): qty 0.5
  Filled 2018-02-19: qty 2
  Filled 2018-02-19: qty 0.5
  Filled 2018-02-19: qty 1

## 2018-02-19 MED ORDER — VENLAFAXINE HCL ER 37.5 MG PO CP24
37.5000 mg | ORAL_CAPSULE | Freq: Every day | ORAL | Status: DC
  Administered 2018-02-20 – 2018-02-21 (×2): 37.5 mg via ORAL
  Filled 2018-02-19 (×3): qty 1

## 2018-02-19 NOTE — Progress Notes (Signed)
Patient accepted for transfer to [facility], but has not formally been evaluated. Formal orders (sign-helds) can be placed once the patient arrives at [facility]. Current ER policy is that boarding patients awaiting for beds are still under the care of the ER physicians. I have placed a PRN order for hydralazine for BP management. Please direct further order requests to the ER providers until the patient transfers to [facility].  Thanks.  Pixie Casino, MD, [facility], [place] Director of the Advanced Lipid Disorders &  Cardiovascular Risk Reduction Clinic Diplomate of the American Board of Clinical Lipidology Attending Cardiologist  Direct Dial: [phone]  Fax: [phone]  Website:  [email or web address]

## 2018-02-19 NOTE — ED Notes (Signed)
Brenda [name] from patient experience rounded on patient and family.  Brenda [name] told me patient is upset no one has communicated well with them today, no name on the white board, and they don't know what they are waiting for.  I rounded on patient and family, explained that they are waiting on a bed at [facility] for cardiology, and waiting on a repeat Troponin. 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  Pt did acknowledge that she took her own BP med just prior to coming to ED and that she knew her BP would be going down"

## 2018-02-19 NOTE — ED Notes (Addendum)
I spoke to [facility] bed placement, no tele beds avail tonight.  Dr Cathi Roan asked to have the [facility] [place] call cardiology and come to see the patient here.  Pt has been accepted previously today, but has not been seen by cardiology. Arranging to get patient a hospital bed while she holds in the ED tonight.  Pt haas a dinner tray.  She was yelling that no one took her sweat pants off of her yet, they only put her into a gown . Pt said she just wanted someone to listen to her, so I quietly listened  To everything she said about the dayshifts poor communication. Pt yelled at me saying  " You're no help, Get the fuck out of my room. You all cant even put names on the board on the wall!" . I asked the patient to please let me know what she was angry about, I went to get you answers and I have them and would just like the opportunity to tell you what I found, and she said I dont want to hear anything from you".  I left the room. Pts cousin came out into the hall, and I explained that we were contacting Cardiology to try to have them come here to see the patient tonight, and that she would be in the ED tonight." Family was polite and voiced understanding.  She apologized the patient is " just frustrated she has been here all day" .  Dr Cathi Roan spoke to patient and advised of positive troponin and her plan of care in the ED until she transfers".

## 2018-02-19 NOTE — ED Triage Notes (Signed)
Pt complains of chest tightness in chest while walking this morning. Pt states the tightness has subsided. Pt states the pain stopped after her walk. Pt denies shortness of breath.

## 2018-02-19 NOTE — ED Notes (Addendum)
[facility] [place] spoke to Cardiology, they will not be coming to see patient at [facility]. She will continue to hold for [facility] transfer when a tele bed opens.   I informed the patient, she voiced understanding.  BP 169/63 at this time, patient pleased.  She apologized for her earlier outburst from frustration.

## 2018-02-19 NOTE — ED Notes (Signed)
PT is asympotomatic currently. No SOB or CP noted at this time. Pt is still HTN and cardiology is putting in signed and helds currently to monitor BP for pt.

## 2018-02-19 NOTE — ED Notes (Signed)
Patient is comfortable and resting in room on hospital bed

## 2018-02-19 NOTE — ED Notes (Signed)
Admitting physician notified about signed and helds and HTN. Waiting a a call back.

## 2018-02-19 NOTE — ED Notes (Signed)
I was called to patients room by Wendelyn Breslow, tech for upset family.  Pt very aggitated, yelling that she has been here all day. She c/o " neck is tight after getting last dose of medication"  RN Colletta Maryland made aware and at bedside. She provided a warm compress. Pt immediately stated " that isnt helping".   I asked for a few minutes to review the chart and speak to the provider and I would get right back to her with information.

## 2018-02-19 NOTE — ED Provider Notes (Addendum)
[facility] DEPT Provider Note   CSN: [id number] Arrival date & time: 02/19/18  4401     History   Chief Complaint Chief Complaint  Patient presents with  . Chest Pain    HPI Brenda [name] is a 62 y.o. female who presents with chest tightness. PMH significant for CAD s/p CABG, CHF, HTN, HLD, hx of CVA, PAD s/p bilateral common iliac stenting, carotid artery stenosis, hx of breast cancer. She states that for the past couple weeks she's had aching in her bilateral arms and some indigestion. This happens at randomly and has no association with exertion. She reports being quite active and does yoga and goes on walks several times a week. She notes that she's been more SOB when going up hills on her walk which is not normal. Today she had an acute onset of chest tightness this morning around 8am while walking. It spontaneously resolved when she rested and she has not had a reoccurrence. She has missed last night's dose of blood pressure medicines. She thinks it's Amlodipine and Metoprolol. She has been taking the Lisinopril/hctz. She denies fever, chills, syncope, cough, current SOB or chest pain, leg swelling. She missed her last appt with Dr. Stanford Breed in Jan.  HPI  Past Medical History:  Diagnosis Date  . Cancer [facility])    breast cancer  . Carotid artery stenosis   . CHF (congestive heart failure) ([place])   . Coronary artery disease   . Depression   . Hypercholesteremia   . Hypertension   . Hyperthyroidism   . Stroke [facility])     Patient Active Problem List   Diagnosis Date Noted  . Lesion of pancreas 06/22/2017  . Constipation 06/22/2017  . Peripheral vascular disease ([place]) 06/01/2017  . Left leg pain 05/10/2017  . Allergic rhinitis 02/25/2016  . Low back pain 09/29/2015  . Carpal tunnel syndrome, right 08/20/2015  . Hx of completed stroke 05/21/2015  . Memory impairment 05/21/2015  . Hyperthyroidism 12/29/2008  . HYPERCHOLESTEROLEMIA 12/29/2008  .  Anxiety 12/29/2008  . Essential hypertension 12/29/2008  . Coronary atherosclerosis 12/29/2008  . CAROTID ARTERY STENOSIS 12/29/2008    Past Surgical History:  Procedure Laterality Date  . ABDOMINAL AORTOGRAM W/LOWER EXTREMITY N/A 06/13/2017   Procedure: ABDOMINAL AORTOGRAM W/LOWER EXTREMITY;  Surgeon: Waynetta Sandy, MD;  Location: [place] [place] CV LAB;  Service: Cardiovascular;  Laterality: N/A;  Bilateral  . ABDOMINAL HYSTERECTOMY    . BREAST LUMPECTOMY  1999  . BREAST LUMPECTOMY     left breast  . CARDIAC SURGERY    . CAROTID ENDARTERECTOMY  04-2000   left  . CORONARY ARTERY BYPASS GRAFT  2001   x 5 (left internal mammary artery to left anterior  descending coronary artery  . PERIPHERAL VASCULAR INTERVENTION  06/13/2017   Procedure: PERIPHERAL VASCULAR INTERVENTION;  Surgeon: Waynetta Sandy, MD;  Location: [place] CV LAB;  Service: Cardiovascular;;  Bilateral Iliac Stents     OB History   None      Home Medications    Prior to Admission medications   Medication Sig Start Date End Date Taking? Authorizing Provider  amLODipine (NORVASC) 10 MG tablet Take 10 mg by mouth daily.    [provider]  aspirin 81 MG tablet Take 81 mg by mouth daily.      [provider]  Cholecalciferol (VITAMIN D) 1000 UNITS capsule Take 2,000 Units by mouth daily.     [provider]  clopidogrel (PLAVIX) 75 MG tablet  Take 1 tablet (75 mg total) by mouth daily. 06/13/17 06/13/18  Waynetta Sandy, MD  lisinopril-hydrochlorothiazide (PRINZIDE,ZESTORETIC) 20-25 MG tablet Take 2 tablets by mouth daily. 11/21/16   Lelon Perla, MD  metoprolol succinate (TOPROL-XL) 100 MG 24 hr tablet TAKE 1 TABLET BY MOUTH ONCE DAILY WITH  OR  IMMEDIATELY  FOLLOWING  A  MEAL 02/18/18   Lelon Perla, MD  Multiple Vitamin (MULTIVITAMIN) tablet Take 1 tablet by mouth daily.      [provider]  nitroGLYCERIN (NITROSTAT) 0.4 MG SL tablet Place 1  tablet (0.4 mg total) under the tongue every 5 (five) minutes as needed for chest pain (x 3 tabs daily). 08/20/15   [name], MD  rosuvastatin (CRESTOR) 40 MG tablet Take 1 tablet (40 mg total) by mouth daily. 06/18/14   Lelon Perla, MD  spironolactone (ALDACTONE) 25 MG tablet TAKE 2 TABLETS BY MOUTH ONCE DAILY Patient not taking: Reported on 12/19/2017 10/09/17   Lelon Perla, MD  venlafaxine XR (EFFEXOR XR) 37.5 MG 24 hr capsule Take 1 capsule (37.5 mg total) by mouth daily with breakfast. Patient not taking: Reported on 12/19/2017 05/10/17   Lyndal Pulley, DO    Family History Family History  Problem Relation Age of Onset  . Lung cancer Mother        died at an early age  . Cancer Mother        lung  . Other Unknown        There is no Hx of premature coronary artery disease in her family    Social History Social History   Tobacco Use  . Smoking status: Former Smoker    Types: Cigarettes    Last attempt to quit: 09/11/2001    Years since quitting: 16.4  . Smokeless tobacco: Never Used  Substance Use Topics  . Alcohol use: No  . Drug use: No     Allergies   Latex   Review of Systems Review of Systems  Constitutional: Negative for chills and fever.  Respiratory: Positive for chest tightness (resolved) and shortness of breath. Negative for cough and wheezing.   Cardiovascular: Negative for chest pain, palpitations and leg swelling.  Gastrointestinal: Negative for abdominal pain, nausea and vomiting.  Neurological: Negative for syncope.  All other systems reviewed and are negative.    Physical Exam Updated Vital Signs BP (!) 201/86 (BP Location: Right Arm)   Pulse 66   Temp 97.7 F (36.5 C) (Oral)   Resp 15   SpO2 100%   Physical Exam  Constitutional: She is oriented to person, place, and time. She appears well-developed and well-nourished. No distress.  Calm and cooperative  HENT:  Head: Normocephalic and atraumatic.  Eyes: Pupils are  equal, round, and reactive to light. Conjunctivae are normal. Right eye exhibits no discharge. Left eye exhibits no discharge. No scleral icterus.  Neck: Normal range of motion.  Cardiovascular: Normal rate and regular rhythm.  Pulmonary/Chest: Effort normal and breath sounds normal. No respiratory distress.  Surgical scar on chest noted  Abdominal: Soft. Bowel sounds are normal. She exhibits no distension. There is no tenderness.  Musculoskeletal:  No peripheral edema  Neurological: She is alert and oriented to person, place, and time.  Skin: Skin is warm and dry.  Psychiatric: She has a normal mood and affect. Her behavior is normal.  Nursing note and vitals reviewed.    ED Treatments / Results  Labs (all labs ordered are listed, but only  abnormal results are displayed) Labs Reviewed  BASIC METABOLIC PANEL - Abnormal; Notable for the following components:      Result Value   Potassium 3.3 (*)    All other components within normal limits  I-STAT TROPONIN, ED - Abnormal; Notable for the following components:   Troponin i, poc 0.15 (*)    All other components within normal limits  CBC    EKG EKG Interpretation  Date/Time:  Tuesday February 19 2018 08:43:37 EDT Ventricular Rate:  81 PR Interval:    QRS Duration: 75 QT Interval:  392 QTC Calculation: 455 R Axis:   59 Text Interpretation:  Sinus rhythm Biatrial enlargement Abnormal R-wave progression, early transition Probable LVH with secondary repol abnrm Confirmed by Virgel Manifold [phone]) on 02/19/2018 12:17:02 PM   Radiology Dg Chest 2 View  Result Date: 02/19/2018 CLINICAL DATA:  Chest tightness and shortness of breath EXAM: CHEST - 2 VIEW COMPARISON:  November 28, 2003 FINDINGS: Lungs are clear. Heart is upper normal in size with pulmonary vascularity normal. No adenopathy. There is aortic atherosclerosis. Patient is status post coronary artery bypass grafting. There are surgical clips over the left axillary region.  IMPRESSION: No edema or consolidation. Areas of postoperative change. There is aortic atherosclerosis. Aortic Atherosclerosis (ICD10-I70.0). Electronically Signed   By: Lowella Grip III M.D.   On: 02/19/2018 09:24    Procedures Procedures (including critical care time)  CRITICAL CARE Performed by: Recardo Evangelist   Total critical care time: 30 minutes  Critical care time was exclusive of separately billable procedures and treating other patients.  Critical care was necessary to treat or prevent imminent or life-threatening deterioration.  Critical care was time spent personally by me on the following activities: development of treatment plan with patient and/or surrogate as well as nursing, discussions with consultants, evaluation of patient's response to treatment, examination of patient, obtaining history from patient or surrogate, ordering and performing treatments and interventions, ordering and review of laboratory studies, ordering and review of radiographic studies, pulse oximetry and re-evaluation of patient's condition.   Medications Ordered in ED Medications  amLODipine (NORVASC) tablet 10 mg (10 mg Oral Given 02/19/18 1228)  metoprolol tartrate (LOPRESSOR) tablet 100 mg (100 mg Oral Given 02/19/18 1228)     Initial Impression / Assessment and Plan / ED Course  I have reviewed the triage vital signs and the nursing notes.  Pertinent labs & imaging results that were available during my care of the patient were reviewed by me and considered in my medical decision making (see chart for details).  62 year old presents with episode of chest tightness starting this morning while walking. Her blood pressure has also been markedly elevated here with systolic BP often over 166. She was given a dose of her missed home meds. Her EKG is SR with LVH, bi-atrial enlargement, abnormal R wave. CXR is negative. Point of care troponin was elevated to 0.15. Rest of labs are unremarkable  other than mild hypokalemia (3.3). She is symptom free at this time. Discussed with Dr. Harrington Challenger with Cardiology who recommends admission, preferably at [facility]. Discussed results with the patient. She will be transferred to [facility].  Final Clinical Impressions(s) / ED Diagnoses   Final diagnoses:  Elevated troponin  Hypertensive emergency    ED Discharge Orders    None       Iris Pert 02/19/18 1437    Virgel Manifold, MD 02/20/18 0716    Recardo Evangelist, PA-C 03/14/18 1628  Virgel Manifold, MD 03/18/18 [phone]

## 2018-02-19 NOTE — ED Notes (Signed)
Updated family, we are still waiting to hear back from the [facility] and Cardiology.

## 2018-02-20 ENCOUNTER — Observation Stay (HOSPITAL_BASED_OUTPATIENT_CLINIC_OR_DEPARTMENT_OTHER): Payer: Medicare Other

## 2018-02-20 ENCOUNTER — Other Ambulatory Visit (HOSPITAL_COMMUNITY): Payer: Medicare Other

## 2018-02-20 ENCOUNTER — Encounter (HOSPITAL_COMMUNITY): Payer: Self-pay | Admitting: Cardiology

## 2018-02-20 DIAGNOSIS — E785 Hyperlipidemia, unspecified: Secondary | ICD-10-CM

## 2018-02-20 DIAGNOSIS — R079 Chest pain, unspecified: Secondary | ICD-10-CM | POA: Diagnosis present

## 2018-02-20 DIAGNOSIS — I340 Nonrheumatic mitral (valve) insufficiency: Secondary | ICD-10-CM | POA: Diagnosis not present

## 2018-02-20 DIAGNOSIS — I10 Essential (primary) hypertension: Secondary | ICD-10-CM

## 2018-02-20 LAB — CBC WITH DIFFERENTIAL/PLATELET
Abs Immature Granulocytes: 0 10*3/uL (ref 0.0–0.1)
Basophils Absolute: 0 10*3/uL (ref 0.0–0.1)
Basophils Relative: 0 %
Eosinophils Absolute: 0 10*3/uL (ref 0.0–0.7)
Eosinophils Relative: 1 %
HCT: 43.6 % (ref 36.0–46.0)
Hemoglobin: 14.2 g/dL (ref 12.0–15.0)
Immature Granulocytes: 0 %
Lymphocytes Relative: 28 %
Lymphs Abs: 1.7 10*3/uL (ref 0.7–4.0)
MCH: 30.2 pg (ref 26.0–34.0)
MCHC: 32.6 g/dL (ref 30.0–36.0)
MCV: 92.8 fL (ref 78.0–100.0)
Monocytes Absolute: 0.8 10*3/uL (ref 0.1–1.0)
Monocytes Relative: 13 %
Neutro Abs: 3.6 10*3/uL (ref 1.7–7.7)
Neutrophils Relative %: 58 %
Platelets: 216 10*3/uL (ref 150–400)
RBC: 4.7 MIL/uL (ref 3.87–5.11)
RDW: 12.5 % (ref 11.5–15.5)
WBC: 6.1 10*3/uL (ref 4.0–10.5)

## 2018-02-20 LAB — TROPONIN I
Troponin I: 0.08 ng/mL (ref <0.03)
Troponin I: 0.08 ng/mL (ref <0.03)

## 2018-02-20 LAB — I-STAT TROPONIN, ED: Troponin i, poc: 0.06 ng/mL (ref 0.00–0.08)

## 2018-02-20 LAB — ECHOCARDIOGRAM COMPLETE
Height: 65 in
Weight: 2529.6 oz

## 2018-02-20 LAB — TSH: TSH: 1.465 u[IU]/mL (ref 0.350–4.500)

## 2018-02-20 LAB — CREATININE, SERUM
Creatinine, Ser: 0.9 mg/dL (ref 0.44–1.00)
GFR calc Af Amer: >60 mL/min (ref >60)
GFR calc non Af Amer: >60 mL/min (ref >60)

## 2018-02-20 MED ORDER — ONDANSETRON HCL 4 MG/2ML IJ SOLN: 4.0000 mg | Freq: Four times a day (QID) | INTRAMUSCULAR | Status: DC | PRN

## 2018-02-20 MED ORDER — HEPARIN SODIUM (PORCINE) 5000 UNIT/ML IJ SOLN
5000.0000 [IU] | Freq: Three times a day (TID) | INTRAMUSCULAR | Status: DC
  Administered 2018-02-20 – 2018-02-21 (×3): 5000 [IU] via SUBCUTANEOUS
  Filled 2018-02-20 (×3): qty 1

## 2018-02-20 MED ORDER — ASPIRIN 300 MG RE SUPP: 300.0000 mg | RECTAL | Status: AC

## 2018-02-20 MED ORDER — NITROGLYCERIN 0.4 MG SL SUBL: 0.4000 mg | SUBLINGUAL_TABLET | SUBLINGUAL | Status: DC | PRN

## 2018-02-20 MED ORDER — ASPIRIN EC 81 MG PO TBEC: 81.0000 mg | DELAYED_RELEASE_TABLET | Freq: Every day | ORAL | Status: DC

## 2018-02-20 MED ORDER — METOPROLOL SUCCINATE ER 100 MG PO TB24
100.0000 mg | ORAL_TABLET | Freq: Every day | ORAL | Status: DC
  Administered 2018-02-20 – 2018-02-21 (×2): 100 mg via ORAL
  Filled 2018-02-20 (×2): qty 1

## 2018-02-20 MED ORDER — SODIUM CHLORIDE 0.9 % IV SOLN: 250.0000 mL | INTRAVENOUS | Status: DC | PRN

## 2018-02-20 MED ORDER — SODIUM CHLORIDE 0.9% FLUSH
3.0000 mL | Freq: Two times a day (BID) | INTRAVENOUS | Status: DC
  Administered 2018-02-20: 3 mL via INTRAVENOUS

## 2018-02-20 MED ORDER — SODIUM CHLORIDE 0.9% FLUSH: 3.0000 mL | INTRAVENOUS | Status: DC | PRN

## 2018-02-20 MED ORDER — ASPIRIN 81 MG PO CHEW: 324.0000 mg | CHEWABLE_TABLET | ORAL | Status: AC

## 2018-02-20 MED ORDER — ACETAMINOPHEN 325 MG PO TABS: 650.0000 mg | ORAL_TABLET | ORAL | Status: DC | PRN

## 2018-02-20 MED ORDER — SODIUM CHLORIDE 0.9 % IV SOLN
INTRAVENOUS | Status: DC
  Administered 2018-02-21: via INTRAVENOUS

## 2018-02-20 NOTE — Care Management CC44 (Signed)
Condition Code 44 Documentation Completed  Patient Details  Name: Brenda [name] MRN: [id number] Date of Birth: 1956-08-23   Condition Code 44 given:  Yes Patient signature on Condition Code 44 notice:  Yes Documentation of 2 MD's agreement:  Yes Code 44 added to claim:  Yes    Erenest Rasher, RN 02/20/2018, 2:57 PM

## 2018-02-20 NOTE — Care Management Note (Signed)
Case Management Note  Patient Details  Name: Brenda [name] MRN: [id number] Date of Birth: 1956/06/21  Subjective/Objective:   Chest pain, hx CAD with stents                 Action/Plan: NCM spoke to pt and she is independent at home. No NCM needs anticipated. Scheduled for cardiac cath on 02/21/2018. Will monitor for medication change.   Expected Discharge Date:  (unknown)               Expected Discharge Plan:  Home/Self Care  In-House Referral:  NA  Discharge planning Services  CM Consult  Post Acute Care Choice:  NA Choice offered to:  NA  DME Arranged:  N/A DME Agency:  NA  HH Arranged:  NA HH Agency:  NA  Status of Service:  Completed, signed off  If discussed at [place] of Stay Meetings, dates discussed:    Additional Comments:  Erenest Rasher, RN 02/20/2018, 7:10 PM

## 2018-02-20 NOTE — Progress Notes (Signed)
  Echocardiogram 2D Echocardiogram has been performed.  Brenda [name] 02/20/2018, 1:58 PM

## 2018-02-20 NOTE — ED Notes (Signed)
Pt has left and is being loaded on [facility].

## 2018-02-20 NOTE — ED Notes (Signed)
[facility] has been notified and will send truck for transport ASAP.

## 2018-02-20 NOTE — Care Management Obs Status (Signed)
[place] NOTIFICATION   Patient Details  Name: Brenda [name] MRN: [id number] Date of Birth: 06/30/1956   Medicare Observation Status Notification Given:  Yes    Erenest Rasher, RN 02/20/2018, 2:57 PM

## 2018-02-20 NOTE — ED Notes (Signed)
Report given to Reubin Milan RN.

## 2018-02-20 NOTE — H&P (Addendum)
History & Physical    Patient ID: Brenda [name] MRN: [id number], DOB/AGE: 64/18/1957   Admit date: 02/19/2018 Primary Physician: [name], MD Primary Cardiologist: Dr. Kirk Ruths, MD  Patient Profile    Brenda [name] is a 63 year old female with a history of CAD s/p CABG (2001), HTN, HLD, history of CVA, PAD s/p bilateral common iliac stenting, carotid artery disease (followed by Dr. Donzetta Matters) and history of breast CA who presented to [facility] on 02/19/2018 with complaints of chest pain.  Past Medical History   Past Medical History:  Diagnosis Date  . Cancer [facility])    breast cancer  . Carotid artery stenosis   . CHF (congestive heart failure) ([place])   . Coronary artery disease   . Depression   . Hypercholesteremia   . Hypertension   . Hyperthyroidism   . Stroke [facility])     Past Surgical History:  Procedure Laterality Date  . ABDOMINAL AORTOGRAM W/LOWER EXTREMITY N/A 06/13/2017   Procedure: ABDOMINAL AORTOGRAM W/LOWER EXTREMITY;  Surgeon: Waynetta Sandy, MD;  Location: [facility];  Service: Cardiovascular;  Laterality: N/A;  Bilateral  . ABDOMINAL HYSTERECTOMY    . BREAST LUMPECTOMY  1999  . BREAST LUMPECTOMY     left breast  . CARDIAC SURGERY    . CAROTID ENDARTERECTOMY  04-2000   left  . CORONARY ARTERY BYPASS GRAFT  2001   x 5 (left internal mammary artery to left anterior  descending coronary artery  . PERIPHERAL VASCULAR INTERVENTION  06/13/2017   Procedure: PERIPHERAL VASCULAR INTERVENTION;  Surgeon: Waynetta Sandy, MD;  Location: [place] [facility];  Service: Cardiovascular;;  Bilateral Iliac Stents     Allergies  Allergies  Allergen Reactions  . Latex     unknown    History of Present Illness    Brenda [name] is a 62 year old female with a history stated above who presented to [facility] emergency department on 02/19/2018 with complaints of an acute episode of anterior chest tightness around 8 AM on day of admission while walking which  spontaneously resolved with rest. She stopped walking She describes herself as an avid exerciser, walking or doing yoga everyday of the week. Up until yesterday, she has not experienced any chest tightness with her activities.  Last week she did note taht she got SOB with walking up incline   She had not had in past She does state also  that she has also had several weeks of bilateral arm aching/tingling and episodic indigestion, which both occur together. She denies radiation of her chest tightness and has had no recurrence since this episode. She denies  LE swelling, orthopnea, palpitations, dizziness or syncope.   Has also noted some increased belching recently   No signfiicant reflux   She denies tobacco or alcohol use She reports a brief 2 to 3-week.  Where she ran out of her antihypertensive medications however, otherwise is compliant with education, diet and exercise recommendations.   Of note, she was last seen by Dr. Stanford Breed in January 2018.  Per chart review, echocardiogram in October 2010 showed normal LV function and mild mitral regurgitation. Renal Dopplers in January 2012 showed normal renal arteries and no abdominal aortic aneurysm. Carotid Dopplers in November 2016 showed 40 to 59% right and 1 to 30% left stenosis. She had a nuclear study in November 2016 which showed a small apical defect felt secondary to soft tissue attenuation but mild ischemia could not be excluded.She is also followed by Dr. Donzetta Matters for  her vascular disease.  Last seen 06/08/2017 with plans to proceed with aortogram and bilateral iliac artery stenting which was completed 06/13/2017.  Home Medications    Prior to Admission medications   Medication Sig Start Date End Date Taking? Authorizing Provider  amLODipine (NORVASC) 10 MG tablet Take 10 mg by mouth daily.   Yes [provider]  aspirin 81 MG tablet Take 81 mg by mouth daily.     Yes [provider]  Cholecalciferol (VITAMIN D) 1000 UNITS capsule Take  2,000 Units by mouth daily.    Yes [provider]  clopidogrel (PLAVIX) 75 MG tablet Take 1 tablet (75 mg total) by mouth daily. 06/13/17 06/13/18 Yes Waynetta Sandy, MD  lisinopril-hydrochlorothiazide (PRINZIDE,ZESTORETIC) 20-25 MG tablet Take 2 tablets by mouth daily. 11/21/16  Yes Lelon Perla, MD  metoprolol succinate (TOPROL-XL) 100 MG 24 hr tablet TAKE 1 TABLET BY MOUTH ONCE DAILY WITH  OR  IMMEDIATELY  FOLLOWING  A  MEAL 02/18/18  Yes Lelon Perla, MD  Multiple Vitamin (MULTIVITAMIN) tablet Take 1 tablet by mouth daily.     Yes [provider]  OVER THE COUNTER MEDICATION Take 1 tablet by mouth daily as needed (back pain). Back Pain OTC medication   Yes [provider]  rosuvastatin (CRESTOR) 40 MG tablet Take 1 tablet (40 mg total) by mouth daily. 06/18/14  Yes Lelon Perla, MD  nitroGLYCERIN (NITROSTAT) 0.4 MG SL tablet Place 1 tablet (0.4 mg total) under the tongue every 5 (five) minutes as needed for chest pain (x 3 tabs daily). Patient not taking: Reported on 02/19/2018 08/20/15   [name], MD  spironolactone (ALDACTONE) 25 MG tablet TAKE 2 TABLETS BY MOUTH ONCE DAILY Patient not taking: Reported on 12/19/2017 10/09/17   Lelon Perla, MD  venlafaxine XR (EFFEXOR XR) 37.5 MG 24 hr capsule Take 1 capsule (37.5 mg total) by mouth daily with breakfast. Patient not taking: Reported on 12/19/2017 05/10/17   Lyndal Pulley, DO   Family History    Family History  Problem Relation Age of Onset  . Lung cancer Mother        died at an early age  . Cancer Mother        lung  . Other Unknown        There is no Hx of premature coronary artery disease in her family   Social History    Social History   Socioeconomic History  . Marital status: Single    Spouse name: Not on file  . Number of children: 1  . Years of education: Not on file  . Highest education level: Not on file  Occupational History  . Occupation: Sales promotion account executive: UNEMPLOYED  Social Needs  . Financial resource strain: Not on file  . Food insecurity:    Worry: Not on file    Inability: Not on file  . Transportation needs:    Medical: Not on file    Non-medical: Not on file  Tobacco Use  . Smoking status: Former Smoker    Types: Cigarettes    Last attempt to quit: 09/11/2001    Years since quitting: 16.4  . Smokeless tobacco: Never Used  Substance and Sexual Activity  . Alcohol use: No  . Drug use: No  . Sexual activity: Not on file  Lifestyle  . Physical activity:    Days per week: Not on file    Minutes per session: Not on  file  . Stress: Not on file  Relationships  . Social connections:    Talks on phone: Not on file    Gets together: Not on file    Attends religious service: Not on file    Active member of club or organization: Not on file    Attends meetings of clubs or organizations: Not on file    Relationship status: Not on file  . Intimate partner violence:    Fear of current or ex partner: Not on file    Emotionally abused: Not on file    Physically abused: Not on file    Forced sexual activity: Not on file  Other Topics Concern  . Not on file  Social History Narrative  . Not on file     Review of Systems   See HPI as above, All other systems reviewed and are otherwise negative except as noted above.  Physical Exam    Blood pressure (!) 144/83, pulse 60, temperature 97.7 F (36.5 C), temperature source Oral, resp. rate 14, SpO2 97 %.   General: Well developed, well nourished, NAD Skin: Warm, dry, intact  Head: Normocephalic, atraumatic, sclear, moist mucus membranes. Neck: Negative for carotid bruits. No JVD Lungs:Clear to ausculation bilaterally. No wheezes, rales, or rhonchi. Breathing is unlabored. Cardiovascular: RRR with S1 S2. No murmurs, rubs or  Gallops Abdomen: Soft, non-tender, non-distended with normoactive bowel sounds. No obvious abdominal masses. MSK: Strength and tone appear  normal for age. 5/5 in all extremities Extremities: No edema. No clubbing or cyanosis. DP/PT pulses 2+ bilaterally Neuro: Alert and oriented. No focal deficits. No facial asymmetry. MAE spontaneously. Psych: Responds to questions appropriately with normal affect.    Labs    Troponin [facility] of Care Test) Recent Labs    02/19/18 1857  TROPIPOC 0.12*   No results for input(s): CKTOTAL, CKMB, TROPONINI in the last 72 hours. Lab Results  Component Value Date   WBC 5.8 02/19/2018   HGB 13.9 02/19/2018   HCT 41.4 02/19/2018   MCV 92.8 02/19/2018   PLT 203 02/19/2018    Recent Labs  Lab 02/19/18 1220  NA 139  K 3.3*  CL 102  CO2 28  BUN 16  CREATININE 0.83  CALCIUM 9.5  GLUCOSE 92   Lab Results  Component Value Date   CHOL 156 05/21/2015   HDL 54.10 05/21/2015   LDLCALC 86 05/21/2015   TRIG 78.0 05/21/2015   No results found for: [facility]   Radiology Studies    Dg Chest 2 View  Result Date: 02/19/2018 CLINICAL DATA:  Chest tightness and shortness of breath EXAM: CHEST - 2 VIEW COMPARISON:  November 28, 2003 FINDINGS: Lungs are clear. Heart is upper normal in size with pulmonary vascularity normal. No adenopathy. There is aortic atherosclerosis. Patient is status post coronary artery bypass grafting. There are surgical clips over the left axillary region. IMPRESSION: No edema or consolidation. Areas of postoperative change. There is aortic atherosclerosis. Aortic Atherosclerosis (ICD10-I70.0). Electronically Signed   By: Lowella Grip III M.D.   On: 02/19/2018 09:24   ECG & Cardiac Imaging    Lexiscan Myoview stress test 07/30/2015:  The left ventricular ejection fraction is normal (55-65%).  Nuclear stress EF: 64%.  Defect 1: There is a small defect of mild severity present in the apical anterior location.  This is a low risk study.  EKG 02/19/2018: NSR with evidence of LVH and atrial enlargement(present on prior tracings). There are diffuse T wave inversions,  predominantly in  the inferiolateral leads a change from prior tracings  Assessment & Plan    1.  Chest pain: -Patient presented to [facility] ED on 02/19/2018 with an acute episode of anterior, nonradiating chest tightness while walking which was relieved with rest.  She also states she has had several episodes over the last 6 months of bilateral arm tightness, numbness with associated belching. -Troponin, 0.15>0.12 -EKG, NSR with evidence of LVH and atrial enlargement(present on prior tracings). There are diffuse T wave inversions, consistent with strain pattern -Will obtain echocardiogram -Currently chest pain-free, has had no recurrent episode of chest tightness -Continue ASA, Plavix, lisinopril, statin -Creatinine stable at 0.83  2.  HTN: -Mildly levated, 144/83, 135/74, 140/65, 153/65 -Continue amlodipine, lisinopril, spironolactone -Reports being out of amlodipine 1day   And metoprolol for 2 wks  3.  HLD: -Last lipid panel 2016, CH0- 156, HDL-54, LDL- 86, Trig-78 -Continue rosuvastatin -Will recheck lipids in am   Goal LDL less than 70  4.  PVD: -Stable, followed closely with [facility] Donzetta Matters) -Recent renal artery stent placement 06/2017  5.  Carotid artery disease: -Stable, followed closely with [facility]   Signed, Kathyrn Drown NP-C [place] Pager: [phone] 02/20/2018, @NOW   Pt seen and examined   I have amended note above to reflect my findings  Briefly, pt is a 62 yo female iwht history of remote CABG DOing good until the past several wks   Has noticed bilateral arm discomfort intermitt.   Also noticed increased belching.   Last week she developed increased dysnea on exertion with incline  DId not have before  Yesterday was walkig and developed chest tightnes   Eased off with rest   Of note the pt did run out of metoprolol for about 2 wk   Waiting for pharmacy to call back AMlodipine missed yestreday  On exam, currently she is comfortable and pain free Neck with bilateral  carotid bruids   JVP normal Lungs are CTA  Cardiac RRR   No S3 or signif murmurs   Abd is benign  Ext with no edema  2+pulses  IMPRESSION:  Hx concerning for progressive angina  Troponin is minimally elevated   Of note, pt has missed a couple wks of metoprolol  BP up on admit This may explain above but with vascular hx and elevated troponin I would recomm L heart cath tp evaluate   Risks/benefits described   Pt understands and agrees to proceed.   Plan on AM   Keep on statin   Check lipids   REcom aggressive control.  Dorris Carnes   +

## 2018-02-20 NOTE — ED Provider Notes (Signed)
Patient has been awaiting definitive placement/cardiology consultation for chest pain evaluation.  Evaluating the patient this morning for her overnight course.  Patient denies she had any ongoing chest pain.  She reports she has been symptom-free.  No shortness of breath.  Had pressure has normalized.  Patient reports she thinks a lot of the problems her blood pressure were from 2 issues, one that she had not had her medication due to refill issues and secondarily motional stress regarding her interactions with ED staff overnight.  At this time however she reports she feels symptom-free and much better. Physical Exam  BP (!) 144/83   Pulse 60   Temp 97.7 F (36.5 C) (Oral)   Resp 14   SpO2 97%   Physical Exam  Constitutional: She is oriented to person, place, and time.  Patient sitting at the bedside speaking on the phone.  She is clinically well in appearance.  Well-nourished well-developed.  Mental status is clear.  No respiratory distress.  HENT:  Head: Normocephalic and atraumatic.  Eyes: EOM are normal.  Cardiovascular: Normal rate, regular rhythm, normal heart sounds and intact distal pulses.  Pulmonary/Chest: Effort normal and breath sounds normal.  Musculoskeletal: Normal range of motion. She exhibits no edema or tenderness.  No peripheral edema.  Skin condition of the lower legs and feet is excellent.  Calves are soft and nontender.  Neurological: She is alert and oriented to person, place, and time. She exhibits normal muscle tone. Coordination normal.  Skin: Skin is warm and dry.  Psychiatric: She has a normal mood and affect.    ED Course/Procedures   Clinical Course as of Feb 21 756  Tue Feb 19, 2018  1224 I-stat troponin, ED(!!) [KG]  1224 I-stat troponin, ED(!!) [KG]    Clinical Course User Index [KG] Recardo Evangelist, PA-C    Procedures  MDM  Patient is asymptomatic this morning.  Vital signs are stable. Will recheck troponin. Awaiting final disposition as  per cardiology input.      Charlesetta Shanks, MD 02/20/18 [phone]

## 2018-02-21 ENCOUNTER — Encounter (HOSPITAL_COMMUNITY)
Admission: EM | Disposition: A | Discharge status: Home / Self Care | Payer: Self-pay | Source: Home / Self Care | Attending: Emergency Medicine

## 2018-02-21 ENCOUNTER — Encounter (HOSPITAL_COMMUNITY): Payer: Self-pay | Admitting: Cardiovascular Disease

## 2018-02-21 DIAGNOSIS — I2582 Chronic total occlusion of coronary artery: Secondary | ICD-10-CM | POA: Diagnosis not present

## 2018-02-21 DIAGNOSIS — Z951 Presence of aortocoronary bypass graft: Secondary | ICD-10-CM | POA: Diagnosis not present

## 2018-02-21 DIAGNOSIS — E785 Hyperlipidemia, unspecified: Secondary | ICD-10-CM | POA: Diagnosis not present

## 2018-02-21 DIAGNOSIS — R778 Other specified abnormalities of plasma proteins: Secondary | ICD-10-CM

## 2018-02-21 DIAGNOSIS — I10 Essential (primary) hypertension: Secondary | ICD-10-CM | POA: Diagnosis not present

## 2018-02-21 DIAGNOSIS — R7989 Other specified abnormal findings of blood chemistry: Secondary | ICD-10-CM

## 2018-02-21 DIAGNOSIS — R079 Chest pain, unspecified: Secondary | ICD-10-CM | POA: Diagnosis not present

## 2018-02-21 DIAGNOSIS — I161 Hypertensive emergency: Secondary | ICD-10-CM | POA: Diagnosis not present

## 2018-02-21 DIAGNOSIS — I25110 Atherosclerotic heart disease of native coronary artery with unstable angina pectoris: Secondary | ICD-10-CM | POA: Diagnosis not present

## 2018-02-21 HISTORY — PX: LEFT HEART CATH AND CORS/GRAFTS ANGIOGRAPHY: CATH118250

## 2018-02-21 LAB — CBC
HCT: 41.8 % (ref 36.0–46.0)
Hemoglobin: 13.7 g/dL (ref 12.0–15.0)
MCH: 30.8 pg (ref 26.0–34.0)
MCHC: 32.8 g/dL (ref 30.0–36.0)
MCV: 93.9 fL (ref 78.0–100.0)
Platelets: 210 10*3/uL (ref 150–400)
RBC: 4.45 MIL/uL (ref 3.87–5.11)
RDW: 12.5 % (ref 11.5–15.5)
WBC: 5.7 10*3/uL (ref 4.0–10.5)

## 2018-02-21 LAB — BASIC METABOLIC PANEL
Anion gap: 10 (ref 5–15)
BUN: 16 mg/dL (ref 6–20)
CO2: 25 mmol/L (ref 22–32)
Calcium: 9.3 mg/dL (ref 8.9–10.3)
Chloride: 102 mmol/L (ref 101–111)
Creatinine, Ser: 0.99 mg/dL (ref 0.44–1.00)
GFR calc Af Amer: >60 mL/min (ref >60)
GFR calc non Af Amer: 59 mL/min — ABNORMAL LOW (ref >60)
Glucose, Bld: 100 mg/dL — ABNORMAL HIGH (ref 65–99)
Potassium: 3.7 mmol/L (ref 3.5–5.1)
Sodium: 137 mmol/L (ref 135–145)

## 2018-02-21 LAB — LIPID PANEL
Cholesterol: 122 mg/dL (ref 0–200)
HDL: 40 mg/dL — ABNORMAL LOW (ref >40)
LDL Cholesterol: 69 mg/dL (ref 0–99)
Total CHOL/HDL Ratio: 3.1 RATIO
Triglycerides: 63 mg/dL (ref <150)
VLDL: 13 mg/dL (ref 0–40)

## 2018-02-21 LAB — TROPONIN I: Troponin I: 0.06 ng/mL (ref <0.03)

## 2018-02-21 LAB — HIV ANTIBODY (ROUTINE TESTING W REFLEX): HIV Screen 4th Generation wRfx: NONREACTIVE

## 2018-02-21 SURGERY — LEFT HEART CATH AND CORS/GRAFTS ANGIOGRAPHY
Anesthesia: LOCAL

## 2018-02-21 MED ORDER — SODIUM CHLORIDE 0.9% FLUSH
3.0000 mL | Freq: Two times a day (BID) | INTRAVENOUS | Status: DC
  Administered 2018-02-21: 3 mL via INTRAVENOUS

## 2018-02-21 MED ORDER — LIDOCAINE HCL (PF) 1 % IJ SOLN
INTRAMUSCULAR | Status: AC
  Filled 2018-02-21: qty 30

## 2018-02-21 MED ORDER — ONDANSETRON HCL 4 MG/2ML IJ SOLN
4.0000 mg | Freq: Four times a day (QID) | INTRAMUSCULAR | Status: DC | PRN
Start: 2018-02-21 — End: 2018-02-21

## 2018-02-21 MED ORDER — FENTANYL CITRATE (PF) 100 MCG/2ML IJ SOLN
INTRAMUSCULAR | Status: DC | PRN
  Administered 2018-02-21: 25 ug via INTRAVENOUS

## 2018-02-21 MED ORDER — IOPAMIDOL (ISOVUE-370) INJECTION 76%
INTRAVENOUS | Status: DC | PRN
  Administered 2018-02-21: 105 mL via INTRA_ARTERIAL

## 2018-02-21 MED ORDER — LIDOCAINE HCL (PF) 1 % IJ SOLN
INTRAMUSCULAR | Status: DC | PRN
  Administered 2018-02-21: 15 mL

## 2018-02-21 MED ORDER — ISOSORBIDE MONONITRATE ER 30 MG PO TB24: 15.0000 mg | ORAL_TABLET | Freq: Every day | ORAL | 11 refills | Status: DC

## 2018-02-21 MED ORDER — SODIUM CHLORIDE 0.9% FLUSH: 3.0000 mL | INTRAVENOUS | Status: DC | PRN

## 2018-02-21 MED ORDER — MORPHINE SULFATE (PF) 2 MG/ML IV SOLN: 2.0000 mg | INTRAVENOUS | Status: DC | PRN

## 2018-02-21 MED ORDER — SODIUM CHLORIDE 0.9 % IV SOLN
INTRAVENOUS | Status: AC
  Administered 2018-02-21: 15:00:00 via INTRAVENOUS

## 2018-02-21 MED ORDER — ASPIRIN 81 MG PO CHEW: 81.0000 mg | CHEWABLE_TABLET | Freq: Every day | ORAL | Status: DC

## 2018-02-21 MED ORDER — SODIUM CHLORIDE 0.9 % IV SOLN: 250.0000 mL | INTRAVENOUS | Status: DC | PRN

## 2018-02-21 MED ORDER — MIDAZOLAM HCL 2 MG/2ML IJ SOLN
INTRAMUSCULAR | Status: DC | PRN
  Administered 2018-02-21: 1 mg via INTRAVENOUS

## 2018-02-21 MED ORDER — IOPAMIDOL (ISOVUE-370) INJECTION 76%
INTRAVENOUS | Status: AC
  Filled 2018-02-21: qty 125

## 2018-02-21 MED ORDER — HEPARIN (PORCINE) IN NACL 1000-0.9 UT/500ML-% IV SOLN
INTRAVENOUS | Status: AC
  Filled 2018-02-21: qty 1000

## 2018-02-21 MED ORDER — FENTANYL CITRATE (PF) 100 MCG/2ML IJ SOLN
INTRAMUSCULAR | Status: AC
  Filled 2018-02-21: qty 2

## 2018-02-21 MED ORDER — ACETAMINOPHEN 325 MG PO TABS: 650.0000 mg | ORAL_TABLET | ORAL | Status: DC | PRN

## 2018-02-21 MED ORDER — HEPARIN (PORCINE) IN NACL 2-0.9 UNITS/ML
INTRAMUSCULAR | Status: AC | PRN
  Administered 2018-02-21: 1000 mL

## 2018-02-21 MED ORDER — MIDAZOLAM HCL 2 MG/2ML IJ SOLN
INTRAMUSCULAR | Status: AC
  Filled 2018-02-21: qty 2

## 2018-02-21 SURGICAL SUPPLY — 8 items
CATH INFINITI 5FR MULTPACK ANG (CATHETERS) ×1 IMPLANT
KIT HEART LEFT (KITS) ×2 IMPLANT
PACK CARDIAC CATHETERIZATION (CUSTOM PROCEDURE TRAY) ×2 IMPLANT
SHEATH PINNACLE 5F 10CM (SHEATH) ×1 IMPLANT
SYR MEDRAD MARK V 150ML (SYRINGE) ×2 IMPLANT
TRANSDUCER W/STOPCOCK (MISCELLANEOUS) ×2 IMPLANT
WIRE EMERALD 3MM-J .035X150CM (WIRE) ×1 IMPLANT
WIRE HI TORQ VERSACORE-J 145CM (WIRE) ×1 IMPLANT

## 2018-02-21 NOTE — Progress Notes (Signed)
Discharge order received.  IV and Tele removed.  Discharge instructions reviewed with and given to patient.  Patient take to private vehicle for discharge via wheelchair.

## 2018-02-21 NOTE — Interval H&P Note (Signed)
Cath Lab Visit (complete for each Cath Lab visit)  Clinical Evaluation Leading to the Procedure:   ACS: Yes.    Non-ACS:    Anginal Classification: CCS III  Anti-ischemic medical therapy: Minimal Therapy (1 class of medications)  Non-Invasive Test Results: No non-invasive testing performed  Prior CABG: Previous CABG      History and Physical Interval Note:  02/21/2018 7:32 AM  Brenda [name]  has presented today for surgery, with the diagnosis of cad  The various methods of treatment have been discussed with the patient and family. After consideration of risks, benefits and other options for treatment, the patient has consented to  Procedure(s): LEFT HEART CATH AND CORS/GRAFTS ANGIOGRAPHY (N/A) as a surgical intervention .  The patient's history has been reviewed, patient examined, no change in status, stable for surgery.  I have reviewed the patient's chart and labs.  Questions were answered to the patient's satisfaction.     Quay Burow

## 2018-02-21 NOTE — Discharge Instructions (Signed)
PLEASE REMEMBER TO BRING ALL OF YOUR MEDICATIONS TO EACH OF YOUR FOLLOW-UP OFFICE VISITS.  PLEASE ATTEND ALL SCHEDULED FOLLOW-UP APPOINTMENTS.   Activity: Increase activity slowly as tolerated. You may shower, but no soaking baths (or swimming) for 1 week. No driving for 24 hours. No lifting over 5 lbs for 1 week. No sexual activity for 1 week.   You May Return to Work: in 1 week (if applicable)  Wound Care: You may wash cath site gently with soap and water. Keep cath site clean and dry. If you notice pain, swelling, bleeding or pus at your cath site, please call [phone].   Femoral Site Care Refer to this sheet in the next few weeks. These instructions provide you with information about caring for yourself after your procedure. Your health care provider may also give you more specific instructions. Your treatment has been planned according to current medical practices, but problems sometimes occur. Call your health care provider if you have any problems or questions after your procedure. What can I expect after the procedure? After your procedure, it is typical to have the following:  Bruising at the site that usually fades within 1-2 weeks.  Blood collecting in the tissue (hematoma) that may be painful to the touch. It should usually decrease in size and tenderness within 1-2 weeks.  Follow these instructions at home:  Take medicines only as directed by your health care provider.  You may shower 24-48 hours after the procedure or as directed by your health care provider. Remove the bandage (dressing) and gently wash the site with plain soap and water. Pat the area dry with a clean towel. Do not rub the site, because this may cause bleeding.  Do not take baths, swim, or use a hot tub until your health care provider approves.  Check your insertion site every day for redness, swelling, or drainage.  Do not apply powder or lotion to the site.  Limit use of stairs to twice a day for  the first 2-3 days or as directed by your health care provider.  Do not squat for the first 2-3 days or as directed by your health care provider.  Do not lift over 10 lb (4.5 kg) for 5 days after your procedure or as directed by your health care provider.  Ask your health care provider when it is okay to: ? Return to work or school. ? Resume usual physical activities or sports. ? Resume sexual activity.  Do not drive home if you are discharged the same day as the procedure. Have someone else drive you.  You may drive 24 hours after the procedure unless otherwise instructed by your health care provider.  Do not operate machinery or power tools for 24 hours after the procedure or as directed by your health care provider.  If your procedure was done as an outpatient procedure, which means that you went home the same day as your procedure, a responsible adult should be with you for the first 24 hours after you arrive home.  Keep all follow-up visits as directed by your health care provider. This is important. Contact a health care provider if:  You have a fever.  You have chills.  You have increased bleeding from the site. Hold pressure on the site. Get help right away if:  You have unusual pain at the site.  You have redness, warmth, or swelling at the site.  You have drainage (other than a small amount of blood on the dressing)  from the site.  The site is bleeding, and the bleeding does not stop after 30 minutes of holding steady pressure on the site.  Your leg or foot becomes pale, cool, tingly, or numb. This information is not intended to replace advice given to you by your health care provider. Make sure you discuss any questions you have with your health care provider. Document Released: 05/01/2014 Document Revised: 02/03/2016 Document Reviewed: 03/17/2014 Elsevier Interactive Patient Education  2018 [place].       Femoral Site Care Refer to this sheet in the  next few weeks. These instructions provide you with information about caring for yourself after your procedure. Your health care provider may also give you more specific instructions. Your treatment has been planned according to current medical practices, but problems sometimes occur. Call your health care provider if you have any problems or questions after your procedure. What can I expect after the procedure? After your procedure, it is typical to have the following:  Bruising at the site that usually fades within 1-2 weeks.  Blood collecting in the tissue (hematoma) that may be painful to the touch. It should usually decrease in size and tenderness within 1-2 weeks.  Follow these instructions at home:  Take medicines only as directed by your health care provider.  You may shower 24-48 hours after the procedure or as directed by your health care provider. Remove the bandage (dressing) and gently wash the site with plain soap and water. Pat the area dry with a clean towel. Do not rub the site, because this may cause bleeding.  Do not take baths, swim, or use a hot tub until your health care provider approves.  Check your insertion site every day for redness, swelling, or drainage.  Do not apply powder or lotion to the site.  Limit use of stairs to twice a day for the first 2-3 days or as directed by your health care provider.  Do not squat for the first 2-3 days or as directed by your health care provider.  Do not lift over 10 lb (4.5 kg) for 5 days after your procedure or as directed by your health care provider.  Ask your health care provider when it is okay to: ? Return to work or school. ? Resume usual physical activities or sports. ? Resume sexual activity.  Do not drive home if you are discharged the same day as the procedure. Have someone else drive you.  You may drive 24 hours after the procedure unless otherwise instructed by your health care provider.  Do not operate  machinery or power tools for 24 hours after the procedure or as directed by your health care provider.  If your procedure was done as an outpatient procedure, which means that you went home the same day as your procedure, a responsible adult should be with you for the first 24 hours after you arrive home.  Keep all follow-up visits as directed by your health care provider. This is important. Contact a health care provider if:  You have a fever.  You have chills.  You have increased bleeding from the site. Hold pressure on the site. Get help right away if:  You have unusual pain at the site.  You have redness, warmth, or swelling at the site.  You have drainage (other than a small amount of blood on the dressing) from the site.  The site is bleeding, and the bleeding does not stop after 30 minutes of holding steady pressure  on the site.  Your leg or foot becomes pale, cool, tingly, or numb. This information is not intended to replace advice given to you by your health care provider. Make sure you discuss any questions you have with your health care provider. Document Released: 05/01/2014 Document Revised: 02/03/2016 Document Reviewed: 03/17/2014 Elsevier Interactive Patient Education  2018 [place] [facility]-healthy meal planning includes:  Limiting unhealthy fats.  Increasing healthy fats.  Making other small dietary changes.  You may need to talk with your doctor or a diet specialist (dietitian) to create an eating plan that is right for you. What types of fat should I choose?  Choose healthy fats. These include olive oil and canola oil, flaxseeds, walnuts, almonds, and seeds.  Eat more omega-3 fats. These include salmon, mackerel, sardines, tuna, flaxseed oil, and ground flaxseeds. Try to eat fish at least twice each week.  Limit saturated fats. ? Saturated fats are often found in animal products, such as meats, butter, and cream. ? Plant  sources of saturated fats include palm oil, palm kernel oil, and coconut oil.  Avoid foods with partially hydrogenated oils in them. These include stick margarine, some tub margarines, cookies, crackers, and other baked goods. These contain trans fats. What general guidelines do I need to follow?  Check food labels carefully. Identify foods with trans fats or high amounts of saturated fat.  Fill one half of your plate with vegetables and green salads. Eat 4-5 servings of vegetables per day. A serving of vegetables is: ? 1 cup of raw leafy vegetables. ?  cup of raw or cooked cut-up vegetables. ?  cup of vegetable juice.  Fill one fourth of your plate with whole grains. Look for the word "whole" as the first word in the ingredient list.  Fill one fourth of your plate with lean protein foods.  Eat 4-5 servings of fruit per day. A serving of fruit is: ? One medium whole fruit. ?  cup of dried fruit. ?  cup of fresh, frozen, or canned fruit. ?  cup of 100% fruit juice.  Eat more foods that contain soluble fiber. These include apples, broccoli, carrots, beans, peas, and barley. Try to get 20-30 g of fiber per day.  Eat more home-cooked food. Eat less restaurant, buffet, and fast food.  Limit or avoid alcohol.  Limit foods high in starch and sugar.  Avoid fried foods.  Avoid frying your food. Try baking, boiling, grilling, or broiling it instead. You can also reduce fat by: ? Removing the skin from poultry. ? Removing all visible fats from meats. ? Skimming the fat off of stews, soups, and gravies before serving them. ? Steaming vegetables in water or broth.  Lose weight if you are overweight.  Eat 4-5 servings of nuts, legumes, and seeds per week: ? One serving of dried beans or legumes equals  cup after being cooked. ? One serving of nuts equals 1 ounces. ? One serving of seeds equals  ounce or one tablespoon.  You may need to keep track of how much salt or sodium you  eat. This is especially true if you have high blood pressure. Talk with your doctor or dietitian to get more information. What foods can I eat? Grains Breads, including [place], white, pita, wheat, raisin, rye, oatmeal, and [place]. Tortillas that are neither fried nor made with lard or trans fat. Low-fat rolls, including hotdog and hamburger buns and English muffins. Biscuits. Muffins. Waffles. Pancakes. Light popcorn. Whole-grain  cereals. Flatbread. Melba toast. Pretzels. Breadsticks. Rusks. Low-fat snacks. Low-fat crackers, including oyster, saltine, matzo, [place], animal, and rye. Rice and pasta, including brown rice and pastas that are made with whole wheat. Vegetables All vegetables. Fruits All fruits, but limit coconut. Meats and Other Protein Sources Lean, well-trimmed beef, veal, pork, and lamb. Chicken and [place] without skin. All fish and shellfish. Wild duck, rabbit, pheasant, and venison. Egg whites or low-cholesterol egg substitutes. Dried beans, peas, lentils, and tofu. Seeds and most nuts. Dairy Low-fat or nonfat cheeses, including ricotta, string, and mozzarella. Skim or 1% milk that is liquid, powdered, or evaporated. Buttermilk that is made with low-fat milk. Nonfat or low-fat yogurt. Beverages Mineral water. Diet carbonated beverages. Sweets and Desserts Sherbets and fruit ices. Honey, jam, marmalade, jelly, and syrups. Meringues and gelatins. Pure sugar candy, such as hard candy, jelly beans, gumdrops, mints, marshmallows, and small amounts of dark chocolate. [employer] Eat all sweets and desserts in moderation. Fats and Oils Nonhydrogenated (trans-free) margarines. Vegetable oils, including soybean, sesame, sunflower, olive, peanut, safflower, corn, canola, and cottonseed. Salad dressings or mayonnaise made with a vegetable oil. Limit added fats and oils that you use for cooking, baking, salads, and as spreads. Other Cocoa powder. Coffee and tea. All seasonings and  condiments. The items listed above may not be a complete list of recommended foods or beverages. Contact your dietitian for more options. What foods are not recommended? Grains Breads that are made with saturated or trans fats, oils, or whole milk. Croissants. Butter rolls. Cheese breads. Sweet rolls. Donuts. Buttered popcorn. Chow mein noodles. High-fat crackers, such as cheese or butter crackers. Meats and Other Protein Sources Fatty meats, such as hotdogs, short ribs, sausage, spareribs, bacon, rib eye roast or steak, and mutton. High-fat deli meats, such as salami and bologna. Caviar. Domestic duck and goose. Organ meats, such as kidney, liver, sweetbreads, and heart. Dairy Cream, sour cream, cream cheese, and creamed cottage cheese. Whole-milk cheeses, including blue (bleu), Monterey Jack, [place], [place], American, [place], Swiss, cheddar, [place], and [place]. Whole or 2% milk that is liquid, evaporated, or condensed. Whole buttermilk. Cream sauce or high-fat cheese sauce. Yogurt that is made from whole milk. Beverages Regular sodas and juice drinks with added sugar. Sweets and Desserts Frosting. Pudding. Cookies. Cakes other than angel food cake. Candy that has milk chocolate or white chocolate, hydrogenated fat, butter, coconut, or unknown ingredients. Buttered syrups. Full-fat ice cream or ice cream drinks. Fats and Oils Gravy that has suet, meat fat, or shortening. Cocoa butter, hydrogenated oils, palm oil, coconut oil, palm kernel oil. These can often be found in baked products, candy, fried foods, nondairy creamers, and whipped toppings. Solid fats and shortenings, including bacon fat, salt pork, lard, and butter. Nondairy cream substitutes, such as coffee creamers and sour cream substitutes. Salad dressings that are made of unknown oils, cheese, or sour cream. The items listed above may not be a complete list of foods and beverages to avoid. Contact your dietitian for more  information. This information is not intended to replace advice given to you by your health care provider. Make sure you discuss any questions you have with your health care provider. Document Released: 02/27/2012 Document Revised: 02/03/2016 Document Reviewed: 02/19/2014 Elsevier Interactive Patient Education  [employer].

## 2018-02-21 NOTE — Progress Notes (Signed)
Progress Note  Patient Name: Brenda [name] Date of Encounter: 02/21/2018  Primary Cardiologist: Stanford Breed    Subjective   Breathing is OK  NO CP    Inpatient Medications    Scheduled Meds: . amLODipine  10 mg Oral Daily  . aspirin  81 mg Oral Daily  . cholecalciferol  2,000 Units Oral Daily  . clopidogrel  75 mg Oral Daily  . heparin  5,000 Units Subcutaneous Q8H  . lisinopril  20 mg Oral Daily   And  . hydrochlorothiazide  25 mg Oral Daily  . metoprolol succinate  100 mg Oral Daily  . rosuvastatin  40 mg Oral Daily  . sodium chloride flush  3 mL Intravenous Q12H  . spironolactone  50 mg Oral Daily  . venlafaxine XR  37.5 mg Oral Q breakfast   Continuous Infusions: . sodium chloride    . sodium chloride     PRN Meds: sodium chloride, acetaminophen, acetaminophen, hydrALAZINE, morphine injection, nitroGLYCERIN, ondansetron (ZOFRAN) IV, sodium chloride flush   Vital Signs    Vitals:   02/21/18 0816 02/21/18 0820 02/21/18 0841 02/21/18 1013  BP: (!) 165/87 (!) 167/85 (!) 158/62 (!) 113/51  Pulse: 60 (!) 59 (!) 56   Resp: 15 (!) 39 16   Temp:      TempSrc:      SpO2: 100% 100% 100%   Weight:      Height:        Intake/Output Summary (Last 24 hours) at 02/21/2018 1227 Last data filed at 02/21/2018 0556 Gross per 24 hour  Intake 1164.25 ml  Output -  Net 1164.25 ml   Filed Weights   02/20/18 1205 02/21/18 0422  Weight: 71.7 kg (158 lb 1.6 oz) 68.6 kg (151 lb 3.2 oz)    Telemetry    SR - Personally Reviewed  ECG      Physical Exam   GEN: No acute distress.   Neck: No JVD Cardiac: RRR, no murmurs, rubs, or gallops.  Respiratory: Clear to auscultation bilaterally. GI: Soft, nontender, non-distended  MS: No edema; No deformity.  R groin without hematoma   Neuro:  Nonfocal  Psych: Normal affect   Labs    Chemistry Recent Labs  Lab 02/19/18 1220 02/20/18 1213 02/21/18 0228  NA 139  --  137  K 3.3*  --  3.7  CL 102  --  102  CO2 28  --  25    GLUCOSE 92  --  100*  BUN 16  --  16  CREATININE 0.83 0.90 0.99  CALCIUM 9.5  --  9.3  GFRNONAA >60 >60 59*  GFRAA >60 >60 >60  ANIONGAP 9  --  10     Hematology Recent Labs  Lab 02/19/18 1220 02/20/18 1213 02/21/18 0228  WBC 5.8 6.1 5.7  RBC 4.46 4.70 4.45  HGB 13.9 14.2 13.7  HCT 41.4 43.6 41.8  MCV 92.8 92.8 93.9  MCH 31.2 30.2 30.8  MCHC 33.6 32.6 32.8  RDW 12.8 12.5 12.5  PLT 203 216 210    Cardiac Enzymes Recent Labs  Lab 02/20/18 1213 02/20/18 1821 02/21/18 0228  TROPONINI 0.08* 0.08* 0.06*    Recent Labs  Lab 02/19/18 1204 02/19/18 1857 02/20/18 0852  TROPIPOC 0.15* 0.12* 0.06     BNPNo results for input(s): BNP, PROBNP in the last 168 hours.   DDimer No results for input(s): DDIMER in the last 168 hours.   Radiology    No results found.  Cardiac Studies  Echo ------------------------------------------------------------------- History:   PMH:  chest pain  Coronary artery disease.  Congestive heart failure.  Stroke.  Risk factors:  Hypertension.  ------------------------------------------------------------------- Study Conclusions  - Left ventricle: The cavity size was normal. There was mild   concentric hypertrophy. Systolic function was normal. The   estimated ejection fraction was in the range of 60% to 65%. Wall   motion was normal; there were no regional wall motion   abnormalities. Features are consistent with a pseudonormal left   ventricular filling pattern, with concomitant abnormal relaxation   and increased filling pressure (grade 2 diastolic dysfunction). - Mitral valve: There was mild regurgitation directed centrally. - Left atrium: The atrium was mildly dilated.  ------------------------------------------------------------------- Study data:  No prior study was available for comparison.  Study status:  Routine.  Procedure:  Transthoracic echocardiography. Image quality was adequate.  Study completion:  There were  no complications.          Transthoracic echocardiography.  M-mode, complete 2D, spectral Doppler, and color Doppler.  Birthdate: Patient birthdate: July 04, 1956.  Age:  Patient is 62 yr old.  Sex: Gender: female.    BMI: 26.3 kg/m^2.  Blood pressure:     154/72 Patient status:  Inpatient.  Study date:  Study date: 02/20/2018. Study time: 01:28 PM.  Location:  Bedside.  -------------------------------------------------------------------  ------------------------------------------------------------------- Left ventricle:  The cavity size was normal. There was mild concentric hypertrophy. Systolic function was normal. The estimated ejection fraction was in the range of 60% to 65%. Wall motion was normal; there were no regional wall motion abnormalities. Features are consistent with a pseudonormal left ventricular filling pattern, with concomitant abnormal relaxation and increased filling pressure (grade 2 diastolic dysfunction).  ------------------------------------------------------------------- Aortic valve:   Trileaflet; mildly thickened leaflets. Mobility was not restricted. Sclerosis without stenosis.  Doppler: Transvalvular velocity was within the normal range. There was no stenosis. There was no regurgitation.  ------------------------------------------------------------------- Aorta:  Aortic root: The aortic root was normal in size.  ------------------------------------------------------------------- Mitral valve:   Structurally normal valve.   Mobility was not restricted.  Doppler:  Transvalvular velocity was within the normal range. There was no evidence for stenosis. There was mild regurgitation directed centrally.    Valve area by pressure half-time: 3.55 cm^2. Indexed valve area by pressure half-time: 1.94 cm^2/m^2.    Peak gradient (D): 3 mm Hg.  ------------------------------------------------------------------- Left atrium:  The atrium was mildly  dilated.  ------------------------------------------------------------------- Right ventricle:  The cavity size was normal. Wall thickness was normal. Systolic function was normal.  ------------------------------------------------------------------- Pulmonic valve:    Structurally normal valve.   Cusp separation was normal.  Doppler:  Transvalvular velocity was within the normal range. There was no evidence for stenosis. There was no regurgitation.  ------------------------------------------------------------------- Tricuspid valve:   Structurally normal valve.    Doppler: Transvalvular velocity was within the normal range. There was trivial regurgitation.  ------------------------------------------------------------------- Pulmonary artery:   The main pulmonary artery was normal-sized. Systolic pressure was within the normal range.  ------------------------------------------------------------------- Right atrium:  The atrium was normal in size.  ------------------------------------------------------------------- Pericardium:  There was no pericardial effusion.  ------------------------------------------------------------------- Systemic veins: Inferior vena cava: The vessel was normal in size.  ------------------------------------------------------------------- Measurements   Left ventricle                          Value          Reference  LV ID, ED, PLAX chordal         (  L)     36    mm       43 - 52  LV ID, ES, PLAX chordal         (L)     19    mm       23 - 38  LV fx shortening, PLAX chordal          47    %        >=29  LV PW thickness, ED                     13    mm       ----------  IVS/LV PW ratio, ED                     1              <=1.3  Stroke volume, 2D                       66    ml       ----------  Stroke volume/bsa, 2D                   36    ml/m^2   ----------  LV e&', lateral                          7.79  cm/s     ----------  LV E/e&',  lateral                        11.51          ----------  LV e&', medial                           6.74  cm/s     ----------  LV E/e&', medial                         13.31          ----------  LV e&', average                          7.27  cm/s     ----------  LV E/e&', average                        12.35          ----------    Ventricular septum                      Value          Reference  IVS thickness, ED                       13    mm       ----------    LVOT                                    Value          Reference  LVOT ID, S  18    mm       ----------  LVOT area                               2.54  cm^2     ----------  LVOT peak velocity, S                   104   cm/s     ----------  LVOT mean velocity, S                   76.8  cm/s     ----------  LVOT VTI, S                             26    cm       ----------  LVOT peak gradient, S                   4     mm Hg    ----------    Aorta                                   Value          Reference  Aortic root ID, ED                      25    mm       ----------    Left atrium                             Value          Reference  LA ID, A-P, ES                          37.94 mm       ----------  LA ID/bsa, A-P                          2.08  cm/m^2   <=2.2  LA volume, S                            61    ml       ----------  LA volume/bsa, S                        33.4  ml/m^2   ----------  LA volume, ES, 1-p A4C                  55.1  ml       ----------  LA volume/bsa, ES, 1-p A4C              30.1  ml/m^2   ----------  LA volume, ES, 1-p A2C                  64.4  ml       ----------  LA volume/bsa, ES, 1-p A2C              35.2  ml/m^2   ----------    Mitral valve  Value          Reference  Mitral E-wave peak velocity             89.7  cm/s     ----------  Mitral deceleration time                211   ms       150 - 230  Mitral pressure half-time               62     ms       ----------  Mitral peak gradient, D                 3     mm Hg    ----------  Mitral valve area, PHT, DP              3.55  cm^2     ----------  Mitral valve area/bsa, PHT, DP          1.94  cm^2/m^2 ----------    Pulmonary arteries                      Value          Reference  PA pressure, S, DP                      16    mm Hg    <=30    Tricuspid valve                         Value          Reference  Tricuspid regurg peak velocity          181   cm/s     ----------  Tricuspid peak RV-RA gradient           13    mm Hg    ----------    Right atrium                            Value          Reference  RA ID, S-I, ES, A4C             (H)     55.3  mm       34 - 49  RA area, ES, A4C                        13.2  cm^2     8.3 - 19.5  RA volume, ES, A/L                      26.9  ml       ----------  RA volume/bsa, ES, A/L                  14.7  ml/m^2   ----------    Systemic veins                          Value          Reference  Estimated CVP                           3     mm Hg    ----------  Right ventricle                         Value          Reference  RV ID, minor axis, ED, A4C base         29    mm       ----------  TAPSE                                   13    mm       ----------  RV pressure, S, DP                      16    mm Hg    <=30  RV s&', lateral, S                       8.05  cm/s     ----------  Legend: (L)  and  (H)  mark values outside specified reference range.  ------------------------------------------------------------------- Prepared and Electronically Authenticated by  Sanda Klein, MD 2019-06-12T15:48:06   CATH: Conclusion     The left ventricular systolic function is normal.  LV end diastolic pressure is normal.  The left ventricular ejection fraction is 55-65% by visual estimate.  Ost LM to Mid LM lesion is 80% stenosed.  Dist RCA lesion is 100% stenosed.  Prox RCA lesion is 30% stenosed.  Ost LAD to Prox LAD  lesion is 50% stenosed.  Prox LAD to Mid LAD lesion is 50% stenosed.  Ost Ramus to Ramus lesion is 95% stenosed.  Ost 1st Mrg lesion is 100% stenosed.  Origin to Prox Graft lesion before Ramus is 100% stenosed.    IMPRESSION: Ms. Heaslip is approximately 18 years post coronary artery bypass grafting.  She was admitted with unstable angina and had low and flat troponins.  Her anatomy is remarkable for an occluded sequential vein graft to the ramus and obtuse marginal branch.  The occlusion was at the aorta.  She did this does have high-grade distal left main disease.  LIMA is intact as is her vein graft to the distal right supplying the PDA and PLA branches.  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  At this point, I recommend optimal antianginal medical therapy.  I was hesitant to open up an 64 year old vein graft for fear of distal embolization.  Also of note was that there was damping of the pigtail catheter in the LV suggesting clot.  This was pulled back and evacuated.  In addition, there was occlusion of the femoral sheath suggesting clot as well and this was removed in the Cath Lab and pressure held.  Quay Burow. MD, [facility] 02/21/2018 8:20 AM    Patient Profile     62 y.o. female Hx of CAD with remote CABG, PVOD, HL   Admitted  With chest pressure and dyspnea    Assessment & Plan    1  CAD   Remote CABG   Trivial increase in troponin.   Withhistory underwent cath today   Results noted above  See impression by [place] to follow BP and maximize medical Rx   Will add low dose imdur  to regimen     Echo showed LVEF normal   Thre was Gr II diastolic dysfunction with reported increased filling pressures   LVEDP at cath not reliable due to damping  Overall volume appears ok   Will aim for BP control    HR is on lower side  Do not think can push  much     She will need to be followed for changes as outpt  2   HL LDL 69   HDL 40   Would probably benefit from a lipomed panel as outpt to confirm particle numbers     3  HTN   Follow closely as outpt  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      Signed, Dorris Carnes, MD  02/21/2018, 12:27 PM

## 2018-02-21 NOTE — Discharge Summary (Signed)
Discharge Summary    Patient ID: Brenda [name],  MRN: [id number], DOB/AGE: [birth year] 62 y.o.  Admit date: 02/19/2018 Discharge date: 02/21/2018  Primary Care Provider: Pricilla Holm A Primary Cardiologist: Kirk Ruths, MD   Discharge Diagnoses    Active Problems:   Hypertensive emergency   Chest pain   Elevated troponin   Allergies Allergies  Allergen Reactions  . Latex     unknown    Diagnostic Studies/Procedures    Echo 02/20/18 Study Conclusions - Left ventricle: The cavity size was normal. There was mild concentric hypertrophy. Systolic function was normal. The estimated ejection fraction was in the range of 60% to 65%. Wall motion was normal; there were no regional wall motion abnormalities. Features are consistent with a pseudonormal left ventricular filling pattern, with concomitant abnormal relaxation and increased filling pressure (grade 2 diastolic dysfunction). - Mitral valve: There was mild regurgitation directed centrally. - Left atrium: The atrium was mildly dilated.   Cardiac CATH 02/21/18: Conclusion    The left ventricular systolic function is normal.  LV end diastolic pressure is normal.  The left ventricular ejection fraction is 55-65% by visual estimate.  Ost LM to Mid LM lesion is 80% stenosed.  Dist RCA lesion is 100% stenosed.  Prox RCA lesion is 30% stenosed.  Ost LAD to Prox LAD lesion is 50% stenosed.  Prox LAD to Mid LAD lesion is 50% stenosed.  Ost Ramus to Ramus lesion is 95% stenosed.  Ost 1st Mrg lesion is 100% stenosed.  Origin to Prox Graft lesion before Ramus is 100% stenosed.   IMPRESSION:Brenda [name] is approximately 18 years post coronary artery bypass grafting. She was admitted with unstable angina and had low and flat troponins. Her anatomy is remarkable for an occluded sequential vein graft to the ramus and obtuse marginal branch. The occlusion was at the aorta. She did this does have high-grade  distal left main disease. LIMA is intact as is her vein graft to the distal right supplying the PDA and PLA branches. 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 At this point, I recommend optimal antianginal medical therapy. I was hesitant to open up an 62 year old vein graft for fear of distal embolization. Also of note was that there was damping of the pigtail catheter in the LV suggesting clot. This was pulled back and evacuated. In addition, there was occlusion of the femoral sheath suggesting clot as well and this was removed in the Cath Lab and pressure held.  Brenda [name]. MD, [facility] 02/21/2018 8:20 AM  _____________   History of Present Illness     Brenda [name] is a 62 year old female with a history of CAD s/p CABG (2001), HTN, HLD, history of CVA, PAD s/p bilateral common iliac stenting, carotid artery disease (followed by Dr. Donzetta Matters) and history of breast CA who presented to [facility] on 02/19/2018 with complaints of chest pain. She presented to [facility] emergency department on 02/19/2018 with complaints of an acute episode of anterior chest tightness around 8 AM on day of admission while walking which spontaneously resolved with rest. She stopped walking. She describes herself as an avid exerciser, walking or doing yoga everyday of the week. Up until yesterday, she has not experienced any chest tightness with her activities.  Last week she did note that she got SOB with walking up incline   She had not  had in past. She did state also  that she has had several weeks of bilateral arm aching/tingling and episodic indigestion, which both occur together. She denied radiation of her chest tightness and has had no recurrence since this episode. She denied  LE swelling, orthopnea, palpitations, dizziness or syncope.   Has also noted some increased belching recently.   No  signfiicant reflux   She denies tobacco or alcohol use She reports a brief 2 to 3-week.  Where she ran out of her antihypertensive medications however, otherwise is compliant with education, diet and exercise recommendations.   Of note, she was last seen by Dr. Stanford Breed in January 2018.  Per chart review, echocardiogram in October 2010 showed normal LV function and mild mitral regurgitation. Renal Dopplers in January 2012 showed normal renal arteries and no abdominal aortic aneurysm. Carotid Dopplers in November 2016 showed 40 to 59% right and 1 to 30% left stenosis. She had a nuclear study in November 2016 which showed a small apical defect felt secondary to soft tissue attenuation but mild ischemia could not be excluded.She is also followed by Dr. Donzetta Matters for her vascular disease.  Last seen 06/08/2017 with plans to proceed with aortogram and bilateral iliac artery stenting which was completed 06/13/2017.   Hospital Course     Consultants: none  Troponins were mildly elevated at 0.12, 0.08, flat pattern. With her CAD history and remote CABG she underwent cardiac catheterization today. See detailed impression above report with impression by Dr. Gwenlyn Found. Low dose Imdur added at discharge. Echo showed normal LV EF, grade II diastolic dysfunction with reported increased filling pressures. LVEDP at cath not reliable due to damping. Will work on BP control moving forward. Heart rate in on the lower side and unable to titrate BB.   Hyperlipidemia:  -LDL 69,  HDL 40   Would probably benefit from a lipomed panel as outpt to confirm particle numbers     PVD: -Stable, followed closely with [facility] Donzetta Matters) -Recent renal artery stent placement 06/2017  Carotid artery disease: -Stable, followed closely with [facility]  Patient has been seen by Dr. Harrington Challenger today and deemed ready for discharge home. All follow up appointments have been scheduled. Discharge medications are listed below. _____________  Discharge Vitals Blood  pressure 127/68, pulse (!) 59, temperature 98.5 F (36.9 C), temperature source Oral, resp. rate 16, height 5\' 5"  (1.651 m), weight 151 lb 3.2 oz (68.6 kg), SpO2 100 %.  Filed Weights   02/20/18 1205 02/21/18 0422  Weight: 158 lb 1.6 oz (71.7 kg) 151 lb 3.2 oz (68.6 kg)    Labs & Radiologic Studies    CBC Recent Labs    02/20/18 1213 02/21/18 0228  WBC 6.1 5.7  NEUTROABS 3.6  --   HGB 14.2 13.7  HCT 43.6 41.8  MCV 92.8 93.9  PLT 216 161   Basic Metabolic Panel Recent Labs    02/19/18 1220 02/20/18 1213 02/21/18 0228  NA 139  --  137  K 3.3*  --  3.7  CL 102  --  102  CO2 28  --  25  GLUCOSE 92  --  100*  BUN 16  --  16  CREATININE 0.83 0.90 0.99  CALCIUM 9.5  --  9.3   Liver Function Tests No results for input(s): AST, ALT, ALKPHOS, BILITOT, PROT, ALBUMIN in the last 72 hours. No results for input(s): LIPASE, AMYLASE in the last 72 hours. Cardiac Enzymes Troponin I  Date Value Ref Range Status  02/21/2018  0.06 (HH) <0.03 ng/mL Final    Comment:    CRITICAL VALUE NOTED.  VALUE IS CONSISTENT WITH PREVIOUSLY REPORTED AND CALLED VALUE. Performed at [facility], [place] [address], [place], [place] [address]   02/20/2018 0.08 (HH) <0.03 ng/mL Final    Comment:    CRITICAL VALUE NOTED.  VALUE IS CONSISTENT WITH PREVIOUSLY REPORTED AND CALLED VALUE. Performed at [facility], [place] [address] [place], [place] [address]   02/20/2018 0.08 (HH) <0.03 ng/mL Final    Comment:    CRITICAL RESULT CALLED TO, READ BACK BY AND VERIFIED WITH: S.BARNHILL,RN 1324 02/20/18 CLARK,S Performed at [facility], [place] [address], [place], [place] [address]     Recent Labs    02/20/18 1213 02/20/18 1821 02/21/18 0228  TROPONINI 0.08* 0.08* 0.06*   BNP BNP No results found for: BNP  ProBNP No results found for: PROBNP   Invalid input(s): POCBNP D-Dimer No results for input(s): DDIMER in the last 72 hours. Hemoglobin A1C Lab Results  Component Value Date     HGBA1C 5.7 05/21/2015    No results for input(s): HGBA1C in the last 72 hours. Fasting Lipid Panel Cholesterol  Date Value Ref Range Status  02/21/2018 122 0 - 200 mg/dL Final   HDL  Date Value Ref Range Status  02/21/2018 40 (L) >40 mg/dL Final   LDL Cholesterol  Date Value Ref Range Status  02/21/2018 69 0 - 99 mg/dL Final    Comment:           Total Cholesterol/HDL:CHD Risk Coronary Heart Disease Risk Table                     Men   Women  1/2 Average Risk   3.4   3.3  Average Risk       5.0   4.4  2 X Average Risk   9.6   7.1  3 X Average Risk  23.4   11.0        Use the calculated Patient Ratio above and the CHD Risk Table to determine the patient's CHD Risk.        ATP III CLASSIFICATION (LDL):  <100     mg/dL   Optimal  100-129  mg/dL   Near or Above                    Optimal  130-159  mg/dL   Borderline  160-189  mg/dL   High  >190     mg/dL   Very High Performed at [facility] [address], [place], [place] [address]    Triglycerides  Date Value Ref Range Status  02/21/2018 63 <150 mg/dL Final   Direct LDL  Date Value Ref Range Status  04/03/2013 151.5 mg/dL Final    Comment:    Optimal:  <100 mg/dLNear or Above Optimal:  100-129 mg/dLBorderline High:  130-159 mg/dLHigh:  160-189 mg/dLVery High:  >190 mg/dL    Recent Labs    02/21/18 0228  CHOL 122  HDL 40*  LDLCALC 69  TRIG 63  CHOLHDL 3.1   Thyroid Function Tests TSH  Date Value Ref Range Status  02/20/2018 1.465 0.350 - 4.500 uIU/mL Final    Comment:    Performed by a 3rd Generation assay with a functional sensitivity of <=0.01 uIU/mL. Performed at [facility], [place] [address], [place], [place] [address]   05/21/2015 1.44 0.35 - 4.50 uIU/mL  Final    Recent Labs    02/20/18 1213  TSH 1.465   _____________  Dg Chest 2 View  Result Date: 02/19/2018 CLINICAL DATA:  Chest tightness and shortness of breath EXAM: CHEST - 2 VIEW COMPARISON:  November 28, 2003  FINDINGS: Lungs are clear. Heart is upper normal in size with pulmonary vascularity normal. No adenopathy. There is aortic atherosclerosis. Patient is status post coronary artery bypass grafting. There are surgical clips over the left axillary region. IMPRESSION: No edema or consolidation. Areas of postoperative change. There is aortic atherosclerosis. Aortic Atherosclerosis (ICD10-I70.0). Electronically Signed   By: Lowella Grip III M.D.   On: 02/19/2018 09:24   Disposition   Pt is being discharged home today in good condition.  Follow-up Plans & Appointments    Follow-up Information    Barrett, Evelene Croon, PA-C Follow up.   Specialties:  Cardiology, Radiology Why:  Cardiology hospital follow up on 03/13/18 at 1:30. please arrive 15 minutes early for check in.  Contact information: [address] [place] [address] [phone]          Discharge Instructions    Diet - low sodium heart healthy   Complete by:  As directed    Increase activity slowly   Complete by:  As directed       Discharge Medications   Allergies as of 02/21/2018      Reactions   Latex    unknown      Medication List    TAKE these medications   amLODipine 10 MG tablet Commonly known as:  NORVASC Take 10 mg by mouth daily.   aspirin 81 MG tablet Take 81 mg by mouth daily.   clopidogrel 75 MG tablet Commonly known as:  PLAVIX Take 1 tablet (75 mg total) by mouth daily.   isosorbide mononitrate 30 MG 24 hr tablet Commonly known as:  IMDUR Take 0.5 tablets (15 mg total) by mouth daily.   lisinopril-hydrochlorothiazide 20-25 MG tablet Commonly known as:  PRINZIDE,ZESTORETIC Take 2 tablets by mouth daily.   metoprolol succinate 100 MG 24 hr tablet Commonly known as:  TOPROL-XL TAKE 1 TABLET BY MOUTH ONCE DAILY WITH  OR  IMMEDIATELY  FOLLOWING  A  MEAL   multivitamin tablet Take 1 tablet by mouth daily.   nitroGLYCERIN 0.4 MG SL tablet Commonly known as:  NITROSTAT Place  1 tablet (0.4 mg total) under the tongue every 5 (five) minutes as needed for chest pain (x 3 tabs daily).   OVER THE COUNTER MEDICATION Take 1 tablet by mouth daily as needed (back pain). Back Pain OTC medication   rosuvastatin 40 MG tablet Commonly known as:  CRESTOR Take 1 tablet (40 mg total) by mouth daily.   spironolactone 25 MG tablet Commonly known as:  ALDACTONE TAKE 2 TABLETS BY MOUTH ONCE DAILY   venlafaxine XR 37.5 MG 24 hr capsule Commonly known as:  EFFEXOR XR Take 1 capsule (37.5 mg total) by mouth daily with breakfast.   Vitamin D 1000 units capsule Take 2,000 Units by mouth daily.        Outstanding Labs/Studies   none  Duration of Discharge Encounter   Greater than 30 minutes including physician time.  Signed, Daune Perch NP 02/21/2018, 5:40 PM

## 2018-02-22 MED FILL — Heparin Sod (Porcine)-NaCl IV Soln 1000 Unit/500ML-0.9%: INTRAVENOUS | Qty: 1000 | Status: AC

## 2018-02-24 ENCOUNTER — Other Ambulatory Visit: Payer: Self-pay | Admitting: Vascular Surgery

## 2018-02-25 ENCOUNTER — Other Ambulatory Visit: Payer: Self-pay | Admitting: *Deleted

## 2018-02-25 ENCOUNTER — Other Ambulatory Visit: Payer: Self-pay | Admitting: Cardiology

## 2018-02-25 DIAGNOSIS — R0602 Shortness of breath: Secondary | ICD-10-CM

## 2018-02-25 DIAGNOSIS — I2510 Atherosclerotic heart disease of native coronary artery without angina pectoris: Secondary | ICD-10-CM

## 2018-02-25 DIAGNOSIS — I10 Essential (primary) hypertension: Secondary | ICD-10-CM

## 2018-02-25 DIAGNOSIS — E7800 Pure hypercholesterolemia, unspecified: Secondary | ICD-10-CM

## 2018-02-25 MED ORDER — AMLODIPINE BESYLATE 10 MG PO TABS: 10.0000 mg | ORAL_TABLET | Freq: Every day | ORAL | 0 refills | Status: DC

## 2018-02-25 MED ORDER — NITROGLYCERIN 0.4 MG SL SUBL: 0.4000 mg | SUBLINGUAL_TABLET | SUBLINGUAL | 1 refills | Status: DC | PRN

## 2018-02-25 NOTE — Progress Notes (Signed)
Patient came in as a walk in requesting a refill on Amlodipine and Nitroglycerin. They have been sent in. The patient has a follow up on 03/13/18. She verbalized her understanding.

## 2018-02-28 ENCOUNTER — Other Ambulatory Visit: Payer: Self-pay | Admitting: Cardiology

## 2018-02-28 NOTE — Telephone Encounter (Signed)
New Message:        *STAT* If patient is at the pharmacy, call can be transferred to refill team.   1. Which medications need to be refilled? (please list name of each medication and dose if known) nitroGLYCERIN (NITROSTAT) 0.4 MG SL tablet  2. Which pharmacy/location (including street and city if local pharmacy) is medication to be sent to?[place] ([place]), [place] [address]  3. Do they need a 30 day or 90 day supply? 67    Pt is not sure if a new prescription needs to be sent in or just a refill.

## 2018-02-28 NOTE — Telephone Encounter (Signed)
nitroGLYCERIN (NITROSTAT) 0.4 MG SL tablet 25 tablet 1 02/25/2018    Sig - Route: Place 1 tablet (0.4 mg total) under the tongue every 5 (five) minutes as needed for chest pain (x 3 tabs daily). - Sublingual   Sent to pharmacy as: nitroGLYCERIN (NITROSTAT) 0.4 MG SL tablet   E-Prescribing Status: Receipt confirmed by pharmacy (02/25/2018 9:45 AM EDT)   Pharmacy   [place] ([place]), [place] [facility] [address] [place]   Patient notified via MyChart message

## 2018-03-01 ENCOUNTER — Encounter: Payer: Self-pay | Admitting: Cardiology

## 2018-03-01 ENCOUNTER — Emergency Department (HOSPITAL_COMMUNITY): Payer: Medicare Other

## 2018-03-01 ENCOUNTER — Encounter: Payer: Self-pay | Admitting: *Deleted

## 2018-03-01 ENCOUNTER — Observation Stay (HOSPITAL_COMMUNITY)
Admission: EM | Admit: 2018-03-01 | Discharge: 2018-03-02 | Disposition: A | Discharge status: Home / Self Care | Payer: Medicare Other | Attending: Family Medicine | Admitting: Family Medicine

## 2018-03-01 DIAGNOSIS — E039 Hypothyroidism, unspecified: Secondary | ICD-10-CM | POA: Diagnosis present

## 2018-03-01 DIAGNOSIS — I509 Heart failure, unspecified: Secondary | ICD-10-CM | POA: Insufficient documentation

## 2018-03-01 DIAGNOSIS — Z853 Personal history of malignant neoplasm of breast: Secondary | ICD-10-CM | POA: Insufficient documentation

## 2018-03-01 DIAGNOSIS — Z951 Presence of aortocoronary bypass graft: Secondary | ICD-10-CM | POA: Diagnosis not present

## 2018-03-01 DIAGNOSIS — Z7902 Long term (current) use of antithrombotics/antiplatelets: Secondary | ICD-10-CM | POA: Insufficient documentation

## 2018-03-01 DIAGNOSIS — E7800 Pure hypercholesterolemia, unspecified: Secondary | ICD-10-CM | POA: Insufficient documentation

## 2018-03-01 DIAGNOSIS — Z79899 Other long term (current) drug therapy: Secondary | ICD-10-CM | POA: Diagnosis not present

## 2018-03-01 DIAGNOSIS — I708 Atherosclerosis of other arteries: Secondary | ICD-10-CM | POA: Diagnosis not present

## 2018-03-01 DIAGNOSIS — R0789 Other chest pain: Principal | ICD-10-CM | POA: Insufficient documentation

## 2018-03-01 DIAGNOSIS — I10 Essential (primary) hypertension: Secondary | ICD-10-CM | POA: Diagnosis present

## 2018-03-01 DIAGNOSIS — M542 Cervicalgia: Secondary | ICD-10-CM | POA: Diagnosis not present

## 2018-03-01 DIAGNOSIS — I25110 Atherosclerotic heart disease of native coronary artery with unstable angina pectoris: Secondary | ICD-10-CM | POA: Diagnosis not present

## 2018-03-01 DIAGNOSIS — F329 Major depressive disorder, single episode, unspecified: Secondary | ICD-10-CM | POA: Insufficient documentation

## 2018-03-01 DIAGNOSIS — Z91040 Latex allergy status: Secondary | ICD-10-CM | POA: Insufficient documentation

## 2018-03-01 DIAGNOSIS — I739 Peripheral vascular disease, unspecified: Secondary | ICD-10-CM | POA: Diagnosis not present

## 2018-03-01 DIAGNOSIS — Z8673 Personal history of transient ischemic attack (TIA), and cerebral infarction without residual deficits: Secondary | ICD-10-CM | POA: Diagnosis not present

## 2018-03-01 DIAGNOSIS — Z7982 Long term (current) use of aspirin: Secondary | ICD-10-CM | POA: Insufficient documentation

## 2018-03-01 DIAGNOSIS — I110 Hypertensive heart disease with heart failure: Secondary | ICD-10-CM | POA: Insufficient documentation

## 2018-03-01 DIAGNOSIS — R001 Bradycardia, unspecified: Secondary | ICD-10-CM | POA: Diagnosis not present

## 2018-03-01 DIAGNOSIS — R079 Chest pain, unspecified: Secondary | ICD-10-CM | POA: Diagnosis present

## 2018-03-01 DIAGNOSIS — Z87891 Personal history of nicotine dependence: Secondary | ICD-10-CM | POA: Insufficient documentation

## 2018-03-01 LAB — CBC
HCT: 35.7 % — ABNORMAL LOW (ref 36.0–46.0)
Hemoglobin: 11.7 g/dL — ABNORMAL LOW (ref 12.0–15.0)
MCH: 30.6 pg (ref 26.0–34.0)
MCHC: 32.8 g/dL (ref 30.0–36.0)
MCV: 93.5 fL (ref 78.0–100.0)
Platelets: 177 10*3/uL (ref 150–400)
RBC: 3.82 MIL/uL — ABNORMAL LOW (ref 3.87–5.11)
RDW: 12.2 % (ref 11.5–15.5)
WBC: 6.5 10*3/uL (ref 4.0–10.5)

## 2018-03-01 LAB — I-STAT TROPONIN, ED: Troponin i, poc: 0.01 ng/mL (ref 0.00–0.08)

## 2018-03-01 NOTE — ED Triage Notes (Signed)
Pt comes via [facility] for CP, hx of stent placement, recent cath 10 days ago. Today she began to have chest tightness with radiation to neck and diaphoretic, denies n/v/SOB. PTA one nitro and 324 ASA

## 2018-03-01 NOTE — ED Provider Notes (Signed)
[place] EMERGENCY DEPARTMENT Provider Note   CSN: [id number] Arrival date & time: 03/01/18  2220     History   Chief Complaint Chief Complaint  Patient presents with  . Chest Pain    HPI Brenda [name] is a 62 y.o. female.  62 yo F with a cc of chest pain. Going on for past couple days.  Off and on for the past couple days.  Not exertional.  Seems to come and go.  SOB off and on as well, some diaphoresis, radiates to the right neck.  Feels different from prior MI.  Recently cathed about 10 days ago with disease, but not stented.    The history is provided by the patient and a relative.  Chest Pain   This is a recurrent problem. The current episode started 2 days ago. The problem occurs hourly. The problem has not changed since onset.The pain is present in the substernal region. The pain is at a severity of 4/10. The pain is mild. The quality of the pain is described as brief. The pain radiates to the right jaw. Duration of episode(s) is 2 days. Associated symptoms include diaphoresis and shortness of breath. Pertinent negatives include no dizziness, no fever, no headaches, no nausea, no palpitations and no vomiting. She has tried nothing for the symptoms. The treatment provided no relief.  Her past medical history is significant for CAD and MI.    Past Medical History:  Diagnosis Date  . Cancer [facility])    breast cancer  . Carotid artery stenosis   . CHF (congestive heart failure) ([place])   . Coronary artery disease   . Depression   . Hypercholesteremia   . Hypertension   . Hyperthyroidism   . Stroke [facility])     Patient Active Problem List   Diagnosis Date Noted  . Hypothyroidism 03/02/2018  . Elevated troponin   . Chest pain 02/20/2018  . Hypertensive emergency 02/19/2018  . Lesion of pancreas 06/22/2017  . Constipation 06/22/2017  . Peripheral vascular disease ([place]) 06/01/2017  . Left leg pain 05/10/2017  . Allergic rhinitis 02/25/2016  . Low back  pain 09/29/2015  . Carpal tunnel syndrome, right 08/20/2015  . Hx of completed stroke 05/21/2015  . Memory impairment 05/21/2015  . Hyperthyroidism 12/29/2008  . HYPERCHOLESTEROLEMIA 12/29/2008  . Anxiety 12/29/2008  . Essential hypertension 12/29/2008  . Coronary atherosclerosis 12/29/2008  . CAROTID ARTERY STENOSIS 12/29/2008    Past Surgical History:  Procedure Laterality Date  . ABDOMINAL AORTOGRAM W/LOWER EXTREMITY N/A 06/13/2017   Procedure: ABDOMINAL AORTOGRAM W/LOWER EXTREMITY;  Surgeon: Waynetta Sandy, MD;  Location: [place] CV LAB;  Service: Cardiovascular;  Laterality: N/A;  Bilateral  . ABDOMINAL HYSTERECTOMY    . BREAST LUMPECTOMY  1999  . BREAST LUMPECTOMY     left breast  . CARDIAC SURGERY    . CAROTID ENDARTERECTOMY  04-2000   left  . CORONARY ARTERY BYPASS GRAFT  2001   x 5 (left internal mammary artery to left anterior  descending coronary artery  . LEFT HEART CATH AND CORS/GRAFTS ANGIOGRAPHY N/A 02/21/2018   Procedure: LEFT HEART CATH AND CORS/GRAFTS ANGIOGRAPHY;  Surgeon: Lorretta Harp, MD;  Location: [place] CV LAB;  Service: Cardiovascular;  Laterality: N/A;  . PERIPHERAL VASCULAR INTERVENTION  06/13/2017   Procedure: PERIPHERAL VASCULAR INTERVENTION;  Surgeon: Waynetta Sandy, MD;  Location: [place] CV LAB;  Service: Cardiovascular;;  Bilateral Iliac Stents     OB History   None  Home Medications    Prior to Admission medications   Medication Sig Start Date End Date Taking? Authorizing Provider  amLODipine (NORVASC) 10 MG tablet Take 1 tablet (10 mg total) by mouth daily. 02/25/18  Yes Lelon Perla, MD  aspirin 81 MG tablet Take 81 mg by mouth daily.     Yes [provider]  Carboxymethylcellulose Sodium (REFRESH PLUS OP) Place 1-2 drops into both eyes as needed (for dryness or irritation only when wearing contacts).   Yes [provider]  Cholecalciferol (VITAMIN D-3) 1000 units CAPS Take  2,000 Units by mouth daily.   Yes [provider]  clopidogrel (PLAVIX) 75 MG tablet TAKE 1 TABLET BY MOUTH ONCE DAILY 02/24/18  Yes Waynetta Sandy, MD  isosorbide mononitrate (IMDUR) 30 MG 24 hr tablet Take 0.5 tablets (15 mg total) by mouth daily. 02/21/18 02/21/19 Yes Daune Perch, NP  lisinopril-hydrochlorothiazide (PRINZIDE,ZESTORETIC) 20-25 MG tablet Take 2 tablets by mouth daily. Patient taking differently: Take 1 tablet by mouth daily.  11/21/16  Yes Lelon Perla, MD  metoprolol succinate (TOPROL-XL) 100 MG 24 hr tablet TAKE 1 TABLET BY MOUTH ONCE DAILY WITH  OR  IMMEDIATELY  FOLLOWING  A  MEAL 02/18/18  Yes Crenshaw, Denice Bors, MD  Multiple Vitamins-Calcium (ONE-A-DAY WOMENS PO) Take 1 tablet by mouth daily.   Yes [provider]  OVER THE COUNTER MEDICATION Take 1 tablet by mouth See admin instructions. Unnamed Back Pain OTC medication: Take 1 tablet by mouth every six hours as needed for back pain   Yes [provider]  rosuvastatin (CRESTOR) 20 MG tablet Take 20 mg by mouth daily. 02/24/18  Yes [provider]  venlafaxine XR (EFFEXOR XR) 37.5 MG 24 hr capsule Take 1 capsule (37.5 mg total) by mouth daily with breakfast. 05/10/17  Yes Hulan Saas M, DO  amLODipine (NORVASC) 5 MG tablet TAKE ONE TABLET BY MOUTH ONCE DAILY Patient not taking: Reported on 03/01/2018 02/25/18   Lelon Perla, MD  nitroGLYCERIN (NITROSTAT) 0.4 MG SL tablet Place 1 tablet (0.4 mg total) under the tongue every 5 (five) minutes as needed for chest pain (x 3 tabs daily). 02/25/18   Lelon Perla, MD  rosuvastatin (CRESTOR) 40 MG tablet Take 1 tablet (40 mg total) by mouth daily. Patient not taking: Reported on 03/01/2018 06/18/14   Lelon Perla, MD  spironolactone (ALDACTONE) 25 MG tablet TAKE 2 TABLETS BY MOUTH ONCE DAILY 10/09/17   Lelon Perla, MD    Family History Family History  Problem Relation Age of Onset  . Lung cancer Mother        died  at an early age  . Other Unknown        There is no Hx of premature coronary artery disease in her family    Social History Social History   Tobacco Use  . Smoking status: Former Smoker    Types: Cigarettes    Last attempt to quit: 09/11/2001    Years since quitting: 16.4  . Smokeless tobacco: Never Used  Substance Use Topics  . Alcohol use: No  . Drug use: No     Allergies   Latex   Review of Systems Review of Systems  Constitutional: Positive for diaphoresis. Negative for chills and fever.  HENT: Negative for congestion and rhinorrhea.   Eyes: Negative for redness and visual disturbance.  Respiratory: Positive for shortness of breath. Negative for wheezing.   Cardiovascular: Positive for chest pain. Negative for palpitations.  Gastrointestinal: Negative for nausea and vomiting.  Genitourinary: Negative for dysuria and urgency.  Musculoskeletal: Negative for arthralgias and myalgias.  Skin: Negative for pallor and wound.  Neurological: Negative for dizziness and headaches.     Physical Exam Updated Vital Signs BP (!) 149/67 (BP Location: Right Arm)   Pulse (!) 59   Temp 98 F (36.7 C) (Oral)   Resp 17   Ht 5\' 5"  (1.651 m)   Wt 71.3 kg (157 lb 3.2 oz)   SpO2 99%   BMI 26.16 kg/m   Physical Exam  Constitutional: She is oriented to person, place, and time. She appears well-developed and well-nourished. No distress.  HENT:  Head: Normocephalic and atraumatic.  Eyes: Pupils are equal, round, and reactive to light. EOM are normal.  Neck: Normal range of motion. Neck supple.  Cardiovascular: Normal rate and regular rhythm. Exam reveals no gallop and no friction rub.  No murmur heard. Pulmonary/Chest: Effort normal. She has no wheezes. She has no rales. She exhibits no tenderness.  Abdominal: Soft. She exhibits no distension and no mass. There is no tenderness. There is no guarding.  Musculoskeletal: She exhibits no edema or tenderness.  Neurological: She is  alert and oriented to person, place, and time.  Skin: Skin is warm and dry. She is not diaphoretic.  Psychiatric: She has a normal mood and affect. Her behavior is normal.  Nursing note and vitals reviewed.    ED Treatments / Results  Labs (all labs ordered are listed, but only abnormal results are displayed) Labs Reviewed  BASIC METABOLIC PANEL - Abnormal; Notable for the following components:      Result Value   Potassium 3.3 (*)    BUN 22 (*)    Creatinine, Ser 1.06 (*)    GFR calc non Af Amer 54 (*)    All other components within normal limits  CBC - Abnormal; Notable for the following components:   RBC 3.82 (*)    Hemoglobin 11.7 (*)    HCT 35.7 (*)    All other components within normal limits  CBC  CREATININE, SERUM  TROPONIN I  TROPONIN I  TROPONIN I  I-STAT TROPONIN, ED    EKG EKG Interpretation  Date/Time:  Friday March 01 2018 22:32:24 EDT Ventricular Rate:  58 PR Interval:    QRS Duration: 77 QT Interval:  598 QTC Calculation: 588 R Axis:   50 Text Interpretation:  Sinus rhythm Left atrial enlargement LVH with secondary repolarization abnormality No significant change since last tracing Confirmed by Deno Etienne [phone]) on 03/01/2018 11:02:23 PM   Radiology Dg Chest 2 View  Result Date: 03/02/2018 CLINICAL DATA:  Generalized chest pain. Catheterization 10 days ago. Today chest tightness radiating to the neck. Diaphoresis. Quit smoking 2003. History of hypertension and CHF. EXAM: CHEST - 2 VIEW COMPARISON:  02/19/2018 FINDINGS: Postoperative changes in the mediastinum. Surgical clips in the left axilla. Mild cardiac enlargement. No vascular congestion, edema, or consolidation. No blunting of costophrenic angles. No pneumothorax. Mediastinal contours appear intact. Calcification of the aorta. IMPRESSION: Mild cardiac enlargement.  No evidence of active pulmonary disease. Electronically Signed   By: Lucienne Capers M.D.   On: 03/02/2018 00:08     Procedures Procedures (including critical care time)  Medications Ordered in ED Medications  hydrALAZINE (APRESOLINE) injection 10 mg (10 mg Intravenous Given 03/02/18 0201)  amLODipine (NORVASC) tablet 10 mg (has no administration in time range)  aspirin EC tablet 81 mg (has no administration in time range)  clopidogrel (PLAVIX) tablet 75 mg (has no administration in time range)  isosorbide mononitrate (IMDUR) 24 hr tablet 30 mg (has no administration in time range)  metoprolol succinate (TOPROL-XL) 24 hr tablet 100 mg (has no administration in time range)  nitroGLYCERIN (NITROSTAT) SL tablet 0.4 mg (has no administration in time range)  rosuvastatin (CRESTOR) tablet 20 mg (has no administration in time range)  spironolactone (ALDACTONE) tablet 50 mg (has no administration in time range)  venlafaxine XR (EFFEXOR-XR) 24 hr capsule 37.5 mg (has no administration in time range)  acetaminophen (TYLENOL) tablet 650 mg (has no administration in time range)  ondansetron (ZOFRAN) injection 4 mg (has no administration in time range)  enoxaparin (LOVENOX) injection 40 mg (has no administration in time range)  lisinopril (PRINIVIL,ZESTRIL) tablet 20 mg (has no administration in time range)  hydrochlorothiazide (HYDRODIURIL) tablet 25 mg (has no administration in time range)  zolpidem (AMBIEN) tablet 5 mg (5 mg Oral Given 03/02/18 0200)     Initial Impression / Assessment and Plan / ED Course  I have reviewed the triage vital signs and the nursing notes.  Pertinent labs & imaging results that were available during my care of the patient were reviewed by me and considered in my medical decision making (see chart for details).     62 yo F with a chief complaint of chest pain.  This is different from her prior MIs.  Describes it as a pressure that radiates up into the right neck has had some diaphoresis with it denies nausea or vomiting.  Shortness of breath.  Seems to come and go.  EKG is  unchanged from her prior.  Troponin is negative.  I feel that she is high risk with her recent cath showing disease will discuss with the cardiologist.  Cards recommends medical admission for rule out.  The patients results and plan were reviewed and discussed.   Any x-rays performed were independently reviewed by myself.   Differential diagnosis were considered with the presenting HPI.  Medications  hydrALAZINE (APRESOLINE) injection 10 mg (10 mg Intravenous Given 03/02/18 0201)  amLODipine (NORVASC) tablet 10 mg (has no administration in time range)  aspirin EC tablet 81 mg (has no administration in time range)  clopidogrel (PLAVIX) tablet 75 mg (has no administration in time range)  isosorbide mononitrate (IMDUR) 24 hr tablet 30 mg (has no administration in time range)  metoprolol succinate (TOPROL-XL) 24 hr tablet 100 mg (has no administration in time range)  nitroGLYCERIN (NITROSTAT) SL tablet 0.4 mg (has no administration in time range)  rosuvastatin (CRESTOR) tablet 20 mg (has no administration in time range)  spironolactone (ALDACTONE) tablet 50 mg (has no administration in time range)  venlafaxine XR (EFFEXOR-XR) 24 hr capsule 37.5 mg (has no administration in time range)  acetaminophen (TYLENOL) tablet 650 mg (has no administration in time range)  ondansetron (ZOFRAN) injection 4 mg (has no administration in time range)  enoxaparin (LOVENOX) injection 40 mg (has no administration in time range)  lisinopril (PRINIVIL,ZESTRIL) tablet 20 mg (has no administration in time range)  hydrochlorothiazide (HYDRODIURIL) tablet 25 mg (has no administration in time range)  zolpidem (AMBIEN) tablet 5 mg (5 mg Oral Given 03/02/18 0200)    Vitals:   03/02/18 0045 03/02/18 0100 03/02/18 0145 03/02/18 0217  BP: (!) 172/72 (!) 180/76  (!) 149/67  Pulse: (!) 59  (!) 59 (!) 59  Resp: 18 12 16 17   Temp:    98 F (36.7 C)  TempSrc:  Oral  SpO2: 98%  95% 99%  Weight:    71.3 kg (157 lb 3.2 oz)   Height:    5\' 5"  (1.651 m)    Final diagnoses:  Chest pain, cardiac    Admission/ observation were discussed with the admitting physician, patient and/or family and they are comfortable with the plan.    Final Clinical Impressions(s) / ED Diagnoses   Final diagnoses:  Chest pain, cardiac    ED Discharge Orders    None       Deno Etienne, DO 03/02/18 5009

## 2018-03-02 ENCOUNTER — Other Ambulatory Visit: Payer: Self-pay

## 2018-03-02 ENCOUNTER — Encounter (HOSPITAL_COMMUNITY): Payer: Self-pay | Admitting: Internal Medicine

## 2018-03-02 DIAGNOSIS — I259 Chronic ischemic heart disease, unspecified: Secondary | ICD-10-CM | POA: Diagnosis not present

## 2018-03-02 DIAGNOSIS — I25119 Atherosclerotic heart disease of native coronary artery with unspecified angina pectoris: Secondary | ICD-10-CM

## 2018-03-02 DIAGNOSIS — I209 Angina pectoris, unspecified: Secondary | ICD-10-CM

## 2018-03-02 DIAGNOSIS — I10 Essential (primary) hypertension: Secondary | ICD-10-CM

## 2018-03-02 DIAGNOSIS — R079 Chest pain, unspecified: Secondary | ICD-10-CM

## 2018-03-02 DIAGNOSIS — E039 Hypothyroidism, unspecified: Secondary | ICD-10-CM | POA: Diagnosis present

## 2018-03-02 LAB — CBC
HCT: 36.4 % (ref 36.0–46.0)
Hemoglobin: 12.1 g/dL (ref 12.0–15.0)
MCH: 30.6 pg (ref 26.0–34.0)
MCHC: 33.2 g/dL (ref 30.0–36.0)
MCV: 91.9 fL (ref 78.0–100.0)
Platelets: 183 10*3/uL (ref 150–400)
RBC: 3.96 MIL/uL (ref 3.87–5.11)
RDW: 12 % (ref 11.5–15.5)
WBC: 6 10*3/uL (ref 4.0–10.5)

## 2018-03-02 LAB — BASIC METABOLIC PANEL
Anion gap: 9 (ref 5–15)
BUN: 22 mg/dL — ABNORMAL HIGH (ref 6–20)
CO2: 25 mmol/L (ref 22–32)
Calcium: 9.2 mg/dL (ref 8.9–10.3)
Chloride: 104 mmol/L (ref 101–111)
Creatinine, Ser: 1.06 mg/dL — ABNORMAL HIGH (ref 0.44–1.00)
GFR calc Af Amer: >60 mL/min (ref >60)
GFR calc non Af Amer: 54 mL/min — ABNORMAL LOW (ref >60)
Glucose, Bld: 98 mg/dL (ref 65–99)
Potassium: 3.3 mmol/L — ABNORMAL LOW (ref 3.5–5.1)
Sodium: 138 mmol/L (ref 135–145)

## 2018-03-02 LAB — TROPONIN I
Troponin I: <0.03 ng/mL (ref <0.03)
Troponin I: <0.03 ng/mL (ref <0.03)

## 2018-03-02 LAB — CREATININE, SERUM
Creatinine, Ser: 0.86 mg/dL (ref 0.44–1.00)
GFR calc Af Amer: >60 mL/min (ref >60)
GFR calc non Af Amer: >60 mL/min (ref >60)

## 2018-03-02 MED ORDER — ISOSORBIDE MONONITRATE ER 30 MG PO TB24: 30.0000 mg | ORAL_TABLET | Freq: Every day | ORAL | 5 refills | Status: DC

## 2018-03-02 MED ORDER — SPIRONOLACTONE 25 MG PO TABS
50.0000 mg | ORAL_TABLET | Freq: Every day | ORAL | Status: DC
  Administered 2018-03-02: 50 mg via ORAL
  Filled 2018-03-02: qty 2

## 2018-03-02 MED ORDER — ONDANSETRON HCL 4 MG/2ML IJ SOLN: 4.0000 mg | Freq: Four times a day (QID) | INTRAMUSCULAR | Status: DC | PRN

## 2018-03-02 MED ORDER — ENOXAPARIN SODIUM 40 MG/0.4ML ~~LOC~~ SOLN
40.0000 mg | SUBCUTANEOUS | Status: DC
  Administered 2018-03-02: 40 mg via SUBCUTANEOUS
  Filled 2018-03-02: qty 0.4

## 2018-03-02 MED ORDER — LISINOPRIL-HYDROCHLOROTHIAZIDE 20-25 MG PO TABS: 1.0000 | ORAL_TABLET | Freq: Every day | ORAL | 1 refills | Status: DC

## 2018-03-02 MED ORDER — ZOLPIDEM TARTRATE 5 MG PO TABS
5.0000 mg | ORAL_TABLET | Freq: Once | ORAL | Status: AC
  Administered 2018-03-02: 5 mg via ORAL
  Filled 2018-03-02: qty 1

## 2018-03-02 MED ORDER — NITROGLYCERIN 0.4 MG SL SUBL: SUBLINGUAL_TABLET | SUBLINGUAL | 1 refills | Status: DC

## 2018-03-02 MED ORDER — HYDRALAZINE HCL 20 MG/ML IJ SOLN
10.0000 mg | INTRAMUSCULAR | Status: DC | PRN
  Administered 2018-03-02: 10 mg via INTRAVENOUS
  Filled 2018-03-02: qty 1

## 2018-03-02 MED ORDER — METOPROLOL SUCCINATE ER 100 MG PO TB24
100.0000 mg | ORAL_TABLET | Freq: Every day | ORAL | Status: DC
  Administered 2018-03-02: 100 mg via ORAL
  Filled 2018-03-02: qty 1

## 2018-03-02 MED ORDER — ASPIRIN 81 MG PO TABS: 81.0000 mg | ORAL_TABLET | Freq: Every day | ORAL | 2 refills | Status: DC

## 2018-03-02 MED ORDER — POTASSIUM CHLORIDE CRYS ER 20 MEQ PO TBCR
40.0000 meq | EXTENDED_RELEASE_TABLET | Freq: Once | ORAL | Status: AC
  Administered 2018-03-02: 40 meq via ORAL
  Filled 2018-03-02: qty 2

## 2018-03-02 MED ORDER — LISINOPRIL 10 MG PO TABS
20.0000 mg | ORAL_TABLET | Freq: Every day | ORAL | Status: DC
  Administered 2018-03-02: 20 mg via ORAL
  Filled 2018-03-02: qty 2

## 2018-03-02 MED ORDER — LISINOPRIL-HYDROCHLOROTHIAZIDE 20-25 MG PO TABS: 1.0000 | ORAL_TABLET | Freq: Every day | ORAL | Status: DC

## 2018-03-02 MED ORDER — HYDROCHLOROTHIAZIDE 25 MG PO TABS
25.0000 mg | ORAL_TABLET | Freq: Every day | ORAL | Status: DC
  Administered 2018-03-02: 25 mg via ORAL
  Filled 2018-03-02: qty 1

## 2018-03-02 MED ORDER — AMLODIPINE BESYLATE 10 MG PO TABS
10.0000 mg | ORAL_TABLET | Freq: Every day | ORAL | Status: DC
  Administered 2018-03-02: 10 mg via ORAL
  Filled 2018-03-02: qty 1

## 2018-03-02 MED ORDER — CLOPIDOGREL BISULFATE 75 MG PO TABS
75.0000 mg | ORAL_TABLET | Freq: Every day | ORAL | Status: DC
  Administered 2018-03-02: 75 mg via ORAL
  Filled 2018-03-02: qty 1

## 2018-03-02 MED ORDER — VENLAFAXINE HCL ER 37.5 MG PO CP24
37.5000 mg | ORAL_CAPSULE | Freq: Every day | ORAL | Status: DC
  Administered 2018-03-02: 37.5 mg via ORAL
  Filled 2018-03-02: qty 1

## 2018-03-02 MED ORDER — ROSUVASTATIN CALCIUM 10 MG PO TABS
20.0000 mg | ORAL_TABLET | Freq: Every day | ORAL | Status: DC
  Administered 2018-03-02: 20 mg via ORAL
  Filled 2018-03-02: qty 2

## 2018-03-02 MED ORDER — NITROGLYCERIN 0.4 MG SL SUBL: 0.4000 mg | SUBLINGUAL_TABLET | SUBLINGUAL | Status: DC | PRN

## 2018-03-02 MED ORDER — SPIRONOLACTONE 25 MG PO TABS: 25.0000 mg | ORAL_TABLET | Freq: Every day | ORAL | 3 refills | Status: DC

## 2018-03-02 MED ORDER — POTASSIUM CHLORIDE ER 20 MEQ PO TBCR: EXTENDED_RELEASE_TABLET | ORAL | 1 refills | Status: DC

## 2018-03-02 MED ORDER — ASPIRIN EC 81 MG PO TBEC
81.0000 mg | DELAYED_RELEASE_TABLET | Freq: Every day | ORAL | Status: DC
  Administered 2018-03-02: 81 mg via ORAL
  Filled 2018-03-02: qty 1

## 2018-03-02 MED ORDER — ISOSORBIDE MONONITRATE ER 30 MG PO TB24
30.0000 mg | ORAL_TABLET | Freq: Every day | ORAL | Status: DC
  Administered 2018-03-02: 30 mg via ORAL
  Filled 2018-03-02: qty 1

## 2018-03-02 MED ORDER — ACETAMINOPHEN 325 MG PO TABS: 650.0000 mg | ORAL_TABLET | ORAL | Status: DC | PRN

## 2018-03-02 NOTE — Plan of Care (Signed)
  Problem: Coping: Goal: Level of anxiety will decrease Outcome: Progressing   Problem: Nutrition: Goal: Adequate nutrition will be maintained Outcome: Progressing   Problem: Activity: Goal: Risk for activity intolerance will decrease Outcome: Progressing   Problem: Elimination: Goal: Will not experience complications related to bowel motility Outcome: Progressing   Problem: Safety: Goal: Ability to remain free from injury will improve Outcome: Progressing   Problem: Pain Managment: Goal: General experience of comfort will improve Outcome: Progressing

## 2018-03-02 NOTE — H&P (Signed)
History and Physical    Brenda [name] ZJQ:[id number] DOB: 1956-09-03 DOA: 03/01/2018  PCP: [name], MD  Patient coming from: Home.  Chief Complaint: Chest pain.  HPI: Brenda [name] is a 62 y.o. female with history of CAD status post CABG, hypertension, hyperlipidemia, history of stroke who was recently admitted for chest pain and had cardiac cath and at that time was decided to be managing conservatively last week, presents with chest pain.  Patient started having chest pain at home while doing her household chores.  Chest pain is retrosternal radiating to the neck.  Denies any associated shortness of breath.  Denies any productive cough fever or chills.  ED Course: In the ER patient's chest pain improved but had some neck pain.  Chest x-ray was unremarkable EKG shows sinus bradycardia with LVH.  Cardiologist on-call was consulted patient admitted for further observation and management.  Patient's blood pressure on arrival was markedly elevated.  Review of Systems: As per HPI, rest all negative.   Past Medical History:  Diagnosis Date  . Cancer [facility])    breast cancer  . Carotid artery stenosis   . CHF (congestive heart failure) ([place])   . Coronary artery disease   . Depression   . Hypercholesteremia   . Hypertension   . Hyperthyroidism   . Stroke [facility])     Past Surgical History:  Procedure Laterality Date  . ABDOMINAL AORTOGRAM W/LOWER EXTREMITY N/A 06/13/2017   Procedure: ABDOMINAL AORTOGRAM W/LOWER EXTREMITY;  Surgeon: Waynetta Sandy, MD;  Location: [place] CV LAB;  Service: Cardiovascular;  Laterality: N/A;  Bilateral  . ABDOMINAL HYSTERECTOMY    . BREAST LUMPECTOMY  1999  . BREAST LUMPECTOMY     left breast  . CARDIAC SURGERY    . CAROTID ENDARTERECTOMY  04-2000   left  . CORONARY ARTERY BYPASS GRAFT  2001   x 5 (left internal mammary artery to left anterior  descending coronary artery  . LEFT HEART CATH AND CORS/GRAFTS ANGIOGRAPHY N/A 02/21/2018    Procedure: LEFT HEART CATH AND CORS/GRAFTS ANGIOGRAPHY;  Surgeon: Lorretta Harp, MD;  Location: [place] CV LAB;  Service: Cardiovascular;  Laterality: N/A;  . PERIPHERAL VASCULAR INTERVENTION  06/13/2017   Procedure: PERIPHERAL VASCULAR INTERVENTION;  Surgeon: Waynetta Sandy, MD;  Location: [place] CV LAB;  Service: Cardiovascular;;  Bilateral Iliac Stents     reports that she quit smoking about 16 years ago. Her smoking use included cigarettes. She has never used smokeless tobacco. She reports that she does not drink alcohol or use drugs.  Allergies  Allergen Reactions  . Latex Other (See Comments)    Reaction not recalled    Family History  Problem Relation Age of Onset  . Lung cancer Mother        died at an early age  . Other Unknown        There is no Hx of premature coronary artery disease in her family    Prior to Admission medications   Medication Sig Start Date End Date Taking? Authorizing Provider  amLODipine (NORVASC) 10 MG tablet Take 1 tablet (10 mg total) by mouth daily. 02/25/18  Yes Lelon Perla, MD  aspirin 81 MG tablet Take 81 mg by mouth daily.     Yes [provider]  Carboxymethylcellulose Sodium (REFRESH PLUS OP) Place 1-2 drops into both eyes as needed (for dryness or irritation only when wearing contacts).   Yes [provider]  Cholecalciferol (VITAMIN D-3) 1000  units CAPS Take 2,000 Units by mouth daily.   Yes [provider]  clopidogrel (PLAVIX) 75 MG tablet TAKE 1 TABLET BY MOUTH ONCE DAILY 02/24/18  Yes Waynetta Sandy, MD  isosorbide mononitrate (IMDUR) 30 MG 24 hr tablet Take 0.5 tablets (15 mg total) by mouth daily. 02/21/18 02/21/19 Yes Daune Perch, NP  lisinopril-hydrochlorothiazide (PRINZIDE,ZESTORETIC) 20-25 MG tablet Take 2 tablets by mouth daily. Patient taking differently: Take 1 tablet by mouth daily.  11/21/16  Yes Lelon Perla, MD  metoprolol succinate (TOPROL-XL) 100 MG 24  hr tablet TAKE 1 TABLET BY MOUTH ONCE DAILY WITH  OR  IMMEDIATELY  FOLLOWING  A  MEAL 02/18/18  Yes Crenshaw, Denice Bors, MD  Multiple Vitamins-Calcium (ONE-A-DAY WOMENS PO) Take 1 tablet by mouth daily.   Yes [provider]  OVER THE COUNTER MEDICATION Take 1 tablet by mouth See admin instructions. Unnamed Back Pain OTC medication: Take 1 tablet by mouth every six hours as needed for back pain   Yes [provider]  rosuvastatin (CRESTOR) 20 MG tablet Take 20 mg by mouth daily. 02/24/18  Yes [provider]  venlafaxine XR (EFFEXOR XR) 37.5 MG 24 hr capsule Take 1 capsule (37.5 mg total) by mouth daily with breakfast. 05/10/17  Yes Hulan Saas M, DO  amLODipine (NORVASC) 5 MG tablet TAKE ONE TABLET BY MOUTH ONCE DAILY Patient not taking: Reported on 03/01/2018 02/25/18   Lelon Perla, MD  nitroGLYCERIN (NITROSTAT) 0.4 MG SL tablet Place 1 tablet (0.4 mg total) under the tongue every 5 (five) minutes as needed for chest pain (x 3 tabs daily). 02/25/18   Lelon Perla, MD  rosuvastatin (CRESTOR) 40 MG tablet Take 1 tablet (40 mg total) by mouth daily. Patient not taking: Reported on 03/01/2018 06/18/14   Lelon Perla, MD  spironolactone (ALDACTONE) 25 MG tablet TAKE 2 TABLETS BY MOUTH ONCE DAILY 10/09/17   Lelon Perla, MD    Physical Exam: Vitals:   03/02/18 0045 03/02/18 0100 03/02/18 0145 03/02/18 0217  BP: (!) 172/72 (!) 180/76  (!) 149/67  Pulse: (!) 59  (!) 59 (!) 59  Resp: 18 12 16 17   Temp:    98 F (36.7 C)  TempSrc:    Oral  SpO2: 98%  95% 99%  Weight:    71.3 kg (157 lb 3.2 oz)  Height:    5\' 5"  (1.651 m)      Constitutional: Moderately built and nourished. Vitals:   03/02/18 0045 03/02/18 0100 03/02/18 0145 03/02/18 0217  BP: (!) 172/72 (!) 180/76  (!) 149/67  Pulse: (!) 59  (!) 59 (!) 59  Resp: 18 12 16 17   Temp:    98 F (36.7 C)  TempSrc:    Oral  SpO2: 98%  95% 99%  Weight:    71.3 kg (157 lb 3.2 oz)  Height:    5\' 5"  (1.651  m)   Eyes: Anicteric no pallor. ENMT: No discharge from the ears eyes nose or mouth. Neck: No mass felt.  No JVD appreciated. Respiratory: No rhonchi or crepitations. Cardiovascular: S1-S2 heard. Abdomen: Soft nontender bowel sounds present. Musculoskeletal: No edema.  No joint effusion. Skin: No rash. Neurologic: Alert awake oriented to time place and person.  Moves all extremities. Psychiatric: Appears normal.   Labs on Admission: I have personally reviewed following labs and imaging studies  CBC: Recent Labs  Lab 03/01/18 2239  WBC 6.5  HGB 11.7*  HCT 35.7*  MCV 93.5  PLT 924   Basic Metabolic Panel: Recent Labs  Lab 03/01/18 2239  NA 138  K 3.3*  CL 104  CO2 25  GLUCOSE 98  BUN 22*  CREATININE 1.06*  CALCIUM 9.2   GFR: Estimated Creatinine Clearance: 53.1 mL/min (A) (by C-G formula based on SCr of 1.06 mg/dL (H)). Liver Function Tests: No results for input(s): AST, ALT, ALKPHOS, BILITOT, PROT, ALBUMIN in the last 168 hours. No results for input(s): LIPASE, AMYLASE in the last 168 hours. No results for input(s): AMMONIA in the last 168 hours. Coagulation Profile: No results for input(s): INR, PROTIME in the last 168 hours. Cardiac Enzymes: No results for input(s): CKTOTAL, CKMB, CKMBINDEX, TROPONINI in the last 168 hours. BNP (last 3 results) No results for input(s): PROBNP in the last 8760 hours. HbA1C: No results for input(s): HGBA1C in the last 72 hours. CBG: No results for input(s): GLUCAP in the last 168 hours. Lipid Profile: No results for input(s): CHOL, HDL, LDLCALC, TRIG, CHOLHDL, LDLDIRECT in the last 72 hours. Thyroid Function Tests: No results for input(s): TSH, T4TOTAL, FREET4, T3FREE, THYROIDAB in the last 72 hours. Anemia Panel: No results for input(s): VITAMINB12, FOLATE, FERRITIN, TIBC, IRON, RETICCTPCT in the last 72 hours. Urine analysis: No results found for: COLORURINE, APPEARANCEUR, LABSPEC, PHURINE, GLUCOSEU, HGBUR, BILIRUBINUR,  KETONESUR, PROTEINUR, UROBILINOGEN, NITRITE, LEUKOCYTESUR Sepsis Labs: @LABRCNTIP (procalcitonin:4,lacticidven:4) )No results found for this or any previous visit (from the past 240 hour(s)).   Radiological Exams on Admission: Dg Chest 2 View  Result Date: 03/02/2018 CLINICAL DATA:  Generalized chest pain. Catheterization 10 days ago. Today chest tightness radiating to the neck. Diaphoresis. Quit smoking 2003. History of hypertension and CHF. EXAM: CHEST - 2 VIEW COMPARISON:  02/19/2018 FINDINGS: Postoperative changes in the mediastinum. Surgical clips in the left axilla. Mild cardiac enlargement. No vascular congestion, edema, or consolidation. No blunting of costophrenic angles. No pneumothorax. Mediastinal contours appear intact. Calcification of the aorta. IMPRESSION: Mild cardiac enlargement.  No evidence of active pulmonary disease. Electronically Signed   By: Lucienne Capers M.D.   On: 03/02/2018 00:08    EKG: Independently reviewed.  Sinus bradycardia with LVH.  Assessment/Plan Principal Problem:   Chest pain Active Problems:   Essential hypertension   Hypothyroidism    1. Chest pain concerning for angina -we will cycle cardiac markers.  Continue antiplatelet agents statins beta-blockers.  Patient Imdur dose has been increased from 15-30 as recommended by cardiologist. 2. Hypertension uncontrolled likely contributing to chest pain.  Place patient on PRN IV hydralazine along with increasing Imdur dose.  Continue lisinopril-hydrochlorothiazide, spironolactone metoprolol amlodipine.  Closely follow blood pressure trends. 3. Hyperlipidemia on statins. 4. Peripheral vascular disease being followed by Dr. Donzetta Matters. 5. History of breast cancer.   DVT prophylaxis: Lovenox. Code Status: Full code. Family Communication: Discussed with patient. Disposition Plan: Home. Consults called: Cardiology. Admission status: Observation.   Rise Patience MD [facility] Pager [phone].  If 7PM-7AM, please contact night-coverage [email or web address] Password [facility]  03/02/2018, 3:13 AM

## 2018-03-02 NOTE — Progress Notes (Signed)
Patient admitted from ED for CP. Alert and oriented to room and surroundings. Question answered. MD notified. Telemetry applied and CCMD notified.

## 2018-03-02 NOTE — Progress Notes (Addendum)
Pt D/C home per MD order, Pt ambulates about 1000 ft, D/C instructions reviewed with pt and family, all questions answered. Pt aware of follow up appt., Pt  Instructed to pick up her prescriptions, IV removed and site looks clean and intact, Pt verbalized understanding of discharged instructions.

## 2018-03-02 NOTE — Discharge Summary (Signed)
Brenda [name], is a 62 y.o. female  DOB 1956-02-08  MRN [id number].  Admission date:  03/01/2018  Admitting Physician  Rise Patience, MD  Discharge Date:  03/02/2018   Primary MD  [name], MD  Recommendations for primary care physician for things to follow:   1)Very low-salt diet advised 2)Weigh yourself daily, call if you gain more than 3 pounds in 1 day or more than 5 pounds in 1 week as your diuretic medications may need to be adjusted 3)Limit your Fluid  intake to no more than 60 ounces (1.8 Liters) per day 4)Avoid ibuprofen/Advil/Aleve/Motrin/Goody Powders/Naproxen/BC powders/Meloxicam/Diclofenac/Indomethacin and other Nonsteroidal anti-inflammatory medications as these will make you more likely to bleed and can cause stomach ulcers, can also cause Kidney problems.  5) please note several changes to your medications  Admission Diagnosis  Chest Pain   Discharge Diagnosis  Chest Pain    Principal Problem:   Chest pain Active Problems:   Essential hypertension   Hypothyroidism      Past Medical History:  Diagnosis Date  . Cancer [facility])    breast cancer  . Carotid artery stenosis   . CHF (congestive heart failure) ([place])   . Coronary artery disease   . Depression   . Hypercholesteremia   . Hypertension   . Hyperthyroidism   . Stroke [facility])     Past Surgical History:  Procedure Laterality Date  . ABDOMINAL AORTOGRAM W/LOWER EXTREMITY N/A 06/13/2017   Procedure: ABDOMINAL AORTOGRAM W/LOWER EXTREMITY;  Surgeon: Waynetta Sandy, MD;  Location: [place] [facility];  Service: Cardiovascular;  Laterality: N/A;  Bilateral  . ABDOMINAL HYSTERECTOMY    . BREAST LUMPECTOMY  1999  . BREAST LUMPECTOMY     left breast  . CARDIAC SURGERY    . CAROTID ENDARTERECTOMY  04-2000   left  . CORONARY ARTERY BYPASS GRAFT  2001   x 5 (left internal mammary artery to left anterior   descending coronary artery  . LEFT HEART CATH AND CORS/GRAFTS ANGIOGRAPHY N/A 02/21/2018   Procedure: LEFT HEART CATH AND CORS/GRAFTS ANGIOGRAPHY;  Surgeon: Lorretta Harp, MD;  Location: [place] [facility];  Service: Cardiovascular;  Laterality: N/A;  . PERIPHERAL VASCULAR INTERVENTION  06/13/2017   Procedure: PERIPHERAL VASCULAR INTERVENTION;  Surgeon: Waynetta Sandy, MD;  Location: [place] [facility];  Service: Cardiovascular;;  Bilateral Iliac Stents       HPI  from the history and physical done on the day of admission:     Chief Complaint: Chest pain.  HPI: Brenda [name] is a 62 y.o. female with history of CAD status post CABG, hypertension, hyperlipidemia, history of stroke who was recently admitted for chest pain and had cardiac cath and at that time was decided to be managing conservatively last week, presents with chest pain.  Patient started having chest pain at home while doing her household chores.  Chest pain is retrosternal radiating to the neck.  Denies any associated shortness of breath.  Denies any productive cough fever or chills.  ED Course: In the ER patient's chest pain improved but had some neck pain.  Chest x-ray was unremarkable EKG shows sinus bradycardia with LVH.  Cardiologist on-call was consulted patient admitted for further observation and management.  Patient's blood pressure on arrival was markedly elevated.    Hospital Course:     1)Atypical CP--history of CAD, ruled out for ACS this time around with cardiac enzymes and EKG, cardiology consult appreciated, isosorbide increased to 30 mg daily, will up with cardiology as outpatient in a couple weeks  2)CAD- s/p prior CABG, patient is now chest pain-free, ACS ruled out as above #1, cardiology consult appreciated, for now increase isosorbide to 30 mg daily, if recurrent chest pain persist cardiology will consider possible addition of Ranexa , continue aspirin, Crestor and Plavix for  now  3)HTN-stable, continue Toprol, amlodipine and isosorbide as well as lisinopril/HCTZ and Aldactone  4)PAD--- stable, continue continue aspirin, Crestor and Plavix , also continue isosorbide, f/u with Dr. Donzetta Matters   Discharge Condition: stable, Discharge home as per cardiology  Follow UP--- with Dr. Stanford Breed  Follow-up Information    Barrett, Evelene Croon, PA-C Follow up on 03/13/2018.   Specialties:  Cardiology, Radiology Why:  1:30 PM (Dr. Jacalyn Lefevre PA). hospital f/u.  Contact information: [address] [place] [address] [phone]            Consults obtained -cardiology  Diet and Activity recommendation:  As advised  Discharge Instructions     Discharge Instructions    (HEART FAILURE PATIENTS) Call MD:  Anytime you have any of the following symptoms: 1) 3 pound weight gain in 24 hours or 5 pounds in 1 week 2) shortness of breath, with or without a dry hacking cough 3) swelling in the hands, feet or stomach 4) if you have to sleep on extra pillows at night in order to breathe.   Complete by:  As directed    Call MD for:  difficulty breathing, headache or visual disturbances   Complete by:  As directed    Call MD for:  persistant dizziness or light-headedness   Complete by:  As directed    Call MD for:  persistant nausea and vomiting   Complete by:  As directed    Call MD for:  severe uncontrolled pain   Complete by:  As directed    Call MD for:  temperature >100.4   Complete by:  As directed    Diet - low sodium heart healthy   Complete by:  As directed    Discharge instructions   Complete by:  As directed    1)Very low-salt diet advised 2)Weigh yourself daily, call if you gain more than 3 pounds in 1 day or more than 5 pounds in 1 week as your diuretic medications may need to be adjusted 3)Limit your Fluid  intake to no more than 60 ounces (1.8 Liters) per day 4)Avoid ibuprofen/Advil/Aleve/Motrin/Goody Powders/Naproxen/BC  powders/Meloxicam/Diclofenac/Indomethacin and other Nonsteroidal anti-inflammatory medications as these will make you more likely to bleed and can cause stomach ulcers, can also cause Kidney problems.  5) please note several changes to your medications   Increase activity slowly   Complete by:  As directed         Discharge Medications     Allergies as of 03/02/2018      Reactions   Latex Other (See Comments)   Reaction not recalled      Medication List    TAKE these medications   amLODipine 10  MG tablet Commonly known as:  NORVASC Take 1 tablet (10 mg total) by mouth daily. What changed:  Another medication with the same name was removed. Continue taking this medication, and follow the directions you see here.   aspirin 81 MG tablet Take 1 tablet (81 mg total) by mouth daily. With Food What changed:  additional instructions   clopidogrel 75 MG tablet Commonly known as:  PLAVIX TAKE 1 TABLET BY MOUTH ONCE DAILY   isosorbide mononitrate 30 MG 24 hr tablet Commonly known as:  IMDUR Take 1 tablet (30 mg total) by mouth daily. What changed:  how much to take   lisinopril-hydrochlorothiazide 20-25 MG tablet Commonly known as:  PRINZIDE,ZESTORETIC Take 1 tablet by mouth daily.   metoprolol succinate 100 MG 24 hr tablet Commonly known as:  TOPROL-XL TAKE 1 TABLET BY MOUTH ONCE DAILY WITH  OR  IMMEDIATELY  FOLLOWING  A  MEAL   nitroGLYCERIN 0.4 MG SL tablet Commonly known as:  NITROSTAT 1 tablet under tongue every 5 minutes as needed for chest discomfort (max 2 Tab/day) What changed:    how much to take  how to take this  when to take this  reasons to take this  additional instructions   ONE-A-DAY WOMENS PO Take 1 tablet by mouth daily.   OVER THE COUNTER MEDICATION Take 1 tablet by mouth See admin instructions. Unnamed Back Pain OTC medication: Take 1 tablet by mouth every six hours as needed for back pain   Potassium Chloride ER 20 MEQ Tbcr Take 1 tablet  daily on Monday Wednesdays Fridays and Sundays   REFRESH PLUS OP Place 1-2 drops into both eyes as needed (for dryness or irritation only when wearing contacts).   rosuvastatin 20 MG tablet Commonly known as:  CRESTOR Take 20 mg by mouth daily. What changed:  Another medication with the same name was removed. Continue taking this medication, and follow the directions you see here.   spironolactone 25 MG tablet Commonly known as:  ALDACTONE Take 1 tablet (25 mg total) by mouth daily. What changed:  how much to take   venlafaxine XR 37.5 MG 24 hr capsule Commonly known as:  EFFEXOR XR Take 1 capsule (37.5 mg total) by mouth daily with breakfast.   Vitamin D-3 1000 units Caps Take 2,000 Units by mouth daily.       Major procedures and Radiology Reports - PLEASE review detailed and final reports for all details, in brief -     Dg Chest 2 View  Result Date: 03/02/2018 CLINICAL DATA:  Generalized chest pain. Catheterization 10 days ago. Today chest tightness radiating to the neck. Diaphoresis. Quit smoking 2003. History of hypertension and CHF. EXAM: CHEST - 2 VIEW COMPARISON:  02/19/2018 FINDINGS: Postoperative changes in the mediastinum. Surgical clips in the left axilla. Mild cardiac enlargement. No vascular congestion, edema, or consolidation. No blunting of costophrenic angles. No pneumothorax. Mediastinal contours appear intact. Calcification of the aorta. IMPRESSION: Mild cardiac enlargement.  No evidence of active pulmonary disease. Electronically Signed   By: Lucienne Capers M.D.   On: 03/02/2018 00:08   Dg Chest 2 View  Result Date: 02/19/2018 CLINICAL DATA:  Chest tightness and shortness of breath EXAM: CHEST - 2 VIEW COMPARISON:  November 28, 2003 FINDINGS: Lungs are clear. Heart is upper normal in size with pulmonary vascularity normal. No adenopathy. There is aortic atherosclerosis. Patient is status post coronary artery bypass grafting. There are surgical clips over the left  axillary region. IMPRESSION: No edema  or consolidation. Areas of postoperative change. There is aortic atherosclerosis. Aortic Atherosclerosis (ICD10-I70.0). Electronically Signed   By: Lowella Grip III M.D.   On: 02/19/2018 09:24    Micro Results   No results found for this or any previous visit (from the past 240 hour(s)).     Today   Subjective    Brenda [name] today has no complaints, chest pain-free post ambulation, tolerating isosorbide okay for now,          Patient has been seen and examined prior to discharge   Objective   Blood pressure 130/62, pulse (!) 53, temperature 97.7 F (36.5 C), temperature source Oral, resp. rate 19, height 5\' 5"  (1.651 m), weight 71.3 kg (157 lb 3.2 oz), SpO2 99 %.  No intake or output data in the 24 hours ending 03/02/18 1226  Exam Gen:- Awake Alert,  In no apparent distress  HEENT:- [place].AT, No sclera icterus Neck-Supple Neck,No JVD,.  Lungs-  CTAB , good air movement CV- S1, S2 normal, CABG scar Abd-  +ve B.Sounds, Abd Soft, No tenderness,    Extremity/Skin:- No  edema,   good pulses Psych-affect is appropriate, oriented x3 Neuro-no new focal deficits, no tremors   Data Review   CBC w Diff:  Lab Results  Component Value Date   WBC 6.0 03/02/2018   HGB 12.1 03/02/2018   HCT 36.4 03/02/2018   PLT 183 03/02/2018   LYMPHOPCT 28 02/20/2018   MONOPCT 13 02/20/2018   EOSPCT 1 02/20/2018   BASOPCT 0 02/20/2018    CMP:  Lab Results  Component Value Date   NA 138 03/01/2018   NA 141 02/28/2017   K 3.3 (L) 03/01/2018   CL 104 03/01/2018   CO2 25 03/01/2018   BUN 22 (H) 03/01/2018   BUN 13 02/28/2017   CREATININE 0.86 03/02/2018   CREATININE 1.04 (H) 01/31/2017   PROT 7.6 05/21/2015   ALBUMIN 4.3 05/21/2015   BILITOT 0.9 05/21/2015   ALKPHOS 56 05/21/2015   AST 22 05/21/2015   ALT 20 05/21/2015  .   Total Discharge time is about 33 minutes  Roxan Hockey M.D on 03/02/2018 at 12:26 PM  [facility]     Office  [phone]  Voice Recognition Viviann Spare dictation system was used to create this note, attempts have been made to correct errors. Please contact the author with questions and/or clarifications.

## 2018-03-02 NOTE — Progress Notes (Signed)
   Reviewed and agreed with Dr. Rhae Hammock consultation earlier this morning.  Her troponins have remained normal.  She is feeling comfortable currently.  Prior cardiac catheterization reviewed, medical management.  GEN: Well nourished, well developed, in no acute distress  HEENT: normal  Neck: no JVD, carotid bruits, or masses Cardiac: RRR; no murmurs, rubs, or gallops,no edema CABG scar Respiratory:  clear to auscultation bilaterally, normal work of breathing GI: soft, nontender, nondistended, + BS MS: no deformity or atrophy  Skin: warm and dry, no rash Neuro:  Alert and Oriented x 3, Strength and sensation are intact Psych: euthymic mood, full affect  A/P  Angina Coronary artery disease status post CABG Difficult to control hypertension  -Agree with increasing isosorbide from 15mg  to 30 mg once a day.  She understands potential side effect of headache, in fact she did have some headache with her 15 mg but this dissipated after several doses. -She is to continue with her amlodipine, Toprol.  Next step would be to increase her isosorbide once again.  Mentioned to her Ranexa as a possibility in the future as well for angina.  She is on multidrug regimen for hypertension.  This includes spironolactone 50.  Challenging.  She has seen the hypertension clinic, pharmacy run, in heart care.  I am comfortable with her being discharged.  We will establish follow-up in approximately 2 weeks with Dr. Stanford Breed or associate.  Candee Furbish, MD

## 2018-03-02 NOTE — Discharge Instructions (Signed)
1)Very low-salt diet advised 2)Weigh yourself daily, call if you gain more than 3 pounds in 1 day or more than 5 pounds in 1 week as your diuretic medications may need to be adjusted 3)Limit your Fluid  intake to no more than 60 ounces (1.8 Liters) per day 4)Avoid ibuprofen/Advil/Aleve/Motrin/Goody Powders/Naproxen/BC powders/Meloxicam/Diclofenac/Indomethacin and other Nonsteroidal anti-inflammatory medications as these will make you more likely to bleed and can cause stomach ulcers, can also cause Kidney problems.  5) please note several changes to your medications 6) follow-up with cardiologist  Dr. Stanford Breed

## 2018-03-02 NOTE — Plan of Care (Signed)
  Problem: Education: Goal: Knowledge of General Education information will improve Outcome: Progressing   Problem: Clinical Measurements: Goal: Ability to maintain clinical measurements within normal limits will improve Outcome: Progressing   Problem: Coping: Goal: Level of anxiety will decrease Outcome: Progressing   

## 2018-03-02 NOTE — Consult Note (Signed)
Cardiology Consultation:   Patient ID: Brenda [name]; [id number]; 08/26/1956   Admit date: 03/01/2018 Date of Consult: 03/02/2018  Primary Care Provider: [name], MD Primary Cardiologist: Stanford Breed  Patient Profile:   Brenda [name] is a 62 y.o. female with a hx of CADs/pCABG(2001), HTN, HLD, history of CVA, PADs/pbilateral common iliac stenting, carotid artery disease(followed by Dr. Kelli Churn history of breast CA who is being seen today for the evaluation of chest pain at the request of Dr. Tyrone Nine.  History of Present Illness:   Brenda [name] is a 62 y.o. female with a hx of CADs/pCABG(2001), HTN, HLD, history of CVA, PADs/pbilateral common iliac stenting, carotid artery disease(followed by Dr. Kelli Churn history of breast CA who is being seen today for the evaluation of chest pain.  The patient was recently hospitalized with chest pain and elevated troponin. She underwent LHC on 6/13 that showed extensive native disease with patent LIMA and occluded vein grafts. There was also subclavian stenosis proximal to the LIMA takeoff. She was not felt to have good targets for revascularization. TTE showed stable LVEF with diastolic dysfunction. The decision was made for treatment with anti-anginal therapy, and isosorbide was added on discharge.  She presents back to the ED this evening with recurrent chest pain. She reports that she went to the gym today and was doing housework without limitation. Several hours later she developed nonspecific, mild tightness in her chest that felt different than her prior chest pain. This pain improved, but she then developed pain in her neck and indigestion symptoms as well. She further endorses some cramping in her foot denies dyspnea or edema or other symptoms.   In the ED, she is hypertensive with SBP 170-190s. ECG showed sinus bradycardia with LVH and nonspecific ST changes similar to prior. Troponin was negative x1. She was admitted to the  hospitalist service, and cardiology was consulted for further evaluation.   Past Medical History:  Diagnosis Date  . Cancer [facility])    breast cancer  . Carotid artery stenosis   . CHF (congestive heart failure) ([place])   . Coronary artery disease   . Depression   . Hypercholesteremia   . Hypertension   . Hyperthyroidism   . Stroke [facility]     Past Surgical History:  Procedure Laterality Date  . ABDOMINAL AORTOGRAM W/LOWER EXTREMITY N/A 06/13/2017   Procedure: ABDOMINAL AORTOGRAM W/LOWER EXTREMITY;  Surgeon: Waynetta Sandy, MD;  Location: [place] [facility];  Service: Cardiovascular;  Laterality: N/A;  Bilateral  . ABDOMINAL HYSTERECTOMY    . BREAST LUMPECTOMY  1999  . BREAST LUMPECTOMY     left breast  . CARDIAC SURGERY    . CAROTID ENDARTERECTOMY  04-2000   left  . CORONARY ARTERY BYPASS GRAFT  2001   x 5 (left internal mammary artery to left anterior  descending coronary artery  . LEFT HEART CATH AND CORS/GRAFTS ANGIOGRAPHY N/A 02/21/2018   Procedure: LEFT HEART CATH AND CORS/GRAFTS ANGIOGRAPHY;  Surgeon: Lorretta Harp, MD;  Location: [place] [facility];  Service: Cardiovascular;  Laterality: N/A;  . PERIPHERAL VASCULAR INTERVENTION  06/13/2017   Procedure: PERIPHERAL VASCULAR INTERVENTION;  Surgeon: Waynetta Sandy, MD;  Location: [employer] [facility];  Service: Cardiovascular;;  Bilateral Iliac Stents     Home Medications:  Prior to Admission medications   Medication Sig Start Date End Date Taking? Authorizing Provider  amLODipine (NORVASC) 10 MG tablet Take 1 tablet (10 mg total) by mouth daily. 02/25/18  Yes Lelon Perla,  MD  aspirin 81 MG tablet Take 81 mg by mouth daily.     Yes [provider]  Carboxymethylcellulose Sodium (REFRESH PLUS OP) Place 1-2 drops into both eyes as needed (for dryness or irritation only when wearing contacts).   Yes [provider]  Cholecalciferol (VITAMIN D-3) 1000 units CAPS Take 2,000 Units by mouth  daily.   Yes [provider]  clopidogrel (PLAVIX) 75 MG tablet TAKE 1 TABLET BY MOUTH ONCE DAILY 02/24/18  Yes Waynetta Sandy, MD  isosorbide mononitrate (IMDUR) 30 MG 24 hr tablet Take 0.5 tablets (15 mg total) by mouth daily. 02/21/18 02/21/19 Yes Daune Perch, NP  lisinopril-hydrochlorothiazide (PRINZIDE,ZESTORETIC) 20-25 MG tablet Take 2 tablets by mouth daily. Patient taking differently: Take 1 tablet by mouth daily.  11/21/16  Yes Lelon Perla, MD  metoprolol succinate (TOPROL-XL) 100 MG 24 hr tablet TAKE 1 TABLET BY MOUTH ONCE DAILY WITH  OR  IMMEDIATELY  FOLLOWING  A  MEAL 02/18/18  Yes Crenshaw, Denice Bors, MD  Multiple Vitamins-Calcium (ONE-A-DAY WOMENS PO) Take 1 tablet by mouth daily.   Yes [provider]  OVER THE COUNTER MEDICATION Take 1 tablet by mouth See admin instructions. Unnamed Back Pain OTC medication: Take 1 tablet by mouth every six hours as needed for back pain   Yes [provider]  rosuvastatin (CRESTOR) 20 MG tablet Take 20 mg by mouth daily. 02/24/18  Yes [provider]  venlafaxine XR (EFFEXOR XR) 37.5 MG 24 hr capsule Take 1 capsule (37.5 mg total) by mouth daily with breakfast. 05/10/17  Yes Hulan Saas M, DO  amLODipine (NORVASC) 5 MG tablet TAKE ONE TABLET BY MOUTH ONCE DAILY Patient not taking: Reported on 03/01/2018 02/25/18   Lelon Perla, MD  nitroGLYCERIN (NITROSTAT) 0.4 MG SL tablet Place 1 tablet (0.4 mg total) under the tongue every 5 (five) minutes as needed for chest pain (x 3 tabs daily). 02/25/18   Lelon Perla, MD  rosuvastatin (CRESTOR) 40 MG tablet Take 1 tablet (40 mg total) by mouth daily. Patient not taking: Reported on 03/01/2018 06/18/14   Lelon Perla, MD  spironolactone (ALDACTONE) 25 MG tablet TAKE 2 TABLETS BY MOUTH ONCE DAILY 10/09/17   Lelon Perla, MD    Inpatient Medications: Scheduled Meds:  Continuous Infusions:  PRN Meds:   Allergies:    Allergies  Allergen  Reactions  . Latex Other (See Comments)    Reaction not recalled    Social History:   Social History   Socioeconomic History  . Marital status: Single    Spouse name: Not on file  . Number of children: 1  . Years of education: Not on file  . Highest education level: Not on file  Occupational History  . Occupation: Training and development officer: UNEMPLOYED  Social Needs  . Financial resource strain: Not on file  . Food insecurity:    Worry: Not on file    Inability: Not on file  . Transportation needs:    Medical: Not on file    Non-medical: Not on file  Tobacco Use  . Smoking status: Former Smoker    Types: Cigarettes    Last attempt to quit: 09/11/2001    Years since quitting: 16.4  . Smokeless tobacco: Never Used  Substance and Sexual Activity  . Alcohol use: No  . Drug use: No  . Sexual activity: Not on file  Lifestyle  . Physical activity:    Days per week: Not  on file    Minutes per session: Not on file  . Stress: Not on file  Relationships  . Social connections:    Talks on phone: Not on file    Gets together: Not on file    Attends religious service: Not on file    Active member of club or organization: Not on file    Attends meetings of clubs or organizations: Not on file    Relationship status: Not on file  . Intimate partner violence:    Fear of current or ex partner: Not on file    Emotionally abused: Not on file    Physically abused: Not on file    Forced sexual activity: Not on file  Other Topics Concern  . Not on file  Social History Narrative  . Not on file    Family History:    Family History  Problem Relation Age of Onset  . Lung cancer Mother        died at an early age  . Other Unknown        There is no Hx of premature coronary artery disease in her family     ROS:  Please see the history of present illness.  All other ROS reviewed and negative.     Physical Exam/Data:   Vitals:   03/02/18 0000 03/02/18 0015 03/02/18 0045  03/02/18 0100  BP: (!) 174/70 (!) 185/73 (!) 172/72 (!) 180/76  Pulse: (!) 53 (!) 58 (!) 59   Resp: 14 14 18 12   Temp:      TempSrc:      SpO2: 99% 100% 98%    No intake or output data in the 24 hours ending 03/02/18 0142 There were no vitals filed for this visit. There is no height or weight on file to calculate BMI.  General:  Well nourished, well developed, in no acute distress  HEENT: normal Neck: no JVD Cardiac:  normal S1, S2; RRR; no murmur  Lungs:  clear to auscultation bilaterally, no wheezing, rhonchi or rales  Abd: soft, nontender, no hepatomegaly  Ext: no edema Musculoskeletal:  No deformities, BUE and BLE strength normal and equal Skin: warm and dry  Neuro:  No focal abnormalities noted Psych:  Normal affect   EKG:  The EKG was personally reviewed and demonstrates:  As above Telemetry:  Telemetry was personally reviewed and demonstrates:  Sinus bradycardia with no events.   Relevant CV Studies: LHC 02/21/2018:    The left ventricular systolic function is normal.  LV end diastolic pressure is normal.  The left ventricular ejection fraction is 55-65% by visual estimate.  Ost LM to Mid LM lesion is 80% stenosed.  Dist RCA lesion is 100% stenosed.  Prox RCA lesion is 30% stenosed.  Ost LAD to Prox LAD lesion is 50% stenosed.  Prox LAD to Mid LAD lesion is 50% stenosed.  Ost Ramus to Ramus lesion is 95% stenosed.  Ost 1st Mrg lesion is 100% stenosed.  Origin to Prox Graft lesion before Ramus is 100% stenosed.   IMPRESSION:Ms. Hodgkiss is approximately 18 years post coronary artery bypass grafting. She was admitted with unstable angina and had low and flat troponins. Her anatomy is remarkable for an occluded sequential vein graft to the ramus and obtuse marginal branch. The occlusion was at the aorta. She did this does have high-grade distal left main disease. LIMA is intact as is her vein graft to the distal right supplying the PDA and PLA branches.  In addition,  she has a 50% left subclavian artery stenosis which appears calcific in his proximal to the LIMA takeoff although there does not appear to be a gradient across this. At this point, I recommend optimal antianginal medical therapy. I was hesitant to open up an 62 year old vein graft for fear of distal embolization. Also of note was that there was damping of the pigtail catheter in the LV suggesting clot. This was pulled back and evacuated. In addition, there was occlusion of the femoral sheath suggesting clot as well and this was removed in the Cath Lab and pressure held.  TTE 02/2018: - Left ventricle: The cavity size was normal. There was mild concentric hypertrophy. Systolic function was normal. The estimated ejection fraction was in the range of 60% to 65%. Wall motion was normal; there were no regional wall motion abnormalities. Features are consistent with a pseudonormal left ventricular filling pattern, with concomitant abnormal relaxation and increased filling pressure (grade 2 diastolic dysfunction). - Mitral valve: There was mild regurgitation directed centrally. - Left atrium: The atrium was mildly dilated.  Laboratory Data:  Chemistry Recent Labs  Lab 03/01/18 2239  NA 138  K 3.3*  CL 104  CO2 25  GLUCOSE 98  BUN 22*  CREATININE 1.06*  CALCIUM 9.2  GFRNONAA 54*  GFRAA >60  ANIONGAP 9    No results for input(s): PROT, ALBUMIN, AST, ALT, ALKPHOS, BILITOT in the last 168 hours. Hematology Recent Labs  Lab 03/01/18 2239  WBC 6.5  RBC 3.82*  HGB 11.7*  HCT 35.7*  MCV 93.5  MCH 30.6  MCHC 32.8  RDW 12.2  PLT 177   Cardiac EnzymesNo results for input(s): TROPONINI in the last 168 hours.  Recent Labs  Lab 03/01/18 2254  TROPIPOC 0.01    BNPNo results for input(s): BNP, PROBNP in the last 168 hours.  DDimer No results for input(s): DDIMER in the last 168 hours.  Radiology/Studies:  Dg Chest 2 View  Result Date:  03/02/2018 CLINICAL DATA:  Generalized chest pain. Catheterization 10 days ago. Today chest tightness radiating to the neck. Diaphoresis. Quit smoking 2003. History of hypertension and CHF. EXAM: CHEST - 2 VIEW COMPARISON:  02/19/2018 FINDINGS: Postoperative changes in the mediastinum. Surgical clips in the left axilla. Mild cardiac enlargement. No vascular congestion, edema, or consolidation. No blunting of costophrenic angles. No pneumothorax. Mediastinal contours appear intact. Calcification of the aorta. IMPRESSION: Mild cardiac enlargement.  No evidence of active pulmonary disease. Electronically Signed   By: Lucienne Capers M.D.   On: 03/02/2018 00:08    Assessment and Plan:   Chest pain CAD s/p CABG The patient has a history of severe CAD with recent LHC showing patent LIMA (with proximal subclavian stenosis) and occluded vein grafts. The decision was made for medical management. She presents back to the ED with recurrent chest pain that is different than her prior symptoms. ECG is stable and troponin is now negative. Her presentation is reassuring against MI. The etiology of her symptoms are unclear but may be due to chronic angina, symptomatic hypertension, or non-cardiac etiology (especially given change in quality of pain with multiple other associated symptoms). At this time, she would benefit from ongoing monitoring of troponin levels, optimization of anti-anginal and antihypertensive therapies, and workup for other possible causes of chest pain. -Continue to trend troponin and monitor symptoms.  -Recommend increasing isosorbide from 15 to 30 mg daily. This can likely be further increased. -Continue home ASA, clopidogrel -Continue metoprolol -Continue rosuvastatin   HTN BP  signifcantly elevated in the ED. This may be contributing to chest pain. -Continue home antihypertensive therapy. May need further adjustment to control BP  HLD -Continue rosuvastatin  Carotid artery  disease Subclavian stenosis  -Continue antiplatelet therapy and statin as above  For questions or updates, please contact [place] Please consult [email or web address] for contact info under Cardiology/STEMI.   Signed, Nila Nephew, MD  03/02/2018 1:42 AM

## 2018-03-02 NOTE — ED Notes (Signed)
Pt daughter Xiadani Damman [phone]

## 2018-03-13 ENCOUNTER — Encounter: Payer: Self-pay | Admitting: Physician Assistant

## 2018-03-13 ENCOUNTER — Ambulatory Visit (INDEPENDENT_AMBULATORY_CARE_PROVIDER_SITE_OTHER): Payer: Medicare Other | Admitting: Physician Assistant

## 2018-03-13 VITALS — BP 138/76 | HR 69 | Ht 65.0 in | Wt 159.0 lb

## 2018-03-13 DIAGNOSIS — I739 Peripheral vascular disease, unspecified: Secondary | ICD-10-CM

## 2018-03-13 DIAGNOSIS — I2510 Atherosclerotic heart disease of native coronary artery without angina pectoris: Secondary | ICD-10-CM | POA: Diagnosis not present

## 2018-03-13 DIAGNOSIS — E785 Hyperlipidemia, unspecified: Secondary | ICD-10-CM | POA: Diagnosis not present

## 2018-03-13 DIAGNOSIS — I10 Essential (primary) hypertension: Secondary | ICD-10-CM

## 2018-03-13 NOTE — Progress Notes (Signed)
Cardiology Office Note   Date:  03/13/2018   ID:  Brenda [name], DOB [birth year], MRN [id number]  PCP:  [name], MD  Cardiologist:  Dr Stanford Breed 09/19/2016 Rosaria Ferries, PA-C   Chief Complaint  Patient presents with  . s/p CATH    pt went back to the hospital the weekend after her heart cath due to chest pain; she states the readjusted her meds, states she has not had chest pain since then.    History of Present Illness: Brenda [name] is a 62 y.o. female with a history of breast CA, carotid disease s/p L CEA, HTN, HLD, CVA, CABG 2001, bilat iliac stents 2018, hyperthyroid  Admitted 6/21-6/22/2019 for chest pain, ACS ruled out, Imdur increased to 30 mg daily Admitted 6/11- 02/21/2018 for chest pain with hypertensive emergency and elevated troponin, cath with medical management for occluded SVG-RI-OM, LIMA-LAD & SVG-PDA-PLA ok. L subclavian 50% but no steal  Rya Rausch presents for cardiology follow up.   She has started walking again, walking about an hour. Gets 2-2 1/2 miles in. No chest pain or SOB. Walks early am when it is cooler.   She has not had leg pain.   No LE edema, no orthopnea or PND. No DOE.   BP has been better controlled since d/c, SBP 120s-130s most of the time.   Had one episode of chest pain/sob, took SL NTG x 1 and sx resolved.   She follows with Dr Forde Dandy for her thyroid. Has seen him recently and was doing well.    Past Medical History:  Diagnosis Date  . Cancer [facility])    breast cancer  . Carotid artery stenosis   . CHF (congestive heart failure) ([place])   . Coronary artery disease 2001  . Depression   . Hypercholesteremia   . Hypertension   . Hyperthyroidism   . Stroke [facility])     Past Surgical History:  Procedure Laterality Date  . ABDOMINAL AORTOGRAM W/LOWER EXTREMITY N/A 06/13/2017   Procedure: ABDOMINAL AORTOGRAM W/LOWER EXTREMITY;  Surgeon: Waynetta Sandy, MD;  Location: [place] [facility];  Service: Cardiovascular;   Laterality: N/A;  Bilateral  . ABDOMINAL HYSTERECTOMY    . BREAST LUMPECTOMY  1999  . BREAST LUMPECTOMY     left breast  . CAROTID ENDARTERECTOMY Left 04/2000  . CORONARY ARTERY BYPASS GRAFT  2001   x 5 (left internal mammary artery to left anterior  descending coronary artery  . LEFT HEART CATH AND CORS/GRAFTS ANGIOGRAPHY N/A 02/21/2018   Procedure: LEFT HEART CATH AND CORS/GRAFTS ANGIOGRAPHY;  Surgeon: Lorretta Harp, MD;  Location: [place] [facility];  Service: Cardiovascular;  Laterality: N/A;  . PERIPHERAL VASCULAR INTERVENTION  06/13/2017   Procedure: PERIPHERAL VASCULAR INTERVENTION;  Surgeon: Waynetta Sandy, MD;  Location: [place] [facility];  Service: Cardiovascular;;  Bilateral Iliac Stents    Current Outpatient Medications  Medication Sig Dispense Refill  . amLODipine (NORVASC) 10 MG tablet Take 1 tablet (10 mg total) by mouth daily. 90 tablet 0  . aspirin 81 MG tablet Take 1 tablet (81 mg total) by mouth daily. With Food 30 tablet 2  . Carboxymethylcellulose Sodium (REFRESH PLUS OP) Place 1-2 drops into both eyes as needed (for dryness or irritation only when wearing contacts).    . Cholecalciferol (VITAMIN D-3) 1000 units CAPS Take 2,000 Units by mouth daily.    . clopidogrel (PLAVIX) 75 MG tablet TAKE 1 TABLET BY MOUTH ONCE DAILY 30 tablet 3  .  isosorbide mononitrate (IMDUR) 30 MG 24 hr tablet Take 1 tablet (30 mg total) by mouth daily. 30 tablet 5  . lisinopril-hydrochlorothiazide (PRINZIDE,ZESTORETIC) 20-25 MG tablet Take 1 tablet by mouth daily. 30 tablet 1  . metoprolol succinate (TOPROL-XL) 100 MG 24 hr tablet TAKE 1 TABLET BY MOUTH ONCE DAILY WITH  OR  IMMEDIATELY  FOLLOWING  A  MEAL 90 tablet 0  . Multiple Vitamins-Calcium (ONE-A-DAY WOMENS PO) Take 1 tablet by mouth daily.    . nitroGLYCERIN (NITROSTAT) 0.4 MG SL tablet 1 tablet under tongue every 5 minutes as needed for chest discomfort (max 2 Tab/day) 25 tablet 1  . OVER THE COUNTER MEDICATION Take 1  tablet by mouth See admin instructions. Unnamed Back Pain OTC medication: Take 1 tablet by mouth every six hours as needed for back pain    . Potassium Chloride ER 20 MEQ TBCR Take 1 tablet daily on Monday Wednesdays Fridays and Sundays 30 tablet 1  . rosuvastatin (CRESTOR) 20 MG tablet Take 20 mg by mouth daily.  1  . spironolactone (ALDACTONE) 25 MG tablet Take 1 tablet (25 mg total) by mouth daily. 90 tablet 3  . venlafaxine XR (EFFEXOR XR) 37.5 MG 24 hr capsule Take 1 capsule (37.5 mg total) by mouth daily with breakfast. 30 capsule 1   No current facility-administered medications for this visit.     Allergies:   Latex    Social History:  The patient  reports that she quit smoking about 16 years ago. Her smoking use included cigarettes. She has never used smokeless tobacco. She reports that she does not drink alcohol or use drugs.   Family History:  The patient's family history includes Lung cancer in her mother; Other in her unknown relative.    ROS:  Please see the history of present illness. All other systems are reviewed and negative.    PHYSICAL EXAM: VS:  BP 138/76   Pulse 69   Ht 5\' 5"  (1.651 m)   Wt 159 lb (72.1 kg)   BMI 26.46 kg/m  , BMI Body mass index is 26.46 kg/m. GEN: Well nourished, well developed, female in no acute distress  HEENT: normal for age  Neck: no JVD, bilat carotid bruits, no masses Cardiac: RRR; 2/6 murmur, no rubs, or gallops Respiratory:  clear to auscultation bilaterally, normal work of breathing GI: soft, nontender, nondistended, + BS MS: no deformity or atrophy; no edema; distal pulses are 2+ in all 4 extremities   Skin: warm and dry, no rash Neuro:  Strength and sensation are intact Psych: euthymic mood, full affect   EKG:  EKG is not ordered today.  Echo 02/20/18 Study Conclusions - Left ventricle: The cavity size was normal. There was mildconcentric hypertrophy. Systolic function was normal. Theestimated ejection fraction was in  the range of 60% to 65%. Wallmotion was normal; there were no regional wall motionabnormalities. Features are consistent with a pseudonormal leftventricular filling pattern, with concomitant abnormal relaxationand increased filling pressure (grade 2 diastolic dysfunction). - Mitral valve: There was mild regurgitation directed centrally. - Left atrium: The atrium was mildly dilated.   Cardiac CATH 02/21/18: Conclusion    The left ventricular systolic function is normal.  LV end diastolic pressure is normal.  The left ventricular ejection fraction is 55-65% by visual estimate.  Ost LM to Mid LM lesion is 80% stenosed.  Dist RCA lesion is 100% stenosed.  Prox RCA lesion is 30% stenosed.  Ost LAD to Prox LAD lesion is 50% stenosed.  Prox LAD to Mid LAD lesion is 50% stenosed.  Ost Ramus to Ramus lesion is 95% stenosed.  Ost 1st Mrg lesion is 100% stenosed.  Origin to Prox Graft lesion before Ramus is 100% stenosed.   IMPRESSION:Ms. Seidner is approximately 18 years post coronary artery bypass grafting. She was admitted with unstable angina and had low and flat troponins. Her anatomy is remarkable for an occluded sequential vein graft to the ramus and obtuse marginal branch. The occlusion was at the aorta. She did this does have high-grade distal left main disease. LIMA is intact as is her vein graft to the distal right supplying the PDA and PLA branches. 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 At this point, I recommend optimal antianginal medical therapy. I was hesitant to open up an 62 year old vein graft for fear of distal embolization. Also of note was that there was damping of the pigtail catheter in the LV suggesting clot. This was pulled back and evacuated. In addition, there was occlusion of the femoral sheath suggesting clot  as well and this was removed in the Cath Lab and pressure held. Diagnostic Diagram        Recent Labs: 02/20/2018: TSH 1.465 03/01/2018: BUN 22; Potassium 3.3; Sodium 138 03/02/2018: Creatinine, Ser 0.86; Hemoglobin 12.1; Platelets 183    Lipid Panel    Component Value Date/Time   CHOL 122 02/21/2018 0228   TRIG 63 02/21/2018 0228   HDL 40 (L) 02/21/2018 0228   CHOLHDL 3.1 02/21/2018 0228   VLDL 13 02/21/2018 0228   LDLCALC 69 02/21/2018 0228   LDLDIRECT 151.5 04/03/2013 0832     Wt Readings from Last 3 Encounters:  03/13/18 159 lb (72.1 kg)  03/02/18 157 lb 3.2 oz (71.3 kg)  02/21/18 151 lb 3.2 oz (68.6 kg)     Other studies Reviewed: Additional studies/ records that were reviewed today include: office notes, hospital records and testing.  ASSESSMENT AND PLAN:  1.  CAD:  -- no anginal sx on current rx -- ok to exercise, do anything that she tolerates -- mild CP relieved by rest or SL NTG in a few minutes is ok, helps build collaterals -- advised we can adjust meds by increasing Imdur or adding Ranexa as needed, she is doing ok for now  2. HTN -- BP control has improved -- continue to follow, let us know if she is consistently above 130/80 -- if so, split the lisinopril/HCTZ and increase the lisinopril  3. PAD -- no claudication sx -- increase activity as tolerated  4. Hyperlipidemia -- LDL goal < 70 -- LDL 69 at recent check - continue current therapy   Current medicines are reviewed at length with the patient today.  The patient does not have concerns regarding medicines.  The following changes have been made:  no change  Labs/ tests ordered today include:  No orders of the defined types were placed in this encounter.   Disposition:   FU with Dr Stanford Breed  Signed, Rosaria Ferries, PA-C  03/13/2018 1:42 PM    [place] Phone: [phone]; Fax: [phone]  This note was written with the assistance of speech recognition  software. Please excuse any transcriptional errors.

## 2018-03-13 NOTE — Patient Instructions (Signed)
Continue to follow blood pressure - goal = 130/80.  Continue a heart healthy diet.  Okay to exercise, be sensitive to your symptoms and fatigue. Do not over exert yourself.  Keep your previously scheduled appointment with Dr Stanford Breed.

## 2018-03-26 ENCOUNTER — Encounter: Payer: Self-pay | Admitting: Cardiology

## 2018-03-26 ENCOUNTER — Ambulatory Visit (INDEPENDENT_AMBULATORY_CARE_PROVIDER_SITE_OTHER): Payer: Medicare Other | Admitting: Cardiology

## 2018-03-26 ENCOUNTER — Telehealth: Payer: Self-pay | Admitting: Cardiology

## 2018-03-26 VITALS — BP 130/70 | HR 57 | Ht 65.0 in | Wt 154.2 lb

## 2018-03-26 DIAGNOSIS — E7800 Pure hypercholesterolemia, unspecified: Secondary | ICD-10-CM | POA: Diagnosis not present

## 2018-03-26 DIAGNOSIS — I10 Essential (primary) hypertension: Secondary | ICD-10-CM | POA: Diagnosis not present

## 2018-03-26 DIAGNOSIS — I2510 Atherosclerotic heart disease of native coronary artery without angina pectoris: Secondary | ICD-10-CM | POA: Diagnosis not present

## 2018-03-26 NOTE — Telephone Encounter (Signed)
New Message      Pt c/o medication issue:  1. Name of Medication:  lisinopril-hydrochlorothiazide (PRINZIDE,ZESTORETIC) 20-25 MG tablet  2. How are you currently taking this medication (dosage and times per day)? daily  3. Are you having a reaction (difficulty breathing--STAT)? Tightness in chest  4. What is your medication issue? Patient feels blood pressure has been running low. This morning it was 109/73.  Patient does have a record of her B/P but did not have with her at the time of call.

## 2018-03-26 NOTE — Progress Notes (Deleted)
HPI: FU coronary artery disease and cerebrovascular disease. Patient is status post coronary artery bypass graft in 2001 (LIMA to the LAD, sequential saphenous vein graft to the distal right coronary and PDA, sequential saphenous vein graft to the ramus and obtuse marginal). She also had carotid endarterectomy at that time as well. Renal Dopplers in January of 2012 showed normal renal arteries and no abdominal aortic aneurysm.  Carotid Dopplers November 2018 showed 40 to 59% right and 1 to 39% left stenosis.  ABIs March 2019 normal.  Cardiac catheterization June 2019 showed normal LV function, 80% left main, occluded right coronary artery, 95% ramus and 100% obtuse marginal.  Sequential saphenous vein graft to the PLA-PDA and LIMA to the LAD patent.  Sequential saphenous vein graft to the obtuse marginal and ramus intermedius occluded.  Medical therapy recommended.  Echocardiogram June 2019 showed normal LV function, moderate diastolic dysfunction, mild mitral regurgitation and mild left atrial enlargement.  Since last seen   Current Outpatient Medications  Medication Sig Dispense Refill  . amLODipine (NORVASC) 10 MG tablet Take 1 tablet (10 mg total) by mouth daily. 90 tablet 0  . aspirin 81 MG tablet Take 1 tablet (81 mg total) by mouth daily. With Food 30 tablet 2  . Carboxymethylcellulose Sodium (REFRESH PLUS OP) Place 1-2 drops into both eyes as needed (for dryness or irritation only when wearing contacts).    . Cholecalciferol (VITAMIN D-3) 1000 units CAPS Take 2,000 Units by mouth daily.    . clopidogrel (PLAVIX) 75 MG tablet TAKE 1 TABLET BY MOUTH ONCE DAILY 30 tablet 3  . isosorbide mononitrate (IMDUR) 30 MG 24 hr tablet Take 1 tablet (30 mg total) by mouth daily. 30 tablet 5  . lisinopril-hydrochlorothiazide (PRINZIDE,ZESTORETIC) 20-25 MG tablet Take 1 tablet by mouth daily. 30 tablet 1  . metoprolol succinate (TOPROL-XL) 100 MG 24 hr tablet TAKE 1 TABLET BY MOUTH ONCE DAILY WITH  OR   IMMEDIATELY  FOLLOWING  A  MEAL 90 tablet 0  . Multiple Vitamins-Calcium (ONE-A-DAY WOMENS PO) Take 1 tablet by mouth daily.    . nitroGLYCERIN (NITROSTAT) 0.4 MG SL tablet 1 tablet under tongue every 5 minutes as needed for chest discomfort (max 2 Tab/day) 25 tablet 1  . OVER THE COUNTER MEDICATION Take 1 tablet by mouth See admin instructions. Unnamed Back Pain OTC medication: Take 1 tablet by mouth every six hours as needed for back pain    . Potassium Chloride ER 20 MEQ TBCR Take 1 tablet daily on Monday Wednesdays Fridays and Sundays 30 tablet 1  . rosuvastatin (CRESTOR) 20 MG tablet Take 20 mg by mouth daily.  1  . spironolactone (ALDACTONE) 25 MG tablet Take 1 tablet (25 mg total) by mouth daily. 90 tablet 3  . venlafaxine XR (EFFEXOR XR) 37.5 MG 24 hr capsule Take 1 capsule (37.5 mg total) by mouth daily with breakfast. 30 capsule 1   No current facility-administered medications for this visit.      Past Medical History:  Diagnosis Date  . Cancer [facility])    breast cancer  . Carotid artery stenosis   . CHF (congestive heart failure) ([place])   . Coronary artery disease 2001  . Depression   . Hypercholesteremia   . Hypertension   . Hyperthyroidism   . Stroke [facility])     Past Surgical History:  Procedure Laterality Date  . ABDOMINAL AORTOGRAM W/LOWER EXTREMITY N/A 06/13/2017   Procedure: ABDOMINAL AORTOGRAM W/LOWER EXTREMITY;  Surgeon: Servando Snare  Harrell Gave, MD;  Location: [place] [facility];  Service: Cardiovascular;  Laterality: N/A;  Bilateral  . ABDOMINAL HYSTERECTOMY    . BREAST LUMPECTOMY  1999  . BREAST LUMPECTOMY     left breast  . CAROTID ENDARTERECTOMY Left 04/2000  . CORONARY ARTERY BYPASS GRAFT  2001   x 5 (left internal mammary artery to left anterior  descending coronary artery  . LEFT HEART CATH AND CORS/GRAFTS ANGIOGRAPHY N/A 02/21/2018   Procedure: LEFT HEART CATH AND CORS/GRAFTS ANGIOGRAPHY;  Surgeon: Lorretta Harp, MD;  Location: [place] [facility];   Service: Cardiovascular;  Laterality: N/A;  . PERIPHERAL VASCULAR INTERVENTION  06/13/2017   Procedure: PERIPHERAL VASCULAR INTERVENTION;  Surgeon: Waynetta Sandy, MD;  Location: [place] [facility];  Service: Cardiovascular;;  Bilateral Iliac Stents    Social History   Socioeconomic History  . Marital status: Single    Spouse name: Not on file  . Number of children: 1  . Years of education: Not on file  . Highest education level: Not on file  Occupational History  . 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 Financial resource strain: Not on file  . Food insecurity:    Worry: Not on file    Inability: Not on file  . Transportation needs:    Medical: Not on file    Non-medical: Not on file  Tobacco Use  . Smoking status: Former Smoker    Types: Cigarettes    Last attempt to quit: 09/11/2001    Years since quitting: 16.5  . Smokeless tobacco: Never Used  Substance and Sexual Activity  . Alcohol use: No  . Drug use: No  . Sexual activity: Not on file  Lifestyle  . Physical activity:    Days per week: Not on file    Minutes per session: Not on file  . Stress: Not on file  Relationships  . Social connections:    Talks on phone: Not on file    Gets together: Not on file    Attends religious service: Not on file    Active member of club or organization: Not on file    Attends meetings of clubs or organizations: Not on file    Relationship status: Not on file  . Intimate partner violence:    Fear of current or ex partner: Not on file    Emotionally abused: Not on file    Physically abused: Not on file    Forced sexual activity: Not on file  Other Topics Concern  . Not on file  Social History Narrative  . Not on file    Family History  Problem Relation Age of Onset  . Lung cancer Mother        died at an early age  . Other Unknown        There is no Hx of premature coronary artery disease in her family    ROS:  no fevers or chills, productive cough, hemoptysis, dysphasia, odynophagia, melena, hematochezia, dysuria, hematuria, rash, seizure activity, orthopnea, PND, pedal edema, claudication. Remaining systems are negative.  Physical Exam: Well-developed well-nourished in no acute distress.  Skin is warm and dry.  HEENT is normal.  Neck is supple.  Chest is clear to auscultation with normal expansion.  Cardiovascular exam is regular rate and rhythm.  Abdominal exam nontender or distended. No masses palpated. Extremities show no edema. neuro grossly intact  ECG- personally reviewed  A/P  1  Kirk Ruths, MD

## 2018-03-26 NOTE — Telephone Encounter (Signed)
Spoke with patient and she has been having lower blood pressures SBP  109-113 and monitor reading abnormal. Explained these numbers were ok This morning she was lightheaded but feeling better now. She has been exercising daily. Today she had chest tightness after walking into a doctors office, granddaughters appointment. Scheduled appointment this morning with Dr Stanford Breed

## 2018-03-26 NOTE — Patient Instructions (Signed)
Your physician recommends that you schedule a follow-up appointment in: 3 MONTHS WITH DR CRENSHAW  If you need a refill on your cardiac medications before your next appointment, please call your pharmacy.   

## 2018-03-26 NOTE — Progress Notes (Signed)
HPI: FU coronary artery disease and cerebrovascular disease. Patient is status post coronary artery bypass graft in 2001 (LIMA to the LAD, sequential saphenous vein graft to the distal right coronary and PDA, sequential saphenous vein graft to the ramus and obtuse marginal). She also had carotid endarterectomy at that time as well. Renal Dopplers in January of 2012 showed normal renal arteries and no abdominal aortic aneurysm.  Carotid Dopplers November 2018 showed 40 to 59% right and 1 to 39% left stenosis.  ABIs March 2019 normal.  Cardiac catheterization June 2019 showed normal LV function, 80% left main, occluded right coronary artery, 95% ramus and 100% obtuse marginal.  Sequential saphenous vein graft to the PLA-PDA and LIMA to the LAD patent.  Sequential saphenous vein graft to the obtuse marginal and ramus intermedius occluded.  Medical therapy recommended.  Echocardiogram June 2019 showed normal LV function, moderate diastolic dysfunction, mild mitral regurgitation and mild left atrial enlargement.  Since last seen  patient's medications were adjusted following her recent hospitalization.  Her blood pressure has been decreasing and average is now 120.  However she has had systolic of 093-235 this morning with mild lightheadedness.  She has occasional mild chest tightness but this has improved since her hospitalization.  She walked 3 miles recently with no chest pain and can exercise in the gym with no chest pain.  She has not had syncope.  Current Outpatient Medications  Medication Sig Dispense Refill  . amLODipine (NORVASC) 10 MG tablet Take 1 tablet (10 mg total) by mouth daily. 90 tablet 0  . aspirin 81 MG tablet Take 1 tablet (81 mg total) by mouth daily. With Food 30 tablet 2  . Carboxymethylcellulose Sodium (REFRESH PLUS OP) Place 1-2 drops into both eyes as needed (for dryness or irritation only when wearing contacts).    . Cholecalciferol (VITAMIN D-3) 1000 units CAPS Take 2,000  Units by mouth daily.    . clopidogrel (PLAVIX) 75 MG tablet TAKE 1 TABLET BY MOUTH ONCE DAILY 30 tablet 3  . isosorbide mononitrate (IMDUR) 30 MG 24 hr tablet Take 1 tablet (30 mg total) by mouth daily. 30 tablet 5  . lisinopril-hydrochlorothiazide (PRINZIDE,ZESTORETIC) 20-25 MG tablet Take 1 tablet by mouth daily. 30 tablet 1  . metoprolol succinate (TOPROL-XL) 100 MG 24 hr tablet TAKE 1 TABLET BY MOUTH ONCE DAILY WITH  OR  IMMEDIATELY  FOLLOWING  A  MEAL 90 tablet 0  . Multiple Vitamins-Calcium (ONE-A-DAY WOMENS PO) Take 1 tablet by mouth daily.    . nitroGLYCERIN (NITROSTAT) 0.4 MG SL tablet 1 tablet under tongue every 5 minutes as needed for chest discomfort (max 2 Tab/day) 25 tablet 1  . OVER THE COUNTER MEDICATION Take 1 tablet by mouth See admin instructions. Unnamed Back Pain OTC medication: Take 1 tablet by mouth every six hours as needed for back pain    . Potassium Chloride ER 20 MEQ TBCR Take 1 tablet daily on Monday Wednesdays Fridays and Sundays 30 tablet 1  . rosuvastatin (CRESTOR) 20 MG tablet Take 20 mg by mouth daily.  1  . spironolactone (ALDACTONE) 25 MG tablet Take 1 tablet (25 mg total) by mouth daily. 90 tablet 3  . venlafaxine XR (EFFEXOR XR) 37.5 MG 24 hr capsule Take 1 capsule (37.5 mg total) by mouth daily with breakfast. 30 capsule 1   No current facility-administered medications for this visit.      Past Medical History:  Diagnosis Date  . Cancer ([place])  breast cancer  . Carotid artery stenosis   . CHF (congestive heart failure) ([place])   . Coronary artery disease 2001  . Depression   . Hypercholesteremia   . Hypertension   . Hyperthyroidism   . Stroke [facility])     Past Surgical History:  Procedure Laterality Date  . ABDOMINAL AORTOGRAM W/LOWER EXTREMITY N/A 06/13/2017   Procedure: ABDOMINAL AORTOGRAM W/LOWER EXTREMITY;  Surgeon: Waynetta Sandy, MD;  Location: [place] [facility];  Service: Cardiovascular;  Laterality: N/A;  Bilateral  . ABDOMINAL  HYSTERECTOMY    . BREAST LUMPECTOMY  1999  . BREAST LUMPECTOMY     left breast  . CAROTID ENDARTERECTOMY Left 04/2000  . CORONARY ARTERY BYPASS GRAFT  2001   x 5 (left internal mammary artery to left anterior  descending coronary artery  . LEFT HEART CATH AND CORS/GRAFTS ANGIOGRAPHY N/A 02/21/2018   Procedure: LEFT HEART CATH AND CORS/GRAFTS ANGIOGRAPHY;  Surgeon: Lorretta Harp, MD;  Location: [place] [facility];  Service: Cardiovascular;  Laterality: N/A;  . PERIPHERAL VASCULAR INTERVENTION  06/13/2017   Procedure: PERIPHERAL VASCULAR INTERVENTION;  Surgeon: Waynetta Sandy, MD;  Location: [place] [facility];  Service: Cardiovascular;;  Bilateral Iliac Stents    Social History   Socioeconomic History  . Marital status: Single    Spouse name: Not on file  . Number of children: 1  . Years of education: Not on file  . Highest education level: Not on file  Occupational History  . 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 Financial resource strain: Not on file  . Food insecurity:    Worry: Not on file    Inability: Not on file  . Transportation needs:    Medical: Not on file    Non-medical: Not on file  Tobacco Use  . Smoking status: Former Smoker    Types: Cigarettes    Last attempt to quit: 09/11/2001    Years since quitting: 16.5  . Smokeless tobacco: Never Used  Substance and Sexual Activity  . Alcohol use: No  . Drug use: No  . Sexual activity: Not on file  Lifestyle  . Physical activity:    Days per week: Not on file    Minutes per session: Not on file  . Stress: Not on file  Relationships  . Social connections:    Talks on phone: Not on file    Gets together: Not on file    Attends religious service: Not on file    Active member of club or organization: Not on file    Attends meetings of clubs or organizations: Not on file    Relationship status: Not on file  . Intimate partner violence:    Fear of current  or ex partner: Not on file    Emotionally abused: Not on file    Physically abused: Not on file    Forced sexual activity: Not on file  Other Topics Concern  . Not on file  Social History Narrative  . Not on file    Family History  Problem Relation Age of Onset  . Lung cancer Mother        died at an early age  . Other Unknown        There is no Hx of premature coronary artery disease in her family    ROS: no fevers or chills, productive cough, hemoptysis, dysphasia, odynophagia, melena, hematochezia, dysuria, hematuria, rash, seizure activity, orthopnea,  PND, pedal edema, claudication. Remaining systems are negative.  Physical Exam: Well-developed well-nourished in no acute distress.  Skin is warm and dry.  HEENT is normal.  Neck is supple.  Chest is clear to auscultation with normal expansion.  Cardiovascular exam is regular rate and rhythm.  Abdominal exam nontender or distended. No masses palpated. Extremities show no edema. neuro grossly intact  ECG-sinus rhythm at a rate of 59.  Nonspecific ST changes.  Personally reviewed  A/P  1 chest tightness-patient's symptoms seem to be reasonably well controlled.  Electrocardiogram shows no ST changes.  We will continue medical therapy based on recent catheterization.  2 coronary artery disease-continue aspirin and statin.  3 hypertension-blood pressure is controlled.  She did state that her blood pressure was low recently.  However in general her systolic blood pressure is 120.  We will continue present regimen.  If she starts to develop dizziness with low blood pressure I will decrease lisinopril to allow room for antianginals.  4 carotid artery disease-we will arrange follow-up carotid Dopplers November 2019.  5 hyperlipidemia-continue statin.  Kirk Ruths, MD

## 2018-04-05 ENCOUNTER — Ambulatory Visit: Payer: Medicare Other | Admitting: Cardiology

## 2018-04-22 ENCOUNTER — Encounter: Payer: Self-pay | Admitting: Internal Medicine

## 2018-04-22 ENCOUNTER — Ambulatory Visit (INDEPENDENT_AMBULATORY_CARE_PROVIDER_SITE_OTHER): Payer: Medicare Other | Admitting: Cardiology

## 2018-04-22 ENCOUNTER — Encounter: Payer: Self-pay | Admitting: Cardiology

## 2018-04-22 VITALS — BP 108/60 | HR 63 | Ht 65.0 in | Wt 152.0 lb

## 2018-04-22 DIAGNOSIS — I10 Essential (primary) hypertension: Secondary | ICD-10-CM

## 2018-04-22 DIAGNOSIS — E7800 Pure hypercholesterolemia, unspecified: Secondary | ICD-10-CM

## 2018-04-22 DIAGNOSIS — I2510 Atherosclerotic heart disease of native coronary artery without angina pectoris: Secondary | ICD-10-CM | POA: Diagnosis not present

## 2018-04-22 NOTE — Progress Notes (Signed)
HPI: FU coronary artery disease and cerebrovascular disease. Patient is status post coronary artery bypass graft in 2001 (LIMA to the LAD, sequential saphenous vein graft to the distal right coronary and PDA, sequential saphenous vein graft to the ramus and obtuse marginal). She also had carotid endarterectomy at that time as well. Renal Dopplers in January of 2012 showed normal renal arteries and no abdominal aortic aneurysm.  Carotid Dopplers November 2018 showed 40 to 59% right and 1 to 39% left stenosis.  ABIs March 2019 normal.  Cardiac catheterization June 2019 showed normal LV function, 80% left main, occluded right coronary artery, 95% ramus and 100% obtuse marginal.  Sequential saphenous vein graft to the PLA-PDA and LIMA to the LAD patent.  Sequential saphenous vein graft to the obtuse marginal and ramus intermedius occluded.  Medical therapy recommended.  Echocardiogram June 2019 showed normal LV function, moderate diastolic dysfunction, mild mitral regurgitation and mild left atrial enlargement.  Since last seenthere is no dyspnea.  She has occasional exertional chest pain relieved with nitroglycerin.  No syncope.  She has had difficulties with increased sleepiness, diarrhea and constipation.  Some dizziness with standing.  Current Outpatient Medications  Medication Sig Dispense Refill  . amLODipine (NORVASC) 10 MG tablet Take 1 tablet (10 mg total) by mouth daily. 90 tablet 0  . aspirin 81 MG tablet Take 1 tablet (81 mg total) by mouth daily. With Food 30 tablet 2  . Carboxymethylcellulose Sodium (REFRESH PLUS OP) Place 1-2 drops into both eyes as needed (for dryness or irritation only when wearing contacts).    . Cholecalciferol (VITAMIN D-3) 1000 units CAPS Take 2,000 Units by mouth daily.    . clopidogrel (PLAVIX) 75 MG tablet TAKE 1 TABLET BY MOUTH ONCE DAILY 30 tablet 3  . isosorbide mononitrate (IMDUR) 30 MG 24 hr tablet Take 1 tablet (30 mg total) by mouth daily. 30 tablet 5    . lisinopril-hydrochlorothiazide (PRINZIDE,ZESTORETIC) 20-25 MG tablet Take 1 tablet by mouth daily. 30 tablet 1  . metoprolol succinate (TOPROL-XL) 100 MG 24 hr tablet TAKE 1 TABLET BY MOUTH ONCE DAILY WITH  OR  IMMEDIATELY  FOLLOWING  A  MEAL 90 tablet 0  . Multiple Vitamins-Calcium (ONE-A-DAY WOMENS PO) Take 1 tablet by mouth daily.    . nitroGLYCERIN (NITROSTAT) 0.4 MG SL tablet 1 tablet under tongue every 5 minutes as needed for chest discomfort (max 2 Tab/day) 25 tablet 1  . OVER THE COUNTER MEDICATION Take 1 tablet by mouth See admin instructions. Unnamed Back Pain OTC medication: Take 1 tablet by mouth every six hours as needed for back pain    . Potassium Chloride ER 20 MEQ TBCR Take 1 tablet daily on Monday Wednesdays Fridays and Sundays 30 tablet 1  . rosuvastatin (CRESTOR) 20 MG tablet Take 20 mg by mouth daily.  1  . spironolactone (ALDACTONE) 25 MG tablet Take 1 tablet (25 mg total) by mouth daily. 90 tablet 3  . venlafaxine XR (EFFEXOR XR) 37.5 MG 24 hr capsule Take 1 capsule (37.5 mg total) by mouth daily with breakfast. 30 capsule 1   No current facility-administered medications for this visit.      Past Medical History:  Diagnosis Date  . Cancer [facility])    breast cancer  . Carotid artery stenosis   . CHF (congestive heart failure) ([place])   . Coronary artery disease 2001  . Depression   . Hypercholesteremia   . Hypertension   . Hyperthyroidism   .  Stroke [facility])     Past Surgical History:  Procedure Laterality Date  . ABDOMINAL AORTOGRAM W/LOWER EXTREMITY N/A 06/13/2017   Procedure: ABDOMINAL AORTOGRAM W/LOWER EXTREMITY;  Surgeon: Waynetta Sandy, MD;  Location: [place] [facility];  Service: Cardiovascular;  Laterality: N/A;  Bilateral  . ABDOMINAL HYSTERECTOMY    . BREAST LUMPECTOMY  1999  . BREAST LUMPECTOMY     left breast  . CAROTID ENDARTERECTOMY Left 04/2000  . CORONARY ARTERY BYPASS GRAFT  2001   x 5 (left internal mammary artery to left anterior   descending coronary artery  . LEFT HEART CATH AND CORS/GRAFTS ANGIOGRAPHY N/A 02/21/2018   Procedure: LEFT HEART CATH AND CORS/GRAFTS ANGIOGRAPHY;  Surgeon: Lorretta Harp, MD;  Location: [place] [facility];  Service: Cardiovascular;  Laterality: N/A;  . PERIPHERAL VASCULAR INTERVENTION  06/13/2017   Procedure: PERIPHERAL VASCULAR INTERVENTION;  Surgeon: Waynetta Sandy, MD;  Location: [place] [facility];  Service: Cardiovascular;;  Bilateral Iliac Stents    Social History   Socioeconomic History  . Marital status: Single    Spouse name: Not on file  . Number of children: 1  . Years of education: Not on file  . Highest education level: Not on file  Occupational History  . 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 Financial resource strain: Not on file  . Food insecurity:    Worry: Not on file    Inability: Not on file  . Transportation needs:    Medical: Not on file    Non-medical: Not on file  Tobacco Use  . Smoking status: Former Smoker    Types: Cigarettes    Last attempt to quit: 09/11/2001    Years since quitting: 16.6  . Smokeless tobacco: Never Used  Substance and Sexual Activity  . Alcohol use: No  . Drug use: No  . Sexual activity: Not on file  Lifestyle  . Physical activity:    Days per week: Not on file    Minutes per session: Not on file  . Stress: Not on file  Relationships  . Social connections:    Talks on phone: Not on file    Gets together: Not on file    Attends religious service: Not on file    Active member of club or organization: Not on file    Attends meetings of clubs or organizations: Not on file    Relationship status: Not on file  . Intimate partner violence:    Fear of current or ex partner: Not on file    Emotionally abused: Not on file    Physically abused: Not on file    Forced sexual activity: Not on file  Other Topics Concern  . Not on file  Social History Narrative  . Not on  file    Family History  Problem Relation Age of Onset  . Lung cancer Mother        died at an early age  . Other Unknown        There is no Hx of premature coronary artery disease in her family    ROS: Diarrhea and fatigue but no fevers or chills, productive cough, hemoptysis, dysphasia, odynophagia, melena, hematochezia, dysuria, hematuria, rash, seizure activity, orthopnea, PND, pedal edema, claudication. Remaining systems are negative.  Physical Exam: Well-developed well-nourished in no acute distress.  Skin is warm and dry.  HEENT is normal.  Neck is supple.  Chest is clear to  auscultation with normal expansion.  Cardiovascular exam is regular rate and rhythm.  Abdominal exam nontender or distended. No masses palpated. Extremities show no edema. neuro grossly intact   A/P  1 coronary artery disease status post coronary artery bypass graft-occasional brief chest pain with exertion relieved with nitroglycerin.  Continue medical therapy including aspirin, Plavix and statin.  2 carotid artery disease-follow-up carotid Dopplers November 2019.  3 hypertension -patient describes some orthostatic symptoms.  Discontinue Spironolactone but continue remaining medications.  She is describing some daytime fatigue.  Change Toprol to evening.  Finally she is having some diarrhea.  Hopefully this will resolve with discontinuing Spironolactone.  We will otherwise need to try and change other medications if symptoms persist.  4 hyperlipidemia-continue statin.  Kirk Ruths, MD

## 2018-04-22 NOTE — Patient Instructions (Signed)
Medication Instructions:   STOP SPIRONOLACTONE   TAKE METOPROLOL IN THE EVENING  Follow-Up:  Your physician recommends that you schedule a follow-up appointment in: AS SCHEDULED

## 2018-04-24 ENCOUNTER — Ambulatory Visit: Payer: Self-pay | Admitting: *Deleted

## 2018-04-24 NOTE — Telephone Encounter (Signed)
Pt stating she has been experiencing constipation and diarrhea since last week. Pt thinks that episodes of diarrhea and constipation may be related to some of the medications she is taking. Pt states that on Saturday she did not experience constipation because she did not take her medications. Pt states she does not experience abdominal pain until 1-2 hours after eating. Pt states her last BM was yesterday and was brown in color. Pt states that when she does eat food she gets constipated. Pt stats that last she vomited once last week but has not had any episodes recently.Pt previously scheduled for appt on tomorrow. Pt advised that if symptoms worsen before appt to call the office back or seek care in the ED/Urgent Care.   Reason for Disposition . [1] MILD diarrhea (e.g., 1-3 or more stools than normal in past 24 hours) without known cause AND [2] present >  7 days  Answer Assessment - Initial Assessment Questions 1. DIARRHEA SEVERITY: "How bad is the diarrhea?" "How many extra stools have you had in the past 24 hours than normal?"    - NO DIARRHEA (SCALE 0)   - MILD (SCALE 1-3): Few loose or mushy BMs; increase of 1-3 stools over normal daily number of stools; mild increase in ostomy output.   -  MODERATE (SCALE 4-7): Increase of 4-6 stools daily over normal; moderate increase in ostomy output.  SEVERE (SCALE 8-10; OR 'WORST POSSIBLE'): Increase of 7 or more stools daily over normal; moderate increase in ostomy output; incontinence.  Mild; Pt states she only had one episode of diarrhea on yesterday 2. ONSET: "When did the diarrhea begin?"      Has been ongoing since last week 3. BM CONSISTENCY: "How loose or watery is the diarrhea?"      Diarrhea brown in color and also constipation 4. VOMITING: "Are you also vomiting?" If so, ask: "How many times in the past 24 hours?"      One episode of vomiting last week 5. ABDOMINAL PAIN: "Are you having any abdominal pain?" If yes: "What does it feel like?"  (e.g., crampy, dull, intermittent, constant)      Pt states the only time she has abdominal pain is 1-2 hours after eating 6. ABDOMINAL PAIN SEVERITY: If present, ask: "How bad is the pain?"  (e.g., Scale 1-10; mild, moderate, or severe)   - MILD (1-3): doesn't interfere with normal activities, abdomen soft and not tender to touch    - MODERATE (4-7): interferes with normal activities or awakens from sleep, tender to touch    - SEVERE (8-10): excruciating pain, doubled over, unable to do any normal activities       No complaints of pain currently 7. ORAL INTAKE: If vomiting, "Have you been able to drink liquids?" "How much fluids have you had in the past 24 hours?"     Pt has been able to eat and drink without vomiting 8. HYDRATION: "Any signs of dehydration?" (e.g., dry mouth [not just dry lips], too weak to stand, dizziness, new weight loss) "When did you last urinate?"     no 9. EXPOSURE: "Have you traveled to a foreign country recently?" "Have you been exposed to anyone with diarrhea?" "Could you have eaten any food that was spoiled?"     No 10. ANTIBIOTIC USE: "Are you taking antibiotics now or have you taken antibiotics in the past 2 months?"       No 11. OTHER SYMPTOMS: "Do you have any other symptoms?" (e.g., fever, blood  in stool)       No 12. PREGNANCY: "Is there any chance you are pregnant?" "When was your last menstrual period?"       No  Protocols used: DIARRHEA-A-AH

## 2018-04-25 ENCOUNTER — Encounter: Payer: Self-pay | Admitting: Internal Medicine

## 2018-04-25 ENCOUNTER — Ambulatory Visit (INDEPENDENT_AMBULATORY_CARE_PROVIDER_SITE_OTHER): Payer: Medicare Other | Admitting: Internal Medicine

## 2018-04-25 ENCOUNTER — Other Ambulatory Visit (INDEPENDENT_AMBULATORY_CARE_PROVIDER_SITE_OTHER): Payer: Medicare Other

## 2018-04-25 ENCOUNTER — Other Ambulatory Visit: Payer: Self-pay | Admitting: Family Medicine

## 2018-04-25 VITALS — BP 90/58 | HR 62 | Temp 98.1°F | Ht 65.0 in | Wt 151.0 lb

## 2018-04-25 DIAGNOSIS — R197 Diarrhea, unspecified: Secondary | ICD-10-CM | POA: Insufficient documentation

## 2018-04-25 LAB — CBC
HCT: 36.9 % (ref 36.0–46.0)
Hemoglobin: 12.6 g/dL (ref 12.0–15.0)
MCHC: 34.1 g/dL (ref 30.0–36.0)
MCV: 92.2 fl (ref 78.0–100.0)
Platelets: 223 10*3/uL (ref 150.0–400.0)
RBC: 4.01 Mil/uL (ref 3.87–5.11)
RDW: 13.8 % (ref 11.5–15.5)
WBC: 9.4 10*3/uL (ref 4.0–10.5)

## 2018-04-25 LAB — LIPASE: Lipase: 47 U/L (ref 11.0–59.0)

## 2018-04-25 LAB — COMPREHENSIVE METABOLIC PANEL
ALT: 23 U/L (ref 0–35)
AST: 25 U/L (ref 0–37)
Albumin: 4.4 g/dL (ref 3.5–5.2)
Alkaline Phosphatase: 58 U/L (ref 39–117)
BUN: 30 mg/dL — ABNORMAL HIGH (ref 6–23)
CO2: 27 mEq/L (ref 19–32)
Calcium: 10.1 mg/dL (ref 8.4–10.5)
Chloride: 98 mEq/L (ref 96–112)
Creatinine, Ser: 1.76 mg/dL — ABNORMAL HIGH (ref 0.40–1.20)
GFR: 37.35 mL/min — ABNORMAL LOW (ref >60.00)
Glucose, Bld: 101 mg/dL — ABNORMAL HIGH (ref 70–99)
Potassium: 3.3 mEq/L — ABNORMAL LOW (ref 3.5–5.1)
Sodium: 134 mEq/L — ABNORMAL LOW (ref 135–145)
Total Bilirubin: 0.8 mg/dL (ref 0.2–1.2)
Total Protein: 8.3 g/dL (ref 6.0–8.3)

## 2018-04-25 LAB — TSH: TSH: 1.51 u[IU]/mL (ref 0.35–4.50)

## 2018-04-25 LAB — T4, FREE: Free T4: 1.06 ng/dL (ref 0.60–1.60)

## 2018-04-25 MED ORDER — POTASSIUM CHLORIDE ER 20 MEQ PO TBCR: EXTENDED_RELEASE_TABLET | ORAL | 6 refills | Status: DC

## 2018-04-25 NOTE — Progress Notes (Signed)
   Subjective:    Patient ID: Brenda [name], female    DOB: [birth year], 62 y.o.   MRN: [id number]  HPI The patient is a 62 YO female coming in for diarrhea and constipation ongoing for 1-2 weeks. This is a new issue. Overall it is stable since onset. Denies blood in stool. She is constipated as she does not have her normal morning BM. She then eats lunch (her first meal of day) and gets cramping pains and then several episodes of diarrhea and then resolution of symptoms within a few hours. She also gets belching and gas and pressure with this. Denies fevers or chills. Possibly some weight loss for this. Saw her cardiologist and they stopped spironolactone which she stopped yesterday (can cause diarrhea and newly added within the last 1-2 months). She denies improvement. She denies change in diet or exercise recently. Is not drinking a lot of fluids. Denies lightheadedness or dizziness. Has not tried anything for this.   Review of Systems  Constitutional: Positive for appetite change and unexpected weight change.  HENT: Negative.   Eyes: Negative.   Respiratory: Negative for cough, chest tightness and shortness of breath.   Cardiovascular: Negative for chest pain, palpitations and leg swelling.  Gastrointestinal: Positive for abdominal distention, abdominal pain, constipation, diarrhea and nausea. Negative for anal bleeding, blood in stool, rectal pain and vomiting.  Musculoskeletal: Negative.   Skin: Negative.   Neurological: Negative.       Objective:   Physical Exam  Constitutional: She is oriented to person, place, and time. She appears well-developed and well-nourished.  HENT:  Head: Normocephalic and atraumatic.  Eyes: EOM are normal.  Neck: Normal range of motion.  Cardiovascular: Normal rate and regular rhythm.  Pulmonary/Chest: Effort normal and breath sounds normal. No respiratory distress. She has no wheezes. She has no rales.  Abdominal: Soft. Bowel sounds are normal. She exhibits  no distension. There is no tenderness. There is no rebound.  Musculoskeletal: She exhibits no edema.  Neurological: She is alert and oriented to person, place, and time. Coordination normal.  Skin: Skin is warm and dry.   Vitals:   04/25/18 1513  BP: (!) 90/58  Pulse: 62  Temp: 98.1 F (36.7 C)  TempSrc: Oral  SpO2: 97%  Weight: 151 lb (68.5 kg)  Height: 5\' 5"  (1.651 m)      Assessment & Plan:

## 2018-04-25 NOTE — Patient Instructions (Addendum)
You can try a probiotic to help the stomach.    It is okay to try imodium to see if this can reset the stomach.   Do not take the amlodipine until you are eating and drinking normally again.   Food Choices to Help Relieve Diarrhea, Adult When you have diarrhea, the foods you eat and your eating habits are very important. Choosing the right foods and drinks can help:  Relieve diarrhea.  Replace lost fluids and nutrients.  Prevent dehydration.  What general guidelines should I follow? Relieving diarrhea  Choose foods with less than 2 g or .07 oz. of fiber per serving.  Limit fats to less than 8 tsp (38 g or 1.34 oz.) a day.  Avoid the following: ? Foods and beverages sweetened with high-fructose corn syrup, honey, or sugar alcohols such as xylitol, sorbitol, and mannitol. ? Foods that contain a lot of fat or sugar. ? Fried, greasy, or spicy foods. ? High-fiber grains, breads, and cereals. ? Raw fruits and vegetables.  Eat foods that are rich in probiotics. These foods include dairy products such as yogurt and fermented milk products. They help increase healthy bacteria in the stomach and intestines (gastrointestinal tract, or GI tract).  If you have lactose intolerance, avoid dairy products. These may make your diarrhea worse.  Take medicine to help stop diarrhea (antidiarrheal medicine) only as told by your health care provider. Replacing nutrients  Eat small meals or snacks every 3-4 hours.  Eat bland foods, such as white rice, toast, or baked potato, until your diarrhea starts to get better. Gradually reintroduce nutrient-rich foods as tolerated or as told by your health care provider. This includes: ? Well-cooked protein foods. ? Peeled, seeded, and soft-cooked fruits and vegetables. ? Low-fat dairy products.  Take vitamin and mineral supplements as told by your health care provider. Preventing dehydration   Start by sipping water or a special solution to prevent  dehydration (oral rehydration solution, ORS). Urine that is clear or pale yellow means that you are getting enough fluid.  Try to drink at least 8-10 cups of fluid each day to help replace lost fluids.  You may add other liquids in addition to water, such as clear juice or decaffeinated sports drinks, as tolerated or as told by your health care provider.  Avoid drinks with caffeine, such as coffee, tea, or soft drinks.  Avoid alcohol. What foods are recommended? The items listed may not be a complete list. Talk with your health care provider about what dietary choices are best for you. Grains White rice. White, [place], or pita breads (fresh or toasted), including plain rolls, buns, or bagels. White pasta. Saltine, soda, or [place] crackers. Pretzels. Low-fiber cereal. Cooked cereals made with water (such as cornmeal, farina, or cream cereals). Plain muffins. Matzo. Melba toast. Zwieback. Vegetables Potatoes (without the skin). Most well-cooked and canned vegetables without skins or seeds. Tender lettuce. Fruits Apple sauce. Fruits canned in juice. Cooked apricots, cherries, grapefruit, peaches, pears, or plums. Fresh bananas and cantaloupe. Meats and other protein foods Baked or boiled chicken. Eggs. Tofu. Fish. Seafood. Smooth nut butters. Ground or well-cooked tender beef, ham, veal, lamb, pork, or poultry. Dairy Plain yogurt, kefir, and unsweetened liquid yogurt. Lactose-free milk, buttermilk, skim milk, or soy milk. Low-fat or nonfat hard cheese. Beverages Water. Low-calorie sports drinks. Fruit juices without pulp. Strained tomato and vegetable juices. Decaffeinated teas. Sugar-free beverages not sweetened with sugar alcohols. Oral rehydration solutions, if approved by your health care provider. Seasoning and  other foods Bouillon, broth, or soups made from recommended foods. What foods are not recommended? The items listed may not be a complete list. Talk with your health care provider  about what dietary choices are best for you. Grains Whole grain, whole wheat, bran, or rye breads, rolls, pastas, and crackers. Wild or brown rice. Whole grain or bran cereals. Barley. Oats and oatmeal. Corn tortillas or taco shells. Granola. Popcorn. Vegetables Raw vegetables. Fried vegetables. Cabbage, broccoli, Brussels sprouts, artichokes, baked beans, beet greens, corn, kale, legumes, peas, sweet potatoes, and yams. Potato skins. Cooked spinach and cabbage. Fruits Dried fruit, including raisins and dates. Raw fruits. Stewed or dried prunes. Canned fruits with syrup. Meat and other protein foods Fried or fatty meats. Deli meats. Chunky nut butters. Nuts and seeds. Beans and lentils. Berniece Salines. Hot dogs. Sausage. Dairy High-fat cheeses. Whole milk, chocolate milk, and beverages made with milk, such as milk shakes. Half-and-half. Cream. sour cream. Ice cream. Beverages Caffeinated beverages (such as coffee, tea, soda, or energy drinks). Alcoholic beverages. Fruit juices with pulp. Prune juice. Soft drinks sweetened with high-fructose corn syrup or sugar alcohols. High-calorie sports drinks. Fats and oils Butter. Cream sauces. Margarine. Salad oils. Plain salad dressings. Olives. Avocados. Mayonnaise. Sweets and desserts Sweet rolls, doughnuts, and sweet breads. Sugar-free desserts sweetened with sugar alcohols such as xylitol and sorbitol. Seasoning and other foods Honey. Hot sauce. Chili powder. Gravy. Cream-based or milk-based soups. Pancakes and waffles. Summary  When you have diarrhea, the foods you eat and your eating habits are very important.  Make sure you get at least 8-10 cups of fluid each day, or enough to keep your urine clear or pale yellow.  Eat bland foods and gradually reintroduce healthy, nutrient-rich foods as tolerated, or as told by your health care provider.  Avoid high-fiber, fried, greasy, or spicy foods. This information is not intended to replace advice given to  you by your health care provider. Make sure you discuss any questions you have with your health care provider. Document Released: 11/18/2003 Document Revised: 08/25/2016 Document Reviewed: 08/25/2016 Elsevier Interactive Patient Education  [employer].

## 2018-04-25 NOTE — Assessment & Plan Note (Signed)
Checking CBC, CMP, lipase, T4 and TSH. Hx hyperthyroidism which could be active. Need to rule out pancreatitis. Has cyst in pancreas which is not likely related. Advised to try imodium or probiotic to help. Could be related to spironolactone and we talked about how we should wait 1 week or so to see. Call if worsening symptoms or blood in diarrhea.

## 2018-04-25 NOTE — Telephone Encounter (Signed)
Refill done.  

## 2018-04-26 ENCOUNTER — Other Ambulatory Visit: Payer: Self-pay | Admitting: Internal Medicine

## 2018-04-26 DIAGNOSIS — N179 Acute kidney failure, unspecified: Secondary | ICD-10-CM

## 2018-05-06 ENCOUNTER — Other Ambulatory Visit (INDEPENDENT_AMBULATORY_CARE_PROVIDER_SITE_OTHER): Payer: Medicare Other

## 2018-05-06 DIAGNOSIS — N179 Acute kidney failure, unspecified: Secondary | ICD-10-CM | POA: Diagnosis not present

## 2018-05-06 LAB — BASIC METABOLIC PANEL
BUN: 20 mg/dL (ref 6–23)
CO2: 30 mEq/L (ref 19–32)
Calcium: 9.8 mg/dL (ref 8.4–10.5)
Chloride: 102 mEq/L (ref 96–112)
Creatinine, Ser: 1.13 mg/dL (ref 0.40–1.20)
GFR: 62.28 mL/min (ref >60.00)
Glucose, Bld: 106 mg/dL — ABNORMAL HIGH (ref 70–99)
Potassium: 3.7 mEq/L (ref 3.5–5.1)
Sodium: 138 mEq/L (ref 135–145)

## 2018-05-30 ENCOUNTER — Other Ambulatory Visit: Payer: Self-pay | Admitting: Cardiology

## 2018-05-30 DIAGNOSIS — I2510 Atherosclerotic heart disease of native coronary artery without angina pectoris: Secondary | ICD-10-CM

## 2018-05-30 DIAGNOSIS — E7800 Pure hypercholesterolemia, unspecified: Secondary | ICD-10-CM

## 2018-05-30 DIAGNOSIS — I10 Essential (primary) hypertension: Secondary | ICD-10-CM

## 2018-05-30 DIAGNOSIS — R0602 Shortness of breath: Secondary | ICD-10-CM

## 2018-06-05 ENCOUNTER — Telehealth: Payer: Self-pay | Admitting: Oncology

## 2018-06-05 NOTE — Telephone Encounter (Signed)
GBS PAL 10/18 - moved f/u to 11/1. Spoke with patient.

## 2018-06-07 ENCOUNTER — Other Ambulatory Visit: Payer: Self-pay

## 2018-06-07 ENCOUNTER — Ambulatory Visit (INDEPENDENT_AMBULATORY_CARE_PROVIDER_SITE_OTHER)
Admission: RE | Admit: 2018-06-07 | Discharge: 2018-06-07 | Disposition: A | Discharge status: Home / Self Care | Payer: Medicare Other | Source: Ambulatory Visit | Attending: Vascular Surgery | Admitting: Vascular Surgery

## 2018-06-07 ENCOUNTER — Encounter: Payer: Self-pay | Admitting: Vascular Surgery

## 2018-06-07 ENCOUNTER — Ambulatory Visit (HOSPITAL_COMMUNITY)
Admission: RE | Admit: 2018-06-07 | Discharge: 2018-06-07 | Disposition: A | Discharge status: Home / Self Care | Payer: Medicare Other | Source: Ambulatory Visit | Attending: Vascular Surgery | Admitting: Vascular Surgery

## 2018-06-07 ENCOUNTER — Ambulatory Visit (INDEPENDENT_AMBULATORY_CARE_PROVIDER_SITE_OTHER): Payer: Medicare Other | Admitting: Vascular Surgery

## 2018-06-07 VITALS — BP 152/94 | HR 82 | Resp 18 | Ht 65.0 in | Wt 153.0 lb

## 2018-06-07 DIAGNOSIS — Z87891 Personal history of nicotine dependence: Secondary | ICD-10-CM | POA: Diagnosis not present

## 2018-06-07 DIAGNOSIS — E785 Hyperlipidemia, unspecified: Secondary | ICD-10-CM | POA: Insufficient documentation

## 2018-06-07 DIAGNOSIS — I998 Other disorder of circulatory system: Secondary | ICD-10-CM | POA: Insufficient documentation

## 2018-06-07 DIAGNOSIS — I70229 Atherosclerosis of native arteries of extremities with rest pain, unspecified extremity: Secondary | ICD-10-CM

## 2018-06-07 DIAGNOSIS — I745 Embolism and thrombosis of iliac artery: Secondary | ICD-10-CM

## 2018-06-07 DIAGNOSIS — I739 Peripheral vascular disease, unspecified: Secondary | ICD-10-CM

## 2018-06-07 DIAGNOSIS — I708 Atherosclerosis of other arteries: Secondary | ICD-10-CM | POA: Insufficient documentation

## 2018-06-07 DIAGNOSIS — I10 Essential (primary) hypertension: Secondary | ICD-10-CM | POA: Insufficient documentation

## 2018-06-07 NOTE — Progress Notes (Signed)
Patient ID: Brenda [name], female   DOB: [birth year], 62 y.o.   MRN: [id number]  Reason for Consult: Follow-up (6-8 month f/u )   Referred by [name], *  Subjective:     HPI:  Brenda [name] is a 62 y.o. female with history of bilateral iliac artery stenting.  She also has a remote history of a left carotid endarterectomy.  She was recently hospitalized with chest pain from her previous CABG was found to have some stenosis but no intervention was undertaken.  She remains on aspirin Plavix and a statin.  She continues to work out quite religiously has lost 20 more pounds and is appears quite healthy on today's exam.  She has not had any new stroke TIA or amaurosis.  Recently she overexerted herself did have some abdominal pain but her feet are doing great without issues.  She has no further wounds at this time.  Past Medical History:  Diagnosis Date  . Cancer [facility])    breast cancer  . Carotid artery stenosis   . CHF (congestive heart failure) ([place])   . Coronary artery disease 2001  . Depression   . Hypercholesteremia   . Hypertension   . Hyperthyroidism   . Stroke [facility])    Family History  Problem Relation Age of Onset  . Lung cancer Mother        died at an early age  . Other Unknown        There is no Hx of premature coronary artery disease in her family   Past Surgical History:  Procedure Laterality Date  . ABDOMINAL AORTOGRAM W/LOWER EXTREMITY N/A 06/13/2017   Procedure: ABDOMINAL AORTOGRAM W/LOWER EXTREMITY;  Surgeon: Waynetta Sandy, MD;  Location: [place] [facility];  Service: Cardiovascular;  Laterality: N/A;  Bilateral  . ABDOMINAL HYSTERECTOMY    . BREAST LUMPECTOMY  1999  . BREAST LUMPECTOMY     left breast  . CAROTID ENDARTERECTOMY Left 04/2000  . CORONARY ARTERY BYPASS GRAFT  2001   x 5 (left internal mammary artery to left anterior  descending coronary artery  . LEFT HEART CATH AND CORS/GRAFTS ANGIOGRAPHY N/A 02/21/2018   Procedure: LEFT  HEART CATH AND CORS/GRAFTS ANGIOGRAPHY;  Surgeon: Lorretta Harp, MD;  Location: [place] [facility];  Service: Cardiovascular;  Laterality: N/A;  . PERIPHERAL VASCULAR INTERVENTION  06/13/2017   Procedure: PERIPHERAL VASCULAR INTERVENTION;  Surgeon: Waynetta Sandy, MD;  Location: [place] [facility];  Service: Cardiovascular;;  Bilateral Iliac Stents    Short Social History:  Social History   Tobacco Use  . Smoking status: Former Smoker    Types: Cigarettes    Last attempt to quit: 09/11/2001    Years since quitting: 16.7  . Smokeless tobacco: Never Used  Substance Use Topics  . Alcohol use: No    Allergies  Allergen Reactions  . Latex Other (See Comments)    Reaction not recalled    Current Outpatient Medications  Medication Sig Dispense Refill  . amLODipine (NORVASC) 10 MG tablet TAKE 1 TABLET BY MOUTH ONCE DAILY 90 tablet 0  . aspirin 81 MG tablet Take 1 tablet (81 mg total) by mouth daily. With Food 30 tablet 2  . Carboxymethylcellulose Sodium (REFRESH PLUS OP) Place 1-2 drops into both eyes as needed (for dryness or irritation only when wearing contacts).    . Cholecalciferol (VITAMIN D-3) 1000 units CAPS Take 2,000 Units by mouth daily.    . clopidogrel (PLAVIX) 75 MG tablet TAKE  1 TABLET BY MOUTH ONCE DAILY 30 tablet 3  . isosorbide mononitrate (IMDUR) 30 MG 24 hr tablet Take 1 tablet (30 mg total) by mouth daily. 30 tablet 5  . lisinopril-hydrochlorothiazide (PRINZIDE,ZESTORETIC) 20-25 MG tablet Take 1 tablet by mouth daily. 30 tablet 1  . metoprolol succinate (TOPROL-XL) 100 MG 24 hr tablet TAKE 1 TABLET BY MOUTH ONCE DAILY WITH OR IMMEDIATELY FOLLOWING A MEAL 90 tablet 0  . Multiple Vitamins-Calcium (ONE-A-DAY WOMENS PO) Take 1 tablet by mouth daily.    . nitroGLYCERIN (NITROSTAT) 0.4 MG SL tablet 1 tablet under tongue every 5 minutes as needed for chest discomfort (max 2 Tab/day) 25 tablet 1  . OVER THE COUNTER MEDICATION Take 1 tablet by mouth See admin  instructions. Unnamed Back Pain OTC medication: Take 1 tablet by mouth every six hours as needed for back pain    . Potassium Chloride ER 20 MEQ TBCR Take 1 tablet daily on Monday Wednesdays Fridays and Sundays 30 tablet 6  . rosuvastatin (CRESTOR) 20 MG tablet Take 20 mg by mouth daily.  1  . venlafaxine XR (EFFEXOR-XR) 37.5 MG 24 hr capsule TAKE 1 CAPSULE BY MOUTH ONCE DAILY WITH BREAKFAST 90 capsule 1   No current facility-administered medications for this visit.     Review of Systems  Constitutional:  Constitutional negative. HENT: HENT negative.  Eyes: Eyes negative.  Cardiovascular: Positive for chest pain.  GI: Gastrointestinal negative.  Musculoskeletal: Musculoskeletal negative.  Skin: Skin negative.  Neurological: Neurological negative. Hematologic: Hematologic/lymphatic negative.  Psychiatric: Psychiatric negative.        Objective:  Objective   Vitals:   06/07/18 0932  BP: (!) 152/94  Pulse: 82  Resp: 18  SpO2: 100%  Weight: 153 lb (69.4 kg)  Height: 5\' 5" (1.651 m)   Body mass index is 25.46 kg/m.  Physical Exam  Constitutional: She is oriented to person, place, and time. She appears well-developed.  HENT:  Head: Normocephalic.  Eyes: Pupils are equal, round, and reactive to light.  Neck: Normal range of motion. Neck supple.  Cardiovascular: Normal rate.  Murmur heard.  Systolic murmur is present. Pulses:      Radial pulses are 2+ on the right side, and 2+ on the left side.       Dorsalis pedis pulses are 2+ on the right side, and 2+ on the left side.       Posterior tibial pulses are 2+ on the left side.  There is a right carotid bruit  Abdominal: Soft. She exhibits no mass.  Musculoskeletal: Normal range of motion. She exhibits no edema.  Neurological: She is alert and oriented to person, place, and time.  Skin: Skin is warm and dry. Capillary refill takes less than 2 seconds.  Psychiatric: She has a normal mood and affect. Her behavior is normal.  Judgment and thought content normal.    Data: I have independently interpreted her ABIs to be triphasic in 1.09 bilaterally. And also independently interpreted her iliac duplex which demonstrates 59% stenosis on the right stent at peak velocity to 74 which is actually lower than her previous exam.  On the left side is triphasic throughout.     Assessment/Plan:     62  year old female follows up from previous history of left carotid endarterectomy and bilateral common iliac artery stenting.  She will have carotid duplex performed with her cardiology follow-up next month with a known 40 to 59% stenosis on the right and a bruit on today's exam.  From a  lower extremity stenting standpoint she has done terrific and this is most likely secondary to her modification of her risk factors.  She will continue aspirin Plavix and I will see her in 1 year with repeat ABIs and aortoiliac duplex.     Waynetta Sandy MD [facility]

## 2018-06-12 ENCOUNTER — Other Ambulatory Visit: Payer: Self-pay | Admitting: General Surgery

## 2018-06-12 DIAGNOSIS — D490 Neoplasm of unspecified behavior of digestive system: Secondary | ICD-10-CM

## 2018-06-13 ENCOUNTER — Other Ambulatory Visit: Payer: Self-pay | Admitting: Cardiology

## 2018-06-13 DIAGNOSIS — I2510 Atherosclerotic heart disease of native coronary artery without angina pectoris: Secondary | ICD-10-CM

## 2018-06-13 DIAGNOSIS — E7800 Pure hypercholesterolemia, unspecified: Secondary | ICD-10-CM

## 2018-06-13 DIAGNOSIS — R0602 Shortness of breath: Secondary | ICD-10-CM

## 2018-06-13 DIAGNOSIS — I10 Essential (primary) hypertension: Secondary | ICD-10-CM

## 2018-06-13 MED ORDER — METOPROLOL SUCCINATE ER 100 MG PO TB24: 100.0000 mg | ORAL_TABLET | Freq: Every day | ORAL | 3 refills | Status: DC

## 2018-06-13 NOTE — Telephone Encounter (Signed)
°*  STAT* If patient is at the pharmacy, call can be transferred to refill team.   1. Which medications need to be refilled? (please list name of each medication and dose if known) Metoprolol- pt says she needs her medicine been trying to get it .  2, Which pharmacy/location (including street and city if local pharmacy) is medication to be sent to? [place], [place]  3. Do they need a 30 day or 90 day supply? 90 and refills

## 2018-06-17 ENCOUNTER — Other Ambulatory Visit: Payer: Self-pay | Admitting: *Deleted

## 2018-06-17 NOTE — Progress Notes (Deleted)
HPI: FU coronary artery disease and cerebrovascular disease. Patient is status post coronary artery bypass graft in 2001 (LIMA to the LAD, sequential saphenous vein graft to the distal right coronary and PDA, sequential saphenous vein graft to the ramus and obtuse marginal). She also had carotid endarterectomy at that time as well. Renal Dopplers in January of 2012 showed normal renal arteries and no abdominal aortic aneurysm. Carotid Dopplers November 2018 showed 40 to 59% right and 1 to 39% left stenosis. ABIs March 2019 normal. Cardiac catheterization June 2019 showed normal LV function, 80% left main, occluded right coronary artery, 95% ramus and 100% obtuse marginal. Sequential saphenous vein graft to the PLA-PDA and LIMA to the LAD patent. Sequential saphenous vein graft to the obtuse marginal and ramus intermedius occluded. Medical therapy recommended. Echocardiogram June 2019 showed normal LV function, moderate diastolic dysfunction, mild mitral regurgitation and mild left atrial enlargement. Since last seen  Current Outpatient Medications  Medication Sig Dispense Refill  . amLODipine (NORVASC) 10 MG tablet TAKE 1 TABLET BY MOUTH ONCE DAILY 90 tablet 0  . aspirin 81 MG tablet Take 1 tablet (81 mg total) by mouth daily. With Food 30 tablet 2  . Carboxymethylcellulose Sodium (REFRESH PLUS OP) Place 1-2 drops into both eyes as needed (for dryness or irritation only when wearing contacts).    . Cholecalciferol (VITAMIN D-3) 1000 units CAPS Take 2,000 Units by mouth daily.    . clopidogrel (PLAVIX) 75 MG tablet TAKE 1 TABLET BY MOUTH ONCE DAILY 30 tablet 3  . isosorbide mononitrate (IMDUR) 30 MG 24 hr tablet Take 1 tablet (30 mg total) by mouth daily. 30 tablet 5  . lisinopril-hydrochlorothiazide (PRINZIDE,ZESTORETIC) 20-25 MG tablet Take 1 tablet by mouth daily. 30 tablet 1  . metoprolol succinate (TOPROL-XL) 100 MG 24 hr tablet Take 1 tablet (100 mg total) by mouth daily. Take with  or immediately following a meal. 90 tablet 3  . Multiple Vitamins-Calcium (ONE-A-DAY WOMENS PO) Take 1 tablet by mouth daily.    . nitroGLYCERIN (NITROSTAT) 0.4 MG SL tablet 1 tablet under tongue every 5 minutes as needed for chest discomfort (max 2 Tab/day) 25 tablet 1  . OVER THE COUNTER MEDICATION Take 1 tablet by mouth See admin instructions. Unnamed Back Pain OTC medication: Take 1 tablet by mouth every six hours as needed for back pain    . Potassium Chloride ER 20 MEQ TBCR Take 1 tablet daily on Monday Wednesdays Fridays and Sundays 30 tablet 6  . rosuvastatin (CRESTOR) 20 MG tablet Take 20 mg by mouth daily.  1  . venlafaxine XR (EFFEXOR-XR) 37.5 MG 24 hr capsule TAKE 1 CAPSULE BY MOUTH ONCE DAILY WITH BREAKFAST 90 capsule 1   No current facility-administered medications for this visit.      Past Medical History:  Diagnosis Date  . Cancer [facility])    breast cancer  . Carotid artery stenosis   . CHF (congestive heart failure) ([place])   . Coronary artery disease 2001  . Depression   . Hypercholesteremia   . Hypertension   . Hyperthyroidism   . Stroke [facility])     Past Surgical History:  Procedure Laterality Date  . ABDOMINAL AORTOGRAM W/LOWER EXTREMITY N/A 06/13/2017   Procedure: ABDOMINAL AORTOGRAM W/LOWER EXTREMITY;  Surgeon: Waynetta Sandy, MD;  Location: [place] CV [facility];  Service: Cardiovascular;  Laterality: N/A;  Bilateral  . ABDOMINAL HYSTERECTOMY    . BREAST LUMPECTOMY  1999  . BREAST LUMPECTOMY  left breast  . CAROTID ENDARTERECTOMY Left 04/2000  . CORONARY ARTERY BYPASS GRAFT  2001   x 5 (left internal mammary artery to left anterior  descending coronary artery  . LEFT HEART CATH AND CORS/GRAFTS ANGIOGRAPHY N/A 02/21/2018   Procedure: LEFT HEART CATH AND CORS/GRAFTS ANGIOGRAPHY;  Surgeon: Lorretta Harp, MD;  Location: [place] CV LAB;  Service: Cardiovascular;  Laterality: N/A;  . PERIPHERAL VASCULAR INTERVENTION  06/13/2017   Procedure: PERIPHERAL  VASCULAR INTERVENTION;  Surgeon: Waynetta Sandy, MD;  Location: [place] CV LAB;  Service: Cardiovascular;;  Bilateral Iliac Stents    Social History   Socioeconomic History  . Marital status: Single    Spouse name: Not on file  . Number of children: 1  . Years of education: Not on file  . Highest education level: Not on file  Occupational History  . 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 Financial resource strain: Not on file  . Food insecurity:    Worry: Not on file    Inability: Not on file  . Transportation needs:    Medical: Not on file    Non-medical: Not on file  Tobacco Use  . Smoking status: Former Smoker    Types: Cigarettes    Last attempt to quit: 09/11/2001    Years since quitting: 16.7  . Smokeless tobacco: Never Used  Substance and Sexual Activity  . Alcohol use: No  . Drug use: No  . Sexual activity: Not on file  Lifestyle  . Physical activity:    Days per week: Not on file    Minutes per session: Not on file  . Stress: Not on file  Relationships  . Social connections:    Talks on phone: Not on file    Gets together: Not on file    Attends religious service: Not on file    Active member of club or organization: Not on file    Attends meetings of clubs or organizations: Not on file    Relationship status: Not on file  . Intimate partner violence:    Fear of current or ex partner: Not on file    Emotionally abused: Not on file    Physically abused: Not on file    Forced sexual activity: Not on file  Other Topics Concern  . Not on file  Social History Narrative  . Not on file    Family History  Problem Relation Age of Onset  . Lung cancer Mother        died at an early age  . Other Unknown        There is no Hx of premature coronary artery disease in her family    ROS: no fevers or chills, productive cough, hemoptysis, dysphasia, odynophagia, melena, hematochezia, dysuria, hematuria,  rash, seizure activity, orthopnea, PND, pedal edema, claudication. Remaining systems are negative.  Physical Exam: Well-developed well-nourished in no acute distress.  Skin is warm and dry.  HEENT is normal.  Neck is supple.  Chest is clear to auscultation with normal expansion.  Cardiovascular exam is regular rate and rhythm.  Abdominal exam nontender or distended. No masses palpated. Extremities show no edema. neuro grossly intact  ECG- personally reviewed  A/P  1  Kirk Ruths, MD

## 2018-06-26 ENCOUNTER — Other Ambulatory Visit: Payer: Self-pay | Admitting: Cardiology

## 2018-06-26 NOTE — Telephone Encounter (Signed)
Rx request sent to pharmacy.  

## 2018-06-28 ENCOUNTER — Ambulatory Visit: Payer: Medicare Other | Admitting: Oncology

## 2018-07-01 ENCOUNTER — Ambulatory Visit: Payer: Medicare Other | Admitting: Cardiology

## 2018-07-04 ENCOUNTER — Ambulatory Visit
Admission: RE | Admit: 2018-07-04 | Discharge: 2018-07-04 | Disposition: A | Discharge status: Home / Self Care | Payer: Medicare Other | Source: Ambulatory Visit | Attending: General Surgery | Admitting: General Surgery

## 2018-07-04 DIAGNOSIS — D490 Neoplasm of unspecified behavior of digestive system: Secondary | ICD-10-CM

## 2018-07-04 MED ORDER — GADOBENATE DIMEGLUMINE 529 MG/ML IV SOLN
14.0000 mL | Freq: Once | INTRAVENOUS | Status: AC | PRN
  Administered 2018-07-04: 14 mL via INTRAVENOUS

## 2018-07-12 ENCOUNTER — Inpatient Hospital Stay: Payer: Medicare Other | Attending: Oncology | Admitting: Oncology

## 2018-07-12 ENCOUNTER — Telehealth: Payer: Self-pay | Admitting: Oncology

## 2018-07-12 VITALS — BP 142/69 | HR 65 | Temp 98.7°F | Resp 18 | Ht 65.0 in | Wt 157.7 lb

## 2018-07-12 DIAGNOSIS — Z853 Personal history of malignant neoplasm of breast: Secondary | ICD-10-CM | POA: Diagnosis not present

## 2018-07-12 DIAGNOSIS — C50912 Malignant neoplasm of unspecified site of left female breast: Secondary | ICD-10-CM

## 2018-07-12 NOTE — Telephone Encounter (Signed)
Appts scheduled avs/calendar printed per 11/1 los °

## 2018-07-12 NOTE — Progress Notes (Signed)
  [place] OFFICE PROGRESS NOTE   Diagnosis: Breast cancer  INTERVAL HISTORY:   Brenda [name] returns as scheduled.  She feels well.  She is exercising.  No change over either breast.  She is due for a mammogram. She was admitted in June with chest pain.  She reports her medical regimen was changed.  No recurrent chest pain. She is followed by Dr. Barry Dienes for a cystic pancreas lesion.  A repeat MRI of the abdomen on 07/04/2018 revealed a stable benign-appearing cystic lesion in the tail the pancreas.  Objective:  Vital signs in last 24 hours:  Blood pressure (!) 142/69, pulse 65, temperature 98.7 F (37.1 C), temperature source Oral, resp. rate 18, height 5\' 5"  (1.651 m), weight 157 lb 11.2 oz (71.5 kg), SpO2 99 %.    HEENT: Neck without mass Lymphatics: No cervical, supraclavicular, or axillary nodes Resp: Lungs clear bilaterally Cardio: Regular rate and rhythm GI: No hepatomegaly, nontender Vascular: No leg edema Breast: Status post left lumpectomy.  No evidence for local tumor recurrence.  Bilateral breast without mass.    Medications: I have reviewed the patient's current medications.  Assessment/plan:  1.Stage I left-sided breast cancer diagnosed December 1999. 2. Cystic tail of pancreas lesion-followed by Dr. Barry Dienes      Disposition: Brenda [name] is in clinical remission from breast cancer.  She would like to continue follow-up at the Cancer center.  She will return for an office visit in 1 year.  She will be scheduled for a screening mammogram.  She continues follow-up with Dr. Barry Dienes for evaluation of the cystic pancreas lesion.  15 minutes were spent with the patient today.  The majority of the time was used for counseling and coordination of care.  Betsy Coder, MD  07/12/2018  10:31 AM

## 2018-07-15 IMAGING — DX DG ABDOMEN 2V
2 series · 2 of 2 positions shown · non-contrast
Comparison: CT 06/04/2017 .

CLINICAL DATA: Abdominal pain.

EXAM:
ABDOMEN - 2 VIEW

[abdomen erect]
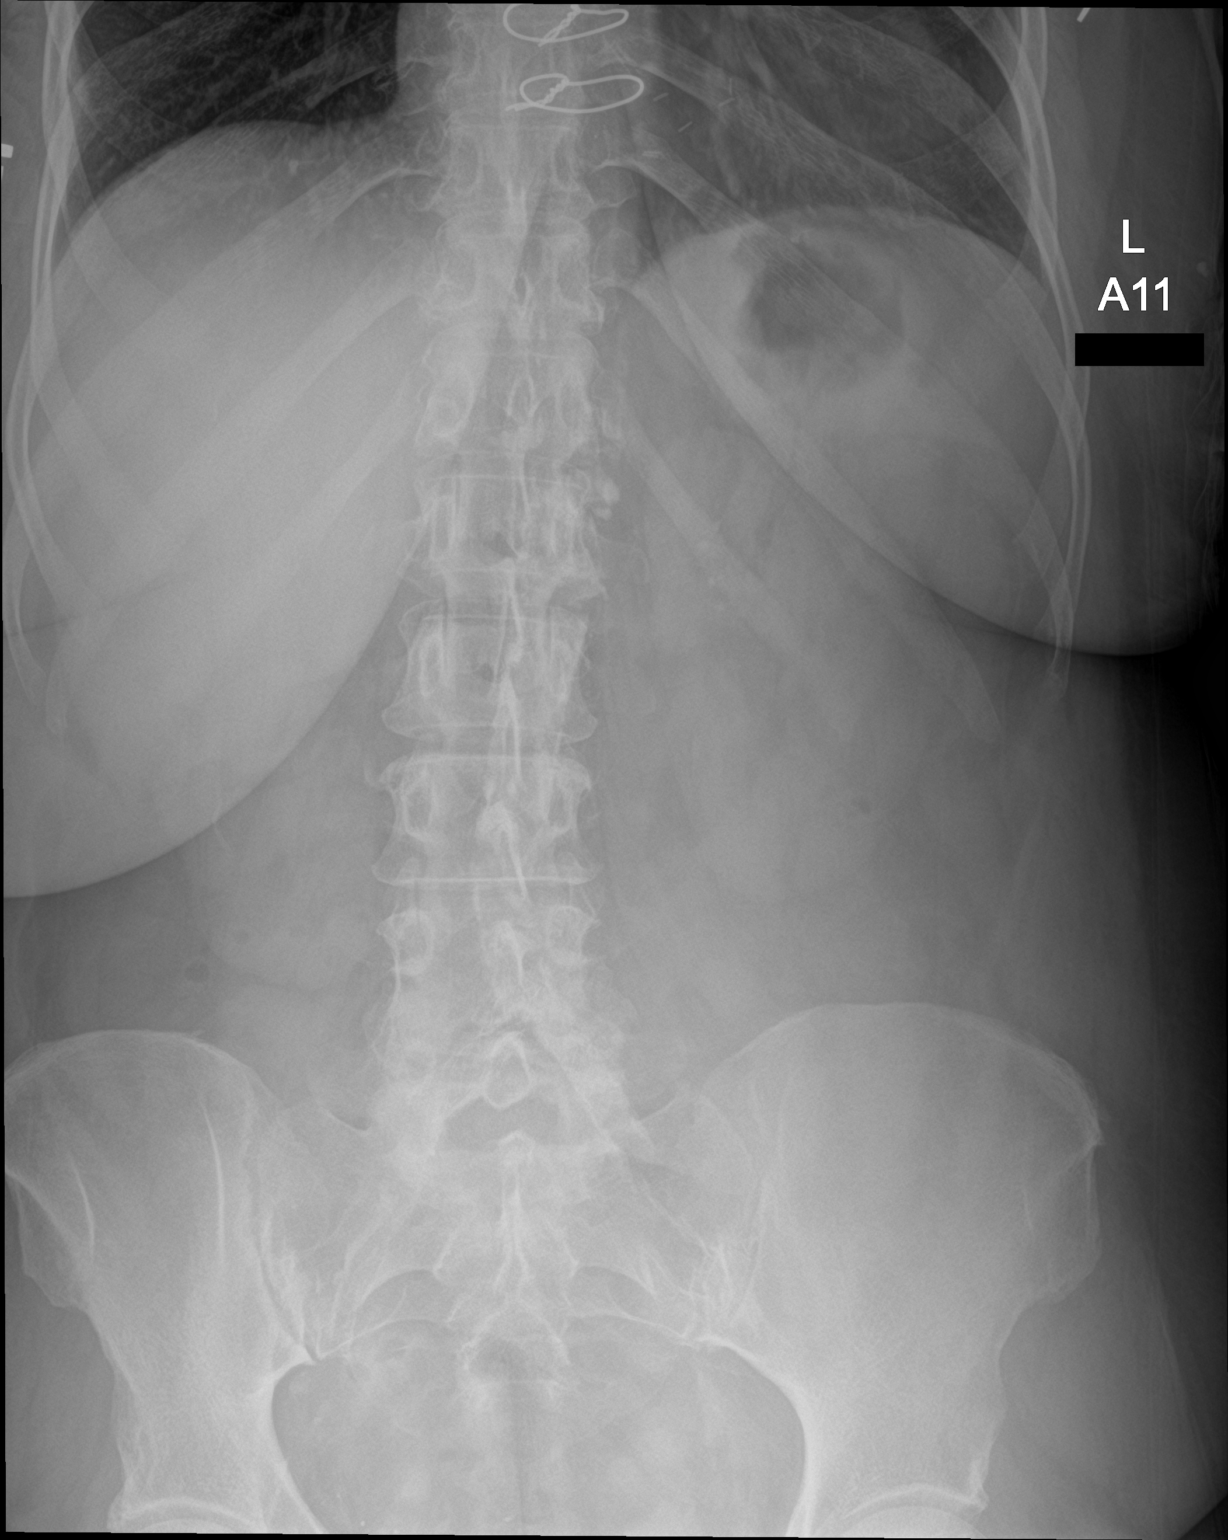

[abdomen supine]
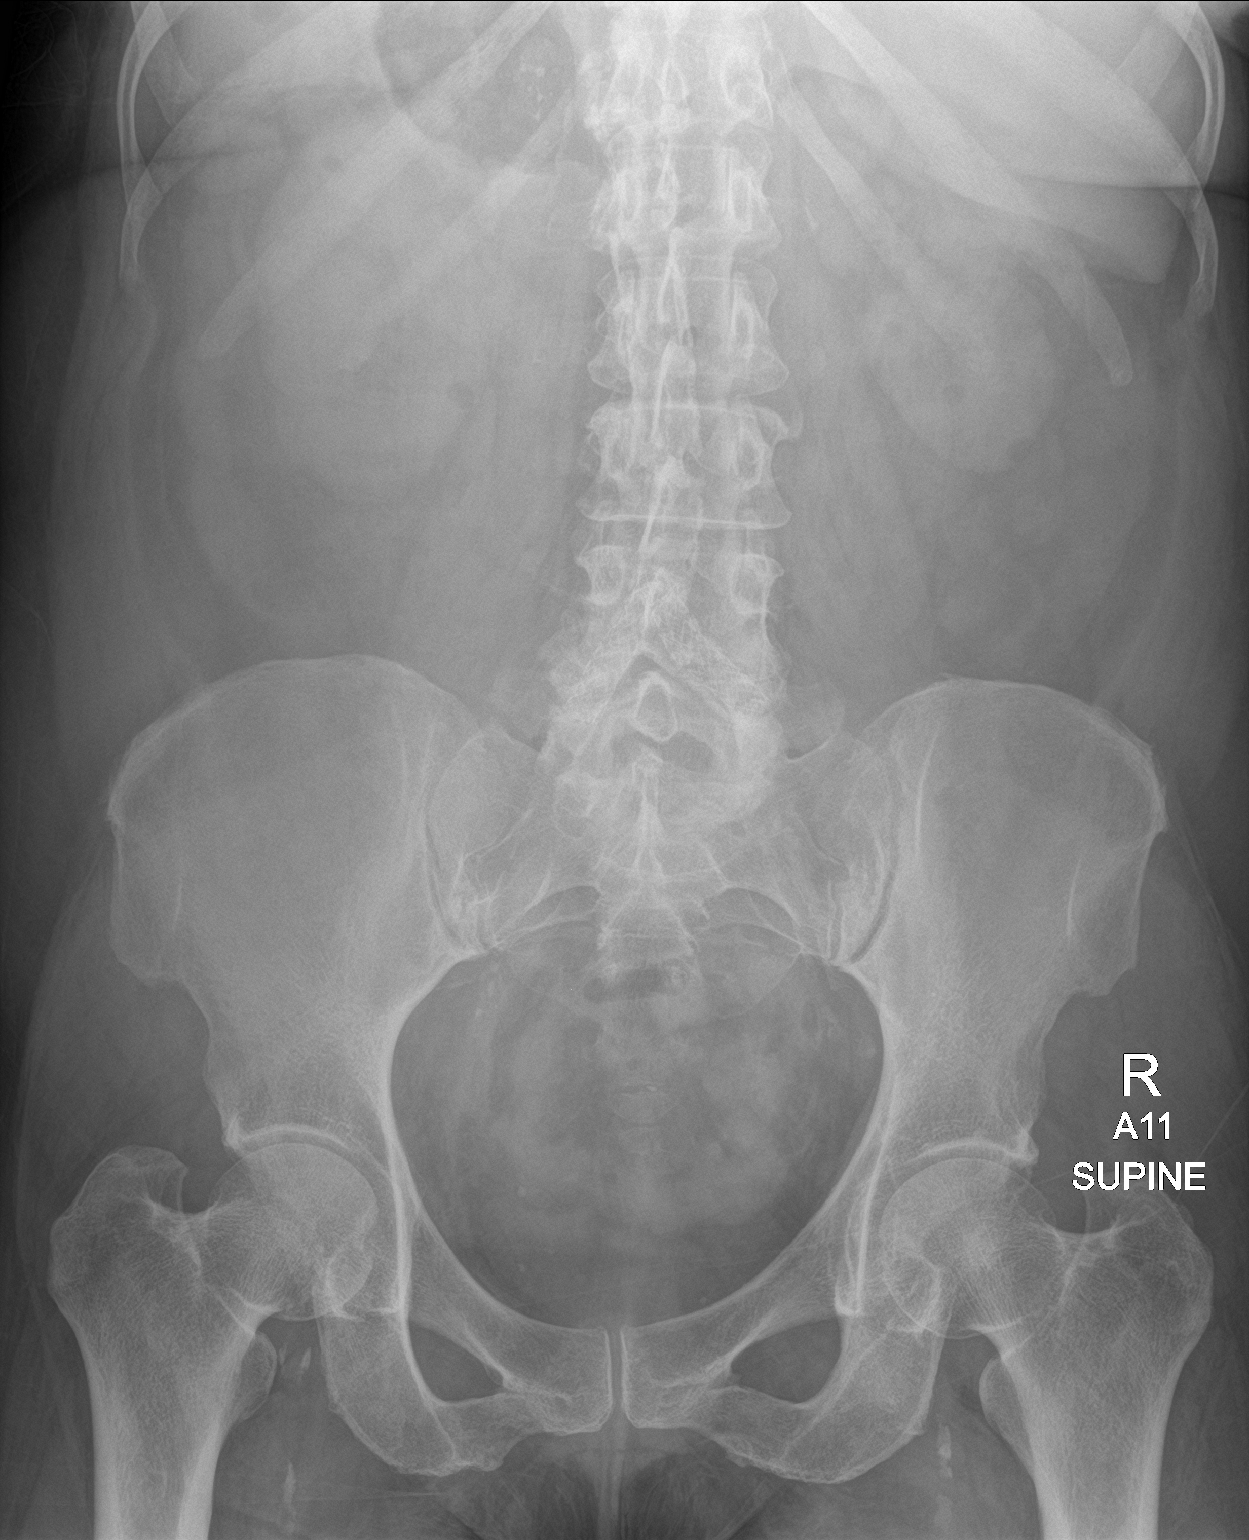

[2 of 2 positions shown; findings below may reference images not displayed]

FINDINGS: Soft tissue structures are unremarkable. No bowel distention or free
air. Aortoiliac atherosclerotic vascular calcification. Visceral
atherosclerotic vascular calcification. Bilateral iliac stents.
Prior median sternotomy.
IMPRESSION: 1. No acute intra-abdominal abnormality identified.

2. Aortoiliac and visceral atherosclerotic vascular disease.
Bilateral iliac stents.

## 2018-07-17 ENCOUNTER — Encounter

## 2018-07-17 ENCOUNTER — Other Ambulatory Visit: Payer: Self-pay | Admitting: Vascular Surgery

## 2018-07-17 NOTE — Progress Notes (Signed)
[name] [address], [place] [address] Phone: [phone] Subjective:   Brenda [name], am serving as a scribe for Dr. Hulan Saas.   CC: Foot pain, hip pain  HQP:[id number]  Brenda [name] is a 62 y.o. female coming in with complaint of left foot, 2nd toe, hammer toe. Intermittent pain in the toe. She is also having pain in the heel when she wakes up. Pain for one month, intermittently. Activity makes it feel better. Notes that pain radiates up into her hip.  Does not know if they are related.  Seems though that the foot pain usually starts before the hip pain.  [name] longer having the pain in the legs as much as she was having previously.  Recently seen her cardiologist as well as vascular all have given her a good bill of health at the moment     Past Medical History:  Diagnosis Date  . Cancer [facility])    breast cancer  . Carotid artery stenosis   . CHF (congestive heart failure) ([place])   . Coronary artery disease 2001  . Depression   . Hypercholesteremia   . Hypertension   . Hyperthyroidism   . Stroke [facility])    Past Surgical History:  Procedure Laterality Date  . ABDOMINAL AORTOGRAM W/LOWER EXTREMITY N/A 06/13/2017   Procedure: ABDOMINAL AORTOGRAM W/LOWER EXTREMITY;  Surgeon: Waynetta Sandy, MD;  Location: [place] CV LAB;  Service: Cardiovascular;  Laterality: N/A;  Bilateral  . ABDOMINAL HYSTERECTOMY    . BREAST LUMPECTOMY  1999  . BREAST LUMPECTOMY     left breast  . CAROTID ENDARTERECTOMY Left 04/2000  . CORONARY ARTERY BYPASS GRAFT  2001   x 5 (left internal mammary artery to left anterior  descending coronary artery  . LEFT HEART CATH AND CORS/GRAFTS ANGIOGRAPHY N/A 02/21/2018   Procedure: LEFT HEART CATH AND CORS/GRAFTS ANGIOGRAPHY;  Surgeon: Lorretta Harp, MD;  Location: [place] CV LAB;  Service: Cardiovascular;  Laterality: N/A;  . PERIPHERAL VASCULAR INTERVENTION  06/13/2017   Procedure: PERIPHERAL VASCULAR  INTERVENTION;  Surgeon: Waynetta Sandy, MD;  Location: [place] CV LAB;  Service: Cardiovascular;;  Bilateral Iliac Stents   Social History   Socioeconomic History  . Marital status: Single    Spouse name: Not on file  . Number of children: 1  . Years of education: Not on file  . Highest education level: Not on file  Occupational History  . 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 Financial resource strain: Not on file  . Food insecurity:    Worry: Not on file    Inability: Not on file  . Transportation needs:    Medical: Not on file    Non-medical: Not on file  Tobacco Use  . Smoking status: Former Smoker    Types: Cigarettes    Last attempt to quit: 09/11/2001    Years since quitting: 16.8  . Smokeless tobacco: Never Used  Substance and Sexual Activity  . Alcohol use: [name]  . Drug use: [name]  . Sexual activity: Not on file  Lifestyle  . Physical activity:    Days per week: Not on file    Minutes per session: Not on file  . Stress: Not on file  Relationships  . Social connections:    Talks on phone: Not on file    Gets together: Not on file    Attends religious service: Not  on file    Active member of club or organization: Not on file    Attends meetings of clubs or organizations: Not on file    Relationship status: Not on file  Other Topics Concern  . Not on file  Social History Narrative  . Not on file   Allergies  Allergen Reactions  . Latex Other (See Comments)    Reaction not recalled   Family History  Problem Relation Age of Onset  . Lung cancer Mother        died at an early age  . Other Unknown        There is [name] Hx of premature coronary artery disease in her family     Current Outpatient Medications (Cardiovascular):  .  amLODipine (NORVASC) 10 MG tablet, TAKE 1 TABLET BY MOUTH ONCE DAILY .  isosorbide mononitrate (IMDUR) 30 MG 24 hr tablet, Take 1 tablet (30 mg total) by mouth daily. Marland Kitchen   lisinopril-hydrochlorothiazide (PRINZIDE,ZESTORETIC) 20-25 MG tablet, Take 1 tablet by mouth daily. .  metoprolol succinate (TOPROL-XL) 100 MG 24 hr tablet, Take 1 tablet (100 mg total) by mouth daily. Take with or immediately following a meal. .  nitroGLYCERIN (NITROSTAT) 0.4 MG SL tablet, 1 tablet under tongue every 5 minutes as needed for chest discomfort (max 2 Tab/day) .  rosuvastatin (CRESTOR) 20 MG tablet, Take 20 mg by mouth daily.   Current Outpatient Medications (Analgesics):  .  aspirin 81 MG tablet, Take 1 tablet (81 mg total) by mouth daily. With Food  Current Outpatient Medications (Hematological):  .  clopidogrel (PLAVIX) 75 MG tablet, TAKE 1 TABLET BY MOUTH ONCE DAILY  Current Outpatient Medications (Other):  Marland Kitchen  Carboxymethylcellulose Sodium (REFRESH PLUS OP), Place 1-2 drops into both eyes as needed (for dryness or irritation only when wearing contacts). .  Cholecalciferol (VITAMIN D-3) 1000 units CAPS, Take 2,000 Units by mouth daily. .  Multiple Vitamins-Calcium (ONE-A-DAY WOMENS PO), Take 1 tablet by mouth daily. Marland Kitchen  OVER THE COUNTER MEDICATION, Take 1 tablet by mouth See admin instructions. Unnamed Back Pain OTC medication: Take 1 tablet by mouth every six hours as needed for back pain .  Potassium Chloride ER 20 MEQ TBCR, Take 1 tablet daily on Monday Wednesdays Fridays and Sundays .  venlafaxine XR (EFFEXOR-XR) 37.5 MG 24 hr capsule, TAKE 1 CAPSULE BY MOUTH ONCE DAILY WITH BREAKFAST    Past medical history, social, surgical and family history all reviewed in electronic medical record.  [name] pertanent information unless stated regarding to the chief complaint.   Review of Systems:  [name] headache, visual changes, nausea, vomiting, diarrhea, constipation, dizziness, abdominal pain, skin rash, fevers, chills, night sweats, weight loss, swollen lymph nodes, body aches, joint swelling,  chest pain, shortness of breath, mood changes.  Positive muscle aches  Objective  Blood  pressure 120/76, pulse (!) 57, height 5\' 5"  (1.651 m), weight 159 lb (72.1 kg), SpO2 98 %.   General: [name] apparent distress alert and oriented x3 mood and affect normal, dressed appropriately.  HEENT: Pupils equal, extraocular movements intact  Respiratory: Patient's speak in full sentences and does not appear short of breath  Cardiovascular: [name] lower extremity edema, non tender, [name] erythema  Skin: Warm dry intact with [name] signs of infection or rash on extremities or on axial skeleton.  Abdomen: Soft nontender  Neuro: Cranial nerves II through XII are intact, neurovascularly intact in all extremities with 2+ DTRs and 1+ pulses.  Lymph: [name] lymphadenopathy of posterior or anterior cervical  chain or axillae bilaterally.  Gait normal with good balance and coordination.  MSK:  Non tender with full range of motion and good stability and symmetric strength and tone of shoulders, elbows, wrist, hip, knee bilaterally.  Ankle: Left [name] visible erythema or swelling. Range of motion is full in all directions. Strength is 5/5 in all directions. Stable lateral and medial ligaments; squeeze test and kleiger test unremarkable; Talar dome nontender; [name] pain at base of 5th MT; [name] tenderness over cuboid; [name] tenderness over N spot or navicular prominence [name] tenderness on posterior aspects of lateral and medial malleolus [name] sign of peroneal tendon subluxations or tenderness to palpation Negative tarsal tunnel tinel's Able to walk 4 steps. Foot exam though shows the patient does have severe breakdown of the transverse arch with bunion and bunionette formation.  Patient does have fibular deviation of the large toe.  Mild splaying between the first and second toes with patient having a hammertoe on the second toe.  Contralateral ankle also has breakdown of the transverse arch with bunion formation but not as much fibular deviation of the large toe appears to be neurovascular intact distally   [id number]; 15 additional  minutes spent for Therapeutic exercises as stated in above notes.  This included exercises focusing on stretching, strengthening, with significant focus on eccentric aspects.   Long term goals include an improvement in range of motion, strength, endurance as well as avoiding reinjury. Patient's frequency would include in 1-2 times a day, 3-5 times a week for a duration of 6-12 weeks. Exercises for the foot include:  Stretches to help lengthen the lower leg and plantar fascia areas Theraband exercises for the lower leg and ankle to help strengthen the surrounding area- dorsiflexion, plantarflexion, inversion, eversion Massage rolling on the plantar surface of the foot with a frozen bottle, tennis ball or golf ball Towel or marble pick-ups to strengthen the plantar surface of the foot Weight bearing exercises to increase balance and overall stability   Proper technique shown and discussed handout in great detail with ATC.  All questions were discussed and answered.      Impression and Recommendations:     This case required medical decision making of moderate complexity. The above documentation has been reviewed and is accurate and complete Brenda Pulley, DO       Note: This dictation was prepared with Dragon dictation along with smaller phrase technology. Any transcriptional errors that result from this process are unintentional.

## 2018-07-19 ENCOUNTER — Ambulatory Visit (INDEPENDENT_AMBULATORY_CARE_PROVIDER_SITE_OTHER): Payer: Medicare Other | Admitting: Family Medicine

## 2018-07-19 ENCOUNTER — Ambulatory Visit: Payer: Self-pay

## 2018-07-19 ENCOUNTER — Encounter: Payer: Self-pay | Admitting: Family Medicine

## 2018-07-19 VITALS — BP 120/76 | HR 57 | Ht 65.0 in | Wt 159.0 lb

## 2018-07-19 DIAGNOSIS — M216X2 Other acquired deformities of left foot: Secondary | ICD-10-CM | POA: Insufficient documentation

## 2018-07-19 DIAGNOSIS — M79672 Pain in left foot: Secondary | ICD-10-CM

## 2018-07-19 NOTE — Patient Instructions (Signed)
Good to see you  We will get you orthotics Try to avoid walking barefoot Good shoes with rigid bottom.  Jalene Mullet, [employer]rell or New balance greater then [employer] is another good brand for you  Do not lace the last eye on yoru shoes For the hip lets see how aligning the foot helps See me again in 6-8 weeks

## 2018-07-19 NOTE — Assessment & Plan Note (Signed)
Transverse.  Discussed with patient about icing regimen and home exercises, discussed which activities to doing which wants to avoid.  Encourage patient to continue to stay active though.  Patient due to the breakdown we will do a custom orthotic which we will order.  Due to patient's other vascularity issues we do not want patient to have significant difficulty with any more pain.  Follow-up again in 4 weeks

## 2018-07-28 NOTE — Progress Notes (Signed)
Procedure Note   Patient was fitted for a : standard, cushioned, semi-rigid orthotic. The orthotic was heated and afterward the patient patient seated position and molded The patient was positioned in subtalar neutral position and 10 degrees of ankle dorsiflexion in a weight bearing stance. After completion of molding, patient did have orthotic management The blank was ground to a stable position for weight bearing. Size: 8 Base: Carbon fiber Additional Posting and Padding: left and right medial 300/110 300/120 Transverse 200/50 The patient ambulated these, and they were very comfortable.

## 2018-07-29 ENCOUNTER — Ambulatory Visit (INDEPENDENT_AMBULATORY_CARE_PROVIDER_SITE_OTHER): Payer: Medicare Other | Admitting: Family Medicine

## 2018-07-29 DIAGNOSIS — M216X2 Other acquired deformities of left foot: Secondary | ICD-10-CM | POA: Diagnosis not present

## 2018-07-29 NOTE — Assessment & Plan Note (Signed)
Changes made.  Patient will be wearing the orthotic and increasing weight over the course of the time.  We discussed icing regimen and home exercise.  We discussed which activities to do.  Follow-up with me again in 4 weeks

## 2018-07-30 ENCOUNTER — Ambulatory Visit (INDEPENDENT_AMBULATORY_CARE_PROVIDER_SITE_OTHER): Payer: Medicare Other | Admitting: Internal Medicine

## 2018-07-30 ENCOUNTER — Encounter: Payer: Self-pay | Admitting: Internal Medicine

## 2018-07-30 DIAGNOSIS — J301 Allergic rhinitis due to pollen: Secondary | ICD-10-CM | POA: Diagnosis not present

## 2018-07-30 MED ORDER — MONTELUKAST SODIUM 10 MG PO TABS: 10.0000 mg | ORAL_TABLET | Freq: Every day | ORAL | 3 refills | Status: DC

## 2018-07-30 NOTE — Progress Notes (Signed)
   Subjective:    Patient ID: Brenda [name], female    DOB: 1955-12-11, 62 y.o.   MRN: [id number]  HPI The patient is a 62 YO female coming in for allergies. She is taking otc medications from [employer] [facility] and does not know what ones. Takes rarely and in the morning and evening sometimes. The allergies are a lot of nose drainage. Denies sinus pressure. Some eye itching and dripping. Denies fevers or chills. Was on allergy shots about 20 years ago or so and did well for some years after that. Now is having a bad year since summer. She is just tired of these symptoms all the time. Worse when she is outdoors for some time. She is doing leaves recently and this makes it worse.   Review of Systems  Constitutional: Negative.   HENT: Positive for congestion, postnasal drip and rhinorrhea. Negative for sinus pressure, sinus pain, sore throat, trouble swallowing and voice change.   Eyes: Positive for itching.  Respiratory: Positive for cough. Negative for chest tightness and shortness of breath.        Mild, night time mostly  Cardiovascular: Negative for chest pain, palpitations and leg swelling.  Gastrointestinal: Negative for abdominal distention, abdominal pain, constipation, diarrhea, nausea and vomiting.  Musculoskeletal: Negative.   Skin: Negative.   Neurological: Negative.   Psychiatric/Behavioral: Negative.       Objective:   Physical Exam  Constitutional: She is oriented to person, place, and time. She appears well-developed and well-nourished.  HENT:  Head: Normocephalic and atraumatic.  Oropharynx with redness and clear drainage, nose with swollen turbinates, TMs normal bilaterally  Eyes: EOM are normal.  Neck: Normal range of motion. No thyromegaly present.  Cardiovascular: Normal rate and regular rhythm.  Pulmonary/Chest: Effort normal and breath sounds normal. No respiratory distress. She has no wheezes. She has no rales.  Abdominal: Soft.  Lymphadenopathy:    She has no cervical  adenopathy.  Neurological: She is alert and oriented to person, place, and time.  Skin: Skin is warm and dry.   Vitals:   07/30/18 0808  BP: (!) 142/70  Pulse: (!) 57  Temp: 98 F (36.7 C)  TempSrc: Oral  SpO2: 98%  Weight: 155 lb (70.3 kg)  Height: 5\' 5"  (1.651 m)      Assessment & Plan:

## 2018-07-30 NOTE — Patient Instructions (Signed)
We have sent in singulair to take daily in the evening for allergies.   I would recommend also to take zyrtec (cetirizine) daily to help dry up the drainage.   Use vaseline on the nose to help it from drying out.

## 2018-07-30 NOTE — Assessment & Plan Note (Signed)
Rx for singulair and advised to start zyrtec daily as well. No indication for antibiotics or steroids today.

## 2018-08-01 NOTE — Progress Notes (Signed)
HPI:  FU coronary artery disease and cerebrovascular disease. Patient is status post coronary artery bypass graft in 2001 (LIMA to the LAD, sequential saphenous vein graft to the distal right coronary and PDA, sequential saphenous vein graft to the ramus and obtuse marginal). She also had carotid endarterectomy at that time as well. Renal Dopplers in January of 2012 showed normal renal arteries and no abdominal aortic aneurysm. Carotid Dopplers November 2018 showed 40 to 59% right and 1 to 39% left stenosis. Cardiac catheterization June 2019 showed normal LV function, 80% left main, occluded right coronary artery, 95% ramus and 100% obtuse marginal. Sequential saphenous vein graft to the PLA-PDA and LIMA to the LAD patent. Sequential saphenous vein graft to the obtuse marginal and ramus intermedius occluded. Medical therapy recommended. Echocardiogram June 2019 showed normal LV function, moderate diastolic dysfunction, mild mitral regurgitation and mild left atrial enlargement. Peripheral vascular disease followed by vascular surgery.  Since last seenthe patient denies any dyspnea on exertion, orthopnea, PND, pedal edema, palpitations, syncope or chest pain.   Current Outpatient Medications  Medication Sig Dispense Refill  . amLODipine (NORVASC) 10 MG tablet TAKE 1 TABLET BY MOUTH ONCE DAILY 90 tablet 0  . aspirin 81 MG tablet Take 1 tablet (81 mg total) by mouth daily. With Food 30 tablet 2  . Carboxymethylcellulose Sodium (REFRESH PLUS OP) Place 1-2 drops into both eyes as needed (for dryness or irritation only when wearing contacts).    . Cholecalciferol (VITAMIN D-3) 1000 units CAPS Take 2,000 Units by mouth daily.    . clopidogrel (PLAVIX) 75 MG tablet TAKE 1 TABLET BY MOUTH ONCE DAILY 90 tablet 3  . isosorbide mononitrate (IMDUR) 30 MG 24 hr tablet Take 1 tablet (30 mg total) by mouth daily. 30 tablet 5  . lisinopril-hydrochlorothiazide (PRINZIDE,ZESTORETIC) 20-25 MG tablet Take 1  tablet by mouth daily. 180 tablet 1  . metoprolol succinate (TOPROL-XL) 100 MG 24 hr tablet Take 1 tablet (100 mg total) by mouth daily. Take with or immediately following a meal. 90 tablet 3  . montelukast (SINGULAIR) 10 MG tablet Take 1 tablet (10 mg total) by mouth at bedtime. 90 tablet 3  . Multiple Vitamins-Calcium (ONE-A-DAY WOMENS PO) Take 1 tablet by mouth daily.    . nitroGLYCERIN (NITROSTAT) 0.4 MG SL tablet 1 tablet under tongue every 5 minutes as needed for chest discomfort (max 2 Tab/day) 25 tablet 1  . OVER THE COUNTER MEDICATION Take 1 tablet by mouth See admin instructions. Unnamed Back Pain OTC medication: Take 1 tablet by mouth every six hours as needed for back pain    . Potassium Chloride ER 20 MEQ TBCR Take 1 tablet daily on Monday Wednesdays Fridays and Sundays 30 tablet 6  . rosuvastatin (CRESTOR) 20 MG tablet Take 20 mg by mouth daily.  1  . venlafaxine XR (EFFEXOR-XR) 37.5 MG 24 hr capsule TAKE 1 CAPSULE BY MOUTH ONCE DAILY WITH BREAKFAST 90 capsule 1   No current facility-administered medications for this visit.      Past Medical History:  Diagnosis Date  . Cancer [facility])    breast cancer  . Carotid artery stenosis   . CHF (congestive heart failure) ([place])   . Coronary artery disease 2001  . Depression   . Hypercholesteremia   . Hypertension   . Hyperthyroidism   . Stroke [facility])     Past Surgical History:  Procedure Laterality Date  . ABDOMINAL AORTOGRAM W/LOWER EXTREMITY N/A 06/13/2017   Procedure: ABDOMINAL AORTOGRAM  W/LOWER EXTREMITY;  Surgeon: Waynetta Sandy, MD;  Location: [place] [facility];  Service: Cardiovascular;  Laterality: N/A;  Bilateral  . ABDOMINAL HYSTERECTOMY    . BREAST LUMPECTOMY  1999  . BREAST LUMPECTOMY     left breast  . CAROTID ENDARTERECTOMY Left 04/2000  . CORONARY ARTERY BYPASS GRAFT  2001   x 5 (left internal mammary artery to left anterior  descending coronary artery  . LEFT HEART CATH AND CORS/GRAFTS ANGIOGRAPHY N/A  02/21/2018   Procedure: LEFT HEART CATH AND CORS/GRAFTS ANGIOGRAPHY;  Surgeon: Lorretta Harp, MD;  Location: [place] [facility];  Service: Cardiovascular;  Laterality: N/A;  . PERIPHERAL VASCULAR INTERVENTION  06/13/2017   Procedure: PERIPHERAL VASCULAR INTERVENTION;  Surgeon: Waynetta Sandy, MD;  Location: [place] [facility];  Service: Cardiovascular;;  Bilateral Iliac Stents    Social History   Socioeconomic History  . Marital status: Single    Spouse name: Not on file  . Number of children: 1  . Years of education: Not on file  . Highest education level: Not on file  Occupational History  . 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 Financial resource strain: Not on file  . Food insecurity:    Worry: Not on file    Inability: Not on file  . Transportation needs:    Medical: Not on file    Non-medical: Not on file  Tobacco Use  . Smoking status: Former Smoker    Types: Cigarettes    Last attempt to quit: 09/11/2001    Years since quitting: 16.9  . Smokeless tobacco: Never Used  Substance and Sexual Activity  . Alcohol use: No  . Drug use: No  . Sexual activity: Not on file  Lifestyle  . Physical activity:    Days per week: Not on file    Minutes per session: Not on file  . Stress: Not on file  Relationships  . Social connections:    Talks on phone: Not on file    Gets together: Not on file    Attends religious service: Not on file    Active member of club or organization: Not on file    Attends meetings of clubs or organizations: Not on file    Relationship status: Not on file  . Intimate partner violence:    Fear of current or ex partner: Not on file    Emotionally abused: Not on file    Physically abused: Not on file    Forced sexual activity: Not on file  Other Topics Concern  . Not on file  Social History Narrative  . Not on file    Family History  Problem Relation Age of Onset  . Lung cancer  Mother        died at an early age  . Other Unknown        There is no Hx of premature coronary artery disease in her family    ROS: no fevers or chills, productive cough, hemoptysis, dysphasia, odynophagia, melena, hematochezia, dysuria, hematuria, rash, seizure activity, orthopnea, PND, pedal edema, claudication. Remaining systems are negative.  Physical Exam: Well-developed well-nourished in no acute distress.  Skin is warm and dry.  HEENT is normal.  Neck is supple. Bilateral bruits Chest is clear to auscultation with normal expansion.  Cardiovascular exam is regular rate and rhythm.  Abdominal exam nontender or distended. No masses palpated. Extremities show no edema. neuro grossly intact  A/P  1 coronary artery disease status post coronary artery bypass and graft-she appears to be doing well from a symptomatic standpoint.  Continue medical therapy with aspirin and statin.  Patient is on Plavix at direction of vascular surgery.  2 carotid artery disease-we will arrange follow-up carotid Dopplers.  3 hypertension-patient's blood pressure is elevated.  However she has not taken her spironolactone or lisinopril in 1 week.  She will resume and we will follow and adjust regimen as needed.  4 hyperlipidemia-continue statin.  Kirk Ruths, MD

## 2018-08-07 ENCOUNTER — Ambulatory Visit (HOSPITAL_COMMUNITY): Payer: Medicare Other

## 2018-08-12 ENCOUNTER — Ambulatory Visit
Admission: RE | Admit: 2018-08-12 | Discharge: 2018-08-12 | Disposition: A | Discharge status: Home / Self Care | Payer: Medicare Other | Source: Ambulatory Visit | Attending: Oncology | Admitting: Oncology

## 2018-08-12 DIAGNOSIS — C50912 Malignant neoplasm of unspecified site of left female breast: Secondary | ICD-10-CM

## 2018-08-13 ENCOUNTER — Encounter: Payer: Self-pay | Admitting: Cardiology

## 2018-08-13 ENCOUNTER — Ambulatory Visit (HOSPITAL_COMMUNITY)
Admission: RE | Admit: 2018-08-13 | Discharge: 2018-08-13 | Disposition: A | Discharge status: Home / Self Care | Payer: Medicare Other | Source: Ambulatory Visit | Attending: Cardiovascular Disease | Admitting: Cardiovascular Disease

## 2018-08-13 ENCOUNTER — Other Ambulatory Visit: Payer: Self-pay | Admitting: *Deleted

## 2018-08-13 ENCOUNTER — Ambulatory Visit (INDEPENDENT_AMBULATORY_CARE_PROVIDER_SITE_OTHER): Payer: Medicare Other | Admitting: Cardiology

## 2018-08-13 ENCOUNTER — Other Ambulatory Visit: Payer: Self-pay | Admitting: Oncology

## 2018-08-13 VITALS — BP 144/86 | HR 59 | Ht 65.0 in | Wt 159.0 lb

## 2018-08-13 DIAGNOSIS — I2510 Atherosclerotic heart disease of native coronary artery without angina pectoris: Secondary | ICD-10-CM

## 2018-08-13 DIAGNOSIS — I6529 Occlusion and stenosis of unspecified carotid artery: Secondary | ICD-10-CM | POA: Diagnosis present

## 2018-08-13 DIAGNOSIS — E7800 Pure hypercholesterolemia, unspecified: Secondary | ICD-10-CM

## 2018-08-13 DIAGNOSIS — I10 Essential (primary) hypertension: Secondary | ICD-10-CM

## 2018-08-13 DIAGNOSIS — R928 Other abnormal and inconclusive findings on diagnostic imaging of breast: Secondary | ICD-10-CM

## 2018-08-13 NOTE — Patient Instructions (Signed)
Medication Instructions:  NO CHANGE If you need a refill on your cardiac medications before your next appointment, please call your pharmacy.   Lab work: If you have labs (blood work) drawn today and your tests are completely normal, you will receive your results only by: . MyChart Message (if you have MyChart) OR . A paper copy in the mail If you have any lab test that is abnormal or we need to change your treatment, we will call you to review the results.  Follow-Up: At [facility], you and your health needs are our priority.  As part of our continuing mission to provide you with exceptional heart care, we have created designated Provider Care Teams.  These Care Teams include your primary Cardiologist (physician) and Advanced Practice Providers (APPs -  Physician Assistants and Nurse Practitioners) who all work together to provide you with the care you need, when you need it. You will need a follow up appointment in 12 months.  Please call our office 2 months in advance to schedule this appointment.  You may see Brian Crenshaw, MD or one of the following Advanced Practice Providers on your designated Care Team:   Luke Kilroy, PA-C Krista Kroeger, PA-C . Callie Goodrich, PA-C     

## 2018-08-14 ENCOUNTER — Other Ambulatory Visit: Payer: Self-pay | Admitting: *Deleted

## 2018-08-15 ENCOUNTER — Ambulatory Visit
Admission: RE | Admit: 2018-08-15 | Discharge: 2018-08-15 | Disposition: A | Discharge status: Home / Self Care | Payer: Medicare Other | Source: Ambulatory Visit | Attending: Oncology | Admitting: Oncology

## 2018-08-15 ENCOUNTER — Other Ambulatory Visit: Payer: Self-pay | Admitting: Oncology

## 2018-08-15 DIAGNOSIS — R921 Mammographic calcification found on diagnostic imaging of breast: Secondary | ICD-10-CM

## 2018-08-15 DIAGNOSIS — R928 Other abnormal and inconclusive findings on diagnostic imaging of breast: Secondary | ICD-10-CM

## 2018-08-19 ENCOUNTER — Ambulatory Visit
Admission: RE | Admit: 2018-08-19 | Discharge: 2018-08-19 | Disposition: A | Discharge status: Home / Self Care | Payer: Medicare Other | Source: Ambulatory Visit | Attending: Oncology | Admitting: Oncology

## 2018-08-19 ENCOUNTER — Other Ambulatory Visit: Payer: Self-pay | Admitting: Oncology

## 2018-08-19 DIAGNOSIS — R921 Mammographic calcification found on diagnostic imaging of breast: Secondary | ICD-10-CM

## 2018-08-26 ENCOUNTER — Encounter: Payer: Self-pay | Admitting: Oncology

## 2018-08-26 ENCOUNTER — Encounter: Payer: Self-pay | Admitting: Licensed Clinical Social Worker

## 2018-09-13 ENCOUNTER — Other Ambulatory Visit: Payer: Self-pay | Admitting: General Surgery

## 2018-09-13 ENCOUNTER — Telehealth: Payer: Self-pay

## 2018-09-13 DIAGNOSIS — Z170 Estrogen receptor positive status [ER+]: Secondary | ICD-10-CM

## 2018-09-13 DIAGNOSIS — C50412 Malignant neoplasm of upper-outer quadrant of left female breast: Secondary | ICD-10-CM

## 2018-09-13 NOTE — Telephone Encounter (Signed)
Primary Cardiologist: [facility] Pre-operative Risk Assessment    Request for surgical clearance:  1. What type of surgery is being performed?  Bilateral mastectomy   2. When is this surgery scheduled? TBD   3. What type of clearance is required (medical clearance vs. Pharmacy clearance to hold med vs. Both)? Both  4. Are there any medications that need to be held prior to surgery and how long? Plavix, Aspirin  5. Practice name and name of physician performing surgery? [facility]    6. What is your office phone number [phone]    7.   What is your office fax number [phone]  8.   Anesthesia type (None, local, MAC, general) ?  General   Brenda [name] 09/13/2018, 4:46 PM  _________________________________________________________________   (provider comments below)

## 2018-09-18 NOTE — Telephone Encounter (Signed)
   Primary Cardiologist: Kirk Ruths, MD  Chart reviewed as part of pre-operative protocol coverage. Patient was contacted 09/18/2018 in reference to pre-operative risk assessment for pending surgery as outlined below.  Brenda [name] was last seen on 08/13/18 by Dr. Stanford Breed.  Since that day, Brenda [name] has done well. She has hx of CABG with one graft occluded, 2 grafts patent. She also has hx of stroke. She exercises regularly walking 1.5 - 3 miles per day without exertional symptoms. She is able to easily achieve over 4 Mets of activity. She had recent isolated shortness of breath with Yoga, likely related to doing something that she is not used to doing.   According to the RCRI she is a class III risk due to hx of CAD and stroke, with a 6.6% risk of major cardiac event perioperatively.  Therefore, based on ACC/AHA guidelines, the patient would be at acceptable risk for the planned procedure without further cardiovascular testing.   Regarding Plavix, it is OK from a cardiac standpoint to hold Plavix as needed, but her Plavix is currently managed by Dr. Donzetta Matters of vascular surgery and he would need to be consulted about holding it.   I will route this recommendation to the requesting party via Epic fax function and remove from pre-op pool.  Please call with questions.  Daune Perch, NP 09/18/2018, 3:12 PM

## 2018-09-19 NOTE — Telephone Encounter (Signed)
Patient has a history of aortogram with iliac stenting in 2018 by Dr. Donzetta Matters. Per our protocols for surgery, it would be fine to hold Plavix for upcoming mastectomy by Dr. Barry Dienes. This was reviewed with Dr. Oneida Alar.

## 2018-09-25 ENCOUNTER — Telehealth: Payer: Self-pay | Admitting: *Deleted

## 2018-09-25 NOTE — Telephone Encounter (Signed)
This RN received VM from stating " I have been diagnosed with breast cancer for the 2nd time and need to schedule an appointment "  " I am one of the [employer] and was was seen by my surgeon - Dr Barry Dienes on 09/13/2018. "  " let me know "   Return number given as [phone].  Noted pt is under the care of Dr Benay Spice- this message will be forwarded to his nurse for review and appropriate follow up.

## 2018-09-30 ENCOUNTER — Telehealth: Payer: Self-pay | Admitting: *Deleted

## 2018-09-30 NOTE — Telephone Encounter (Addendum)
Called patient to inform her that Dr. Benay Spice said he will see her after her surgery for new diagnosis of DCIS. She is requesting to transfer all her oncology care to Dr. Jana Hakim, since she is used to working with them so closely. She does not have surgery scheduled yet, and is concerned she has not heard from Dr. Barry Dienes yet. Has her appointment scheduled with plastic surgeon for 10/07/18. Anxious to be seen by medical oncology asap.  Will forward her request to breast nurse navigators, since Dr. Benay Spice agrees with the transfer of care.

## 2018-10-01 ENCOUNTER — Encounter: Payer: Self-pay | Admitting: *Deleted

## 2018-10-01 NOTE — Progress Notes (Signed)
[facility] Intake Session  The pt presented to counseling with a calm mood and matching affect. The pt was alert and oriented. The pt was forthcoming and engaged throughout session.  The pt expressed that she is seeking counseling in order to be supported as she processes the recurrence of her cancer.The pt shared that she has been in counseling in the past, around the time of her first cancer diagnosis, and she found it helpful to speak to a person outside of her situation who refrains from offering advice. The pt reported that since she was diagnosed last month, she has felt overwhelmed and depressed. The pt denied any current or past suicidal ideation. She became tearful as she described how coming to [facility] today as a patient "made this real." The pt expressed feeling isolated, which she related to being "a helper," sharing that she has not told many friends about her recurrence because she does not want them to "lose hope." The pt also related this to uncertainty in terms of her diagnosis and treatment. The pt was referred to Bary Castilla to address information concerns.   The pt and counselor will meet again on Tuesday, January 28 at 9 am.  Doris Cheadle, Counseling Intern [phone]

## 2018-10-03 ENCOUNTER — Telehealth: Payer: Self-pay | Admitting: Oncology

## 2018-10-03 NOTE — Telephone Encounter (Signed)
A new patient breast appt has been scheduled to see Dr. Jana Hakim on 1/28 at 4pm. Pt aware to arrive 30 minutes early.

## 2018-10-07 ENCOUNTER — Telehealth: Payer: Self-pay | Admitting: *Deleted

## 2018-10-07 NOTE — Progress Notes (Addendum)
Brenda [name]  [phone] [phone]    ID: Brenda [name] DOB: 1955/09/27  MR#: [id number]  QQV#:[id number]  Patient Care Team: Brenda Koch, MD as PCP - General (Internal Medicine) Brenda Bowen, MD as Consulting Physician (Endocrinology) Brenda Klein, MD as Consulting Physician (General Surgery) Magrinat, Virgie Dad, MD as Consulting Physician (Oncology) Brenda Breed Denice Bors, MD as Consulting Physician (Cardiology) Brenda Limbo, MD as Consulting Physician (Plastic Surgery) Brenda Craver, MD as Consulting Physician (Gastroenterology) OTHER MD:   CHIEF COMPLAINT: Ductal carcinoma in situ, estrogen receptor positive   CURRENT TREATMENT: Awaiting definitive surgery   HISTORY OF CURRENT ILLNESS: Brenda [name] has a remote history of breast cancer, and she underwent a lumpectomy of the left breast in 1999.  She does not remember the size of the tumor but knows that 8 lymph nodes were removed and that they were all clear.  She was treated with chemotherapy and radiation.  She does not recall other details.  She did not receive antiestrogens  More recently, on 08/12/2018 Brenda [name] had routine screening mammography showing a possible abnormality in the left breast. She underwent unilateral left diagnostic mammography with tomography at [facility] on 08/15/2018 showing: Breast Density Category B. Spot compression magnification views were performed over the upper outer far posterior left breast. There are grouped microcalcifications in the region of the lumpectomy site in the far upper outer posterior left breast varying in shape, size, and density, spanning approximately 2.7 cm.   Accordingly on 08/19/2018 she proceeded to biopsy of the left breast area in question. The pathology from this procedure showed ([id number]): ductal carcinoma in situ, intermediate nuclear grade with calcifications. Prognostic indicators significant for: estrogen receptor, 100%  positive and progesterone receptor, 90% positive, both with strong staining intensity.   The patient's subsequent history is as detailed below.   INTERVAL HISTORY: Brenda [name] was evaluated in the breast cancer clinic on 10/08/2018.   Her case was also presented at the multidisciplinary breast cancer conference on 08/28/2018. At that time a preliminary plan was proposed: Since she already had radiation to that breast, mastectomy was proposed; if she had mastectomy and there was no invasive disease and no further treatment would be needed necessary   She is scheduled for bilateral mastectomies on 11/07/2018 with Dr. Barry Dienes.    REVIEW OF SYSTEMS: Brenda [name] denies unusual headaches, visual changes, nausea, vomiting, stiff neck, dizziness, or gait imbalance. There has been no cough, phlegm production, or pleurisy, no chest pain or pressure, and no change in bowel or bladder habits. The patient denies fever, rash, bleeding, unexplained fatigue or unexplained weight loss. A detailed review of systems was otherwise entirely negative.   PAST MEDICAL HISTORY: Past Medical History:  Diagnosis Date  . Cancer [facility])    breast cancer  . Carotid artery stenosis   . CHF (congestive heart failure) (Brenda [name])   . Coronary artery disease 2001  . Depression   . Family history of lung cancer   . Hypercholesteremia   . Hypertension   . Hyperthyroidism   . Stroke [name])      PAST SURGICAL HISTORY: Past Surgical History:  Procedure Laterality Date  . ABDOMINAL AORTOGRAM W/LOWER EXTREMITY N/A 06/13/2017   Procedure: ABDOMINAL AORTOGRAM W/LOWER EXTREMITY;  Surgeon: Brenda Sandy, MD;  Location: [name];  Service: Cardiovascular;  Laterality: N/A;  Bilateral  . ABDOMINAL HYSTERECTOMY    . BREAST LUMPECTOMY  1999  . BREAST LUMPECTOMY     left breast  . CAROTID ENDARTERECTOMY  Left 04/2000  . CORONARY ARTERY BYPASS GRAFT  2001   x 5 (left internal mammary artery to left anterior  descending  coronary artery  . LEFT HEART CATH AND CORS/GRAFTS ANGIOGRAPHY N/A 02/21/2018   Procedure: LEFT HEART CATH AND CORS/GRAFTS ANGIOGRAPHY;  Surgeon: Lorretta Harp, MD;  Location: [name];  Service: Cardiovascular;  Laterality: N/A;  . PERIPHERAL VASCULAR INTERVENTION  06/13/2017   Procedure: PERIPHERAL VASCULAR INTERVENTION;  Surgeon: Brenda Sandy, MD;  Location: [facility];  Service: Cardiovascular;;  Bilateral Iliac Stents     FAMILY HISTORY: Family History  Problem Relation Age of Onset  . Lung cancer Mother 42       died at an early age  . Other Other        There is no Hx of premature coronary artery disease in her family   [name] has no information on her father.  The patient's mother died when she was 32 years old and the patient was put in foster care and partly raised by great aunts.  The patient has 1 sister, but she does not know her sister's medical history.  GYNECOLOGIC HISTORY:  No LMP recorded. Patient has had a hysterectomy. Menarche age 71, first live birth age 22, the patient is GX P1.  She had hysterectomy around age 16.  She did not receive hormone replacement.  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  Her daughter Brenda [name] works for the [employer] but currently is under [employer] because of an injury inflicted by a Ship broker.  The patient has 2 granddaughters, age 55 and 77 as of January 2019.  At home is just the patient.  ADVANCED DIRECTIVES: Not in place   HEALTH MAINTENANCE: Social History   Tobacco Use  . Smoking status: Former Smoker    Types: Cigarettes    Last attempt to quit: 09/11/2001    Years since quitting: 17.0  . Smokeless tobacco: Never Used  Substance Use Topics  . Alcohol use: No  . Drug use: No    Colonoscopy:  January 2012  PAP: Status post hysterectomy  B[facility] density: May [facility], T score -0.5   Allergies  Allergen Reactions  . Latex Other (See Comments)    Reaction not recalled    Current Outpatient Medications  Medication Sig Dispense Refill  . amLODipine (NORVASC) 10 MG tablet TAKE 1 TABLET BY MOUTH ONCE DAILY 90 tablet 0  . aspirin 81 MG tablet Take 1 tablet (81 mg total) by mouth daily. With Food 30 tablet 2  . Carboxymethylcellulose Sodium (REFRESH PLUS OP) Place 1-2 drops into both eyes as needed (for dryness or irritation only when wearing contacts).    . Cholecalciferol (VITAMIN D-3) 1000 units CAPS Take 2,000 Units by mouth daily.    . clopidogrel (PLAVIX) 75 MG tablet TAKE 1 TABLET BY MOUTH ONCE DAILY 90 tablet 3  . isosorbide mononitrate (IMDUR) 30 MG 24 hr tablet Take 1 tablet (30 mg total) by mouth daily. 30 tablet 5  . lisinopril-hydrochlorothiazide (PRINZIDE,ZESTORETIC) 20-25 MG tablet Take 1 tablet by mouth daily. 180 tablet 1  . metoprolol succinate (TOPROL-XL) 100 MG 24 hr tablet Take 1 tablet (100 mg total) by mouth daily. Take with or immediately following a meal. 90 tablet 3  . montelukast (  SINGULAIR) 10 MG tablet Take 1 tablet (10 mg total) by mouth at bedtime. 90 tablet 3  . Multiple Vitamins-Calcium (ONE-A-DAY WOMENS PO) Take 1 tablet by mouth daily.    . nitroGLYCERIN (NITROSTAT) 0.4 MG SL tablet 1 tablet under tongue every 5 minutes as needed for chest discomfort (max 2 Tab/day) 25 tablet 1  . OVER THE COUNTER MEDICATION Take 1 tablet by mouth See admin instructions. Unnamed Back Pain OTC medication: Take 1 tablet by mouth every six hours as needed for back pain    . Potassium Chloride ER 20 MEQ TBCR Take 1 tablet daily on Monday Wednesdays Fridays and Sundays 30 tablet 6  . rosuvastatin (CRESTOR) 20 MG tablet Take 20 mg by mouth daily.  1  . venlafaxine XR (EFFEXOR-XR) 37.5 MG 24 hr capsule TAKE 1 CAPSULE BY MOUTH ONCE DAILY WITH BREAKFAST 90 capsule 1   No  current facility-administered medications for this visit.      OBJECTIVE: Middle-aged African-American woman in no acute distress  Weight 158 pounds,  blood pressure 117/65, 100% oxygenation on room air, heart rate 71,  Vitals entered manually because computer was down at the time of visit)  There were no vitals filed for this visit.   There is no height or weight on file to calculate BMI.   Wt Readings from Last 3 Encounters:  08/13/18 159 lb (72.1 kg)  07/30/18 155 lb (70.3 kg)  07/19/18 159 lb (72.1 kg)      ECOG FS:0 - Asymptomatic  Ocular: Sclerae unicteric, pupils round and equal Lymphatic: No cervical or supraclavicular adenopathy Lungs no rales or rhonchi Heart regular rate and rhythm Abd soft, nontender, positive bowel sounds MSK no focal spinal tenderness, no joint edema Neuro: Entirely non-focal, well-oriented, normal speech, appropriate affect Breasts: The right breast is unremarkable.  The left breast is status post remote lumpectomy and radiation.  There is no palpable mass.  There is no skin or nipple change of concern.  Both axillae are benign.   LAB RESULTS:  CMP     Component Value Date/Time   NA 139 10/08/2018 1109   NA 141 02/28/2017 1131   K 4.5 10/08/2018 1109   CL 104 10/08/2018 1109   CO2 26 10/08/2018 1109   GLUCOSE 93 10/08/2018 1109   BUN 19 10/08/2018 1109   BUN 13 02/28/2017 1131   CREATININE 1.14 (H) 10/08/2018 1109   CREATININE 1.04 (H) 01/31/2017 0915   CALCIUM 10.4 (H) 10/08/2018 1109   PROT 8.4 (H) 10/08/2018 1109   ALBUMIN 4.1 10/08/2018 1109   AST 22 10/08/2018 1109   ALT 22 10/08/2018 1109   ALKPHOS 65 10/08/2018 1109   BILITOT 0.9 10/08/2018 1109   GFRNONAA 51 (L) 10/08/2018 1109   GFRAA 59 (L) 10/08/2018 1109    No results found for: TOTALPROTELP, ALBUMINELP, A1GS, A2GS, BETS, BETA2SER, GAMS, MSPIKE, SPEI  No results found for: KPAFRELGTCHN, LAMBDASER, KAPLAMBRATIO  Lab Results  Component Value Date   WBC 8.6  10/08/2018   NEUTROABS 5.6 10/08/2018   HGB 13.4 10/08/2018   HCT 41.2 10/08/2018   MCV 94.9 10/08/2018   PLT 228 10/08/2018    @LASTCHEMISTRY @  No results found for: LABCA2  No components found for: [id number]  No results for input(s): INR in the last 168 hours.  No results found for: LABCA2  No results found for: NWG956  No results found for: OZH086  No results found for: VHQ469  No results found for: CA2729  No components found for:  HGQUANT  No results found for: CEA1 / No results found for: CEA1   No results found for: AFPTUMOR  No results found for: [place]  No results found for: PSA1  Appointment on 10/08/2018  Component Date Value Ref Range Status  . Sodium 10/08/2018 139  135 - 145 mmol/L Final  . Potassium 10/08/2018 4.5  3.5 - 5.1 mmol/L Final  . Chloride 10/08/2018 104  98 - 111 mmol/L Final  . CO2 10/08/2018 26  22 - 32 mmol/L Final  . Glucose, Bld 10/08/2018 93  70 - 99 mg/dL Final  . BUN 10/08/2018 19  8 - 23 mg/dL Final  . Creatinine 10/08/2018 1.14* 0.44 - 1.00 mg/dL Final  . Calcium 10/08/2018 10.4* 8.9 - 10.3 mg/dL Final  . Total Protein 10/08/2018 8.4* 6.5 - 8.1 g/dL Final  . Albumin 10/08/2018 4.1  3.5 - 5.0 g/dL Final  . AST 10/08/2018 22  15 - 41 U/L Final  . ALT 10/08/2018 22  0 - 44 U/L Final  . Alkaline Phosphatase 10/08/2018 65  38 - 126 U/L Final  . Total Bilirubin 10/08/2018 0.9  0.3 - 1.2 mg/dL Final  . GFR, Est Non Af Am 10/08/2018 51* >60 mL/min Final  . GFR, Est AFR Am 10/08/2018 59* >60 mL/min Final  . Anion gap 10/08/2018 9  5 - 15 Final   Performed at [facility], [place] [address], [place], [place] [address]  . WBC Count 10/08/2018 8.6  4.0 - 10.5 K/uL Final  . RBC 10/08/2018 4.34  3.87 - 5.11 MIL/uL Final  . Hemoglobin 10/08/2018 13.4  12.0 - 15.0 g/dL Final  . HCT 10/08/2018 41.2  36.0 - 46.0 % Final  . MCV 10/08/2018 94.9  80.0 - 100.0 fL Final  . MCH 10/08/2018 30.9  26.0 - 34.0 pg Final  .  MCHC 10/08/2018 32.5  30.0 - 36.0 g/dL Final  . RDW 10/08/2018 12.6  11.5 - 15.5 % Final  . Platelet Count 10/08/2018 228  150 - 400 K/uL Final  . nRBC 10/08/2018 0.0  0.0 - 0.2 % Final  . Neutrophils Relative % 10/08/2018 65  % Final  . Neutro Abs 10/08/2018 5.6  1.7 - 7.7 K/uL Final  . Lymphocytes Relative 10/08/2018 26  % Final  . Lymphs Abs 10/08/2018 2.3  0.7 - 4.0 K/uL Final  . Monocytes Relative 10/08/2018 7  % Final  . Monocytes Absolute 10/08/2018 0.6  0.1 - 1.0 K/uL Final  . Eosinophils Relative 10/08/2018 1  % Final  . Eosinophils Absolute 10/08/2018 0.1  0.0 - 0.5 K/uL Final  . Basophils Relative 10/08/2018 1  % Final  . Basophils Absolute 10/08/2018 0.0  0.0 - 0.1 K/uL Final  . Immature Granulocytes 10/08/2018 0  % Final  . Abs Immature Granulocytes 10/08/2018 0.03  0.00 - 0.07 K/uL Final   Performed at [facility], [place] [address], [place], [place] [address]    (this displays the last labs from the last 3 days)  No results found for: TOTALPROTELP, ALBUMINELP, A1GS, A2GS, BETS, BETA2SER, GAMS, MSPIKE, SPEI (this displays SPEP labs)  No results found for: KPAFRELGTCHN, LAMBDASER, KAPLAMBRATIO (kappa/lambda light chains)  No results found for: HGBA, HGBA2QUANT, HGBFQUANT, HGBSQUAN (Hemoglobinopathy evaluation)   No results found for: LDH  Lab Results  Component Value Date   IRON 81 05/10/2017   IRONPCTSAT 24.3 05/10/2017   (Iron and TIBC)  No results found for: FERRITIN  Urinalysis No results found for: COLORURINE,  APPEARANCEUR, LABSPEC, PHURINE, GLUCOSEU, HGBUR, BILIRUBINUR, KETONESUR, PROTEINUR, UROBILINOGEN, NITRITE, LEUKOCYTESUR   STUDIES:  No results found.   ELIGIBLE FOR AVAILABLE RESEARCH PROTOCOL: no   ASSESSMENT: 63 y.o. Brenda [name] woman status post left breast biopsy 08/19/2018 for ductal carcinoma in situ, grade 2, estrogen and progesterone receptor positive  (1) left lumpectomy 1999 for an ?estrogen receptor negative  stage I-II invasive breast cancer  (a) s/p chemotherapy  (b) s/p radiation  (2) genetics testing 10/08/2018  (3) definitive surgery pending  ( PLAN: I spent approximately 60 minutes face to face with Brenda [name] with more than 50% of that time spent in counseling and coordination of care. Specifically we reviewed the biology of the patient's diagnosis and the specifics of her situation.  Brenda [name] understands that in noninvasive ductal carcinoma, also called ductal carcinoma in situ ("DCIS") the breast cancer cells remain trapped in the ducts were they started. They cannot travel to a vital organ. For that reason these cancers in themselves are not life-threatening.  If the whole breast is removed then all the ducts are removed and since the cancer cells are trapped in the ducts, the cure rate with mastectomy for noninvasive breast cancer is approximately 99%. Nevertheless we generally recommend lumpectomy, because there is no survival advantage to mastectomy.  However in Brenda [name]'s case, since she already had radiation to the breast, mastectomy is the standard of care.  Otherwise the risk of local recurrence would be higher.  The patient is considering bilateral mastectomies and I think this is the correct option for her.  It is not clear whether she can be reconstructed and if not she would be extremely asymmetrical.  This is not only psychologically difficult but physically deleterious as well.  Accordingly I support her decision for bilateral mastectomies  She also meets criteria for genetics testing. In patients who carry a deleterious mutation bilateral mastectomies is a common option.  Of course, if there is a deleterious mutation bilateral oophorectomy may be necessary as there is no standard screening protocol for ovarian cancer.  We also discussed reconstruction options.  Given her severe peripheral vascular disease and cardiac problems I do not think any kind of flap would be a good idea.  I think  she could consider implant reconstruction at some point but what we would not want is for her to have a stroke or an embolus which might be disabling for her and given her severe cardiac and vascular problems less may be better.    I have encouraged her to see some photos of reconstructed breasts on Dr. Para Skeans website.  Brenda [name] knows she can always proceed to reconstruction at a later time if she decides to go that route.  At this point she has a good understanding of the overall plan. She agrees with it. She knows the goal of treatment in her case is cure. She will call with any problems that may develop before her next visit here.   Magrinat, Virgie Dad, MD  10/08/18 5:01 PM Medical Oncology and Hematology [facility] [address] [place], [place] [address] Tel. [phone]    Fax. [phone]    I, Jacqualyn Posey am acting as a Education administrator for Chauncey Cruel, MD.   I, Lurline Del MD, have reviewed the above documentation for accuracy and completeness, and I agree with the above.

## 2018-10-07 NOTE — Telephone Encounter (Signed)
Spoke to pt regarding genetic counseling and emotional support. Scheduled and confirmed appt on 10/08/18 at 11am. Encourage pt to see Janett Billow for counseling and emotional support. Denies further needs at this time.

## 2018-10-08 ENCOUNTER — Encounter: Payer: Self-pay | Admitting: Genetics

## 2018-10-08 ENCOUNTER — Inpatient Hospital Stay: Payer: Medicare Other | Attending: Oncology | Admitting: Oncology

## 2018-10-08 ENCOUNTER — Inpatient Hospital Stay (HOSPITAL_BASED_OUTPATIENT_CLINIC_OR_DEPARTMENT_OTHER): Payer: Medicare Other | Admitting: Genetics

## 2018-10-08 ENCOUNTER — Inpatient Hospital Stay: Payer: Medicare Other

## 2018-10-08 ENCOUNTER — Other Ambulatory Visit: Payer: Self-pay

## 2018-10-08 ENCOUNTER — Other Ambulatory Visit: Payer: Medicare Other

## 2018-10-08 DIAGNOSIS — C50912 Malignant neoplasm of unspecified site of left female breast: Secondary | ICD-10-CM

## 2018-10-08 DIAGNOSIS — I110 Hypertensive heart disease with heart failure: Secondary | ICD-10-CM | POA: Diagnosis not present

## 2018-10-08 DIAGNOSIS — F329 Major depressive disorder, single episode, unspecified: Secondary | ICD-10-CM

## 2018-10-08 DIAGNOSIS — Z87891 Personal history of nicotine dependence: Secondary | ICD-10-CM

## 2018-10-08 DIAGNOSIS — Z923 Personal history of irradiation: Secondary | ICD-10-CM

## 2018-10-08 DIAGNOSIS — Z170 Estrogen receptor positive status [ER+]: Secondary | ICD-10-CM | POA: Diagnosis not present

## 2018-10-08 DIAGNOSIS — I2510 Atherosclerotic heart disease of native coronary artery without angina pectoris: Secondary | ICD-10-CM

## 2018-10-08 DIAGNOSIS — Z9221 Personal history of antineoplastic chemotherapy: Secondary | ICD-10-CM

## 2018-10-08 DIAGNOSIS — D0512 Intraductal carcinoma in situ of left breast: Secondary | ICD-10-CM

## 2018-10-08 DIAGNOSIS — Z801 Family history of malignant neoplasm of trachea, bronchus and lung: Secondary | ICD-10-CM

## 2018-10-08 DIAGNOSIS — C50112 Malignant neoplasm of central portion of left female breast: Secondary | ICD-10-CM | POA: Insufficient documentation

## 2018-10-08 DIAGNOSIS — Z853 Personal history of malignant neoplasm of breast: Secondary | ICD-10-CM

## 2018-10-08 DIAGNOSIS — Z171 Estrogen receptor negative status [ER-]: Secondary | ICD-10-CM | POA: Insufficient documentation

## 2018-10-08 LAB — CBC WITH DIFFERENTIAL (CANCER CENTER ONLY)
Abs Immature Granulocytes: 0.03 10*3/uL (ref 0.00–0.07)
Basophils Absolute: 0 10*3/uL (ref 0.0–0.1)
Basophils Relative: 1 %
Eosinophils Absolute: 0.1 10*3/uL (ref 0.0–0.5)
Eosinophils Relative: 1 %
HCT: 41.2 % (ref 36.0–46.0)
Hemoglobin: 13.4 g/dL (ref 12.0–15.0)
Immature Granulocytes: 0 %
Lymphocytes Relative: 26 %
Lymphs Abs: 2.3 10*3/uL (ref 0.7–4.0)
MCH: 30.9 pg (ref 26.0–34.0)
MCHC: 32.5 g/dL (ref 30.0–36.0)
MCV: 94.9 fL (ref 80.0–100.0)
Monocytes Absolute: 0.6 10*3/uL (ref 0.1–1.0)
Monocytes Relative: 7 %
Neutro Abs: 5.6 10*3/uL (ref 1.7–7.7)
Neutrophils Relative %: 65 %
Platelet Count: 228 10*3/uL (ref 150–400)
RBC: 4.34 MIL/uL (ref 3.87–5.11)
RDW: 12.6 % (ref 11.5–15.5)
WBC Count: 8.6 10*3/uL (ref 4.0–10.5)
nRBC: 0 % (ref 0.0–0.2)

## 2018-10-08 LAB — CMP (CANCER CENTER ONLY)
ALT: 22 U/L (ref 0–44)
AST: 22 U/L (ref 15–41)
Albumin: 4.1 g/dL (ref 3.5–5.0)
Alkaline Phosphatase: 65 U/L (ref 38–126)
Anion gap: 9 (ref 5–15)
BUN: 19 mg/dL (ref 8–23)
CO2: 26 mmol/L (ref 22–32)
Calcium: 10.4 mg/dL — ABNORMAL HIGH (ref 8.9–10.3)
Chloride: 104 mmol/L (ref 98–111)
Creatinine: 1.14 mg/dL — ABNORMAL HIGH (ref 0.44–1.00)
GFR, Est AFR Am: 59 mL/min — ABNORMAL LOW (ref >60)
GFR, Estimated: 51 mL/min — ABNORMAL LOW (ref >60)
Glucose, Bld: 93 mg/dL (ref 70–99)
Potassium: 4.5 mmol/L (ref 3.5–5.1)
Sodium: 139 mmol/L (ref 135–145)
Total Bilirubin: 0.9 mg/dL (ref 0.3–1.2)
Total Protein: 8.4 g/dL — ABNORMAL HIGH (ref 6.5–8.1)

## 2018-10-08 NOTE — Progress Notes (Signed)
[place] Counseling Session  The pt presented to session with a frustrated mood and matching affect. The pt was forthcoming and engaged throughout session.  The pt expressed that when coming in for her appt, she was upset by an interaction with a front desk staff member. The pt shared stories of other interactions with [facility] staff members that have made her feel "disrespected" and unheard. The counselor provided validation of the pt's reactions, and the counselor invited the pt to process how she feels in the counseling relationship. The pt expressed feeling respected in the counseling room. The pt and counselor spent the rest of session discussing the pt's fear related to her cancer diagnosis and heart condition. The pt expressed that she is "beginning to realize the severity" of her health concerns. The counselor provided empathetic refections and validation of the pt's fear and emotional pain. The pt expressed that she is needing compassionate support in her life, and has found that she has to choose who she is open with about her experience. The pt again expressed feeling listened to and supported in the counseling room.  The pt and counselor will meet again on Tuesday, February 4 at 12 pm.  Doris Cheadle, Counseling Intern [phone]

## 2018-10-08 NOTE — Progress Notes (Signed)
REFERRING PROVIDER: Stark Klein, MD [address] [place], [place] [address]  PRIMARY PROVIDER:  [name], MD  PRIMARY REASON FOR VISIT:  1. 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 Family history of lung cancer   3. Personal history of malignant neoplasm of breast     HISTORY OF PRESENT ILLNESS:   [name], a 63 y.o. female, was seen for a [facility] cancer genetics consultation at the request of Dr. Barry Dienes due to a personal history of cancer.  Brenda [name] presents to clinic today to discuss the possibility of a hereditary predisposition to cancer, genetic testing, and to further clarify her future cancer risks, as well as potential cancer risks for family members.   In 1999, at the age of [age], Brenda [name] was diagnosed with Left sided breast cancer.   Recently at the age of 37, she was dx with DCIS of the Left breast.  She is scheduled to have a bilateral mastectomy on 11/07/2018.   HORMONAL RISK FACTORS:  Menarche was at age 41.  First live birth at age 75.  Ovaries intact: yes.  Hysterectomy: yes.  Menopausal status: postmenopausal.  Colonoscopy: yes; 2012, no polyps.  Past Medical History:  Diagnosis Date  . Cancer [facility])    breast cancer  . Carotid artery stenosis   . CHF (congestive heart failure) ([place])   . Coronary artery disease 2001  . Depression   . Family history of lung cancer   . Hypercholesteremia   . Hypertension   . Hyperthyroidism   . Stroke [facility])     Past Surgical History:  Procedure Laterality Date  . ABDOMINAL AORTOGRAM W/LOWER EXTREMITY N/A 06/13/2017   Procedure: ABDOMINAL AORTOGRAM W/LOWER EXTREMITY;  Surgeon: Waynetta Sandy, MD;  Location: [place] [facility];  Service: Cardiovascular;  Laterality: N/A;  Bilateral  . ABDOMINAL HYSTERECTOMY    . BREAST LUMPECTOMY  1999  . BREAST LUMPECTOMY     left breast  . CAROTID ENDARTERECTOMY Left  04/2000  . CORONARY ARTERY BYPASS GRAFT  2001   x 5 (left internal mammary artery to left anterior  descending coronary artery  . LEFT HEART CATH AND CORS/GRAFTS ANGIOGRAPHY N/A 02/21/2018   Procedure: LEFT HEART CATH AND CORS/GRAFTS ANGIOGRAPHY;  Surgeon: Lorretta Harp, MD;  Location: [facility] [facility];  Service: Cardiovascular;  Laterality: N/A;  . PERIPHERAL VASCULAR INTERVENTION  06/13/2017   Procedure: PERIPHERAL VASCULAR INTERVENTION;  Surgeon: Waynetta Sandy, MD;  Location: [place] [facility];  Service: Cardiovascular;;  Bilateral Iliac Stents    Social History   Socioeconomic History  . Marital status: Single    Spouse name: Not on file  . Number of children: 1  . Years of education: Not on file  . Highest education level: Not on file  Occupational History  . 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 Financial resource strain: Not on file  . Food insecurity:    Worry: Not on file    Inability: Not on file  . Transportation needs:    Medical: Not on file    Non-medical: Not on file  Tobacco Use  . Smoking status: Former Smoker    Types: Cigarettes    Last attempt to quit: 09/11/2001    Years since quitting: 17.0  . Smokeless tobacco: Never Used  Substance and Sexual Activity  . Alcohol use: No  .  Drug use: No  . Sexual activity: Not on file  Lifestyle  . Physical activity:    Days per week: Not on file    Minutes per session: Not on file  . Stress: Not on file  Relationships  . Social connections:    Talks on phone: Not on file    Gets together: Not on file    Attends religious service: Not on file    Active member of club or organization: Not on file    Attends meetings of clubs or organizations: Not on file    Relationship status: Not on file  Other Topics Concern  . Not on file  Social History Narrative  . Not on file     FAMILY HISTORY:  We obtained a detailed, 4-generation family history.   Significant diagnoses are listed below: Family History  Problem Relation Age of Onset  . Lung cancer Mother 29       died at an early age  . Other Other        There is no Hx of premature coronary artery disease in her family    Brenda [name] has a 22 year-old daughter who no hx of cancer.  She has 2 granddaughters (ages 29 and 80).    Brenda [name] has 1 maternal half-sister.  Brenda [name] father: unk. Patient has no information about her paternal family history.   Brenda [name] mother: died in her 83's due to lung cancer.  Maternal Aunts/Uncles: none, mother was only child.  Maternal grandfather: unk Maternal grandmother:unk  Brenda [name] is unaware of previous family history of genetic testing for hereditary cancer risks. Patient's maternal ancestors are of African american/Native American descent, and paternal ancestors are of unknown descent. There is no reported Ashkenazi Jewish ancestry. There is no known consanguinity.  GENETIC COUNSELING ASSESSMENT: Brenda [name] is a 64 y.o. female with a personal history which is somewhat suggestive of a Hereditary Cancer Predisposition Syndrome. We, therefore, discussed and recommended the following at today's visit.   DISCUSSION: We reviewed the characteristics, features and inheritance patterns of hereditary cancer syndromes. We also discussed genetic testing, including the appropriate family members to test, the process of testing, insurance coverage and turn-around-time for results. We discussed the implications of a negative, positive and/or variant of uncertain significant result. We recommended [name] pursue genetic testing for the Common Hereditary Cancers gene panel + EGFR.   The Common Hereditary Cancer Panel offered by [facility] includes sequencing and/or deletion duplication testing of the following 53 genes: APC, ATM, AXIN2, BARD1, BMPR1A, BRCA1, BRCA2, BRIP1, BUB1B, CDH1, CDK4, CDKN2A, CHEK2, CTNNA1, DICER1, ENG, EPCAM, GALNT12, GREM1,  HOXB13, KIT, MEN1, MLH1, MLH3, MSH2, MSH3, MSH6, MUTYH, NBN, NF1, NTHL1, PALB2, PDGFRA, PMS2, POLD1, POLE, PTEN, RAD50, RAD51C, RAD51D, RNF43, RPS20, SDHA, SDHB, SDHC, SDHD, SMAD4, SMARCA4, STK11, TP53, TSC1, TSC2, VHL  We discussed that only 5-10% of cancers are associated with a Hereditary cancer predisposition syndrome.  One of the most common hereditary cancer syndromes that increases breast cancer risk is called Hereditary Breast and Ovarian Cancer (HBOC) syndrome.  This syndrome is caused by mutations in the BRCA1 and BRCA2 genes.  This syndrome increases an individual's lifetime risk to develop breast, ovarian, pancreatic, and other types of cancer.  There are also many other cancer predisposition syndromes caused by mutations in several other genes.  We discussed that if she is found to have a mutation in one of these genes, it may impact surgical decisions, and alter future medical management recommendations  such as increased cancer screenings and consideration of risk reducing surgeries.  A positive result could also have implications for the patient's family members.  A Negative result would mean we were unable to identify a hereditary component to her cancer, but does not rule out the possibility of a hereditary basis for her cancer.  There could be mutations that are undetectable by current technology, or in genes not yet tested or identified to increase cancer risk.    We discussed the potential to find a Variant of Uncertain Significance or VUS.  These are variants that have not yet been identified as pathogenic or benign, and it is unknown if this variant is associated with increased cancer risk or if this is a normal finding.  Most VUS's are reclassified to benign or likely benign.   It should not be used to make medical management decisions. With time, we suspect the lab will determine the significance of any VUS's identified if any.   Based on Ms. Oshea personal history of cancer, she  meets medical criteria for genetic testing. Despite that she meets criteria, she may still have an out of pocket cost. The laboratory can provide her with an estimate of her OOP cost. she was given the contact information of the laboratory if she has further questions.   PLAN: After considering the risks, benefits, and limitations, Ms. Barbee  provided informed consent to pursue genetic testing and the blood sample was sent to [facility] for analysis of the Common Hereditary Cancers Panel + EGFR. Results should be available within approximately 2-3 weeks' time, at which point they will be disclosed by telephone to Ms. Redlich, as will any additional recommendations warranted by these results. Ms. Bokhari will receive a summary of her genetic counseling visit and a copy of her results once available. This information will also be available in Epic. We encouraged Ms. Baar to remain in contact with cancer genetics annually so that we can continuously update the family history and inform her of any changes in cancer genetics and testing that may be of benefit for her family. Ms. Robben questions were answered to her satisfaction today. Our contact information was provided should additional questions or concerns arise.  Lastly, we encouraged Ms. Gehrig to remain in contact with cancer genetics annually so that we can continuously update the family history and inform her of any changes in cancer genetics and testing that may be of benefit for this family.   Ms.  Jonsson questions were answered to her satisfaction today. Our contact information was provided should additional questions or concerns arise. Thank you for the referral and allowing [place] to share in the care of your patient.   Tana Felts, MS, [facility] Certified Genetic Counselor [email or web address] phone: [phone]  The patient was seen for a total of 30 minutes in face-to-face genetic counseling.   This patient was discussed  with Drs. Magrinat, Lindi Adie and/or Burr Medico who agrees with the above.

## 2018-10-09 ENCOUNTER — Encounter: Payer: Self-pay | Admitting: *Deleted

## 2018-10-09 ENCOUNTER — Telehealth: Payer: Self-pay | Admitting: Oncology

## 2018-10-09 NOTE — Telephone Encounter (Signed)
Scheduled appt per 1/28 sch message - pt is aware of appt date and time   

## 2018-10-10 ENCOUNTER — Encounter: Payer: Self-pay | Admitting: *Deleted

## 2018-10-14 ENCOUNTER — Telehealth: Payer: Self-pay | Admitting: Genetics

## 2018-10-14 NOTE — Telephone Encounter (Signed)
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   We discussed increased breast cancer risk and slightly increased pancreatic cancer risk.   Brenda [name] is planning to have a bilateral mastectomy 11/07/2018.  She reports having a lesion identified on her pancreas about a year ago that she was supposed to follow-up with Dr. Barry Dienes about.  We recommend discussing follow-up for this finding with her and will make sure she has access to these results.   We discussed implication for family members. We recommended her daughter an all of her relatives also get tested for this mutation.  Brenda [name] gives permission to discuss her genetic test results and all information obtained during her genetic counseling session with her daughter Brenda [name].  She will have her daughter call me to arrange genetic counseling and testing.   We offered Brenda [name] the option to meet with [place] again in genetics for a follow-up discussion.  She did not have any further questions at this time and declined this appointment.  She said she will let us know if she has further questions.    We will forward these results to her oncology team for follow-up.

## 2018-10-21 NOTE — Telephone Encounter (Signed)
Patient Called to schedule daughter's appointment.  She gives permission to share her genetic test result and all information obtained during her genetic counseling session with her daughter Brenda [name] DOB 03/11/1989

## 2018-10-23 ENCOUNTER — Encounter: Payer: Self-pay | Admitting: Genetics

## 2018-10-25 ENCOUNTER — Telehealth: Payer: Self-pay | Admitting: General Practice

## 2018-10-25 ENCOUNTER — Ambulatory Visit: Payer: Self-pay | Admitting: Genetics

## 2018-10-25 DIAGNOSIS — Z853 Personal history of malignant neoplasm of breast: Secondary | ICD-10-CM

## 2018-10-25 DIAGNOSIS — Z1379 Encounter for other screening for genetic and chromosomal anomalies: Secondary | ICD-10-CM | POA: Insufficient documentation

## 2018-10-25 DIAGNOSIS — D0512 Intraductal carcinoma in situ of left breast: Secondary | ICD-10-CM

## 2018-10-25 DIAGNOSIS — Z801 Family history of malignant neoplasm of trachea, bronchus and lung: Secondary | ICD-10-CM

## 2018-10-25 DIAGNOSIS — Z1509 Genetic susceptibility to other malignant neoplasm: Secondary | ICD-10-CM

## 2018-10-25 DIAGNOSIS — C50912 Malignant neoplasm of unspecified site of left female breast: Secondary | ICD-10-CM

## 2018-10-25 DIAGNOSIS — Z171 Estrogen receptor negative status [ER-]: Secondary | ICD-10-CM

## 2018-10-25 DIAGNOSIS — Z1501 Genetic susceptibility to malignant neoplasm of breast: Secondary | ICD-10-CM | POA: Insufficient documentation

## 2018-10-25 DIAGNOSIS — Z1589 Genetic susceptibility to other disease: Secondary | ICD-10-CM

## 2018-10-25 DIAGNOSIS — C50112 Malignant neoplasm of central portion of left female breast: Secondary | ICD-10-CM

## 2018-10-25 NOTE — Telephone Encounter (Signed)
[facility] CSW Progress Notes  Referral received from [facility] - patient would like to complete Advance Directives.  CSW will mail documents to review, patient will complete in community and bring to her preop testing visit.  Patient aware that [facility] CSWs can help notarize document if desired.  Edwyna Shell, LCSW Clinical Social Worker Phone:  [phone]

## 2018-10-25 NOTE — Progress Notes (Signed)
HPI: [name] was previously seen in the [place] [facility] clinic on 10/08/2018 due to a personal and family history of cancer and concerns regarding a hereditary predisposition to cancer. Please refer to our prior cancer genetics clinic note for more information regarding [name]'s medical, social and family histories, and our assessment and recommendations, at the time. [name] recent genetic test results were disclosed to her, as well as recommendations warranted by these results. These results and recommendations are discussed in more detail below.  CANCER HISTORY:  In 1999, at the age of [age], [name] was diagnosed with Left sided breast cancer.  Recently at the age of 45, she was dx with DCIS of the Left breast.  She is scheduled to have a bilateral mastectomy on 11/07/2018.    FAMILY HISTORY:  We obtained a detailed, 4-generation family history.  Significant diagnoses are listed below: Family History  Problem Relation Age of Onset  . Lung cancer Mother 40       died at an early age  . Other Other        There is no Hx of premature coronary artery disease in her family    [name] has a 63 year-old daughter who no hx of cancer.  She has 2 granddaughters (ages 50 and 46).    [name] has 1 maternal half-sister.  [name] father: unk. Patient has no information about her paternal family history.   [name] mother: died in her 66's due to lung cancer.  Maternal Aunts/Uncles: none, mother was only child.  Maternal grandfather: unk Maternal grandmother:unk  [name] is unaware of previous family history of genetic testing for hereditary cancer risks. Patient's maternal ancestors are of African american/Native American descent, and paternal ancestors are of unknown descent. There is no reported Ashkenazi Jewish ancestry. There is no known consanguinity.  GENETIC TEST RESULTS: Genetic testing was performed through [facility]'s Common Hereditary Caners  Panel + EGFR and reported out on 10/14/2018.   Results: POSITIVEfor a Pathogenic variant in [place] called c.3113G>A ([id number]*)  The test report will be scanned into EPIC and will be located under the Molecular Pathology section of the Results Review tab.A portion of the result report is included below for reference.     We discussed with [name] that because current genetic testing is not perfect, it is possible there may be a gene mutation in one of these genes that current testing cannot detect, but that chance is small. We also discussed, that there could be another gene that has not yet been discovered, or that we have not yet tested, that is responsible for the cancer diagnoses in the family. Therefore, it is important to remain in touch with cancer genetics in the future so that we can continue to offer Brenda [name] the most up to date genetic testing.   PALB2 The PALB2 gene provides instructions for making a protein called "Partner And Localizer of BRCA2". As its name suggests, this protein interacts with the protein produced from the BRCA2 gene. These two proteins work together to mend broken strands of DNA, which prevents cells from accumulating genetic damage that can trigger them to divide uncontrollably. Because the PALB2 and BRCA2 proteins help control the rate of cell growth and division, they are described as tumor suppressors.  The PALB2 protein stabilizes the BRCA2 protein which allows the BRCA2 protein to fix damaged DNA. Breaks in DNA can be caused by natural and medical radiation or other environmental  exposures. DNA breaks can also occur when chromosomes exchange genetic material in preparation for cell division. By helping repair mistakes in DNA, the PALB2 and BRCA2 proteins play a critical role in maintaining the stability of a cell's genetic information.  Individuals with one heterzygous mutation in PALB2 are at increased risk to develop breast, pancreatic, and  possibly other types of cancer.   Breast Cancer: Women with a PALB2 mutation are at an increased risk to develop breast cancer. The risk is about 25-58%. Women with a family history of breast cancer have risks at the higher end of this range.  Women are also at an elevated risk to develop a 2nd primary breast cancer.   Breast Cancer Screening Recommendations for Women: . Breast self-exams beginning at age 62 (for women and men) . Clinical breast exams every 6-12 months beginning at age 43 . Annual mammography and breast MRI beginning at age 39, or 10 years earlier than the youngest diagnosis of breast cancer in the family.(typically alternating every 6 months). . Consider risk reducing options including risk reducing/preventative double mastectomy (removing both breasts) or risk reducing medications.  [name] is already scheduled for bilateral mastectomy on 11/07/2018.   Men with PALB2 mutations also have an increased risk to develop breast cancer, however the risks are not well established are are significantly less than the breast cancer risk for women.   Pancreatic Cancer: Men and women with a PALB2 mutation are at increased risk to develop pancreatic cancer. The exact risk is unknown at this time.  If there is a family history of pancreatic cancer in a family with a PALB2 mutation, pancreatic cancer screening may be considered. Without a family history of pancreatic cancer, generally pancreatic cancer screening is not recommended.    [name] does not report a family history of pancreatic cancer.  However, she does not know much about her family healthy history.  She also reports a pancreatic mass that was identified about a year ago that she reports was being monitored.  We recommended she discuss these results with her providers', it may be a relevant consideration in the assessment of this mass.   Lifestyle factors such as not/quitting smoking are also helpful to limit  risk.We discussed some of the symptoms of pancreatic cancer to be aware of.   Other Cancers: There is some evidence to suggest that individuals with a PALB2 mutation also have an increased risk to develop some other types of cancer such as ovarian, prostate, and possibly other types of cancer. There is currently not enough information to make any specific recommendations about screening or risk reducing surgery for these cancer types.   Fanconi Anemia: Individuals who have2 pathogenic mutations in both of their PALB2 genes have a condition called Fanconi Anemia. This is a condition that often begins in childhood and causes bone abnormalities, bone marrow failure, anemia, increased risk for blood cancers, and several other abnormalities. BrendaGrahamonly has 1 mutation in [place] anddoes not have this condition, however she is a carrier for this disease. If 2 individuals who are both carriers (have 1 PALB2 mutation) have children together, then there is a risk for their children to be affected with Fanconi Anemia.   FAMILY MEMBERS:It is important that all of BrendaBrenda [name]'s relatives (both men and women) know about this gene mutation. Site-specific genetic testing can sort out who in thefamily is at risk and who is not.   BrendaBrenda [name]'s children andsiblings eachhave a 50% chanceto have inherited thismutation. We recommend they  have genetic testing for this same mutation, as identifying the presence of this mutation would allow them to also take advantage of risk-reducing measures. Other relatives such as aunts/uncles/cousins, etc are also at risk to have inherited this mutation.   PLAN: 1. [name] was offered a follow-up appointment with genetics to discuss this result.  She declined at this time as she did not have further questions.   2.  [name] reported she plans to follow-up with her oncology team Dr. Barry Dienes and Dr.  Jana Hakim regarding this result.   3. [name] plans to  tell her children and other relatives about this mutation and will let us know if we can be of any assistance in testing or educating her family members.  [name] gives permission to discuss her genetic test results and any information obtained during her genetic counseling session with her daughter Keaja Reaume who will be coming for genetic counseling and genetic testing on 11/25/2018.    SUPPORT AND RESOURCES: If [name] is interested in more information and support for individuals with a PALB2 mutation, there are two groups, Facing Our Risk (www.facingourrisk.com) and Bright Pink (www.brightpink.org) which some people have found useful. They provide opportunities to speak with other individuals from high-risk families. To locate genetic counselors in other cities, visit the website of the [employer] (ArtistMovie.se) and Secretary/administrator for a Social worker by zip code.   We encouraged [name] to remain in contact with [place] on an annual basis so we can update her personal and family histories, and let her know of advances in cancer genetics that may benefit the family. Our contact number was provided. [name] questions were answered to her satisfaction today, and she knows she is welcome to call anytime with additional questions.    Ferol Luz, MS, [facility] Genetic Counselor [phone] [email or web address]

## 2018-10-30 NOTE — Progress Notes (Signed)
I spoke with Abigail Butts the triage RN at [facility] about needing cardiac and neurology clearance for this pt. Also requested that neuro/vascular surgery address the how pt should manage her Plavix prior to surgery. Will continue to follow.

## 2018-10-31 ENCOUNTER — Telehealth: Payer: Self-pay | Admitting: Cardiology

## 2018-10-31 NOTE — Telephone Encounter (Signed)
   Primary Cardiologist: Kirk Ruths, MD  Chart reviewed as part of pre-operative protocol coverage. Patient was contacted 10/31/2018 in reference to pre-operative risk assessment for pending surgery as outlined below.  Brenda [name] was last seen on 08/13/18 by Dr. Stanford Breed.  Since that day, Brenda [name] has done well w/o angina. She is able to complete > 4 METs of activity w/o exertional CP or dyspnea.   Therefore, based on ACC/AHA guidelines, the patient would be at acceptable risk for the planned procedure without further cardiovascular testing.   Pt is on Plavix prescribed by vascular surgery, but pt reports she was cleared by Dr. Donzetta Matters to hold.   She needs clearance from cardiology to hold ASA. Will route to Dr. Stanford Breed for clearance.   Lyda Jester, PA-C 10/31/2018, 3:12 PM

## 2018-10-31 NOTE — Telephone Encounter (Signed)
° °  [facility] Pre-operative Risk Assessment    Request for surgical clearance:  1. What type of surgery is being performed?Breat Removed    2. When is this surgery scheduled? 11-07-18   3. What type of clearance is required (medical clearance vs. Pharmacy clearance to hold med vs. Both)? Medical  4. Are there any medications that need to be held prior to surgery and how long?Can pt stop her Aspirin, if so how many days prior to surgery   5. Practice name and name of physician performing surgery? Dr Barry Dienes   6. What is your office phone number [phone]    7.   What is your office fax number [phone]*  8.   Anesthesia type (None, local, MAC, general) ?General    Brenda [name] 10/31/2018, 2:57 PM  _________________________________________________________________   (provider comments below)

## 2018-10-31 NOTE — Telephone Encounter (Signed)
Ok to hold asa Brenda [name]  

## 2018-10-31 NOTE — Telephone Encounter (Signed)
No need for message at this time

## 2018-11-01 ENCOUNTER — Other Ambulatory Visit: Payer: Self-pay

## 2018-11-01 ENCOUNTER — Encounter (HOSPITAL_BASED_OUTPATIENT_CLINIC_OR_DEPARTMENT_OTHER): Payer: Self-pay | Admitting: *Deleted

## 2018-11-01 NOTE — Progress Notes (Signed)
Ensure pre surgery drink given with instructions to complete by [place], surgical soap given with instructions, pt verbalized understanding.

## 2018-11-01 NOTE — Telephone Encounter (Signed)
   Primary Cardiologist: Kirk Ruths, MD  Chart reviewed as part of pre-operative protocol coverage.  Per Lyda Jester, patient was contacted 10/31/2018 in reference to pre-operative risk assessment for pending surgery as outlined below.  Coline Calkin was last seen on 08/13/18 by Dr. Stanford Breed.  Since that day, Shakiyla Kook has done well w/o angina. She is able to complete > 4 METs of activity w/o exertional CP or dyspnea.   Per Dr. Stanford Breed, patient may hold ASA prior to procedure. Vascular surgery prescribes plavix and patient reports she was cleared by Dr. Donzetta Matters to hold prior to surgery.  Therefore, based on ACC/AHA guidelines, the patient would be at acceptable risk for the planned procedure without further cardiovascular testing.   I will route this recommendation to the requesting party via Epic fax function and remove from pre-op pool.  Please call with questions.  Tami Lin Duke, PA 11/01/2018, 7:46 AM

## 2018-11-03 ENCOUNTER — Other Ambulatory Visit: Payer: Self-pay | Admitting: Oncology

## 2018-11-04 ENCOUNTER — Other Ambulatory Visit: Payer: Self-pay | Admitting: Cardiology

## 2018-11-05 NOTE — H&P (Addendum)
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 Unfortunately on her follow up mammogram this year she had some calcifications seen in the left breast, upper outer quadrant. This is the same breast as her previous cancer. She was seen to have grouped microcalcifications in the upper outer quadrant. These were biopsied and showed DCIS, intermediate grade with calcifications, ER/PR +. She has no breast symptoms before the biopsy, but has has a small hematoma that is improving.   Pt has previous breast cancer in 1999. Dr. Rebekah Chesterfield did her surgery. She had surgery including "8" lymph nodes removed. She then had chemotherapy and radiation. She had significant wound issues after that surgery and had CHF and a stroke following radiation.  Diagnostic mammogram on left 08/15/2018  EXAM: DIGITAL DIAGNOSTIC LEFT MAMMOGRAM WITH CAD  COMPARISON: Previous exam(s).  ACR Breast Density Category b: There are scattered areas of fibroglandular density.  FINDINGS: Spot compression magnification views were performed over the upper outer far posterior left breast. There are grouped microcalcifications in the region of the lumpectomy site in the far upper outer posterior left breast varying in shape size and density, spanning approximately 2.7 cm.  Mammographic images were processed with CAD.  IMPRESSION: Suspicious left breast calcifications.  RECOMMENDATION: Attempt at stereotactic guided biopsy of suspicious calcifications in the upper-outer posterior left breast is recommended.  Given the far posterolateral location of these calcifications biopsy may not be feasible, however recommend attempting to biopsy the anterior portion of these calcifications possibly from a lateral approach with the breast in the MLO  projection.  I have discussed the findings and recommendations with the patient. Results were also provided in writing at the conclusion of the visit. If applicable, a reminder letter will be sent to the patient regarding the next appointment.  BI-RADS CATEGORY 4: Suspicious.  pathology 08/19/2018  Diagnosis Breast, left, needle core biopsy, upper outer far posterior - DUCTAL CARCINOMA IN SITU, INTERMEDIATE NUCLEAR GRADE WITH CALCIFICATIONS.  IMMUNOHISTOCHEMICAL AND MORPHOMETRIC ANALYSIS PERFORMED MANUALLY Estrogen Receptor: 100%, POSITIVE, STRONG STAINING INTENSITY Progesterone Receptor: 90%, POSITIVE, STRONG STAINING INTENSITY   Medication History (Armen Ferguson, CMA; 09/13/2018 12:09 PM) Potassium Chloride ER (20MEQ Tablet ER, Oral) Active. Rosuvastatin Calcium (20MG Tablet, Oral) Active. Montelukast Sodium (10MG Tablet, Oral) Active. Metoprolol Succinate ER (100MG Tablet ER 24HR, Oral) Active. AmLODIPine Besylate (5MG Tablet, Oral) Active. Aspirin (81MG Tablet, Oral) Active. Cholecalciferol (2000UNIT Capsule, Oral) Active. Multiple Vitamin (Oral) Active. Plavix (75MG Tablet, Oral) Active. Effexor (37.5MG Tablet, Oral) Active. Spironolactone (25MG Tablet, Oral) Active. Lisinopril (20MG Tablet, Oral) Active. Medications Reconciled    Review of Systems All other systems negative  Vitals Weight: 159.25 lb Height: 65in Body Surface Area: 1.8 m Body Mass Index: 26.5 kg/m  Temp.: 99.51F  Pulse: 75 (Regular)  P.OX: 98% (Room air) BP: 138/84 (Sitting, Left Arm, Standard)       Physical Exam  General Mental Status-Alert. General Appearance-Consistent with stated age. Hydration-Well hydrated. Voice-Normal.  Head and Neck Head-normocephalic, atraumatic with no lesions or palpable masses. Trachea-midline. Thyroid Gland Characteristics - normal size and consistency.  Eye Eyeball - Bilateral-Extraocular movements  intact. Sclera/Conjunctiva - Bilateral-No scleral icterus.  Chest and Lung Exam Chest and lung exam reveals -quiet, even and easy respiratory effort with no use of accessory muscles and on auscultation, normal breath sounds, no adventitious sounds and  normal vocal resonance. Inspection Chest Wall - Normal. Back - normal.  Breast Note: left breast has scar in upper portion of breast that is tethered to the chest wall. no palpable masses. no nipple retraction. breasts ptotic bilaterally, but left breast significantly smaller than right. no LAD.   Cardiovascular Cardiovascular examination reveals -normal heart sounds, regular rate and rhythm with no murmurs and normal pedal pulses bilaterally.  Abdomen Inspection Inspection of the abdomen reveals - No Hernias. Palpation/Percussion Palpation and Percussion of the abdomen reveal - Soft, Non Tender, No Rebound tenderness, No Rigidity (guarding) and No hepatosplenomegaly. Auscultation Auscultation of the abdomen reveals - Bowel sounds normal.  Neurologic Neurologic evaluation reveals -alert and oriented x 3 with no impairment of recent or remote memory. Mental Status-Normal.  Musculoskeletal Global Assessment -Note: no gross deformities.  Normal Exam - Left-Upper Extremity Strength Normal and Lower Extremity Strength Normal. Normal Exam - Right-Upper Extremity Strength Normal and Lower Extremity Strength Normal.  Lymphatic Head & Neck  General Head & Neck Lymphatics: Bilateral - Description - Normal. Axillary  General Axillary Region: Bilateral - Description - Normal. Tenderness - Non Tender. Femoral & Inguinal  Generalized Femoral & Inguinal Lymphatics: Bilateral - Description - No Generalized lymphadenopathy.    Assessment & Plan  MALIGNANT NEOPLASM OF UPPER-OUTER QUADRANT OF LEFT BREAST IN FEMALE, ESTROGEN RECEPTOR POSITIVE (C50.412) Impression: Will plan mastectomy given the fact that she has had  previous radiation. Will attempt mapping, but since she has DCIS at this point, I would not do completion ALND if she does not map.  She would like contralateral mastectomy for symmetry given her significant ptosis.  Will refer to plastic surgery.  She will need clearance to hold plavix. Current Plans Schedule for Surgery Referred to Surgery - Plastic, for evaluation and follow up (Plastic Surgery). Routine. Pt Education - [facility] Mastectomy [facility]   Signed by Stark Klein, MD

## 2018-11-07 ENCOUNTER — Encounter (HOSPITAL_COMMUNITY)
Admission: RE | Admit: 2018-11-07 | Discharge: 2018-11-07 | Disposition: A | Discharge status: Home / Self Care | Payer: Medicare Other | Source: Ambulatory Visit | Attending: General Surgery | Admitting: General Surgery

## 2018-11-07 ENCOUNTER — Encounter (HOSPITAL_BASED_OUTPATIENT_CLINIC_OR_DEPARTMENT_OTHER): Payer: Self-pay

## 2018-11-07 ENCOUNTER — Encounter (HOSPITAL_BASED_OUTPATIENT_CLINIC_OR_DEPARTMENT_OTHER)
Admission: RE | Disposition: A | Discharge status: Home / Self Care | Payer: Self-pay | Source: Ambulatory Visit | Attending: General Surgery

## 2018-11-07 ENCOUNTER — Ambulatory Visit (HOSPITAL_BASED_OUTPATIENT_CLINIC_OR_DEPARTMENT_OTHER): Payer: Medicare Other | Admitting: Physician Assistant

## 2018-11-07 ENCOUNTER — Ambulatory Visit (HOSPITAL_BASED_OUTPATIENT_CLINIC_OR_DEPARTMENT_OTHER)
Admission: RE | Admit: 2018-11-07 | Discharge: 2018-11-08 | Disposition: A | Discharge status: Home / Self Care | Payer: Medicare Other | Source: Ambulatory Visit | Attending: General Surgery | Admitting: General Surgery

## 2018-11-07 ENCOUNTER — Other Ambulatory Visit: Payer: Self-pay

## 2018-11-07 DIAGNOSIS — F329 Major depressive disorder, single episode, unspecified: Secondary | ICD-10-CM | POA: Diagnosis not present

## 2018-11-07 DIAGNOSIS — I2510 Atherosclerotic heart disease of native coronary artery without angina pectoris: Secondary | ICD-10-CM | POA: Insufficient documentation

## 2018-11-07 DIAGNOSIS — N6011 Diffuse cystic mastopathy of right breast: Secondary | ICD-10-CM | POA: Insufficient documentation

## 2018-11-07 DIAGNOSIS — I509 Heart failure, unspecified: Secondary | ICD-10-CM | POA: Insufficient documentation

## 2018-11-07 DIAGNOSIS — Z8673 Personal history of transient ischemic attack (TIA), and cerebral infarction without residual deficits: Secondary | ICD-10-CM | POA: Diagnosis not present

## 2018-11-07 DIAGNOSIS — Z923 Personal history of irradiation: Secondary | ICD-10-CM | POA: Diagnosis not present

## 2018-11-07 DIAGNOSIS — F419 Anxiety disorder, unspecified: Secondary | ICD-10-CM | POA: Insufficient documentation

## 2018-11-07 DIAGNOSIS — Z7902 Long term (current) use of antithrombotics/antiplatelets: Secondary | ICD-10-CM | POA: Diagnosis not present

## 2018-11-07 DIAGNOSIS — Z951 Presence of aortocoronary bypass graft: Secondary | ICD-10-CM | POA: Diagnosis not present

## 2018-11-07 DIAGNOSIS — Z87891 Personal history of nicotine dependence: Secondary | ICD-10-CM | POA: Insufficient documentation

## 2018-11-07 DIAGNOSIS — Z9221 Personal history of antineoplastic chemotherapy: Secondary | ICD-10-CM | POA: Insufficient documentation

## 2018-11-07 DIAGNOSIS — Z170 Estrogen receptor positive status [ER+]: Secondary | ICD-10-CM | POA: Diagnosis not present

## 2018-11-07 DIAGNOSIS — Z7982 Long term (current) use of aspirin: Secondary | ICD-10-CM | POA: Diagnosis not present

## 2018-11-07 DIAGNOSIS — Z853 Personal history of malignant neoplasm of breast: Secondary | ICD-10-CM | POA: Insufficient documentation

## 2018-11-07 DIAGNOSIS — I739 Peripheral vascular disease, unspecified: Secondary | ICD-10-CM | POA: Insufficient documentation

## 2018-11-07 DIAGNOSIS — I110 Hypertensive heart disease with heart failure: Secondary | ICD-10-CM | POA: Insufficient documentation

## 2018-11-07 DIAGNOSIS — D0512 Intraductal carcinoma in situ of left breast: Secondary | ICD-10-CM | POA: Diagnosis present

## 2018-11-07 DIAGNOSIS — Z79899 Other long term (current) drug therapy: Secondary | ICD-10-CM | POA: Diagnosis not present

## 2018-11-07 DIAGNOSIS — D0592 Unspecified type of carcinoma in situ of left breast: Secondary | ICD-10-CM | POA: Diagnosis present

## 2018-11-07 DIAGNOSIS — C50412 Malignant neoplasm of upper-outer quadrant of left female breast: Secondary | ICD-10-CM

## 2018-11-07 DIAGNOSIS — Z888 Allergy status to other drugs, medicaments and biological substances status: Secondary | ICD-10-CM | POA: Insufficient documentation

## 2018-11-07 HISTORY — PX: TOTAL MASTECTOMY: SHX6129

## 2018-11-07 HISTORY — DX: Presence of aortocoronary bypass graft: Z95.1

## 2018-11-07 HISTORY — DX: Anxiety disorder, unspecified: F41.9

## 2018-11-07 SURGERY — MASTECTOMY, SIMPLE
Anesthesia: General | Site: Breast | Laterality: Bilateral

## 2018-11-07 MED ORDER — SODIUM CHLORIDE (PF) 0.9 % IJ SOLN
INTRAVENOUS | Status: DC | PRN
  Administered 2018-11-07: 5 mL via INTRAMUSCULAR

## 2018-11-07 MED ORDER — LISINOPRIL-HYDROCHLOROTHIAZIDE 20-25 MG PO TABS
1.0000 | ORAL_TABLET | Freq: Every day | ORAL | Status: DC
  Administered 2018-11-07: 1 via ORAL

## 2018-11-07 MED ORDER — MEPERIDINE HCL 25 MG/ML IJ SOLN: 6.2500 mg | INTRAMUSCULAR | Status: DC | PRN

## 2018-11-07 MED ORDER — FENTANYL CITRATE (PF) 100 MCG/2ML IJ SOLN
INTRAMUSCULAR | Status: AC
  Filled 2018-11-07: qty 2

## 2018-11-07 MED ORDER — ACETAMINOPHEN 650 MG RE SUPP: 650.0000 mg | Freq: Four times a day (QID) | RECTAL | Status: DC | PRN

## 2018-11-07 MED ORDER — CEFAZOLIN SODIUM-DEXTROSE 2-4 GM/100ML-% IV SOLN
2.0000 g | Freq: Three times a day (TID) | INTRAVENOUS | Status: AC
  Administered 2018-11-07: 2 g via INTRAVENOUS
  Filled 2018-11-07: qty 100

## 2018-11-07 MED ORDER — GABAPENTIN 300 MG PO CAPS
300.0000 mg | ORAL_CAPSULE | ORAL | Status: AC
  Administered 2018-11-07: 300 mg via ORAL

## 2018-11-07 MED ORDER — METOCLOPRAMIDE HCL 5 MG/ML IJ SOLN: 10.0000 mg | Freq: Once | INTRAMUSCULAR | Status: DC | PRN

## 2018-11-07 MED ORDER — ONDANSETRON HCL 4 MG/2ML IJ SOLN
INTRAMUSCULAR | Status: DC | PRN
  Administered 2018-11-07: 4 mg via INTRAVENOUS

## 2018-11-07 MED ORDER — POTASSIUM CHLORIDE CRYS ER 20 MEQ PO TBCR: 20.0000 meq | EXTENDED_RELEASE_TABLET | Freq: Every day | ORAL | Status: DC

## 2018-11-07 MED ORDER — DEXAMETHASONE SODIUM PHOSPHATE 4 MG/ML IJ SOLN
INTRAMUSCULAR | Status: DC | PRN
  Administered 2018-11-07: 10 mg via INTRAVENOUS

## 2018-11-07 MED ORDER — OXYCODONE HCL 5 MG PO TABS: 5.0000 mg | ORAL_TABLET | Freq: Four times a day (QID) | ORAL | 0 refills | Status: DC | PRN

## 2018-11-07 MED ORDER — FENTANYL CITRATE (PF) 100 MCG/2ML IJ SOLN
50.0000 ug | INTRAMUSCULAR | Status: DC | PRN
  Administered 2018-11-07: 100 ug via INTRAVENOUS

## 2018-11-07 MED ORDER — LACTATED RINGERS IV SOLN
INTRAVENOUS | Status: DC
  Administered 2018-11-07 (×2): via INTRAVENOUS

## 2018-11-07 MED ORDER — SENNOSIDES-DOCUSATE SODIUM 8.6-50 MG PO TABS: 1.0000 | ORAL_TABLET | Freq: Every evening | ORAL | Status: DC | PRN

## 2018-11-07 MED ORDER — SCOPOLAMINE 1 MG/3DAYS TD PT72: 1.0000 | MEDICATED_PATCH | Freq: Once | TRANSDERMAL | Status: DC | PRN

## 2018-11-07 MED ORDER — SODIUM CHLORIDE 0.9 % IV SOLN
INTRAVENOUS | Status: DC | PRN
  Administered 2018-11-07: 50 ug/min via INTRAVENOUS

## 2018-11-07 MED ORDER — OXYCODONE HCL 5 MG PO TABS
5.0000 mg | ORAL_TABLET | ORAL | Status: DC | PRN
  Administered 2018-11-08: 5 mg via ORAL
  Filled 2018-11-07: qty 1

## 2018-11-07 MED ORDER — DEXAMETHASONE SODIUM PHOSPHATE 10 MG/ML IJ SOLN
INTRAMUSCULAR | Status: AC
  Filled 2018-11-07: qty 1

## 2018-11-07 MED ORDER — FENTANYL CITRATE (PF) 100 MCG/2ML IJ SOLN
25.0000 ug | INTRAMUSCULAR | Status: DC | PRN
  Administered 2018-11-07 (×2): 25 ug via INTRAVENOUS

## 2018-11-07 MED ORDER — KCL IN DEXTROSE-NACL 20-5-0.45 MEQ/L-%-% IV SOLN
INTRAVENOUS | Status: AC
  Administered 2018-11-07: 15:00:00 via INTRAVENOUS
  Filled 2018-11-07: qty 1000

## 2018-11-07 MED ORDER — MIDAZOLAM HCL 2 MG/2ML IJ SOLN
1.0000 mg | INTRAMUSCULAR | Status: DC | PRN
  Administered 2018-11-07: 2 mg via INTRAVENOUS

## 2018-11-07 MED ORDER — CEFAZOLIN SODIUM-DEXTROSE 2-4 GM/100ML-% IV SOLN
2.0000 g | INTRAVENOUS | Status: AC
  Administered 2018-11-07: 2 g via INTRAVENOUS

## 2018-11-07 MED ORDER — ISOSORBIDE MONONITRATE ER 30 MG PO TB24: 30.0000 mg | ORAL_TABLET | Freq: Every day | ORAL | Status: DC

## 2018-11-07 MED ORDER — GABAPENTIN 300 MG PO CAPS
ORAL_CAPSULE | ORAL | Status: AC
  Filled 2018-11-07: qty 1

## 2018-11-07 MED ORDER — VENLAFAXINE HCL ER 37.5 MG PO CP24: 37.5000 mg | ORAL_CAPSULE | Freq: Every day | ORAL | Status: DC

## 2018-11-07 MED ORDER — DIPHENHYDRAMINE HCL 12.5 MG/5ML PO ELIX: 12.5000 mg | ORAL_SOLUTION | Freq: Four times a day (QID) | ORAL | Status: DC | PRN

## 2018-11-07 MED ORDER — ACETAMINOPHEN 325 MG PO TABS
650.0000 mg | ORAL_TABLET | Freq: Four times a day (QID) | ORAL | Status: DC | PRN
  Administered 2018-11-07: 650 mg via ORAL
  Filled 2018-11-07: qty 2

## 2018-11-07 MED ORDER — MIDAZOLAM HCL 2 MG/2ML IJ SOLN
INTRAMUSCULAR | Status: AC
  Filled 2018-11-07: qty 2

## 2018-11-07 MED ORDER — LIDOCAINE 2% (20 MG/ML) 5 ML SYRINGE
INTRAMUSCULAR | Status: AC
  Filled 2018-11-07: qty 5

## 2018-11-07 MED ORDER — TRAMADOL HCL 50 MG PO TABS: 50.0000 mg | ORAL_TABLET | Freq: Four times a day (QID) | ORAL | Status: DC | PRN

## 2018-11-07 MED ORDER — METOPROLOL SUCCINATE ER 100 MG PO TB24: 100.0000 mg | ORAL_TABLET | Freq: Every day | ORAL | Status: DC

## 2018-11-07 MED ORDER — ONDANSETRON HCL 4 MG/2ML IJ SOLN
INTRAMUSCULAR | Status: AC
  Filled 2018-11-07: qty 2

## 2018-11-07 MED ORDER — ACETAMINOPHEN 500 MG PO TABS
1000.0000 mg | ORAL_TABLET | ORAL | Status: AC
  Administered 2018-11-07: 1000 mg via ORAL

## 2018-11-07 MED ORDER — CEFAZOLIN SODIUM-DEXTROSE 2-4 GM/100ML-% IV SOLN
INTRAVENOUS | Status: AC
  Filled 2018-11-07: qty 100

## 2018-11-07 MED ORDER — METHOCARBAMOL 500 MG PO TABS
500.0000 mg | ORAL_TABLET | Freq: Four times a day (QID) | ORAL | Status: DC | PRN
  Administered 2018-11-07: 500 mg via ORAL
  Filled 2018-11-07: qty 1

## 2018-11-07 MED ORDER — NITROGLYCERIN 0.4 MG SL SUBL: 0.4000 mg | SUBLINGUAL_TABLET | SUBLINGUAL | Status: DC | PRN

## 2018-11-07 MED ORDER — PROPOFOL 10 MG/ML IV BOLUS
INTRAVENOUS | Status: DC | PRN
  Administered 2018-11-07: 160 mg via INTRAVENOUS

## 2018-11-07 MED ORDER — MORPHINE SULFATE (PF) 4 MG/ML IV SOLN: 1.0000 mg | INTRAVENOUS | Status: DC | PRN

## 2018-11-07 MED ORDER — BUPIVACAINE HCL (PF) 0.25 % IJ SOLN
INTRAMUSCULAR | Status: DC | PRN
  Administered 2018-11-07 (×2): 30 mL via EPIDURAL

## 2018-11-07 MED ORDER — ONDANSETRON 4 MG PO TBDP: 4.0000 mg | ORAL_TABLET | Freq: Four times a day (QID) | ORAL | Status: DC | PRN

## 2018-11-07 MED ORDER — ROSUVASTATIN CALCIUM 20 MG PO TABS
20.0000 mg | ORAL_TABLET | Freq: Every day | ORAL | Status: DC
  Administered 2018-11-07: 20 mg via ORAL

## 2018-11-07 MED ORDER — ACETAMINOPHEN 500 MG PO TABS
ORAL_TABLET | ORAL | Status: AC
  Filled 2018-11-07: qty 2

## 2018-11-07 MED ORDER — MONTELUKAST SODIUM 10 MG PO TABS: 10.0000 mg | ORAL_TABLET | Freq: Every day | ORAL | Status: DC

## 2018-11-07 MED ORDER — DIPHENHYDRAMINE HCL 50 MG/ML IJ SOLN
12.5000 mg | Freq: Four times a day (QID) | INTRAMUSCULAR | Status: DC | PRN
  Filled 2018-11-07: qty 1

## 2018-11-07 MED ORDER — PHENYLEPHRINE HCL 10 MG/ML IJ SOLN
INTRAMUSCULAR | Status: AC
  Filled 2018-11-07: qty 1

## 2018-11-07 MED ORDER — BACLOFEN 10 MG PO TABS: 10.0000 mg | ORAL_TABLET | Freq: Three times a day (TID) | ORAL | Status: DC | PRN

## 2018-11-07 MED ORDER — GABAPENTIN 300 MG PO CAPS
300.0000 mg | ORAL_CAPSULE | Freq: Two times a day (BID) | ORAL | Status: DC
  Administered 2018-11-07: 300 mg via ORAL
  Filled 2018-11-07: qty 1

## 2018-11-07 MED ORDER — CHLORHEXIDINE GLUCONATE CLOTH 2 % EX PADS: 6.0000 | MEDICATED_PAD | Freq: Once | CUTANEOUS | Status: DC

## 2018-11-07 MED ORDER — AMLODIPINE BESYLATE 10 MG PO TABS: 10.0000 mg | ORAL_TABLET | Freq: Every day | ORAL | Status: DC

## 2018-11-07 MED ORDER — TECHNETIUM TC 99M SULFUR COLLOID FILTERED
1.0000 | Freq: Once | INTRAVENOUS | Status: AC | PRN
  Administered 2018-11-07: 1 via INTRADERMAL

## 2018-11-07 MED ORDER — ONDANSETRON HCL 4 MG/2ML IJ SOLN: 4.0000 mg | Freq: Four times a day (QID) | INTRAMUSCULAR | Status: DC | PRN

## 2018-11-07 MED ORDER — LIDOCAINE 2% (20 MG/ML) 5 ML SYRINGE
INTRAMUSCULAR | Status: DC | PRN
  Administered 2018-11-07: 40 mg via INTRAVENOUS

## 2018-11-07 SURGICAL SUPPLY — 81 items
ADH SKN CLS APL DERMABOND .7 (GAUZE/BANDAGES/DRESSINGS) ×4
BAG DECANTER FOR FLEXI CONT (MISCELLANEOUS) ×1 IMPLANT
BINDER BREAST LRG (GAUZE/BANDAGES/DRESSINGS) IMPLANT
BINDER BREAST MEDIUM (GAUZE/BANDAGES/DRESSINGS) IMPLANT
BINDER BREAST XLRG (GAUZE/BANDAGES/DRESSINGS) IMPLANT
BINDER BREAST XXLRG (GAUZE/BANDAGES/DRESSINGS) IMPLANT
BIOPATCH RED 1 DISK 7.0 (GAUZE/BANDAGES/DRESSINGS) IMPLANT
BLADE HEX COATED 2.75 (ELECTRODE) ×2 IMPLANT
BLADE SURG 10 STRL SS (BLADE) ×2 IMPLANT
BLADE SURG 15 STRL LF DISP TIS (BLADE) ×1 IMPLANT
BLADE SURG 15 STRL SS (BLADE) ×2
CANISTER SUCT 1200ML W/VALVE (MISCELLANEOUS) ×3 IMPLANT
CHLORAPREP W/TINT 26ML (MISCELLANEOUS) ×2 IMPLANT
CLIP VESOCCLUDE LG 6/CT (CLIP) IMPLANT
CLIP VESOCCLUDE MED 6/CT (CLIP) ×4 IMPLANT
CLIP VESOCCLUDE SM WIDE 6/CT (CLIP) IMPLANT
COVER MAYO STAND STRL (DRAPES) IMPLANT
COVER PROBE W GEL 5X96 (DRAPES) ×2 IMPLANT
COVER WAND RF STERILE (DRAPES) IMPLANT
DECANTER SPIKE VIAL GLASS SM (MISCELLANEOUS) IMPLANT
DERMABOND ADVANCED (GAUZE/BANDAGES/DRESSINGS) ×4
DERMABOND ADVANCED .7 DNX12 (GAUZE/BANDAGES/DRESSINGS) ×1 IMPLANT
DRAIN CHANNEL 19F RND (DRAIN) ×3 IMPLANT
DRAPE UTILITY XL STRL (DRAPES) ×3 IMPLANT
DRSG PAD ABDOMINAL 8X10 ST (GAUZE/BANDAGES/DRESSINGS) ×7 IMPLANT
ELECT BLADE 4.0 EZ CLEAN MEGAD (MISCELLANEOUS) ×2
ELECT REM PT RETURN 9FT ADLT (ELECTROSURGICAL) ×2
ELECTRODE BLDE 4.0 EZ CLN MEGD (MISCELLANEOUS) IMPLANT
ELECTRODE REM PT RTRN 9FT ADLT (ELECTROSURGICAL) ×1 IMPLANT
EVACUATOR SILICONE 100CC (DRAIN) ×3 IMPLANT
GAUZE SPONGE 4X4 12PLY STRL (GAUZE/BANDAGES/DRESSINGS) ×2 IMPLANT
GLOVE BIO SURGEON STRL SZ 6 (GLOVE) ×2 IMPLANT
GLOVE BIOGEL M STRL SZ7.5 (GLOVE) ×1 IMPLANT
GLOVE BIOGEL PI IND STRL 6.5 (GLOVE) ×1 IMPLANT
GLOVE BIOGEL PI IND STRL 7.0 (GLOVE) IMPLANT
GLOVE BIOGEL PI IND STRL 8 (GLOVE) IMPLANT
GLOVE BIOGEL PI INDICATOR 6.5 (GLOVE) ×1
GLOVE BIOGEL PI INDICATOR 7.0 (GLOVE) ×2
GLOVE BIOGEL PI INDICATOR 8 (GLOVE) ×1
GLOVE ECLIPSE 6.5 STRL STRAW (GLOVE) ×1 IMPLANT
GLOVE EXAM NITRILE MD LF STRL (GLOVE) ×1 IMPLANT
GLOVE SURG SYN 7.5  E (GLOVE) ×1
GLOVE SURG SYN 7.5 E (GLOVE) ×1 IMPLANT
GLOVE SURG SYN 7.5 PF PI (GLOVE) IMPLANT
GOWN STRL REUS W/ TWL LRG LVL3 (GOWN DISPOSABLE) ×1 IMPLANT
GOWN STRL REUS W/ TWL XL LVL3 (GOWN DISPOSABLE) IMPLANT
GOWN STRL REUS W/TWL 2XL LVL3 (GOWN DISPOSABLE) ×2 IMPLANT
GOWN STRL REUS W/TWL LRG LVL3 (GOWN DISPOSABLE) ×4
GOWN STRL REUS W/TWL XL LVL3 (GOWN DISPOSABLE) ×4
LIGHT WAVEGUIDE WIDE FLAT (MISCELLANEOUS) ×1 IMPLANT
NDL HYPO 25X1 1.5 SAFETY (NEEDLE) ×1 IMPLANT
NDL SAFETY ECLIPSE 18X1.5 (NEEDLE) ×1 IMPLANT
NDL SPNL 18GX3.5 QUINCKE PK (NEEDLE) IMPLANT
NDL SPNL 22GX3.5 QUINCKE BK (NEEDLE) IMPLANT
NEEDLE HYPO 18GX1.5 SHARP (NEEDLE)
NEEDLE HYPO 25X1 1.5 SAFETY (NEEDLE) ×4 IMPLANT
NEEDLE SPNL 18GX3.5 QUINCKE PK (NEEDLE) IMPLANT
NEEDLE SPNL 22GX3.5 QUINCKE BK (NEEDLE) IMPLANT
NS IRRIG 1000ML POUR BTL (IV SOLUTION) ×3 IMPLANT
PACK BASIN DAY SURGERY FS (CUSTOM PROCEDURE TRAY) ×2 IMPLANT
PACK UNIVERSAL I (CUSTOM PROCEDURE TRAY) ×2 IMPLANT
PENCIL BUTTON HOLSTER BLD 10FT (ELECTRODE) ×2 IMPLANT
PIN SAFETY STERILE (MISCELLANEOUS) ×2 IMPLANT
SLEEVE SCD COMPRESS KNEE MED (MISCELLANEOUS) ×2 IMPLANT
SPONGE LAP 18X18 RF (DISPOSABLE) ×4 IMPLANT
STAPLER VISISTAT 35W (STAPLE) IMPLANT
STOCKINETTE IMPERVIOUS LG (DRAPES) IMPLANT
STRIP CLOSURE SKIN 1/2X4 (GAUZE/BANDAGES/DRESSINGS) ×4 IMPLANT
SUT ETHILON 2 0 FS 18 (SUTURE) ×1 IMPLANT
SUT MNCRL AB 4-0 PS2 18 (SUTURE) ×8 IMPLANT
SUT SILK 0 TIES 10X30 (SUTURE) ×2 IMPLANT
SUT SILK 2 0 SH (SUTURE) IMPLANT
SUT VICRYL 3-0 CR8 SH (SUTURE) ×9 IMPLANT
SUT VICRYL AB 2 0 TIE (SUTURE) ×1 IMPLANT
SUT VICRYL AB 2 0 TIES (SUTURE) ×2
SYR BULB IRRIGATION 50ML (SYRINGE) ×2 IMPLANT
SYR CONTROL 10ML LL (SYRINGE) ×3 IMPLANT
TOWEL GREEN STERILE FF (TOWEL DISPOSABLE) ×2 IMPLANT
TUBE CONNECTING 20X1/4 (TUBING) ×2 IMPLANT
UNDERPAD 30X30 (UNDERPADS AND DIAPERS) ×2 IMPLANT
YANKAUER SUCT BULB TIP NO VENT (SUCTIONS) ×2 IMPLANT

## 2018-11-07 NOTE — Anesthesia Procedure Notes (Signed)
Procedure Name: LMA Insertion Performed by: Pearson, Donna W, CRNA Pre-anesthesia Checklist: Patient identified, Emergency Drugs available, Suction available and Patient being monitored Patient Re-evaluated:Patient Re-evaluated prior to induction Oxygen Delivery Method: Circle system utilized Preoxygenation: Pre-oxygenation with 100% oxygen Induction Type: IV induction Ventilation: Mask ventilation without difficulty LMA: LMA inserted LMA Size: 4.0 Number of attempts: 1 Placement Confirmation: positive ETCO2 Tube secured with: Tape Dental Injury: Teeth and Oropharynx as per pre-operative assessment        

## 2018-11-07 NOTE — Anesthesia Procedure Notes (Addendum)
Anesthesia Regional Block: Pectoralis block   Pre-Anesthetic Checklist: ,, timeout performed, Correct Patient, Correct Site, Correct Laterality, Correct Procedure, Correct Position, site marked, Risks and benefits discussed,  Surgical consent,  Pre-op evaluation,  At surgeon's request and post-op pain management  Laterality: Right  Prep: Maximum Sterile Barrier Precautions used, chloraprep       Needles:  Injection technique: Single-shot  Needle Type: Echogenic Stimulator Needle     Needle Length: 10cm      Additional Needles:   Procedures:,,,, ultrasound used (permanent image in chart),,,,  Narrative:  Start time: 11/07/2018 8:27 AM End time: 11/07/2018 8:30 AM Injection made incrementally with aspirations every 5 mL.  Performed by: Personally  Anesthesiologist: Montez Hageman, MD  Additional Notes: Risks, benefits and alternative to block explained extensively.  Patient tolerated procedure well, without complications.

## 2018-11-07 NOTE — Anesthesia Postprocedure Evaluation (Signed)
Anesthesia Post Note  Patient: Teffany Blaszczyk  Procedure(s) Performed: BILATERAL MASTECTOMIES (Bilateral Breast)     Patient location during evaluation: PACU Anesthesia Type: General Level of consciousness: awake and alert Pain management: pain level controlled Vital Signs Assessment: post-procedure vital signs reviewed and stable Respiratory status: spontaneous breathing, nonlabored ventilation, respiratory function stable and patient connected to nasal cannula oxygen Cardiovascular status: blood pressure returned to baseline and stable Postop Assessment: no apparent nausea or vomiting Anesthetic complications: no    Last Vitals:  Vitals:   11/07/18 1245 11/07/18 1300  BP: (!) 116/55 (!) 128/109  Pulse: (!) 56 (!) 56  Resp: 17 15  Temp:    SpO2: 100% 100%    Last Pain:  Vitals:   11/07/18 1245  TempSrc:   PainSc: 7                  Montez Hageman

## 2018-11-07 NOTE — Progress Notes (Signed)
Assisted Dr. Marcell Barlow with right, left, pectoralis block. Also assisted nuc med tech with nuc med inj Side rails up, monitors on throughout procedure. See vital signs in flow sheet. Tolerated Procedure well.

## 2018-11-07 NOTE — Op Note (Signed)
Bilateral Mastectomies  Indications: This patient presents with history of recurrent left breast cancer, cTis  Pre-operative Diagnosis: left breast cancer, cTis, upper outer quadrant, receptors ER/PR positive  Post-operative Diagnosis: same  Surgeon: Stark Klein   Anesthesia: General endotracheal anesthesia and pectoral block  ASA Class: 3  Procedure Details  The patient was seen in the Holding Room. The risks, benefits, complications, treatment options, and expected outcomes were discussed with the patient. The possibilities of reaction to medication, pulmonary aspiration, bleeding, infection, the need for additional procedures, failure to diagnose a condition, and creating a complication requiring transfusion or operation were discussed with the patient. The patient concurred with the proposed plan, giving informed consent.  The site of surgery properly noted/marked. The patient was taken to Operating Room # 8, identified as Brenda [name] and the procedure verified as Bilateral Mastectomy and left Sentinel Node Biopsy. A Time Out was held and the above information confirmed.  The methylene blue was injected in the subareolar location.    After induction of anesthesia, the bilateral breast and chest were prepped and draped in standard fashion.  The right side was addressed first.    The borders of the breast were identified and marked.  The incisions of the breast was drawn out to make sure incision lines were equidistant in length.    The superior incision was made with the #10 blade.  Mastectomy hooks were used to provide elevation of the skin edges, and the cautery was used to create the mastectomy flaps.  The dissection was taken to the fascia of the pectoralis major.  The penetrating vessels were clipped.  The superior flap was taken medially to the lateral sternal border, superiorly to the inferior border of the clavicle.  The inferior flap was similarly created, inferiorly to the  inframammary fold and laterally to the border of the latissimus.  The breast was taken off including the pectoralis fascia and the axillary tail marked.    Hemostasis was achieved with the cautery.  The area was irrigated.  A 18 Fr blake was placed in the chest wall and secured with a 2-0 nylon.  The skin was reapproximated with 3-0 vicryl deep dermal sutures and 4--0 monocryl running subcuticular suture.    The left side was then addressed.  Her previous left upper breast was stuck down to the pectoralis fascia and the incisions were in the upper breast.  The superior incision had to be angled higher and the lower incision had to cheat up toward the nipple.  The dissection was carried up to the clavicle, medial to the lateral sternum, lateral to the latissimus and inferiorly to the serratus anterior at the inframammary fold.  Using a hand-held gamma probe, the axilla was examined.  There was no elevated signal and no blue dye.  There was minimal axillary tissue left from the previous surgery.         The wound was irrigated. One 19 Blake drain was placed laterally.   Hemostasis was achieved with cautery.  The wound was irrigated and closed with a 3-0 Vicryl deep dermal interrupted sutures and 4-0 Vicryl subcuticular closure in layers.    Sterile dressings were applied. At the end of the operation, all sponge, instrument, and needle counts were correct.  Findings: grossly clear surgical margins, previous incisions on left were excised.    Estimated Blood Loss: 50 mL          Drains: 19 Fr blake drain bilaterally  Specimens: right breast and left breast         Complications:  None; patient tolerated the procedure well.         Disposition: PACU - hemodynamically stable.         Condition: stable

## 2018-11-07 NOTE — Anesthesia Procedure Notes (Addendum)
Anesthesia Regional Block: Pectoralis block   Pre-Anesthetic Checklist: ,, timeout performed, Correct Patient, Correct Site, Correct Laterality, Correct Procedure, Correct Position, site marked, Risks and benefits discussed,  Surgical consent,  Pre-op evaluation,  At surgeon's request and post-op pain management  Laterality: Left  Prep: Maximum Sterile Barrier Precautions used, chloraprep       Needles:  Injection technique: Single-shot  Needle Type: Echogenic Stimulator Needle     Needle Length: 10cm      Additional Needles:   Procedures:,,,, ultrasound used (permanent image in chart),,,,  Narrative:  Start time: 11/07/2018 8:20 AM End time: 11/07/2018 8:25 AM Injection made incrementally with aspirations every 5 mL.  Performed by: Personally  Anesthesiologist: Montez Hageman, MD  Additional Notes: Risks, benefits and alternative to block explained extensively.  Patient tolerated procedure well, without complications.

## 2018-11-07 NOTE — Anesthesia Preprocedure Evaluation (Signed)
Anesthesia Evaluation  Patient identified by MRN, date of birth, ID band Patient awake    Reviewed: Allergy & Precautions, NPO status , Patient's Chart, lab work & pertinent test results  Airway Mallampati: II  TM Distance: >3 FB Neck ROM: Full    Dental no notable dental hx.    Pulmonary neg pulmonary ROS, former smoker,    Pulmonary exam normal breath sounds clear to auscultation       Cardiovascular hypertension, Pt. on medications + CAD, + CABG, + Peripheral Vascular Disease and +CHF  Normal cardiovascular exam Rhythm:Regular Rate:Normal     Neuro/Psych Anxiety Depression CVA, Residual Symptoms    GI/Hepatic negative GI ROS, Neg liver ROS,   Endo/Other  negative endocrine ROS  Renal/GU negative Renal ROS  negative genitourinary   Musculoskeletal negative musculoskeletal ROS (+)   Abdominal   Peds negative pediatric ROS (+)  Hematology negative hematology ROS (+)   Anesthesia Other Findings   Reproductive/Obstetrics negative OB ROS                             Anesthesia Physical Anesthesia Plan  ASA: III  Anesthesia Plan: General   Post-op Pain Management:  Regional for Post-op pain   Induction: Intravenous  PONV Risk Score and Plan: 3 and Ondansetron, Dexamethasone and Treatment may vary due to age or medical condition  Airway Management Planned: LMA  Additional Equipment:   Intra-op Plan:   Post-operative Plan: Extubation in OR  Informed Consent: I have reviewed the patients History and Physical, chart, labs and discussed the procedure including the risks, benefits and alternatives for the proposed anesthesia with the patient or authorized representative who has indicated his/her understanding and acceptance.     Dental advisory given  Plan Discussed with: CRNA  Anesthesia Plan Comments:         Anesthesia Quick Evaluation

## 2018-11-07 NOTE — Transfer of Care (Signed)
Immediate Anesthesia Transfer of Care Note  Patient: Brenda [name]  Procedure(s) Performed: BILATERAL MASTECTOMIES (Bilateral Breast)  Patient Location: PACU  Anesthesia Type:General and Regional  Level of Consciousness: awake and sedated  Airway & Oxygen Therapy: Patient Spontanous Breathing and Patient connected to face mask oxygen  Post-op Assessment: Report given to RN and Post -op Vital signs reviewed and stable  Post vital signs: Reviewed and stable  Last Vitals:  Vitals Value Taken Time  BP 141/61 11/07/2018 12:15 PM  Temp    Pulse 57 11/07/2018 12:18 PM  Resp 14 11/07/2018 12:18 PM  SpO2 100 % 11/07/2018 12:18 PM  Vitals shown include unvalidated device data.  Last Pain:  Vitals:   11/07/18 0734  TempSrc: Oral  PainSc: 0-No pain         Complications: No apparent anesthesia complications

## 2018-11-07 NOTE — Interval H&P Note (Signed)
History and Physical Interval Note:  11/07/2018 7:56 AM  Brenda [name]  has presented today for surgery, with the diagnosis of RECURRENT LEFT BREAST CANCER  The various methods of treatment have been discussed with the patient and family. After consideration of risks, benefits and other options for treatment, the patient has consented to  Procedure(s): BILATERAL MASTECTOMIES WITH LEFT SENTINEL LYMPH NODE BIOPSY (Bilateral) as a surgical intervention .  The patient's history has been reviewed, patient examined, no change in status, stable for surgery.  I have reviewed the patient's chart and labs.  Questions were answered to the patient's satisfaction.     Stark Klein

## 2018-11-07 NOTE — Discharge Instructions (Signed)
[facility] [place] °[phone] ° °MASTECTOMY: POST OP INSTRUCTIONS ° °Always review your discharge instruction sheet given to you by the facility where your surgery was performed. °IF YOU HAVE DISABILITY OR FAMILY LEAVE FORMS, YOU MUST BRING THEM TO THE OFFICE FOR PROCESSING.   °DO NOT GIVE THEM TO YOUR DOCTOR. °A prescription for pain medication may be given to you upon discharge.  Take your pain medication as prescribed, if needed.  If narcotic pain medicine is not needed, then you may take acetaminophen (Tylenol) or ibuprofen (Advil) as needed. °1. Take your usually prescribed medications unless otherwise directed. °2. If you need a refill on your pain medication, please contact your pharmacy.  They will contact our office to request authorization.  Prescriptions will not be filled after 5pm or on week-ends. °3. You should follow a light diet the first few days after arrival home, such as soup and crackers, etc.  Resume your normal diet the day after surgery. °4. Most patients will experience some swelling and bruising on the chest and underarm.  Ice packs will help.  Swelling and bruising can take several days to resolve.  °5. It is common to experience some constipation if taking pain medication after surgery.  Increasing fluid intake and taking a stool softener (such as Colace) will usually help or prevent this problem from occurring.  A mild laxative (Milk of Magnesia or Miralax) should be taken according to package instructions if there are no bowel movements after 48 hours. °6. Unless discharge instructions indicate otherwise, leave your bandage dry and in place until your next appointment in 3-5 days.  You may take a limited sponge bath.  No tube baths or showers until the drains are removed.  You may have steri-strips (small skin tapes) in place directly over the incision.  These strips should be left on the skin for 7-10 days.  If your surgeon used skin glue on the incision, you may  shower in 24 hours.  The glue will flake off over the next 2-3 weeks.  Any sutures or staples will be removed at the office during your follow-up visit. °7. DRAINS:  If you have drains in place, it is important to keep a list of the amount of drainage produced each day in your drains.  Before leaving the hospital, you should be instructed on drain care.  Call our office if you have any questions about your drains. °8. ACTIVITIES:  You may resume regular (light) daily activities beginning the next day--such as daily self-care, walking, climbing stairs--gradually increasing activities as tolerated.  You may have sexual intercourse when it is comfortable.  Refrain from any heavy lifting or straining until approved by your doctor. °a. You may drive when you are no longer taking prescription pain medication, you can comfortably wear a seatbelt, and you can safely maneuver your car and apply brakes. °b. RETURN TO WORK:  __________________________________________________________ °9. You should see your doctor in the office for a follow-up appointment approximately 3-5 days after your surgery.  Your doctor’s nurse will typically make your follow-up appointment when she calls you with your pathology report.  Expect your pathology report 2-3 business days after your surgery.  You may call to check if you do not hear from us after three days.   °10. OTHER INSTRUCTIONS: ______________________________________________________________________________________________ ____________________________________________________________________________________________ °WHEN TO CALL YOUR DOCTOR: °1. Fever over 101.0 °2. Nausea and/or vomiting °3. Extreme swelling or bruising °4. Continued bleeding from incision. °5. Increased pain, redness, or drainage from the incision. °  The clinic staff is available to answer your questions during regular business hours.  Please don’t hesitate to call and ask to speak to one of the nurses for clinical  concerns.  If you have a medical emergency, go to the nearest emergency room or call 911.  A surgeon from [facility] is always on call at the hospital. °[address], [address], [place], [place]  [address] ? [address] [address], , [place]   [address] °[phone] ? [phone] ? [phone] °Web site: [email or web address] °

## 2018-11-08 ENCOUNTER — Encounter (HOSPITAL_BASED_OUTPATIENT_CLINIC_OR_DEPARTMENT_OTHER): Payer: Self-pay | Admitting: General Surgery

## 2018-11-08 DIAGNOSIS — D0512 Intraductal carcinoma in situ of left breast: Secondary | ICD-10-CM | POA: Diagnosis not present

## 2018-11-08 NOTE — Addendum Note (Signed)
Addendum  created 11/08/18 1135 by Blocker, Ernesta Amble, CRNA   Charge Capture section accepted

## 2018-11-12 NOTE — Discharge Summary (Signed)
Physician Discharge Summary  Patient ID: Brenda [name] MRN: [id number] DOB/AGE: 65-28-1957 65 y.o.  Admit date: 11/07/2018 Discharge date: 11/12/2018  Admission Diagnoses: Patient Active Problem List   Diagnosis Date Noted  . Breast cancer, stage 0, left 11/07/2018  . Monoallelic mutation of PALB2 gene 10/25/2018  . Genetic testing 10/25/2018  . Malignant neoplasm of central portion of left breast in female, estrogen receptor negative ([place]) 10/08/2018  . 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 Family history of lung cancer   . Loss of transverse plantar arch of left foot 07/19/2018  . Diarrhea 04/25/2018  . Hypothyroidism 03/02/2018  . Elevated troponin   . Chest pain 02/20/2018  . Hypertensive emergency 02/19/2018  . Lesion of pancreas 06/22/2017  . Constipation 06/22/2017  . Peripheral vascular disease ([place]) 06/01/2017  . Left leg pain 05/10/2017  . Allergic rhinitis 02/25/2016  . Low back pain 09/29/2015  . Carpal tunnel syndrome, right 08/20/2015  . Hx of completed stroke 05/21/2015  . Memory impairment 05/21/2015  . Hyperthyroidism 12/29/2008  . HYPERCHOLESTEROLEMIA 12/29/2008  . Anxiety 12/29/2008  . Essential hypertension 12/29/2008  . Coronary atherosclerosis 12/29/2008  . CAROTID ARTERY STENOSIS 12/29/2008    Discharge Diagnoses:  Active Problems:   Breast cancer, stage 0, left   Discharged Condition: stable  Hospital Course:  Patient was admitted for observation overnight following bilateral mastectomies.  She did well overnight.  She had reasonable drain output.  Pain was controlled with oral medication.  She was able to eat and family demonstrated good drain management.  She was discharged home in stable condition.  Consults: None  Significant Diagnostic Studies: none  Treatments: surgery: see above  Discharge Exam: Blood pressure 96/61, pulse 68, temperature 98.8 F (37.1 C), resp. rate 16, height 5' 5"  (1.651 m), weight 71.9 kg,  SpO2 99 %. General appearance: alert, cooperative and no distress Breasts: surgically absent.  no flap hematomas Extremities: extremities normal, atraumatic, no cyanosis or edema  Disposition:   Discharge Instructions    Call MD for:  difficulty breathing, headache or visual disturbances   Complete by:  As directed    Call MD for:  persistant dizziness or light-headedness   Complete by:  As directed    Call MD for:  persistant nausea and vomiting   Complete by:  As directed    Call MD for:  redness, tenderness, or signs of infection (pain, swelling, redness, odor or green/yellow discharge around incision site)   Complete by:  As directed    Call MD for:  severe uncontrolled pain   Complete by:  As directed    Call MD for:  temperature >100.4   Complete by:  As directed    Diet - low sodium heart healthy   Complete by:  As directed    Increase activity slowly   Complete by:  As directed      Allergies as of 11/08/2018      Reactions   Adhesive [tape] Itching      Medication List    TAKE these medications   amLODipine 10 MG tablet Commonly known as:  NORVASC Take 1 tablet (10 mg total) by mouth daily.   aspirin 81 MG tablet Take 1 tablet (81 mg total) by mouth daily. With Food   clopidogrel 75 MG tablet Commonly known as:  PLAVIX TAKE 1 TABLET BY MOUTH ONCE DAILY   isosorbide mononitrate 30 MG 24 hr tablet Commonly known as:  IMDUR Take 1 tablet (30 mg  total) by mouth daily.   lisinopril-hydrochlorothiazide 20-25 MG tablet Commonly known as:  PRINZIDE,ZESTORETIC Take 1 tablet by mouth daily.   metoprolol succinate 100 MG 24 hr tablet Commonly known as:  TOPROL-XL Take 1 tablet (100 mg total) by mouth daily. Take with or immediately following a meal.   montelukast 10 MG tablet Commonly known as:  SINGULAIR Take 1 tablet (10 mg total) by mouth at bedtime.   nitroGLYCERIN 0.4 MG SL tablet Commonly known as:  NITROSTAT 1 tablet under tongue every 5 minutes as  needed for chest discomfort (max 2 Tab/day)   ONE-A-DAY WOMENS PO Take 1 tablet by mouth daily.   OVER THE COUNTER MEDICATION Take 1 tablet by mouth See admin instructions. Unnamed Back Pain OTC medication: Take 1 tablet by mouth every six hours as needed for back pain   oxyCODONE 5 MG immediate release tablet Commonly known as:  Oxy IR/ROXICODONE Take 1 tablet (5 mg total) by mouth every 6 (six) hours as needed for severe pain.   Potassium Chloride ER 20 MEQ Tbcr Take 1 tablet daily on Monday Wednesdays Fridays and Sundays   REFRESH PLUS OP Place 1-2 drops into both eyes as needed (for dryness or irritation only when wearing contacts).   rosuvastatin 20 MG tablet Commonly known as:  CRESTOR Take 20 mg by mouth daily.   venlafaxine XR 37.5 MG 24 hr capsule Commonly known as:  EFFEXOR-XR TAKE 1 CAPSULE BY MOUTH ONCE DAILY WITH BREAKFAST   Vitamin D-3 25 MCG (1000 UT) Caps Take 2,000 Units by mouth daily.      Follow-up Information    Stark Klein, MD In 2 weeks.   Specialty:  General Surgery Contact information: [address] [place] [address] [phone]           Signed: Stark Klein 11/12/2018, 9:32 AM

## 2018-11-13 NOTE — Progress Notes (Signed)
Please let patient know that there is no residual cancer

## 2018-11-15 ENCOUNTER — Ambulatory Visit (HOSPITAL_COMMUNITY)
Admission: EM | Admit: 2018-11-15 | Discharge: 2018-11-15 | Disposition: A | Discharge status: Home / Self Care | Payer: Medicare Other | Attending: Family Medicine | Admitting: Family Medicine

## 2018-11-15 ENCOUNTER — Ambulatory Visit (INDEPENDENT_AMBULATORY_CARE_PROVIDER_SITE_OTHER): Payer: Medicare Other

## 2018-11-15 ENCOUNTER — Encounter (HOSPITAL_COMMUNITY): Payer: Self-pay | Admitting: Emergency Medicine

## 2018-11-15 DIAGNOSIS — R109 Unspecified abdominal pain: Secondary | ICD-10-CM

## 2018-11-15 DIAGNOSIS — R1030 Lower abdominal pain, unspecified: Secondary | ICD-10-CM | POA: Diagnosis not present

## 2018-11-15 MED ORDER — KETOROLAC TROMETHAMINE 30 MG/ML IJ SOLN
INTRAMUSCULAR | Status: AC
  Filled 2018-11-15: qty 1

## 2018-11-15 MED ORDER — SIMETHICONE 80 MG PO CHEW: 80.0000 mg | CHEWABLE_TABLET | Freq: Four times a day (QID) | ORAL | 0 refills | Status: DC | PRN

## 2018-11-15 MED ORDER — KETOROLAC TROMETHAMINE 30 MG/ML IJ SOLN
30.0000 mg | Freq: Once | INTRAMUSCULAR | Status: AC
  Administered 2018-11-15: 30 mg via INTRAMUSCULAR

## 2018-11-15 NOTE — ED Provider Notes (Signed)
[facility]    CSN: [id number] Arrival date & time: 11/15/18  1627     History   Chief Complaint Chief Complaint  Patient presents with  . Abdominal Cramping    HPI Brenda [name] is a 62 y.o. female.   HPI  Patient had double mastectomy 11/07/2018 for recurrent breast cancer.  Has never had any trouble with anesthesia before.  Has had abdominal crampy pain, decreased appetite, constipation, diarrhea, gas ever since. She called her surgeon who told  Her to try miralax.  She did this and had gas pain and diarrhea so took imodium.  Still with terrible abdominal pain, no appetite.  Loose bowels.  No blood in bowels.  When she finally flet like eating she tried chicken soup.  This worked, so she ate baked beans.  Her surgeon thought the oxycodone might be part of the problem so she was changed to vicodin.  Takes mostly acetaminophen.  No fever or chills.  No prior GI disease, reflux, ulcers, IBS.   Past Medical History:  Diagnosis Date  . Anxiety   . Cancer [facility])    breast cancer  . Carotid artery stenosis   . CHF (congestive heart failure) ([place])   . Coronary artery disease 2001  . Depression   . Family history of lung cancer   . Hx of CABG 2001  . Hypercholesteremia   . Hypertension   . Hyperthyroidism   . Stroke [facility])    short term memory loss    Patient Active Problem List   Diagnosis Date Noted  . Breast cancer, stage 0, left 11/07/2018  . Monoallelic mutation of PALB2 gene 10/25/2018  . Genetic testing 10/25/2018  . Malignant neoplasm of central portion of left breast in female, estrogen receptor negative ([place]) 10/08/2018  . 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 Family history of lung cancer   . Loss of transverse plantar arch of left foot 07/19/2018  . Diarrhea 04/25/2018  . Hypothyroidism 03/02/2018  . Elevated troponin   . Chest pain 02/20/2018  . Hypertensive emergency 02/19/2018  . Lesion of pancreas 06/22/2017  .  Constipation 06/22/2017  . Peripheral vascular disease ([place]) 06/01/2017  . Left leg pain 05/10/2017  . Allergic rhinitis 02/25/2016  . Low back pain 09/29/2015  . Carpal tunnel syndrome, right 08/20/2015  . Hx of completed stroke 05/21/2015  . Memory impairment 05/21/2015  . Hyperthyroidism 12/29/2008  . HYPERCHOLESTEROLEMIA 12/29/2008  . Anxiety 12/29/2008  . Essential hypertension 12/29/2008  . Coronary atherosclerosis 12/29/2008  . CAROTID ARTERY STENOSIS 12/29/2008    Past Surgical History:  Procedure Laterality Date  . ABDOMINAL AORTOGRAM W/LOWER EXTREMITY N/A 06/13/2017   Procedure: ABDOMINAL AORTOGRAM W/LOWER EXTREMITY;  Surgeon: Waynetta Sandy, MD;  Location: [place] [facility];  Service: Cardiovascular;  Laterality: N/A;  Bilateral  . ABDOMINAL HYSTERECTOMY    . BREAST LUMPECTOMY  1999  . BREAST LUMPECTOMY     left breast  . CAROTID ENDARTERECTOMY Left 04/2000  . CORONARY ARTERY BYPASS GRAFT  2001   x 5 (left internal mammary artery to left anterior  descending coronary artery  . LEFT HEART CATH AND CORS/GRAFTS ANGIOGRAPHY N/A 02/21/2018   Procedure: LEFT HEART CATH AND CORS/GRAFTS ANGIOGRAPHY;  Surgeon: Lorretta Harp, MD;  Location: [place] [facility];  Service: Cardiovascular;  Laterality: N/A;  . PERIPHERAL VASCULAR INTERVENTION  06/13/2017   Procedure: PERIPHERAL VASCULAR INTERVENTION;  Surgeon: Waynetta Sandy, MD;  Location: [place] [facility];  Service: Cardiovascular;;  Bilateral Iliac Stents  . TOTAL MASTECTOMY Bilateral 11/07/2018   Procedure: BILATERAL MASTECTOMIES;  Surgeon: Stark Klein, MD;  Location: [place];  Service: General;  Laterality: Bilateral;    OB History   No obstetric history on file.      Home Medications    Prior to Admission medications   Medication Sig Start Date End Date Taking? Authorizing Provider  HYDROcodone-acetaminophen (NORCO/VICODIN) 5-325 MG tablet Take 1 tablet by mouth every 6  (six) hours as needed for moderate pain.   Yes [provider]  amLODipine (NORVASC) 10 MG tablet Take 1 tablet (10 mg total) by mouth daily. 11/04/18   Lelon Perla, MD  aspirin 81 MG tablet Take 1 tablet (81 mg total) by mouth daily. With Food 03/02/18   Roxan Hockey, MD  Carboxymethylcellulose Sodium (REFRESH PLUS OP) Place 1-2 drops into both eyes as needed (for dryness or irritation only when wearing contacts).    [provider]  Cholecalciferol (VITAMIN D-3) 1000 units CAPS Take 2,000 Units by mouth daily.    [provider]  isosorbide mononitrate (IMDUR) 30 MG 24 hr tablet Take 1 tablet (30 mg total) by mouth daily. 03/02/18 03/02/19  Roxan Hockey, MD  lisinopril-hydrochlorothiazide (PRINZIDE,ZESTORETIC) 20-25 MG tablet Take 1 tablet by mouth daily. 06/26/18   Lelon Perla, MD  metoprolol succinate (TOPROL-XL) 100 MG 24 hr tablet Take 1 tablet (100 mg total) by mouth daily. Take with or immediately following a meal. 06/13/18   Crenshaw, Denice Bors, MD  montelukast (SINGULAIR) 10 MG tablet Take 1 tablet (10 mg total) by mouth at bedtime. 07/30/18   [name], MD  Multiple Vitamins-Calcium (ONE-A-DAY WOMENS PO) Take 1 tablet by mouth daily.    [provider]  nitroGLYCERIN (NITROSTAT) 0.4 MG SL tablet 1 tablet under tongue every 5 minutes as needed for chest discomfort (max 2 Tab/day) 03/02/18   Roxan Hockey, MD  OVER THE COUNTER MEDICATION Take 1 tablet by mouth See admin instructions. Unnamed Back Pain OTC medication: Take 1 tablet by mouth every six hours as needed for back pain    [provider]  Potassium Chloride ER 20 MEQ TBCR Take 1 tablet daily on Monday Wednesdays Fridays and Sundays 04/25/18   [name], MD  rosuvastatin (CRESTOR) 20 MG tablet Take 20 mg by mouth daily. 02/24/18   [provider]  simethicone (GAS-X) 80 MG chewable tablet Chew 1 tablet (80 mg total) by mouth every 6 (six) hours  as needed for flatulence. 11/15/18   Raylene Everts, MD  venlafaxine XR (EFFEXOR-XR) 37.5 MG 24 hr capsule TAKE 1 CAPSULE BY MOUTH ONCE DAILY WITH BREAKFAST 04/25/18   Lyndal Pulley, DO    Family History Family History  Problem Relation Age of Onset  . Lung cancer Mother 65       died at an early age  . Other Other        There is no Hx of premature coronary artery disease in her family    Social History Social History   Tobacco Use  . Smoking status: Former Smoker    Types: Cigarettes    Last attempt to quit: 09/11/2001    Years since quitting: 17.1  . Smokeless tobacco: Never Used  Substance Use Topics  . Alcohol use: No  . Drug use: No     Allergies   Adhesive [tape]   Review of Systems Review of Systems  Constitutional: Negative for chills  and fever.  HENT: Negative for ear pain and sore throat.   Eyes: Negative for pain and visual disturbance.  Respiratory: Negative for cough and shortness of breath.   Cardiovascular: Negative for chest pain and palpitations.  Gastrointestinal: Positive for abdominal distention, abdominal pain, constipation and diarrhea. Negative for vomiting.       Belching  Genitourinary: Negative for dysuria and hematuria.  Musculoskeletal: Negative for arthralgias and back pain.  Skin: Negative for color change and rash.  Neurological: Negative for seizures and syncope.  All other systems reviewed and are negative.    Physical Exam Triage Vital Signs ED Triage Vitals  Enc Vitals Group     BP 11/15/18 1635 112/65     Pulse Rate 11/15/18 1635 71     Resp 11/15/18 1635 18     Temp 11/15/18 1635 97.8 F (36.6 C)     Temp src --      SpO2 11/15/18 1635 100 %     Weight --      Height --      Head Circumference --      Peak Flow --      Pain Score 11/15/18 1637 0     Pain Loc --      Pain Edu? --      Excl. in [place]? --    No data found.  Updated Vital Signs BP 112/65   Pulse 71   Temp 97.8 F (36.6 C)   Resp 18   SpO2  100%       Physical Exam Constitutional:      General: She is not in acute distress.    Appearance: She is well-developed and normal weight. She is ill-appearing.  HENT:     Head: Normocephalic and atraumatic.  Eyes:     Conjunctiva/sclera: Conjunctivae normal.     Pupils: Pupils are equal, round, and reactive to light.  Neck:     Musculoskeletal: Normal range of motion.  Cardiovascular:     Rate and Rhythm: Normal rate and regular rhythm.     Heart sounds: Normal heart sounds.  Pulmonary:     Effort: Pulmonary effort is normal. No respiratory distress.     Breath sounds: Normal breath sounds.  Abdominal:     General: There is distension.     Palpations: Abdomen is soft. There is no mass.     Tenderness: There is abdominal tenderness. There is no guarding or rebound.     Comments: Distended, but not firm.  Diminished bowel sounds.  General tenderness to deep palpation  Musculoskeletal: Normal range of motion.  Skin:    General: Skin is warm and dry.  Neurological:     General: No focal deficit present.     Mental Status: She is alert. Mental status is at baseline.  Psychiatric:        Mood and Affect: Mood normal.        Behavior: Behavior normal.      UC Treatments / Results  Labs (all labs ordered are listed, but only abnormal results are displayed) Labs Reviewed - No data to display  EKG None  Radiology Dg Abd 2 Views  Result Date: 11/15/2018 CLINICAL DATA:  Lower abdominal pain for the past 2 weeks. Recent bilateral mastectomy with drains in place. EXAM: ABDOMEN - 2 VIEW COMPARISON:  Abdomen MRI dated 07/04/2018. FINDINGS: Normal bowel gas pattern. Normal amount of stool in the right colon with no significant stool in the remainder the colon. Aorto bi-iliac  stent. Bilateral lower chest surgical drains and clips. Mild lumbar spine degenerative changes. Multiple small punctate pancreatic calcifications. IMPRESSION: No acute abnormality.  Changes of chronic  pancreatitis. Electronically Signed   By: Claudie Revering M.D.   On: 11/15/2018 18:19    Procedures Procedures (including critical care time)  Medications Ordered in UC Medications  ketorolac (TORADOL) 30 MG/ML injection 30 mg (30 mg Intramuscular Given 11/15/18 1844)    Initial Impression / Assessment and Plan / UC Course  I have reviewed the triage vital signs and the nursing notes.  Pertinent labs & imaging results that were available during my care of the patient were reviewed by me and considered in my medical decision making (see chart for details).    With the patient that often after surgery people will have bowel changes.  The anesthesia can cause disturbances.  The opiates cause constipation.  Uncertain why she feels so much gas and belching.  Discussed diet, medication, probiotics, and expect improvement over the next few days.  Final Clinical Impressions(s) / UC Diagnoses   Final diagnoses:  Abdominal cramping     Discharge Instructions     Take simethicone for the gas and bloating Read the attached educational information Take Imodium only if needed for watery diarrhea that is uncontrollable You will probably not take MiraLAX if you are not taking narcotic pain medication I would get a probiotic at the pharmacy and start taking this daily until the problem is under control Return if you get worse instead of better at any time    ED Prescriptions    Medication Sig Dispense Auth. Provider   simethicone (GAS-X) 80 MG chewable tablet Chew 1 tablet (80 mg total) by mouth every 6 (six) hours as needed for flatulence. 30 tablet Raylene Everts, MD     Controlled Substance Prescriptions [place] Controlled Substance Registry consulted? Not Applicable   Raylene Everts, MD 11/15/18 [phone]

## 2018-11-15 NOTE — Discharge Instructions (Signed)
Take simethicone for the gas and bloating Read the attached educational information Take Imodium only if needed for watery diarrhea that is uncontrollable You will probably not take MiraLAX if you are not taking narcotic pain medication I would get a probiotic at the pharmacy and start taking this daily until the problem is under control Return if you get worse instead of better at any time

## 2018-11-15 NOTE — ED Triage Notes (Signed)
Pt c/o abdominal pressure and bloating, pt states she had a double mastectomy and was discharged from the hospital on 2/27. Pt states shes had a bowel movement Wednesday. Denies pain, just pressure. Pt has burped twice during triage. States she called her surgeon and took miralax yesterday and imodium due to some mild diarrhea. Was given oxy and stopped that and started Vicodin instead to see if that would help.

## 2018-11-20 ENCOUNTER — Encounter: Payer: Self-pay | Admitting: Internal Medicine

## 2018-11-20 ENCOUNTER — Ambulatory Visit (INDEPENDENT_AMBULATORY_CARE_PROVIDER_SITE_OTHER): Payer: Medicare Other | Admitting: Internal Medicine

## 2018-11-20 ENCOUNTER — Other Ambulatory Visit: Payer: Self-pay

## 2018-11-20 ENCOUNTER — Other Ambulatory Visit (INDEPENDENT_AMBULATORY_CARE_PROVIDER_SITE_OTHER): Payer: Medicare Other

## 2018-11-20 VITALS — BP 110/50 | HR 61 | Temp 98.2°F | Ht 65.0 in | Wt 151.0 lb

## 2018-11-20 DIAGNOSIS — R109 Unspecified abdominal pain: Secondary | ICD-10-CM

## 2018-11-20 DIAGNOSIS — Z23 Encounter for immunization: Secondary | ICD-10-CM

## 2018-11-20 LAB — COMPREHENSIVE METABOLIC PANEL
ALT: 15 U/L (ref 0–35)
AST: 17 U/L (ref 0–37)
Albumin: 3.9 g/dL (ref 3.5–5.2)
Alkaline Phosphatase: 59 U/L (ref 39–117)
BUN: 19 mg/dL (ref 6–23)
CO2: 30 mEq/L (ref 19–32)
Calcium: 9.5 mg/dL (ref 8.4–10.5)
Chloride: 99 mEq/L (ref 96–112)
Creatinine, Ser: 1.15 mg/dL (ref 0.40–1.20)
GFR: 57.33 mL/min — ABNORMAL LOW (ref >60.00)
Glucose, Bld: 89 mg/dL (ref 70–99)
Potassium: 3.8 mEq/L (ref 3.5–5.1)
Sodium: 136 mEq/L (ref 135–145)
Total Bilirubin: 0.7 mg/dL (ref 0.2–1.2)
Total Protein: 7.3 g/dL (ref 6.0–8.3)

## 2018-11-20 LAB — CBC
HCT: 33.5 % — ABNORMAL LOW (ref 36.0–46.0)
Hemoglobin: 11.4 g/dL — ABNORMAL LOW (ref 12.0–15.0)
MCHC: 34 g/dL (ref 30.0–36.0)
MCV: 94.4 fl (ref 78.0–100.0)
Platelets: 308 10*3/uL (ref 150.0–400.0)
RBC: 3.55 Mil/uL — ABNORMAL LOW (ref 3.87–5.11)
RDW: 13.7 % (ref 11.5–15.5)
WBC: 12.5 10*3/uL — ABNORMAL HIGH (ref 4.0–10.5)

## 2018-11-20 LAB — LIPASE: Lipase: 37 U/L (ref 11.0–59.0)

## 2018-11-20 MED ORDER — HYOSCYAMINE SULFATE 0.125 MG PO TABS: 0.1250 mg | ORAL_TABLET | ORAL | 0 refills | Status: DC | PRN

## 2018-11-20 NOTE — Patient Instructions (Signed)
We have sent in hyoscyamine to use before meals and at bedtime if needed.

## 2018-11-20 NOTE — Progress Notes (Signed)
   Subjective:   Patient ID: Brenda [name], female    DOB: [birth year], 63 y.o.   MRN: [id number]  HPI The patient is a 63 YO female coming in for ER follow up (in for abdominal disturbance after bilateral mastectomy, was having pain, bloating, belching, diarrhea, was advised to take miralax by surgeon and then got the diarrhea, then took imodium which was able to stop the diarrhea). She is still having pain with eating. She has not eaten anything today for fear that this would cause symptoms. Denies vomiting or nausea. Has only been taking tylenol for pain. Denies diarrhea or constipation in the last day or so. Has gas and pain. Has been taking the tylenol and tried gas-x without much relief. Overall stable and not improving. Breast pain is stable and overall drainage is slowing.   PMH, [facility], social history reviewed and updated  Review of Systems  Constitutional: Positive for activity change, appetite change and fatigue.  HENT: Negative.   Eyes: Negative.   Respiratory: Negative for cough, chest tightness and shortness of breath.   Cardiovascular: Negative for chest pain, palpitations and leg swelling.  Gastrointestinal: Positive for abdominal distention, abdominal pain, constipation and diarrhea. Negative for anal bleeding, blood in stool, nausea, rectal pain and vomiting.  Musculoskeletal: Negative.   Skin: Negative.   Neurological: Negative.   Psychiatric/Behavioral: Negative.     Objective:  Physical Exam Constitutional:      Appearance: She is well-developed.  HENT:     Head: Normocephalic and atraumatic.  Neck:     Musculoskeletal: Normal range of motion.  Cardiovascular:     Rate and Rhythm: Normal rate and regular rhythm.     Comments: Drains with minimal bloody drainage Pulmonary:     Effort: Pulmonary effort is normal. No respiratory distress.     Breath sounds: Normal breath sounds. No wheezing or rales.  Abdominal:     General: Bowel sounds are normal. There is no  distension.     Palpations: Abdomen is soft.     Tenderness: There is no abdominal tenderness. There is no rebound.     Comments: Some mild distention, mild tenderness diffuse with normal BS and no guarding or rebound  Skin:    General: Skin is warm and dry.  Neurological:     Mental Status: She is alert and oriented to person, place, and time.     Coordination: Coordination normal.     Vitals:   11/20/18 0911  BP: (!) 110/50  Pulse: 61  Temp: 98.2 F (36.8 C)  TempSrc: Oral  SpO2: 98%  Weight: 151 lb (68.5 kg)  Height: 5\' 5"  (1.651 m)    Assessment & Plan:  Tdap given at visit

## 2018-11-21 DIAGNOSIS — R109 Unspecified abdominal pain: Secondary | ICD-10-CM | POA: Insufficient documentation

## 2018-11-21 NOTE — Assessment & Plan Note (Signed)
Checking CBC, CMP, lipase today to rule out causes. Likely to be post operative ileus type changes. Rx for hyoscyamine to try for pain. Advised to follow bland diet and make sure to stay hydrated.

## 2019-01-06 ENCOUNTER — Telehealth: Payer: Self-pay | Admitting: Oncology

## 2019-01-06 NOTE — Telephone Encounter (Signed)
Called regarding upcoming [facility] appointment, screen test complete and invite has been sent.

## 2019-01-08 ENCOUNTER — Inpatient Hospital Stay: Payer: Medicare Other | Attending: Oncology | Admitting: Oncology

## 2019-01-08 DIAGNOSIS — C50112 Malignant neoplasm of central portion of left female breast: Secondary | ICD-10-CM | POA: Diagnosis not present

## 2019-01-08 DIAGNOSIS — Z87891 Personal history of nicotine dependence: Secondary | ICD-10-CM

## 2019-01-08 DIAGNOSIS — Z171 Estrogen receptor negative status [ER-]: Secondary | ICD-10-CM

## 2019-01-08 DIAGNOSIS — Z7982 Long term (current) use of aspirin: Secondary | ICD-10-CM | POA: Diagnosis not present

## 2019-01-08 DIAGNOSIS — Z79899 Other long term (current) drug therapy: Secondary | ICD-10-CM

## 2019-01-08 DIAGNOSIS — Z79891 Long term (current) use of opiate analgesic: Secondary | ICD-10-CM

## 2019-01-08 NOTE — Progress Notes (Signed)
[place]  [phone] [phone]    ID: Brenda [name] DOB: 1956-06-29  MR#: [id number]  VEH#:[id number]  Patient Care Team: [name], MD as PCP - General (Internal Medicine) Stanford Breed Denice Bors, MD as PCP - Cardiology (Cardiology) Reynold Bowen, MD as Consulting Physician (Endocrinology) Stark Klein, MD as Consulting Physician (General Surgery) Astin Rape, Virgie Dad, MD as Consulting Physician (Oncology) Stanford Breed Denice Bors, MD as Consulting Physician (Cardiology) Irene Limbo, MD as Consulting Physician (Plastic Surgery) Juanita Craver, MD as Consulting Physician (Gastroenterology) OTHER MD:   CHIEF COMPLAINT: PALB2 POSITIVE Ductal carcinoma in situ, estrogen receptor positive  CURRENT TREATMENT: OBSERVATION   I connected with Brenda [name] on 01/08/19  at  2:00 PM EDT by video enabled telemedicine visit and verified that I am speaking with the correct person using two identifiers.   I discussed the limitations, risks, security and privacy concerns of performing an evaluation and management service by telemedicine and the availability of in-person appointments. I also discussed with the patient that there may be a patient responsible charge related to this service. The patient expressed understanding and agreed to proceed.   Other persons participating in the visit and their role in the encounter: Wilburn Mylar, scribe.   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  Since her last visit, she underwent genetic testing on 10/14/2018. 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   She opted to undergo bilateral mastectomies on 11/07/2018. Pathology from this procedure ([id number]) revealed: right breast- benign breast parenchyma with  fibrocystic changes and calcifications, negative for carcinoma, and one benign lymph node; left breast- no evidence of residual ductal carcinoma in situ.   REVIEW OF SYSTEMS: Brenda [name] reports she is doing well overall. She states she is walking every day. She reports soreness to her right breast, which concerns her because the right breast was negative for carcinoma. She expressed frustration in the lack of communication with her about the processes, her diagnosis, her genetic testing, and other information regarding her breast cancer. The patient denies unusual headaches, visual changes, nausea, vomiting, stiff neck, dizziness, or gait imbalance. There has been no cough, phlegm production, or pleurisy, no chest pain or pressure, and no change in bowel or bladder habits. The patient denies fever, rash, bleeding, unexplained fatigue or unexplained weight loss. A detailed review of systems was otherwise entirely negative.   HISTORY OF CURRENT ILLNESS: Brenda [name] has a remote history of breast cancer, and she underwent a lumpectomy of the left breast in 1999.  She does not remember the size of the tumor but knows that 8 lymph nodes were removed and that they were all clear.  She was treated with chemotherapy and radiation.  She does not recall other details.  She did not receive antiestrogens  More recently, on 08/12/2018 Brenda [name] had routine screening mammography showing a possible abnormality in the left breast. She underwent unilateral left diagnostic mammography with tomography at [facility] on 08/15/2018 showing: Breast Density Category B. Spot compression magnification views were performed over the upper outer far posterior left breast. There are grouped microcalcifications in the region of the lumpectomy site in the far upper outer posterior left breast varying in shape, size, and density, spanning approximately 2.7 cm.   Accordingly on 08/19/2018 she proceeded to biopsy of the left breast area in  question. The pathology from this procedure showed ([id number]): ductal carcinoma in situ, intermediate nuclear grade with calcifications. Prognostic indicators significant for: estrogen receptor, 100% positive and progesterone receptor, 90% positive, both with strong staining intensity.   The patient's subsequent history is as detailed above.   PAST MEDICAL HISTORY: Past Medical History:  Diagnosis Date   Anxiety    Cancer [facility])    breast cancer   Carotid artery stenosis    CHF (congestive heart failure) ([place])    Coronary artery disease 2001   Depression    Family history of lung cancer    Hx of CABG 2001   Hypercholesteremia    Hypertension    Hyperthyroidism    Stroke ([place])    short term memory loss     PAST SURGICAL HISTORY: Past Surgical History:  Procedure Laterality Date   ABDOMINAL AORTOGRAM W/LOWER EXTREMITY N/A 06/13/2017   Procedure: ABDOMINAL AORTOGRAM W/LOWER EXTREMITY;  Surgeon: Waynetta Sandy, MD;  Location: [place] [facility];  Service: Cardiovascular;  Laterality: N/A;  Bilateral   ABDOMINAL HYSTERECTOMY     BREAST LUMPECTOMY  1999   BREAST LUMPECTOMY     left breast   CAROTID ENDARTERECTOMY Left 04/2000   CORONARY ARTERY BYPASS GRAFT  2001   x 5 (left internal mammary artery to left anterior  descending coronary artery   LEFT HEART CATH AND CORS/GRAFTS ANGIOGRAPHY N/A 02/21/2018   Procedure: LEFT HEART CATH AND CORS/GRAFTS ANGIOGRAPHY;  Surgeon: Lorretta Harp, MD;  Location: [place] [facility];  Service: Cardiovascular;  Laterality: N/A;   PERIPHERAL VASCULAR INTERVENTION  06/13/2017   Procedure: PERIPHERAL VASCULAR INTERVENTION;  Surgeon: Waynetta Sandy, MD;  Location: [place] [facility];  Service: Cardiovascular;;  Bilateral Iliac Stents   TOTAL MASTECTOMY Bilateral 11/07/2018   Procedure: BILATERAL MASTECTOMIES;  Surgeon: Stark Klein, MD;  Location: [place];  Service: General;  Laterality:  Bilateral;     FAMILY HISTORY: Family History  Problem Relation Age of Onset   Lung cancer Mother 62       died at an early age   Other Other        There is no Hx of premature coronary artery disease in her family   Brenda [name] has no information on her father.  The patient's mother died when she was 72 years old and the patient was put in foster care and partly raised by great aunts.  The patient has 1 sister, but she does not know her sister's medical history.  GYNECOLOGIC HISTORY:  No LMP recorded. Patient has had a hysterectomy. Menarche age 77, first live birth age 72, the patient is GX P1.  She had hysterectomy around age 31.  She did not receive hormone replacement.  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  She is now retired. 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  Daughter [name], age 35, works for the [employer] but currently is under [employer] because of an injury inflicted by a Ship broker.  The patient has 2 granddaughters, age 69 and 18 as of January 2019.  At home is just the patient.   ADVANCED DIRECTIVES: Not in place   HEALTH MAINTENANCE: Social History   Tobacco Use   Smoking status: Former Smoker    Types: Cigarettes    Last attempt to quit: 09/11/2001    Years since quitting: 90.3  Smokeless tobacco: Never Used  Substance Use Topics   Alcohol use: No   Drug use: No    Colonoscopy: January 2012  PAP: Status post hysterectomy  Bone density: May 2012 at [facility], T score -0.5   Allergies  Allergen Reactions   Adhesive [Tape] Itching    Current Outpatient Medications  Medication Sig Dispense Refill   amLODipine (NORVASC) 10 MG tablet Take 1 tablet (10 mg total) by mouth daily. 90 tablet 3   aspirin 81 MG tablet Take 1 tablet (81 mg total) by mouth daily. With Food 30 tablet 2    Carboxymethylcellulose Sodium (REFRESH PLUS OP) Place 1-2 drops into both eyes as needed (for dryness or irritation only when wearing contacts).     Cholecalciferol (VITAMIN D-3) 1000 units CAPS Take 2,000 Units by mouth daily.     HYDROcodone-acetaminophen (NORCO/VICODIN) 5-325 MG tablet Take 1 tablet by mouth every 6 (six) hours as needed for moderate pain.     hyoscyamine (LEVSIN, ANASPAZ) 0.125 MG tablet Take 1 tablet (0.125 mg total) by mouth every 4 (four) hours as needed. 90 tablet 0   isosorbide mononitrate (IMDUR) 30 MG 24 hr tablet Take 1 tablet (30 mg total) by mouth daily. 30 tablet 5   lisinopril-hydrochlorothiazide (PRINZIDE,ZESTORETIC) 20-25 MG tablet Take 1 tablet by mouth daily. 180 tablet 1   metoprolol succinate (TOPROL-XL) 100 MG 24 hr tablet Take 1 tablet (100 mg total) by mouth daily. Take with or immediately following a meal. 90 tablet 3   montelukast (SINGULAIR) 10 MG tablet Take 1 tablet (10 mg total) by mouth at bedtime. 90 tablet 3   Multiple Vitamins-Calcium (ONE-A-DAY WOMENS PO) Take 1 tablet by mouth daily.     nitroGLYCERIN (NITROSTAT) 0.4 MG SL tablet 1 tablet under tongue every 5 minutes as needed for chest discomfort (max 2 Tab/day) 25 tablet 1   OVER THE COUNTER MEDICATION Take 1 tablet by mouth See admin instructions. Unnamed Back Pain OTC medication: Take 1 tablet by mouth every six hours as needed for back pain     Potassium Chloride ER 20 MEQ TBCR Take 1 tablet daily on Monday Wednesdays Fridays and Sundays 30 tablet 6   rosuvastatin (CRESTOR) 20 MG tablet Take 20 mg by mouth daily.  1   simethicone (GAS-X) 80 MG chewable tablet Chew 1 tablet (80 mg total) by mouth every 6 (six) hours as needed for flatulence. 30 tablet 0   venlafaxine XR (EFFEXOR-XR) 37.5 MG 24 hr capsule TAKE 1 CAPSULE BY MOUTH ONCE DAILY WITH BREAKFAST 90 capsule 1   No current facility-administered medications for this visit.      OBJECTIVE: Middle-aged African-American  woman who appears well  There were no vitals filed for this visit.   There is no height or weight on file to calculate BMI.   Wt Readings from Last 3 Encounters:  11/20/18 151 lb (68.5 kg)  11/07/18 158 lb 8.2 oz (71.9 kg)  08/13/18 159 lb (72.1 kg)      ECOG FS:1 - Symptomatic but completely ambulatory   LAB RESULTS:  CMP     Component Value Date/Time   NA 136 11/20/2018 0948   NA 141 02/28/2017 1131   K 3.8 11/20/2018 0948   CL 99 11/20/2018 0948   CO2 30 11/20/2018 0948   GLUCOSE 89 11/20/2018 0948   BUN 19 11/20/2018 0948   BUN 13 02/28/2017 1131   CREATININE 1.15 11/20/2018 0948   CREATININE 1.14 (H) 10/08/2018 1109   CREATININE 1.04 (H)  01/31/2017 0915   CALCIUM 9.5 11/20/2018 0948   PROT 7.3 11/20/2018 0948   ALBUMIN 3.9 11/20/2018 0948   AST 17 11/20/2018 0948   AST 22 10/08/2018 1109   ALT 15 11/20/2018 0948   ALT 22 10/08/2018 1109   ALKPHOS 59 11/20/2018 0948   BILITOT 0.7 11/20/2018 0948   BILITOT 0.9 10/08/2018 1109   GFRNONAA 51 (L) 10/08/2018 1109   GFRAA 59 (L) 10/08/2018 1109    No results found for: TOTALPROTELP, ALBUMINELP, A1GS, A2GS, BETS, BETA2SER, GAMS, MSPIKE, SPEI  No results found for: KPAFRELGTCHN, LAMBDASER, KAPLAMBRATIO  Lab Results  Component Value Date   WBC 12.5 (H) 11/20/2018   NEUTROABS 5.6 10/08/2018   HGB 11.4 (L) 11/20/2018   HCT 33.5 (L) 11/20/2018   MCV 94.4 11/20/2018   PLT 308.0 11/20/2018    @LASTCHEMISTRY @  No results found for: LABCA2  No components found for: UOHFGB021  No results for input(s): INR in the last 168 hours.  No results found for: LABCA2  No results found for: JDB520  No results found for: EYE233  No results found for: KPQ244  No results found for: CA2729  No components found for: HGQUANT  No results found for: CEA1 / No results found for: CEA1   No results found for: AFPTUMOR  No results found for: CHROMOGRNA  No results found for: PSA1  No visits with results within 3 Day(s)  from this visit.  Latest known visit with results is:  Appointment on 11/20/2018  Component Date Value Ref Range Status   Lipase 11/20/2018 37.0  11.0 - 59.0 U/L Final   WBC 11/20/2018 12.5* 4.0 - 10.5 K/uL Final   RBC 11/20/2018 3.55* 3.87 - 5.11 Mil/uL Final   Platelets 11/20/2018 308.0  150.0 - 400.0 K/uL Final   Hemoglobin 11/20/2018 11.4* 12.0 - 15.0 g/dL Final   HCT 11/20/2018 33.5* 36.0 - 46.0 % Final   MCV 11/20/2018 94.4  78.0 - 100.0 fl Final   MCHC 11/20/2018 34.0  30.0 - 36.0 g/dL Final   RDW 11/20/2018 13.7  11.5 - 15.5 % Final   Sodium 11/20/2018 136  135 - 145 mEq/L Final   Potassium 11/20/2018 3.8  3.5 - 5.1 mEq/L Final   Chloride 11/20/2018 99  96 - 112 mEq/L Final   CO2 11/20/2018 30  19 - 32 mEq/L Final   Glucose, Bld 11/20/2018 89  70 - 99 mg/dL Final   BUN 11/20/2018 19  6 - 23 mg/dL Final   Creatinine, Ser 11/20/2018 1.15  0.40 - 1.20 mg/dL Final   Total Bilirubin 11/20/2018 0.7  0.2 - 1.2 mg/dL Final   Alkaline Phosphatase 11/20/2018 59  39 - 117 U/L Final   AST 11/20/2018 17  0 - 37 U/L Final   ALT 11/20/2018 15  0 - 35 U/L Final   Total Protein 11/20/2018 7.3  6.0 - 8.3 g/dL Final   Albumin 11/20/2018 3.9  3.5 - 5.2 g/dL Final   Calcium 11/20/2018 9.5  8.4 - 10.5 mg/dL Final   GFR 11/20/2018 57.33* >60.00 mL/min Final    (this displays the last labs from the last 3 days)  No results found for: TOTALPROTELP, ALBUMINELP, A1GS, A2GS, BETS, BETA2SER, GAMS, MSPIKE, SPEI (this displays SPEP labs)  No results found for: KPAFRELGTCHN, LAMBDASER, KAPLAMBRATIO (kappa/lambda light chains)  No results found for: HGBA, HGBA2QUANT, HGBFQUANT, HGBSQUAN (Hemoglobinopathy evaluation)   No results found for: LDH  Lab Results  Component Value Date   IRON 81 05/10/2017  IRONPCTSAT 24.3 05/10/2017   (Iron and TIBC)  No results found for: FERRITIN  Urinalysis No results found for: COLORURINE, APPEARANCEUR, LABSPEC, PHURINE, GLUCOSEU,  HGBUR, BILIRUBINUR, KETONESUR, PROTEINUR, UROBILINOGEN, NITRITE, LEUKOCYTESUR   STUDIES:  Review of cardiac catheterization 02/21/2018 documents normal left ventricular systolic function  ELIGIBLE FOR AVAILABLE RESEARCH PROTOCOL: no   ASSESSMENT: 63 y.o. 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  (a) no additional mutations were noted in APC, ATM, AXIN2, BARD1, BMPR1A, BRCA1, BRCA2, BRIP1, BUB1B, CDH1, CDK4, CDKN2A, CHEK2, CTNNA1, DICER1, ENG, EGFR, EPCAM, GALNT12, GREM1, HOXB13, KIT, MEN1, MLH1, MLH3, MSH2, MSH3, MSH6, MUTYH, NBN, NF1, NTHL1, PALB2, PDGFRA, PMS2, POLD1, POLE, PTEN, RAD50, RAD51C, RAD51D, RNF43, RPS20, SDHA, SDHB, SDHC, SDHD, SMAD4, SMARCA4, STK11, TP53, TSC1, TSC2, VHL.  (3) s/p bilateral mastectomies 11/07/2018 showing  (a) on the right, no evidence of carcinoma  (b) on the left, no evidence of residual ductal carcinoma in situ  (4) ovarian cancer risk: BSO recommended given lack of family history information  (a) status post remote hysterectomy  (b) bilateral salpingo-oophorectomy recommended  (5) pancreatic cancer risk:  (a) MRI of the abdomen with and without contrast June 29, 2017 documented 0.8 cm cyst in the tail of the pancreas measuring 0.8 cm, and appearing to communicate with the main duct.    (b) MRCP 07/04/2018 shows the known cyst in the tail of the pancreas to measure 0.7 cm    PLAN: Brenda [name] has some concerns regarding the healing of her bilateral mastectomies.  Note that she did not seek reconstruction because of concerns regarding her prior cardiac  issues.  More importantly she is very upset because she feels she never got the support and information that she expected.  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  She was very distraught today and she will let us know that she does not feel she got essentially any support or any information regarding her journey.  She now has to deal with a PALB 2 mutation.  She was contacted by genetics and her daughter is in process of being tested.  We have essentially no family history--hours the patient really does not know much about her family.  Under the circumstances I generally recommend bilateral salpingo-oophorectomy and I discussed that today with Dr. Denman George.  They are not doing the surgery during the pandemic but they feel that sometime in July it would be possible to do it and I will be contacting the patient regarding that.  There is also a concern regarding pancreatic cancer.  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  She will have a repeat MRCP in October  She is going to return to see me 01/17/2019 just to make sure that we get everything in line and she gets all her questions answered  She knows to call for any other issue that may develop before the next visit.   Brenda [name], Virgie Dad, MD  01/08/19 1:50 PM Medical Oncology and Hematology [facility] [place],  [place] [address] Tel. [phone]    Fax. [phone]    I, Wilburn Mylar, am acting as scribe for Dr. Virgie Dad. Brenda [name].  I, Lurline Del MD, have reviewed the above documentation for accuracy and completeness, and I agree with the above.

## 2019-01-09 ENCOUNTER — Telehealth: Payer: Self-pay | Admitting: Oncology

## 2019-01-09 NOTE — Telephone Encounter (Signed)
Called regarding schedule °

## 2019-01-16 NOTE — Progress Notes (Signed)
[place]  [phone] [phone]    ID: Brenda [name] DOB: 10-Apr-1956  MR#: [id number]  GEX#:[id number]  Patient Care Team: [name], MD as PCP - General (Internal Medicine) Stanford Breed Denice Bors, MD as PCP - Cardiology (Cardiology) Reynold Bowen, MD as Consulting Physician (Endocrinology) Stark Klein, MD as Consulting Physician (General Surgery) Magrinat, Virgie Dad, MD as Consulting Physician (Oncology) Stanford Breed Denice Bors, MD as Consulting Physician (Cardiology) Irene Limbo, MD as Consulting Physician (Plastic Surgery) Juanita Craver, MD as Consulting Physician (Gastroenterology) OTHER MD:   CHIEF COMPLAINT: PALB2 POSITIVE Ductal carcinoma in situ, estrogen receptor positive  CURRENT TREATMENT:  BSO pending   INTERVAL HISTORY: Brenda [name] was seen today for follow-up and treatment of her 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  She continues under observation.   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  I called her to discuss the possibility of bilateral salpingo-oophorectomy and she was very distraught which is the reason for the visit today. She notes some mild endometriosis behind her uterus, which lead to its removal in the 90's.    REVIEW OF SYSTEMS: Brenda [name] notes that she is ok, but she is trying to be as normal as she can be. She likes to walk for exercise, usually three to four days per week around 3 to 5 miles at a time. Brenda [name]'s 65 year old granddaughter occasionally comes over to spend the night. Brenda [name] is taking appropriate precautions against the spread of COVID-19. Her birthday was this weekend and rather than having guests, some of her friends lined up in their cars to drop off presents and well wishes. She wears gloves and a mask when she goes out. In her free time, she likes to read. Otherwise the patient denies unusual headaches, visual  changes, nausea, vomiting, or dizziness. There has been no unusual cough, phlegm production, or pleurisy. This been no change in bowel or bladder habits. The patient denies unexplained fatigue or unexplained weight loss, bleeding, rash, or fever. A detailed review of systems was otherwise noncontributory.    HISTORY OF CURRENT ILLNESS: From the original intake note:  Brenda [name] has a remote history of breast cancer, and she underwent a lumpectomy of the left breast in 1999.  She does not remember the size of the tumor but knows that 8 lymph nodes were removed and that they were all clear.  She was treated with chemotherapy and radiation.  She does not recall other details.  She did not receive antiestrogens  More recently, on 08/12/2018 Brenda [name] had routine screening mammography showing a possible abnormality in the left breast. She underwent unilateral left diagnostic mammography with tomography at [facility] on 08/15/2018 showing: Breast Density Category B. Spot compression magnification views were performed over the upper outer far posterior left breast. There are grouped microcalcifications in the region of the lumpectomy site in the far upper outer posterior left breast varying in shape, size, and density, spanning approximately 2.7 cm.   Accordingly on 08/19/2018 she proceeded to biopsy of the left breast area in question. The pathology from this procedure showed ([id number]): ductal carcinoma in situ, intermediate nuclear grade with calcifications. Prognostic indicators significant for: estrogen receptor, 100% positive and progesterone receptor, 90% positive, both with strong staining intensity.   The patient's subsequent history is as detailed above.   PAST MEDICAL HISTORY: Past Medical History:  Diagnosis Date  . Anxiety   . Cancer (  [place])    breast cancer  . Carotid artery stenosis   . CHF (congestive heart failure) ([place])   . Coronary artery disease 2001  . Depression   .  Family history of lung cancer   . Hx of CABG 2001  . Hypercholesteremia   . Hypertension   . Hyperthyroidism   . Stroke ([place])    short term memory loss     PAST SURGICAL HISTORY: Past Surgical History:  Procedure Laterality Date  . ABDOMINAL AORTOGRAM W/LOWER EXTREMITY N/A 06/13/2017   Procedure: ABDOMINAL AORTOGRAM W/LOWER EXTREMITY;  Surgeon: Waynetta Sandy, MD;  Location: [facility];  Service: Cardiovascular;  Laterality: N/A;  Bilateral  . ABDOMINAL HYSTERECTOMY    . BREAST LUMPECTOMY  1999  . BREAST LUMPECTOMY     left breast  . CAROTID ENDARTERECTOMY Left 04/2000  . CORONARY ARTERY BYPASS GRAFT  2001   x 5 (left internal mammary artery to left anterior  descending coronary artery  . LEFT HEART CATH AND CORS/GRAFTS ANGIOGRAPHY N/A 02/21/2018   Procedure: LEFT HEART CATH AND CORS/GRAFTS ANGIOGRAPHY;  Surgeon: Lorretta Harp, MD;  Location: [facility];  Service: Cardiovascular;  Laterality: N/A;  . PERIPHERAL VASCULAR INTERVENTION  06/13/2017   Procedure: PERIPHERAL VASCULAR INTERVENTION;  Surgeon: Waynetta Sandy, MD;  Location: [facility];  Service: Cardiovascular;;  Bilateral Iliac Stents  . TOTAL MASTECTOMY Bilateral 11/07/2018   Procedure: BILATERAL MASTECTOMIES;  Surgeon: Stark Klein, MD;  Location: [facility];  Service: General;  Laterality: Bilateral;     FAMILY HISTORY: Family History  Problem Relation Age of Onset  . Lung cancer Mother 62       died at an early age  . Other Other        There is no Hx of premature coronary artery disease in her family   [name] has no information on her father.  The patient's mother died when she was 57 years old and the patient was put in foster care and partly raised by great aunts. The patient has 1 sister, but she does not know her sister's medical history.   GYNECOLOGIC HISTORY:  No LMP recorded. Patient has had a hysterectomy. Menarche age 20, first live birth  age 60, the patient is GX P1.  She had hysterectomy around age 39.  She did not receive hormone replacement.  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  She is now retired. 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  Daughter Brenda [name], age 88, works for the [employer] but currently is under [employer] because of an injury inflicted by a Ship broker.  The patient has 2 granddaughters, age 7 and 12 as of January 2019.  At home is just the patient. Reola's sister lives outside of [place], [place]: Not in place   HEALTH MAINTENANCE: Social History   Tobacco Use  . Smoking status: Former Smoker    Types: Cigarettes    Last attempt to quit: 09/11/2001    Years since quitting: 17.3  . Smokeless tobacco: Never Used  Substance Use Topics  . Alcohol use: No  . Drug use: No    Colonoscopy: January 2012  PAP: Status post hysterectomy  B[facility] density: May [facility], T score -0.5   Allergies  Allergen Reactions  . Adhesive [Tape] Itching  Current Outpatient Medications  Medication Sig Dispense Refill  . amLODipine (NORVASC) 10 MG tablet Take 1 tablet (10 mg total) by mouth daily. 90 tablet 3  . aspirin 81 MG tablet Take 1 tablet (81 mg total) by mouth daily. With Food 30 tablet 2  . Carboxymethylcellulose Sodium (REFRESH PLUS OP) Place 1-2 drops into both eyes as needed (for dryness or irritation only when wearing contacts).    . Cholecalciferol (VITAMIN D-3) 1000 units CAPS Take 2,000 Units by mouth daily.    Marland Kitchen HYDROcodone-acetaminophen (NORCO/VICODIN) 5-325 MG tablet Take 1 tablet by mouth every 6 (six) hours as needed for moderate pain.    . hyoscyamine (LEVSIN, ANASPAZ) 0.125 MG tablet Take 1 tablet (0.125 mg total) by mouth every 4 (four) hours as needed. 90 tablet 0  . isosorbide mononitrate (IMDUR) 30 MG 24 hr tablet  Take 1 tablet (30 mg total) by mouth daily. 30 tablet 5  . lisinopril-hydrochlorothiazide (PRINZIDE,ZESTORETIC) 20-25 MG tablet Take 1 tablet by mouth daily. 180 tablet 1  . metoprolol succinate (TOPROL-XL) 100 MG 24 hr tablet Take 1 tablet (100 mg total) by mouth daily. Take with or immediately following a meal. 90 tablet 3  . montelukast (SINGULAIR) 10 MG tablet Take 1 tablet (10 mg total) by mouth at bedtime. 90 tablet 3  . Multiple Vitamins-Calcium (ONE-A-DAY WOMENS PO) Take 1 tablet by mouth daily.    . nitroGLYCERIN (NITROSTAT) 0.4 MG SL tablet 1 tablet under tongue every 5 minutes as needed for chest discomfort (max 2 Tab/day) 25 tablet 1  . OVER THE COUNTER MEDICATION Take 1 tablet by mouth See admin instructions. Unnamed Back Pain OTC medication: Take 1 tablet by mouth every six hours as needed for back pain    . Potassium Chloride ER 20 MEQ TBCR Take 1 tablet daily on Monday Wednesdays Fridays and Sundays 30 tablet 6  . rosuvastatin (CRESTOR) 20 MG tablet Take 20 mg by mouth daily.  1  . simethicone (GAS-X) 80 MG chewable tablet Chew 1 tablet (80 mg total) by mouth every 6 (six) hours as needed for flatulence. 30 tablet 0  . venlafaxine XR (EFFEXOR-XR) 37.5 MG 24 hr capsule TAKE 1 CAPSULE BY MOUTH ONCE DAILY WITH BREAKFAST 90 capsule 1   No current facility-administered medications for this visit.      OBJECTIVE: Middle-aged African-American woman in no acute distress  Vitals:   01/17/19 1136  BP: (!) 115/55  Pulse: 68  Resp: 18  Temp: 98 F (36.7 C)  SpO2: 100%     Body mass index is 24.65 kg/m.   Wt Readings from Last 3 Encounters:  01/17/19 148 lb 1.6 oz (67.2 kg)  11/20/18 151 lb (68.5 kg)  11/07/18 158 lb 8.2 oz (71.9 kg)      ECOG FS:1 - Symptomatic but completely ambulatory  Sclerae unicteric, EOMs intact Wearing a mask No cervical or supraclavicular adenopathy Lungs no rales or rhonchi Heart regular rate and rhythm Abd soft, nontender, positive bowel sounds,  no masses palpated MSK no focal spinal tenderness, no upper extremity lymphedema Neuro: nonfocal, well oriented, appropriate affect Breasts: Status post bilateral mastectomies.  There is some irregularity in the lateral aspect of the left incision, with some palpable masses which I asked her to monitor so she can become very familiar with their shape and size .  The right incision is unremarkable.  Both axillae are benign.  LAB RESULTS:  CMP     Component Value Date/Time   NA 136 01/17/2019 1057  NA 141 02/28/2017 1131   K 4.1 01/17/2019 1057   CL 103 01/17/2019 1057   CO2 25 01/17/2019 1057   GLUCOSE 95 01/17/2019 1057   BUN 21 01/17/2019 1057   BUN 13 02/28/2017 1131   CREATININE 1.08 (H) 01/17/2019 1057   CREATININE 1.14 (H) 10/08/2018 1109   CREATININE 1.04 (H) 01/31/2017 0915   CALCIUM 8.9 01/17/2019 1057   PROT 7.9 01/17/2019 1057   ALBUMIN 3.8 01/17/2019 1057   AST 22 01/17/2019 1057   AST 22 10/08/2018 1109   ALT 26 01/17/2019 1057   ALT 22 10/08/2018 1109   ALKPHOS 77 01/17/2019 1057   BILITOT 0.4 01/17/2019 1057   BILITOT 0.9 10/08/2018 1109   GFRNONAA 54 (L) 01/17/2019 1057   GFRNONAA 51 (L) 10/08/2018 1109   GFRAA >60 01/17/2019 1057   GFRAA 59 (L) 10/08/2018 1109    No results found for: TOTALPROTELP, ALBUMINELP, A1GS, A2GS, BETS, BETA2SER, GAMS, MSPIKE, SPEI  No results found for: KPAFRELGTCHN, LAMBDASER, KAPLAMBRATIO  Lab Results  Component Value Date   WBC 9.2 01/17/2019   NEUTROABS 5.7 01/17/2019   HGB 12.3 01/17/2019   HCT 37.8 01/17/2019   MCV 97.4 01/17/2019   PLT 228 01/17/2019    @LASTCHEMISTRY @  No results found for: LABCA2  No components found for: OZYYQM250  No results for input(s): INR in the last 168 hours.  No results found for: LABCA2  No results found for: IBB048  No results found for: GQB169  No results found for: IHW388  No results found for: CA2729  No components found for: HGQUANT  No results found for: CEA1 /  No results found for: CEA1   No results found for: AFPTUMOR  No results found for: CHROMOGRNA  No results found for: PSA1  Appointment on 01/17/2019  Component Date Value Ref Range Status  . Sodium 01/17/2019 136  135 - 145 mmol/L Final  . Potassium 01/17/2019 4.1  3.5 - 5.1 mmol/L Final  . Chloride 01/17/2019 103  98 - 111 mmol/L Final  . CO2 01/17/2019 25  22 - 32 mmol/L Final  . Glucose, Bld 01/17/2019 95  70 - 99 mg/dL Final  . BUN 01/17/2019 21  8 - 23 mg/dL Final  . Creatinine, Ser 01/17/2019 1.08* 0.44 - 1.00 mg/dL Final  . Calcium 01/17/2019 8.9  8.9 - 10.3 mg/dL Final  . Total Protein 01/17/2019 7.9  6.5 - 8.1 g/dL Final  . Albumin 01/17/2019 3.8  3.5 - 5.0 g/dL Final  . AST 01/17/2019 22  15 - 41 U/L Final  . ALT 01/17/2019 26  0 - 44 U/L Final  . Alkaline Phosphatase 01/17/2019 77  38 - 126 U/L Final  . Total Bilirubin 01/17/2019 0.4  0.3 - 1.2 mg/dL Final  . GFR calc non Af Amer 01/17/2019 54* >60 mL/min Final  . GFR calc Af Amer 01/17/2019 >60  >60 mL/min Final  . Anion gap 01/17/2019 8  5 - 15 Final   Performed at [facility], [place] [address], [place], [place] [address]  . WBC 01/17/2019 9.2  4.0 - 10.5 K/uL Final  . RBC 01/17/2019 3.88  3.87 - 5.11 MIL/uL Final  . Hemoglobin 01/17/2019 12.3  12.0 - 15.0 g/dL Final  . HCT 01/17/2019 37.8  36.0 - 46.0 % Final  . MCV 01/17/2019 97.4  80.0 - 100.0 fL Final  . MCH 01/17/2019 31.7  26.0 - 34.0 pg Final  . MCHC 01/17/2019 32.5  30.0 - 36.0 g/dL  Final  . RDW 01/17/2019 12.2  11.5 - 15.5 % Final  . Platelets 01/17/2019 228  150 - 400 K/uL Final  . nRBC 01/17/2019 0.0  0.0 - 0.2 % Final  . Neutrophils Relative % 01/17/2019 62  % Final  . Neutro Abs 01/17/2019 5.7  1.7 - 7.7 K/uL Final  . Lymphocytes Relative 01/17/2019 26  % Final  . Lymphs Abs 01/17/2019 2.4  0.7 - 4.0 K/uL Final  . Monocytes Relative 01/17/2019 9  % Final  . Monocytes Absolute 01/17/2019 0.8  0.1 - 1.0 K/uL Final  .  Eosinophils Relative 01/17/2019 2  % Final  . Eosinophils Absolute 01/17/2019 0.2  0.0 - 0.5 K/uL Final  . Basophils Relative 01/17/2019 1  % Final  . Basophils Absolute 01/17/2019 0.1  0.0 - 0.1 K/uL Final  . Immature Granulocytes 01/17/2019 0  % Final  . Abs Immature Granulocytes 01/17/2019 0.03  0.00 - 0.07 K/uL Final   Performed at [facility], [place] [address] [place], [place] [address]    (this displays the last labs from the last 3 days)  No results found for: TOTALPROTELP, ALBUMINELP, A1GS, A2GS, BETS, BETA2SER, GAMS, MSPIKE, SPEI (this displays SPEP labs)  No results found for: KPAFRELGTCHN, LAMBDASER, KAPLAMBRATIO (kappa/lambda light chains)  No results found for: HGBA, HGBA2QUANT, HGBFQUANT, HGBSQUAN (Hemoglobinopathy evaluation)   No results found for: LDH  Lab Results  Component Value Date   IRON 81 05/10/2017   IRONPCTSAT 24.3 05/10/2017   (Iron and TIBC)  No results found for: FERRITIN  Urinalysis No results found for: COLORURINE, APPEARANCEUR, LABSPEC, PHURINE, GLUCOSEU, HGBUR, BILIRUBINUR, KETONESUR, PROTEINUR, UROBILINOGEN, NITRITE, LEUKOCYTESUR   STUDIES:  No results found.   ELIGIBLE FOR AVAILABLE RESEARCH PROTOCOL: no   ASSESSMENT: 63 y.o. PALB2 positive Brenda [name] woman status post left breast biopsy 08/19/2018 for ductal carcinoma in situ, grade 2, estrogen and progesterone receptor positive  (1) left lumpectomy 1999 for an ?estrogen receptor negative stage I-II invasive breast cancer  (a) s/p chemotherapy  (b) s/p radiation  (2) genetics testing 10/14/2018 through the Common Hereditary Cancer Panel + EGFR was POSITIVE for a pathogenic variant in PALB2 c.3113G>A (p.[id number]*).  (a) no additional mutations were noted in APC, ATM, AXIN2, BARD1, BMPR1A, BRCA1, BRCA2, BRIP1, BUB1B, CDH1, CDK4, CDKN2A, CHEK2, CTNNA1, DICER1, ENG, EGFR, EPCAM, GALNT12, GREM1, HOXB13, KIT, MEN1, MLH1, MLH3, MSH2, MSH3, MSH6, MUTYH, NBN, NF1,  NTHL1, PALB2, PDGFRA, PMS2, POLD1, POLE, PTEN, RAD50, RAD51C, RAD51D, RNF43, RPS20, SDHA, SDHB, SDHC, SDHD, SMAD4, SMARCA4, STK11, TP53, TSC1, TSC2, VHL.  (3) s/p bilateral mastectomies 11/07/2018 showing  (a) on the right, no evidence of carcinoma  (b) on the left, no evidence of residual ductal carcinoma in situ  (4) ovarian cancer risk: BSO recommended given lack of family history information  (a) status post remote hysterectomy  (b) bilateral salpingo-oophorectomy pending  (5) pancreatic cancer risk:  (a) MRI of the abdomen with and without contrast June 29, 2017 documented 0.8 cm cyst in the tail of the pancreas appearing to communicate with the main duct.    (b) MRCP 07/04/2018 shows the known cyst in the tail of the pancreas to measure 0.7 cm   (c) MRCP OCT 2020  PLAN: Brenda [name] felt she did not get the navigation and support that she expected.  I hope that we can make it up for her going forward.  We discussed the genetic mutation in detail.  She does not know who her father was so she has absolutely no  information on that side of the family.  She has very little information on her mother side of the family since many people died when her mother was very young.  Brenda [name] is trying to get some test certificates to see if she can get more information but basically with the situation the only safe option is bilateral salpingo-oophorectomy which is what I have suggested.  She is agreeable and I am placing a referral to our gynecologic oncology group here.  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  I will obtain an MRCP this October and see her early November with regards to that  I have asked her to practice breast self-awareness namely essentially memorized the shape of each 1 of her incisions particularly the lateral aspect of the left incision so that she can let us know if there is any change, which I do not anticipate  She knows to call for any other issue that may  develop before the next visit.     Magrinat, Virgie Dad, MD  01/17/19 1:22 PM Medical Oncology and Hematology [facility] [address] [place], [place] [address] Tel. [phone]    Fax. [phone]   I, Jacqualyn Posey am acting as a Education administrator for Chauncey Cruel, MD.   I, Lurline Del MD, have reviewed the above documentation for accuracy and completeness, and I agree with the above.

## 2019-01-17 ENCOUNTER — Other Ambulatory Visit: Payer: Self-pay

## 2019-01-17 ENCOUNTER — Inpatient Hospital Stay: Payer: Medicare Other | Attending: Oncology | Admitting: Oncology

## 2019-01-17 ENCOUNTER — Inpatient Hospital Stay: Payer: Medicare Other

## 2019-01-17 VITALS — BP 115/55 | HR 68 | Temp 98.0°F | Resp 18 | Ht 65.0 in | Wt 148.1 lb

## 2019-01-17 DIAGNOSIS — Z923 Personal history of irradiation: Secondary | ICD-10-CM | POA: Diagnosis not present

## 2019-01-17 DIAGNOSIS — Z9221 Personal history of antineoplastic chemotherapy: Secondary | ICD-10-CM | POA: Diagnosis not present

## 2019-01-17 DIAGNOSIS — C50112 Malignant neoplasm of central portion of left female breast: Secondary | ICD-10-CM

## 2019-01-17 DIAGNOSIS — K869 Disease of pancreas, unspecified: Secondary | ICD-10-CM

## 2019-01-17 DIAGNOSIS — Z1509 Genetic susceptibility to other malignant neoplasm: Secondary | ICD-10-CM | POA: Diagnosis not present

## 2019-01-17 DIAGNOSIS — I509 Heart failure, unspecified: Secondary | ICD-10-CM | POA: Insufficient documentation

## 2019-01-17 DIAGNOSIS — Z7902 Long term (current) use of antithrombotics/antiplatelets: Secondary | ICD-10-CM | POA: Diagnosis not present

## 2019-01-17 DIAGNOSIS — I69311 Memory deficit following cerebral infarction: Secondary | ICD-10-CM | POA: Insufficient documentation

## 2019-01-17 DIAGNOSIS — D0582 Other specified type of carcinoma in situ of left breast: Secondary | ICD-10-CM | POA: Diagnosis present

## 2019-01-17 DIAGNOSIS — Z1501 Genetic susceptibility to malignant neoplasm of breast: Secondary | ICD-10-CM | POA: Diagnosis not present

## 2019-01-17 DIAGNOSIS — Z8249 Family history of ischemic heart disease and other diseases of the circulatory system: Secondary | ICD-10-CM | POA: Diagnosis not present

## 2019-01-17 DIAGNOSIS — Z1502 Genetic susceptibility to malignant neoplasm of ovary: Secondary | ICD-10-CM | POA: Diagnosis not present

## 2019-01-17 DIAGNOSIS — I739 Peripheral vascular disease, unspecified: Secondary | ICD-10-CM | POA: Diagnosis not present

## 2019-01-17 DIAGNOSIS — Z888 Allergy status to other drugs, medicaments and biological substances status: Secondary | ICD-10-CM | POA: Diagnosis not present

## 2019-01-17 DIAGNOSIS — I110 Hypertensive heart disease with heart failure: Secondary | ICD-10-CM

## 2019-01-17 DIAGNOSIS — Z79899 Other long term (current) drug therapy: Secondary | ICD-10-CM | POA: Insufficient documentation

## 2019-01-17 DIAGNOSIS — Z801 Family history of malignant neoplasm of trachea, bronchus and lung: Secondary | ICD-10-CM | POA: Diagnosis not present

## 2019-01-17 DIAGNOSIS — Z87891 Personal history of nicotine dependence: Secondary | ICD-10-CM | POA: Insufficient documentation

## 2019-01-17 DIAGNOSIS — Z170 Estrogen receptor positive status [ER+]: Secondary | ICD-10-CM | POA: Diagnosis not present

## 2019-01-17 DIAGNOSIS — Z1589 Genetic susceptibility to other disease: Secondary | ICD-10-CM

## 2019-01-17 DIAGNOSIS — Z951 Presence of aortocoronary bypass graft: Secondary | ICD-10-CM | POA: Insufficient documentation

## 2019-01-17 DIAGNOSIS — Z9013 Acquired absence of bilateral breasts and nipples: Secondary | ICD-10-CM | POA: Insufficient documentation

## 2019-01-17 DIAGNOSIS — N800 Endometriosis of uterus: Secondary | ICD-10-CM | POA: Diagnosis not present

## 2019-01-17 DIAGNOSIS — I6529 Occlusion and stenosis of unspecified carotid artery: Secondary | ICD-10-CM | POA: Diagnosis not present

## 2019-01-17 DIAGNOSIS — Z171 Estrogen receptor negative status [ER-]: Secondary | ICD-10-CM

## 2019-01-17 DIAGNOSIS — I2510 Atherosclerotic heart disease of native coronary artery without angina pectoris: Secondary | ICD-10-CM | POA: Insufficient documentation

## 2019-01-17 DIAGNOSIS — K862 Cyst of pancreas: Secondary | ICD-10-CM | POA: Insufficient documentation

## 2019-01-17 LAB — CBC WITH DIFFERENTIAL/PLATELET
Abs Immature Granulocytes: 0.03 10*3/uL (ref 0.00–0.07)
Basophils Absolute: 0.1 10*3/uL (ref 0.0–0.1)
Basophils Relative: 1 %
Eosinophils Absolute: 0.2 10*3/uL (ref 0.0–0.5)
Eosinophils Relative: 2 %
HCT: 37.8 % (ref 36.0–46.0)
Hemoglobin: 12.3 g/dL (ref 12.0–15.0)
Immature Granulocytes: 0 %
Lymphocytes Relative: 26 %
Lymphs Abs: 2.4 10*3/uL (ref 0.7–4.0)
MCH: 31.7 pg (ref 26.0–34.0)
MCHC: 32.5 g/dL (ref 30.0–36.0)
MCV: 97.4 fL (ref 80.0–100.0)
Monocytes Absolute: 0.8 10*3/uL (ref 0.1–1.0)
Monocytes Relative: 9 %
Neutro Abs: 5.7 10*3/uL (ref 1.7–7.7)
Neutrophils Relative %: 62 %
Platelets: 228 10*3/uL (ref 150–400)
RBC: 3.88 MIL/uL (ref 3.87–5.11)
RDW: 12.2 % (ref 11.5–15.5)
WBC: 9.2 10*3/uL (ref 4.0–10.5)
nRBC: 0 % (ref 0.0–0.2)

## 2019-01-17 LAB — COMPREHENSIVE METABOLIC PANEL
ALT: 26 U/L (ref 0–44)
AST: 22 U/L (ref 15–41)
Albumin: 3.8 g/dL (ref 3.5–5.0)
Alkaline Phosphatase: 77 U/L (ref 38–126)
Anion gap: 8 (ref 5–15)
BUN: 21 mg/dL (ref 8–23)
CO2: 25 mmol/L (ref 22–32)
Calcium: 8.9 mg/dL (ref 8.9–10.3)
Chloride: 103 mmol/L (ref 98–111)
Creatinine, Ser: 1.08 mg/dL — ABNORMAL HIGH (ref 0.44–1.00)
GFR calc Af Amer: >60 mL/min (ref >60)
GFR calc non Af Amer: 54 mL/min — ABNORMAL LOW (ref >60)
Glucose, Bld: 95 mg/dL (ref 70–99)
Potassium: 4.1 mmol/L (ref 3.5–5.1)
Sodium: 136 mmol/L (ref 135–145)
Total Bilirubin: 0.4 mg/dL (ref 0.3–1.2)
Total Protein: 7.9 g/dL (ref 6.5–8.1)

## 2019-01-20 ENCOUNTER — Telehealth: Payer: Self-pay | Admitting: Oncology

## 2019-01-20 NOTE — Telephone Encounter (Signed)
Called regarding schedule °

## 2019-01-21 ENCOUNTER — Telehealth: Payer: Self-pay | Admitting: *Deleted

## 2019-01-21 NOTE — Telephone Encounter (Signed)
Called and scheduled the paitent for an appt on 5/27

## 2019-01-28 ENCOUNTER — Encounter: Payer: Self-pay | Admitting: Gynecologic Oncology

## 2019-02-05 ENCOUNTER — Other Ambulatory Visit: Payer: Self-pay | Admitting: Gynecologic Oncology

## 2019-02-05 ENCOUNTER — Inpatient Hospital Stay (HOSPITAL_BASED_OUTPATIENT_CLINIC_OR_DEPARTMENT_OTHER): Payer: Medicare Other | Admitting: Gynecologic Oncology

## 2019-02-05 ENCOUNTER — Telehealth: Payer: Self-pay | Admitting: Oncology

## 2019-02-05 ENCOUNTER — Other Ambulatory Visit: Payer: Self-pay

## 2019-02-05 ENCOUNTER — Encounter: Payer: Self-pay | Admitting: Gynecologic Oncology

## 2019-02-05 VITALS — BP 111/61 | HR 70 | Temp 98.0°F | Resp 19 | Ht 65.0 in | Wt 147.2 lb

## 2019-02-05 DIAGNOSIS — Z1502 Genetic susceptibility to malignant neoplasm of ovary: Secondary | ICD-10-CM

## 2019-02-05 DIAGNOSIS — Z1501 Genetic susceptibility to malignant neoplasm of breast: Secondary | ICD-10-CM | POA: Diagnosis not present

## 2019-02-05 DIAGNOSIS — Z1509 Genetic susceptibility to other malignant neoplasm: Secondary | ICD-10-CM | POA: Diagnosis not present

## 2019-02-05 DIAGNOSIS — D0582 Other specified type of carcinoma in situ of left breast: Secondary | ICD-10-CM | POA: Diagnosis not present

## 2019-02-05 MED ORDER — SENNOSIDES-DOCUSATE SODIUM 8.6-50 MG PO TABS: 2.0000 | ORAL_TABLET | Freq: Every day | ORAL | 0 refills | Status: DC

## 2019-02-05 MED ORDER — TRAMADOL HCL 50 MG PO TABS: 50.0000 mg | ORAL_TABLET | Freq: Four times a day (QID) | ORAL | 0 refills | Status: DC | PRN

## 2019-02-05 MED ORDER — IBUPROFEN 600 MG PO TABS: 600.0000 mg | ORAL_TABLET | Freq: Four times a day (QID) | ORAL | 0 refills | Status: DC | PRN

## 2019-02-05 NOTE — Telephone Encounter (Signed)
Called [facility] [address] regarding scheduling an appointment for cardiac clearance and optimization for surgery on 02/27/19 with Dr. Denman George.  Apt scheduled for 02/24/19 with Bunnie Domino, PA.  L[place]ia has been notified of appointment date and time.

## 2019-02-05 NOTE — Patient Instructions (Signed)
We will need to obtain cardiac clearance and have you optimized from a cardiac standpoint by Dr. Stanford Breed prior to surgery.  You will also be advised to remain off of plavix for one week before surgery but you can stay on your aspirin 81 mg up until surgery.  Preparing for your Surgery  Plan for surgery on February 27, 2019 (if cardiac clearance has been obtained) with Dr. Everitt Amber at [place] will be scheduled for a robotic assisted bilateral salpingo-oophorectomy.   Pre-operative Testing -You will receive a phone call from presurgical testing at [facility] if you have not received a call already to arrange for a pre-operative testing appointment before your surgery.  This appointment normally occurs one to two weeks before your scheduled surgery.   -Bring your insurance card, copy of an advanced directive if applicable, medication list  -At that visit, you will be asked to sign a consent for a possible blood transfusion in case a transfusion becomes necessary during surgery.  The need for a blood transfusion is rare but having consent is a necessary part of your care.     -Do not take supplements such as fish oil (omega 3), red yeast rice, tumeric before your surgery.  Day Before Surgery at [facility] will be asked to take in a light diet the day before surgery.  Avoid carbonated beverages.  You will be advised to have nothing to eat or drink after midnight the evening before.    Eat a light diet the day before surgery.  Examples including soups, broths, toast, yogurt, mashed potatoes.  Things to avoid include carbonated beverages (fizzy beverages), raw fruits and raw vegetables, or beans.   If your bowels are filled with gas, your surgeon will have difficulty visualizing your pelvic organs which increases your surgical risks.  Your role in recovery Your role is to become active as soon as directed by your doctor, while still giving yourself time to heal.  Rest when  you feel tired. You will be asked to do the following in order to speed your recovery:  - Cough and breathe deeply. This helps toclear and expand your lungs and can prevent pneumonia. You may be given a spirometer to practice deep breathing. A staff member will show you how to use the spirometer. - Do mild physical activity. Walking or moving your legs help your circulation and body functions return to normal. A staff member will help you when you try to walk and will provide you with simple exercises. Do not try to get up or walk alone the first time. - Actively manage your pain. Managing your pain lets you move in comfort. We will ask you to rate your pain on a scale of zero to 10. It is your responsibility to tell your doctor or nurse where and how much you hurt so your pain can be treated.  Special Considerations -If you are diabetic, you may be placed on insulin after surgery to have closer control over your blood sugars to promote healing and recovery.  This does not mean that you will be discharged on insulin.  If applicable, your oral antidiabetics will be resumed when you are tolerating a solid diet.  -Your final pathology results from surgery should be available around one week after surgery and the results will be relayed to you when available.  -Dr. Lahoma Crocker is the surgeon that assists your GYN Oncologist with surgery.  If you end up staying the night,  the next day after your surgery you will either see Dr. Denman George or Dr. Lahoma Crocker.  -FMLA forms can be faxed to [phone] and please allow 5-7 business days for completion.  Pain Management After Surgery -You have been prescribed your pain medication and bowel regimen medications before surgery so that you can have these available when you are discharged from the hospital. The pain medication is for use ONLY AFTER surgery and a new prescription will not be given.   -Make sure that you have Tylenol and Ibuprofen at home  to use on a regular basis after surgery for pain control. We recommend alternating the medications every hour to six hours since they work differently and are processed in the body differently for pain relief.  -Review the attached handout on narcotic use and their risks and side effects.   Bowel Regimen -You have been prescribed Sennakot-S to take nightly to prevent constipation especially if you are taking the narcotic pain medication intermittently.  It is important to prevent constipation and drink adequate amounts of liquids.  Blood Transfusion Information WHAT IS A BLOOD TRANSFUSION? A transfusion is the replacement of blood or some of its parts. Blood is made up of multiple cells which provide different functions.  Red blood cells carry oxygen and are used for blood loss replacement.  White blood cells fight against infection.  Platelets control bleeding.  Plasma helps clot blood.  Other blood products are available for specialized needs, such as hemophilia or other clotting disorders. BEFORE THE TRANSFUSION  Who gives blood for transfusions?   You may be able to donate blood to be used at a later date on yourself (autologous donation).  Relatives can be asked to donate blood. This is generally not any safer than if you have received blood from a stranger. The same precautions are taken to ensure safety when a relative's blood is donated.  Healthy volunteers who are fully evaluated to make sure their blood is safe. This is blood bank blood. Transfusion therapy is the safest it has ever been in the practice of medicine. Before blood is taken from a donor, a complete history is taken to make sure that person has no history of diseases nor engages in risky social behavior (examples are intravenous drug use or sexual activity with multiple partners). The donor's travel history is screened to minimize risk of transmitting infections, such as malaria. The donated blood is tested for  signs of infectious diseases, such as HIV and hepatitis. The blood is then tested to be sure it is compatible with you in order to minimize the chance of a transfusion reaction. If you or a relative donates blood, this is often done in anticipation of surgery and is not appropriate for emergency situations. It takes many days to process the donated blood. RISKS AND COMPLICATIONS Although transfusion therapy is very safe and saves many lives, the main dangers of transfusion include:   Getting an infectious disease.  Developing a transfusion reaction. This is an allergic reaction to something in the blood you were given. Every precaution is taken to prevent this. The decision to have a blood transfusion has been considered carefully by your caregiver before blood is given. Blood is not given unless the benefits outweigh the risks.  AFTER SURGERY CONSIDERATIONS  Return to work: 2-4 weeks if applicable  Activity: 1. Be up and out of the bed during the day.  Take a nap if needed.  You may walk up steps but be careful and  use the hand rail.  Stair climbing will tire you more than you think, you may need to stop part way and rest.   2. No lifting or straining for 6 weeks.  3. No driving for 1 week(s).  Do not drive if you are taking narcotic pain medicine.  4. Shower daily.  Use soap and water on your incision and pat dry; don't rub.  No tub baths until cleared by your surgeon.   5. No sexual activity and nothing in the vagina for 2 weeks.  6. You may experience a small amount of clear drainage from your incisions, which is normal.  If the drainage persists or increases, please call the office.  7. Take Tylenol or ibuprofen first for pain and only use narcotic pain medicine for severe pain not relieved by the Tylenol or Ibuprofen.  Monitor your Tylenol intake to a max of 4,000 mg a day.  Diet: 1. Low sodium Heart Healthy Diet is recommended.  2. It is safe to use a laxative, such as Miralax  or Colace, if you have difficulty moving your bowels. You can take Sennakot at bedtime every evening to keep bowel movements regular and to prevent constipation.    Wound Care: 1. Keep clean and dry.  Shower daily.  Reasons to call the Doctor:  Fever - Oral temperature greater than 100.4 degrees Fahrenheit  Foul-smelling vaginal discharge  Difficulty urinating  Nausea and vomiting  Increased pain at the site of the incision that is unrelieved with pain medicine.  Difficulty breathing with or without chest pain  New calf pain especially if only on one side  Sudden, continuing increased vaginal bleeding with or without clots.

## 2019-02-05 NOTE — Progress Notes (Signed)
Consult Note: Gyn-Onc  Consult was requested by Dr. Vinie Sill for the evaluation of Brenda [name] 63 y.o. female  CC:  Chief Complaint  Patient presents with  . Monoallelic mutation of PALB2 gene    Assessment/Plan:  [name]  is a 63 y.o.  year old with a deleterious mutation in [place] which confers an increased risk for ovarian cancer. She also has significant coronary, carotid and peripheral vascular disease.  He recently tolerated a bilateral mastectomy and did well with this.  Therefore I think it is reasonable to contemplate a risk reducing BSO.  I do recommend an evaluation by her cardiologist prior to surgery for optimization and consideration for any preoperative testing.  I think is reasonable for her to stay on 81 mg of aspirin around the time of surgery, however would prefer she came off her Plavix 7 days preoperatively.  We would restart this postoperatively if there is no evidence of ongoing bleeding.  I explained to the patient that use of antiplatelet therapy perioperatively increases risks of bleeding complications.  I explained that coming off antiplatelet therapy increases her risk for a coronary, carotid, or peripheral vascular insult.  I also explained that she is at increased risk for stroke and heart attack around the time of surgery.  I explained the route of procedure being a plan robotic approach with minimally invasive bilateral salpingo-oophorectomy.  She may have significant adhesive disease from her history of endometriosis and if this is the case she is at increased risk for bowel bladder and other visceral injury.  I explained operative risks including  bleeding, infection, damage to internal organs (such as bladder,ureters, bowels), blood clot, reoperation and rehospitalization. I explained anticipated postoperative recovery.  She would like to proceed with surgery and we have this scheduled for her later next month.   HPI: [name] is a  63 year old P1 who was seen in consultation at the request of Dr. 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  The patient's cancer history began in the 1990s when she was first diagnosed with premenopausal breast cancer.  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  Given her history of dual breast cancer she underwent genetic testing on October 08, 2018 which revealed a pathologic variant identified in PAL B2.  The patient's medical history is otherwise significant for peripheral vascular disease (she is undergone bypass surgery with the lower extremities.  She is also had a history of coronary artery bypass graft in 2001, most recently in June 2019 she underwent a left heart catheterization with grafting angiography.  She has known carotid disease and has had TIA in the past.  She has a history of congestive heart disease (is unclear if this is secondary to coronary artery disease, or Adriamycin use, or both).  Her surgical history is significant for abdominal hysterectomy in the 1990s for endometriosis.  She reports that her surgeon, Dr. Harrington Challenger, told her that her endometriosis was behind her uterus.  Her ovaries remained after the surgery.  She is had one prior vaginal delivery. She takes both Plavix and aspirin as antiplatelet therapy for her vascular disease.  Current Meds:  Outpatient Encounter Medications as of 02/05/2019  Medication Sig  . amLODipine (NORVASC) 10 MG tablet Take 1 tablet (10 mg total) by mouth daily.  Marland Kitchen aspirin 81 MG tablet Take 1 tablet (81 mg total)  by mouth daily. With Food  . Carboxymethylcellulose Sodium (REFRESH PLUS OP) Place 1-2 drops into both eyes as needed (for dryness or irritation only when wearing contacts).  . Cholecalciferol (VITAMIN D-3) 1000 units CAPS Take 2,000 Units by mouth daily.  Marland Kitchen HYDROcodone-acetaminophen  (NORCO/VICODIN) 5-325 MG tablet Take 1 tablet by mouth every 6 (six) hours as needed for moderate pain.  . hyoscyamine (LEVSIN, ANASPAZ) 0.125 MG tablet Take 1 tablet (0.125 mg total) by mouth every 4 (four) hours as needed.  . isosorbide mononitrate (IMDUR) 30 MG 24 hr tablet Take 1 tablet (30 mg total) by mouth daily.  Marland Kitchen lisinopril-hydrochlorothiazide (PRINZIDE,ZESTORETIC) 20-25 MG tablet Take 1 tablet by mouth daily.  . metoprolol succinate (TOPROL-XL) 100 MG 24 hr tablet Take 1 tablet (100 mg total) by mouth daily. Take with or immediately following a meal.  . montelukast (SINGULAIR) 10 MG tablet Take 1 tablet (10 mg total) by mouth at bedtime.  . Multiple Vitamins-Calcium (ONE-A-DAY WOMENS PO) Take 1 tablet by mouth daily.  . nitroGLYCERIN (NITROSTAT) 0.4 MG SL tablet 1 tablet under tongue every 5 minutes as needed for chest discomfort (max 2 Tab/day)  . OVER THE COUNTER MEDICATION Take 1 tablet by mouth See admin instructions. Unnamed Back Pain OTC medication: Take 1 tablet by mouth every six hours as needed for back pain  . Potassium Chloride ER 20 MEQ TBCR Take 1 tablet daily on Monday Wednesdays Fridays and Sundays  . rosuvastatin (CRESTOR) 20 MG tablet Take 20 mg by mouth daily.  . simethicone (GAS-X) 80 MG chewable tablet Chew 1 tablet (80 mg total) by mouth every 6 (six) hours as needed for flatulence.  . venlafaxine XR (EFFEXOR-XR) 37.5 MG 24 hr capsule TAKE 1 CAPSULE BY MOUTH ONCE DAILY WITH BREAKFAST  . ibuprofen (ADVIL) 600 MG tablet Take 1 tablet (600 mg total) by mouth every 6 (six) hours as needed. For AFTER surgery  . senna-docusate (SENOKOT-S) 8.6-50 MG tablet Take 2 tablets by mouth at bedtime. For AFTER surgery, do not take if having diarrhea  . traMADol (ULTRAM) 50 MG tablet Take 1 tablet (50 mg total) by mouth every 6 (six) hours as needed. For AFTER surgery, do not take and drive   No facility-administered encounter medications on file as of 02/05/2019.     Allergy:   Allergies  Allergen Reactions  . Adhesive [Tape] Itching  . Latex Other (See Comments) and Rash    Reaction not recalled Reaction not recalled    Social Hx:   Social History   Socioeconomic History  . Marital status: Single    Spouse name: Not on file  . Number of children: 1  . Years of education: Not on file  . Highest education level: Not on file  Occupational History  . 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 Financial resource strain: Not on file  . Food insecurity:    Worry: Not on file    Inability: Not on file  . Transportation needs:    Medical: Not on file    Non-medical: Not on file  Tobacco Use  . Smoking status: Former Smoker    Types: Cigarettes    Last attempt to quit: 09/11/2001    Years since quitting: 17.4  . Smokeless tobacco: Never Used  Substance and Sexual Activity  . Alcohol use: No  . Drug use: No  . Sexual activity: Not on file  Lifestyle  . Physical activity:  Days per week: Not on file    Minutes per session: Not on file  . Stress: Not on file  Relationships  . Social connections:    Talks on phone: Not on file    Gets together: Not on file    Attends religious service: Not on file    Active member of club or organization: Not on file    Attends meetings of clubs or organizations: Not on file    Relationship status: Not on file  . Intimate partner violence:    Fear of current or ex partner: Not on file    Emotionally abused: Not on file    Physically abused: Not on file    Forced sexual activity: Not on file  Other Topics Concern  . Not on file  Social History Narrative  . Not on file    Past Surgical Hx:  Past Surgical History:  Procedure Laterality Date  . ABDOMINAL AORTOGRAM W/LOWER EXTREMITY N/A 06/13/2017   Procedure: ABDOMINAL AORTOGRAM W/LOWER EXTREMITY;  Surgeon: Waynetta Sandy, MD;  Location: [facility];  Service: Cardiovascular;  Laterality: N/A;   Bilateral  . ABDOMINAL HYSTERECTOMY    . BREAST LUMPECTOMY  1999  . BREAST LUMPECTOMY     left breast  . CAROTID ENDARTERECTOMY Left 04/2000  . CORONARY ARTERY BYPASS GRAFT  2001   x 5 (left internal mammary artery to left anterior  descending coronary artery  . LEFT HEART CATH AND CORS/GRAFTS ANGIOGRAPHY N/A 02/21/2018   Procedure: LEFT HEART CATH AND CORS/GRAFTS ANGIOGRAPHY;  Surgeon: Lorretta Harp, MD;  Location: [place] [facility];  Service: Cardiovascular;  Laterality: N/A;  . PERIPHERAL VASCULAR INTERVENTION  06/13/2017   Procedure: PERIPHERAL VASCULAR INTERVENTION;  Surgeon: Waynetta Sandy, MD;  Location: [facility];  Service: Cardiovascular;;  Bilateral Iliac Stents  . TOTAL MASTECTOMY Bilateral 11/07/2018   Procedure: BILATERAL MASTECTOMIES;  Surgeon: Stark Klein, MD;  Location: [place];  Service: General;  Laterality: Bilateral;    Past Medical Hx:  Past Medical History:  Diagnosis Date  . Anxiety   . Cancer [facility])    breast cancer  . Carotid artery stenosis   . CHF (congestive heart failure) ([place])   . Coronary artery disease 2001  . Depression   . Family history of lung cancer   . Hx of CABG 2001  . Hypercholesteremia   . Hypertension   . Hyperthyroidism   . Stroke [facility])    short term memory loss    Past Gynecological History:  SVD x 1 No LMP recorded. Patient has had a hysterectomy.  Family Hx:  Family History  Problem Relation Age of Onset  . Lung cancer Mother 39       died at an early age  . Other Other        There is no Hx of premature coronary artery disease in her family    Review of Systems:  Constitutional  Feels well,    ENT Normal appearing ears and nares bilaterally Skin/Breast  No rash, sores, jaundice, itching, dryness Cardiovascular  No chest pain, shortness of breath, or edema  Pulmonary  No cough or wheeze.  Gastro Intestinal  No nausea, vomitting, or diarrhoea. No bright red blood per  rectum, no abdominal pain, change in bowel movement, or constipation.  Genito Urinary  No frequency, urgency, dysuria, no bleeding  Musculo Skeletal  No myalgia, arthralgia, joint swelling or pain  Neurologic  No weakness, numbness, change in  gait,  Psychology  No depression, anxiety, insomnia.   Vitals:  Blood pressure 111/61, pulse 70, temperature 98 F (36.7 C), temperature source Oral, resp. rate 19, height 5' 5"  (1.651 m), weight 147 lb 3.2 oz (66.8 kg), SpO2 100 %.  Physical Exam: WD in NAD Neck  Supple NROM, without any enlargements.  Lymph Node Survey No cervical supraclavicular or inguinal adenopathy Cardiovascular  Pulse normal rate, regularity and rhythm. S1 and S2 normal.  Lungs  Clear to auscultation bilateraly, without wheezes/crackles/rhonchi. Good air movement.  Skin  No rash/lesions/breakdown  Psychiatry  Alert and oriented to person, place, and time  Abdomen  Normoactive bowel sounds, abdomen soft, non-tender and thin without evidence of hernia.  Back No CVA tenderness Genito Urinary  Vulva/vagina: Normal external female genitalia.   No lesions. No discharge or bleeding.  Bladder/urethra:  No lesions or masses, well supported bladder  Vagina: normal  Cervix and uterus surgically absnet  Adnexa: no palable masses. Rectal  deferred Extremities  No bilateral cyanosis, clubbing or edema. + scar from CABG graft   Thereasa Solo, MD  02/05/2019, 5:48 PM

## 2019-02-12 ENCOUNTER — Ambulatory Visit (INDEPENDENT_AMBULATORY_CARE_PROVIDER_SITE_OTHER): Payer: Medicare Other | Admitting: Internal Medicine

## 2019-02-12 DIAGNOSIS — R197 Diarrhea, unspecified: Secondary | ICD-10-CM | POA: Diagnosis not present

## 2019-02-12 MED ORDER — HYOSCYAMINE SULFATE 0.125 MG PO TABS: 0.1250 mg | ORAL_TABLET | ORAL | 6 refills | Status: DC | PRN

## 2019-02-12 NOTE — Progress Notes (Signed)
Virtual Visit via Video Note  I connected with Brenda [name] on 02/12/19 at 10:40 AM EDT by a video enabled telemedicine application and verified that I am speaking with the correct person using two identifiers.  The patient and the provider were at separate locations throughout the entire encounter.   I discussed the limitations of evaluation and management by telemedicine and the availability of in person appointments. The patient expressed understanding and agreed to proceed.  History of Present Illness: The patient is a 63 y.o. female with visit for diarrhea. Started after surgery bilateral mastectomy in end of February. Has had abdominal cramping and bloating along with this. She had been eating a lot of cheese and has stopped that as much recently. She denies known food triggers. She has tried some imodium early on which didn't help. She got hyoscyamine from [place] last visit for same and this did help with abdominal pain and bloating. Diarrhea is more in the evening. She notices that especially when she is outside in the heat she seems to get episodes of diarrhea after this. Is eating minimally due to the diarrhea. Denies blood in stool. Denies nausea or vomiting. Overall it is stable but not improving. Has tried hyoscyamine with some success.  Observations/Objective: Appearance: normal, breathing appears normal, casual grooming, abdomen does appear distended, throat normal, mental status is A and O times 3, EOM intact  Assessment and Plan: See problem oriented charting  Follow Up Instructions: refill hyoscyamine and checking stool pathogen panel to rule out etiology of symptoms, if no results may need referral to GI  Visit time 25 minutes: greater than 50% of that time was spent in face to face counseling and coordination of care with the patient: counseled about diarrhea, relation to the surgery, potential etiologies as well as treatment options, as well as potential referral to GI  I discussed  the assessment and treatment plan with the patient. The patient was provided an opportunity to ask questions and all were answered. The patient agreed with the plan and demonstrated an understanding of the instructions.   The patient was advised to call back or seek an in-person evaluation if the symptoms worsen or if the condition fails to improve as anticipated.  [name], MD

## 2019-02-13 ENCOUNTER — Encounter: Payer: Self-pay | Admitting: Internal Medicine

## 2019-02-13 NOTE — Assessment & Plan Note (Signed)
Checking stool pathogen panel. 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 Asked to keep food journal so we can see how different foods affect symptoms. Advised to try imodium daily to see if we can stop the diarrhea. Adjust as needed. If no relief needs referral to GI for consideration of further testing.

## 2019-02-17 ENCOUNTER — Telehealth: Payer: Self-pay | Admitting: Adult Health

## 2019-02-17 NOTE — Telephone Encounter (Signed)
Patient wants an OV.. Nurse will follow-up

## 2019-02-18 ENCOUNTER — Other Ambulatory Visit: Payer: Self-pay

## 2019-02-20 NOTE — Progress Notes (Signed)
CARDIAC CLEARANCE ANGELA DUKE PA 11-07-18 Epic EKG 03-26-18 Epic CHEST XRAY 03-02-18 Epic CARDIAC CATH 02-11-18 Epic ECHO 02-10-18 EPIC

## 2019-02-20 NOTE — Patient Instructions (Addendum)
Brenda [name]    Your procedure is scheduled on: 02-27-2019   Report to [facility] Main  Entrance    Report to admitting at [place] [facility] TEST ON 02-24-19 @ 3:30 PM, THIS TEST MUST BE DONE BEFORE SURGERY, COME TO Brenda [name]. ONCE YOUR TEST IS COMPLETED, PLEASE BEGIN THE QUARANTINE INSTRUCTIONS AS OUTLINED IN YOUR HANDOUT   Call this number if you have problems the morning of surgery [phone]    Remember: Do not eat food :After Midnight CLEAR LIQUIDS FROM MIDNIGHT UNTIL 700 AM. NOTHING BY MOUTH AFTER 700 AM.. BRUSH YOUR TEETH MORNING OF SURGERY AND RINSE YOUR MOUTH OUT, NO CHEWING GUM CANDY OR MINTS.     CLEAR LIQUID DIET   Foods Allowed                                                                     Foods Excluded  Coffee and tea, regular and decaf                             liquids that you cannot  Plain Jell-O in any flavor                                             see through such as: Fruit ices (not with fruit pulp)                                     milk, soups, orange juice  Iced Popsicles                                    All solid food                                   Cranberry, grape and apple juices Sports drinks like Gatorade Lightly seasoned clear broth or consume(fat free) Sugar, honey syrup  Sample Menu Breakfast                                Lunch                                     Supper Cranberry juice                    Beef broth                            Chicken broth Jell-O  Grape juice                           Apple juice Coffee or tea                        Jell-O                                      Popsicle                                                Coffee or tea                        Coffee or tea  _____________________________________________________________________   Eat a light diet the day before surgery.   Examples including soups, broths, toast, yogurt, mashed potatoes.  Things to avoid include carbonated beverages (fizzy beverages), raw fruits and raw vegetables, or beans.   If your bowels are filled with gas, your surgeon will have difficulty visualizing your pelvic organs which increases your surgical risks.    Take these medicines the morning of surgery with A SIP OF WATER: ISOSORBIDE MONONITRATE (IMDUR), VENLAFAXINE (EFFEXOR), ROSUVASTATIN ( CRESTOR), METOPROLOL SUCCINATE, AMLODIPINE (NORVASC)                                You may not have any metal on your body including hair pins and              piercings  Do not wear jewelry, make-up, lotions, powders or perfumes, deodorant             Do not wear nail polish.  Do not shave  48 hours prior to surgery.               Do not bring valuables to the hospital. [place].  Contacts, dentures or bridgework may not be worn into surgery.      Patients discharged the day of surgery will not be allowed to drive home. IF YOU ARE HAVING SURGERY AND GOING HOME THE SAME DAY, YOU MUST HAVE AN ADULT TO DRIVE YOU HOME AND BE WITH YOU FOR 24 HOURS. YOU MAY GO HOME BY TAXI OR UBER OR ORTHERWISE, BUT AN ADULT MUST ACCOMPANY YOU HOME AND STAY WITH YOU FOR 24 HOURS.  Name and phone number of your driver: Brenda [name] [phone]  Special Instructions: N/A              Please read over the following fact sheets you were given: _____________________________________________________________________             [facility] - Preparing for Surgery Before surgery, you can play an important role.  Because skin is not sterile, your skin needs to be as free of germs as possible.  You can reduce the number of germs on your skin by washing with CHG (chlorahexidine gluconate) soap before surgery.  CHG is an antiseptic cleaner which kills germs and bonds with the skin to continue killing germs even after  washing. Please DO NOT use if you have an allergy to CHG or antibacterial soaps.  If your skin becomes reddened/irritated stop using the CHG and inform your nurse when you arrive at Short Stay. Do not shave (including legs and underarms) for at least 48 hours prior to the first CHG shower.  You may shave your face/neck. Please follow these instructions carefully:  1.  Shower with CHG Soap the night before surgery and the  morning of Surgery.  2.  If you choose to wash your hair, wash your hair first as usual with your  normal  shampoo.  3.  After you shampoo, rinse your hair and body thoroughly to remove the  shampoo.                           4.  Use CHG as you would any other liquid soap.  You can apply chg directly  to the skin and wash                       Gently with a scrungie or clean washcloth.  5.  Apply the CHG Soap to your body ONLY FROM THE NECK DOWN.   Do not use on face/ open                           Wound or open sores. Avoid contact with eyes, ears mouth and genitals (private parts).                       Wash face,  Genitals (private parts) with your normal soap.             6.  Wash thoroughly, paying special attention to the area where your surgery  will be performed.  7.  Thoroughly rinse your body with warm water from the neck down.  8.  DO NOT shower/wash with your normal soap after using and rinsing off  the CHG Soap.                9.  Pat yourself dry with a clean towel.            10.  Wear clean pajamas.            11.  Place clean sheets on your bed the night of your first shower and do not  sleep with pets. Day of Surgery : Do not apply any lotions/deodorants the morning of surgery.  Please wear clean clothes to the hospital/surgery center.  FAILURE TO FOLLOW THESE INSTRUCTIONS MAY RESULT IN THE CANCELLATION OF YOUR SURGERY PATIENT SIGNATURE_________________________________  NURSE  SIGNATURE__________________________________  ________________________________________________________________________   [name]  An incentive spirometer is a tool that can help keep your lungs clear and active. This tool measures how well you are filling your lungs with each breath. Taking long deep breaths may help reverse or decrease the chance of developing breathing (pulmonary) problems (especially infection) following:  A long period of time when you are unable to move or be active. BEFORE THE PROCEDURE   If the spirometer includes an indicator to show your best effort, your nurse or respiratory therapist will set it to a desired goal.  If possible, sit up straight or lean slightly forward. Try not to slouch.  Hold the incentive spirometer in an upright position. INSTRUCTIONS FOR USE  1. Sit on the edge of your  bed if possible, or sit up as far as you can in bed or on a chair. 2. Hold the incentive spirometer in an upright position. 3. Breathe out normally. 4. Place the mouthpiece in your mouth and seal your lips tightly around it. 5. Breathe in slowly and as deeply as possible, raising the piston or the ball toward the top of the column. 6. Hold your breath for 3-5 seconds or for as long as possible. Allow the piston or ball to fall to the bottom of the column. 7. Remove the mouthpiece from your mouth and breathe out normally. 8. Rest for a few seconds and repeat Steps 1 through 7 at least 10 times every 1-2 hours when you are awake. Take your time and take a few normal breaths between deep breaths. 9. The spirometer may include an indicator to show your best effort. Use the indicator as a goal to work toward during each repetition. 10. After each set of 10 deep breaths, practice coughing to be sure your lungs are clear. If you have an incision (the cut made at the time of surgery), support your incision when coughing by placing a pillow or rolled up towels firmly  against it. Once you are able to get out of bed, walk around indoors and cough well. You may stop using the incentive spirometer when instructed by your caregiver.  RISKS AND COMPLICATIONS  Take your time so you do not get dizzy or light-headed.  If you are in pain, you may need to take or ask for pain medication before doing incentive spirometry. It is harder to take a deep breath if you are having pain. AFTER USE  Rest and breathe slowly and easily.  It can be helpful to keep track of a log of your progress. Your caregiver can provide you with a simple table to help with this. If you are using the spirometer at home, follow these instructions: [place] IF:   You are having difficultly using the spirometer.  You have trouble using the spirometer as often as instructed.  Your pain medication is not giving enough relief while using the spirometer.  You develop fever of 100.5 F (38.1 C) or higher. SEEK IMMEDIATE MEDICAL CARE IF:   You cough up bloody sputum that had not been present before.  You develop fever of 102 F (38.9 C) or greater.  You develop worsening pain at or near the incision site. MAKE SURE YOU:   Understand these instructions.  Will watch your condition.  Will get help right away if you are not doing well or get worse. Document Released: 01/08/2007 Document Revised: 11/20/2011 Document Reviewed: 03/11/2007 [employer] Patient Information 2014 [employer], [place].   ________________________________________________________________________  WHAT IS A BLOOD TRANSFUSION? Blood Transfusion Information  A transfusion is the replacement of blood or some of its parts. Blood is made up of multiple cells which provide different functions.  Red blood cells carry oxygen and are used for blood loss replacement.  White blood cells fight against infection.  Platelets control bleeding.  Plasma helps clot blood.  Other blood products are available for  specialized needs, such as hemophilia or other clotting disorders. BEFORE THE TRANSFUSION  Who gives blood for transfusions?   Healthy volunteers who are fully evaluated to make sure their blood is safe. This is blood bank blood. Transfusion therapy is the safest it has ever been in the practice of medicine. Before blood is taken from a donor, a complete history is taken to  make sure that person has no history of diseases nor engages in risky social behavior (examples are intravenous drug use or sexual activity with multiple partners). The donor's travel history is screened to minimize risk of transmitting infections, such as malaria. The donated blood is tested for signs of infectious diseases, such as HIV and hepatitis. The blood is then tested to be sure it is compatible with you in order to minimize the chance of a transfusion reaction. If you or a relative donates blood, this is often done in anticipation of surgery and is not appropriate for emergency situations. It takes many days to process the donated blood. RISKS AND COMPLICATIONS Although transfusion therapy is very safe and saves many lives, the main dangers of transfusion include:   Getting an infectious disease.  Developing a transfusion reaction. This is an allergic reaction to something in the blood you were given. Every precaution is taken to prevent this. The decision to have a blood transfusion has been considered carefully by your caregiver before blood is given. Blood is not given unless the benefits outweigh the risks. AFTER THE TRANSFUSION  Right after receiving a blood transfusion, you will usually feel much better and more energetic. This is especially true if your red blood cells have gotten low (anemic). The transfusion raises the level of the red blood cells which carry oxygen, and this usually causes an energy increase.  The nurse administering the transfusion will monitor you carefully for complications. HOME CARE  INSTRUCTIONS  No special instructions are needed after a transfusion. You may find your energy is better. Speak with your caregiver about any limitations on activity for underlying diseases you may have. SEEK MEDICAL CARE IF:   Your condition is not improving after your transfusion.  You develop redness or irritation at the intravenous (IV) site. SEEK IMMEDIATE MEDICAL CARE IF:  Any of the following symptoms occur over the next 12 hours:  Shaking chills.  You have a temperature by mouth above 102 F (38.9 C), not controlled by medicine.  Chest, back, or muscle pain.  People around you feel you are not acting correctly or are confused.  Shortness of breath or difficulty breathing.  Dizziness and fainting.  You get a rash or develop hives.  You have a decrease in urine output.  Your urine turns a dark color or changes to pink, red, or brown. Any of the following symptoms occur over the next 10 days:  You have a temperature by mouth above 102 F (38.9 C), not controlled by medicine.  Shortness of breath.  Weakness after normal activity.  The white part of the eye turns yellow (jaundice).  You have a decrease in the amount of urine or are urinating less often.  Your urine turns a dark color or changes to pink, red, or brown. Document Released: 08/25/2000 Document Revised: 11/20/2011 Document Reviewed: 04/13/2008 [facility] Patient Information 2014 [place], [place].  _______________________________________________________________________

## 2019-02-21 ENCOUNTER — Encounter (HOSPITAL_COMMUNITY)
Admission: RE | Admit: 2019-02-21 | Discharge: 2019-02-21 | Disposition: A | Discharge status: Home / Self Care | Payer: Medicare Other | Source: Ambulatory Visit | Attending: Gynecologic Oncology | Admitting: Gynecologic Oncology

## 2019-02-21 ENCOUNTER — Encounter (HOSPITAL_COMMUNITY): Payer: Self-pay

## 2019-02-21 ENCOUNTER — Other Ambulatory Visit: Payer: Self-pay

## 2019-02-21 ENCOUNTER — Telehealth: Payer: Self-pay

## 2019-02-21 DIAGNOSIS — Z801 Family history of malignant neoplasm of trachea, bronchus and lung: Secondary | ICD-10-CM | POA: Insufficient documentation

## 2019-02-21 DIAGNOSIS — Z1502 Genetic susceptibility to malignant neoplasm of ovary: Secondary | ICD-10-CM | POA: Diagnosis not present

## 2019-02-21 DIAGNOSIS — Z79899 Other long term (current) drug therapy: Secondary | ICD-10-CM | POA: Diagnosis not present

## 2019-02-21 DIAGNOSIS — Z1159 Encounter for screening for other viral diseases: Secondary | ICD-10-CM | POA: Insufficient documentation

## 2019-02-21 DIAGNOSIS — F329 Major depressive disorder, single episode, unspecified: Secondary | ICD-10-CM | POA: Diagnosis not present

## 2019-02-21 DIAGNOSIS — I2510 Atherosclerotic heart disease of native coronary artery without angina pectoris: Secondary | ICD-10-CM | POA: Diagnosis not present

## 2019-02-21 DIAGNOSIS — Z01818 Encounter for other preprocedural examination: Secondary | ICD-10-CM | POA: Diagnosis present

## 2019-02-21 DIAGNOSIS — I69311 Memory deficit following cerebral infarction: Secondary | ICD-10-CM | POA: Diagnosis not present

## 2019-02-21 DIAGNOSIS — Z853 Personal history of malignant neoplasm of breast: Secondary | ICD-10-CM | POA: Diagnosis not present

## 2019-02-21 DIAGNOSIS — F419 Anxiety disorder, unspecified: Secondary | ICD-10-CM | POA: Diagnosis not present

## 2019-02-21 DIAGNOSIS — Z7982 Long term (current) use of aspirin: Secondary | ICD-10-CM | POA: Insufficient documentation

## 2019-02-21 DIAGNOSIS — Z951 Presence of aortocoronary bypass graft: Secondary | ICD-10-CM | POA: Insufficient documentation

## 2019-02-21 DIAGNOSIS — E7800 Pure hypercholesterolemia, unspecified: Secondary | ICD-10-CM | POA: Insufficient documentation

## 2019-02-21 DIAGNOSIS — I509 Heart failure, unspecified: Secondary | ICD-10-CM | POA: Insufficient documentation

## 2019-02-21 DIAGNOSIS — I110 Hypertensive heart disease with heart failure: Secondary | ICD-10-CM | POA: Insufficient documentation

## 2019-02-21 DIAGNOSIS — Z87891 Personal history of nicotine dependence: Secondary | ICD-10-CM | POA: Diagnosis not present

## 2019-02-21 HISTORY — DX: Nausea with vomiting, unspecified: R11.2

## 2019-02-21 HISTORY — DX: Other complications of anesthesia, initial encounter: T88.59XA

## 2019-02-21 HISTORY — DX: Other specified postprocedural states: Z98.890

## 2019-02-21 LAB — URINALYSIS, ROUTINE W REFLEX MICROSCOPIC
Bilirubin Urine: NEGATIVE
Glucose, UA: NEGATIVE mg/dL
Hgb urine dipstick: NEGATIVE
Ketones, ur: NEGATIVE mg/dL
Leukocytes,Ua: NEGATIVE
Nitrite: NEGATIVE
Protein, ur: NEGATIVE mg/dL
Specific Gravity, Urine: 1.014 (ref 1.005–1.030)
pH: 5 (ref 5.0–8.0)

## 2019-02-21 LAB — COMPREHENSIVE METABOLIC PANEL
ALT: 21 U/L (ref 0–44)
AST: 26 U/L (ref 15–41)
Albumin: 4.3 g/dL (ref 3.5–5.0)
Alkaline Phosphatase: 67 U/L (ref 38–126)
Anion gap: 7 (ref 5–15)
BUN: 22 mg/dL (ref 8–23)
CO2: 24 mmol/L (ref 22–32)
Calcium: 9.6 mg/dL (ref 8.9–10.3)
Chloride: 106 mmol/L (ref 98–111)
Creatinine, Ser: 0.82 mg/dL (ref 0.44–1.00)
GFR calc Af Amer: >60 mL/min (ref >60)
GFR calc non Af Amer: >60 mL/min (ref >60)
Glucose, Bld: 88 mg/dL (ref 70–99)
Potassium: 3.7 mmol/L (ref 3.5–5.1)
Sodium: 137 mmol/L (ref 135–145)
Total Bilirubin: 0.5 mg/dL (ref 0.3–1.2)
Total Protein: 8.1 g/dL (ref 6.5–8.1)

## 2019-02-21 LAB — CBC
HCT: 36.8 % (ref 36.0–46.0)
Hemoglobin: 11.8 g/dL — ABNORMAL LOW (ref 12.0–15.0)
MCH: 31.6 pg (ref 26.0–34.0)
MCHC: 32.1 g/dL (ref 30.0–36.0)
MCV: 98.7 fL (ref 80.0–100.0)
Platelets: 197 10*3/uL (ref 150–400)
RBC: 3.73 MIL/uL — ABNORMAL LOW (ref 3.87–5.11)
RDW: 12.4 % (ref 11.5–15.5)
WBC: 6.7 10*3/uL (ref 4.0–10.5)
nRBC: 0 % (ref 0.0–0.2)

## 2019-02-21 LAB — ABO/RH: ABO/RH(D): A POS

## 2019-02-21 NOTE — Telephone Encounter (Signed)
Pt wanted to know if she needed to come off of her Plavix for her upcoming surgery. (hysterectomy)  Looked at medication list and it is no longer on her list though her note does say to continue for a year. Spoke with patient and she looked through her medications and realized that she has not been taking it . She said that she will hold on it until she has finished the surgery and call the pharmacy for a refill .   York Cerise, CMA

## 2019-02-23 NOTE — Progress Notes (Signed)
Cardiology Office Note:    Date:  02/24/2019   ID:  Brenda [name], DOB 1956/02/20, MRN [id number]  PCP:  [name], MD  Cardiologist:  Kirk Ruths, MD   Chief Complaint  Patient presents with  . Pre-op Exam    History of Present Illness:    Brenda [name] is a 63 y.o. female with a hx of hypertension, carotid artery stenosis, PVD, hyperlipidemia, CAD. She had CABG in 2001 (LIMA to the LAD, sequential saphenous vein graft to the distal right coronary and PDA, sequential saphenous vein graft to the ramus and obtuse marginal) and carotid endarterectomy. Cardiac catheterization June 2019 showed normal LV function, 80% left main, occluded right coronary artery, 95% ramus and 100% obtuse marginal. Sequential saphenous vein graft to the PLA-PDA and LIMA to the LAD patent. Sequential saphenous vein graft to the obtuse marginal and ramus intermedius occluded. Medical therapy recommended.  Echocardiogram in June 2019 showed normal LV function, moderate diastolic dysfunction, mild mitral regurgitation, and mild left atrial enlargement.  Peripheral vascular disease is managed by vascular surgery.  She was last seen in clinic by Dr. Stanford Breed on 08/13/2018.  She was doing well at that time.  She continues on aspirin and statin.  Follow-up carotid Dopplers revealed right ICA with 40 to 59% stenosis, right ECA greater than 50% stenosis left ICA with 1 to 39% stenosis.  She is status post left endarterectomy.  Pressures were elevated at that clinic visit but she was out of her spironolactone and lisinopril x1 week.   She presents to clinic today for preoperative clearance for minimally invasive bilateral salpingo-oophorectomy 02/27/2019. She reports doing well from a cardiac standpoint over the past 6 months. She has chronic stable angina which is unchanged in recent months. She continues to walk 3-5 miles per day and works in her yard without chest pain or SOB. She reports using 1 bottle (#25) of SL  nitro since her last visit with Dr. Stanford Breed but has not used any in the past month. She has been off her plavix for a while after realizing her prescription was not renewed, however she reports Dr. Donzetta Matters was okay with discontinuing plavix at this time. No complaints of orthopnea, PND, LE edema, dizziness, lightheadedness, or syncope. She does have a cough today which she attributes to allergies after walking outside this morning. No complaints of fevers or contact with anyone with symptoms suspicious for COVID-19. She is to undergo COVID-19 testing prior to her surgery 02/27/2019.   Past Medical History:  Diagnosis Date  . Anxiety   . Cancer [facility])    breast cancer  . Carotid artery stenosis   . CHF (congestive heart failure) ([place])   . Complication of anesthesia   . Coronary artery disease 2001  . Depression   . Family history of lung cancer   . Hx of CABG 2001  . Hypercholesteremia   . Hypertension   . Hyperthyroidism   . PONV (postoperative nausea and vomiting)   . Stroke [facility])    short term memory loss    Past Surgical History:  Procedure Laterality Date  . ABDOMINAL AORTOGRAM W/LOWER EXTREMITY N/A 06/13/2017   Procedure: ABDOMINAL AORTOGRAM W/LOWER EXTREMITY;  Surgeon: Waynetta Sandy, MD;  Location: [place] [facility];  Service: Cardiovascular;  Laterality: N/A;  Bilateral  . ABDOMINAL HYSTERECTOMY    . BREAST LUMPECTOMY  1999  . BREAST LUMPECTOMY     left breast  . CAROTID ENDARTERECTOMY Left 04/2000  . CORONARY ARTERY BYPASS GRAFT  2001   x 5 (left internal mammary artery to left anterior  descending coronary artery  . LEFT HEART CATH AND CORS/GRAFTS ANGIOGRAPHY N/A 02/21/2018   Procedure: LEFT HEART CATH AND CORS/GRAFTS ANGIOGRAPHY;  Surgeon: Lorretta Harp, MD;  Location: [place] [facility];  Service: Cardiovascular;  Laterality: N/A;  . MASTECTOMY Bilateral   . PERIPHERAL VASCULAR INTERVENTION  06/13/2017   Procedure: PERIPHERAL VASCULAR INTERVENTION;  Surgeon:  Waynetta Sandy, MD;  Location: [place] [facility];  Service: Cardiovascular;;  Bilateral Iliac Stents  . TOTAL MASTECTOMY Bilateral 11/07/2018   Procedure: BILATERAL MASTECTOMIES;  Surgeon: Stark Klein, MD;  Location: [place];  Service: General;  Laterality: Bilateral;    Current Medications: Current Meds  Medication Sig  . amLODipine (NORVASC) 10 MG tablet Take 1 tablet (10 mg total) by mouth daily.  Marland Kitchen aspirin 81 MG tablet Take 1 tablet (81 mg total) by mouth daily. With Food  . Carboxymethylcellulose Sodium (REFRESH PLUS OP) Place 1-2 drops into both eyes as needed (for dryness or irritation only when wearing contacts).  . Cholecalciferol (VITAMIN D-3) 1000 units CAPS Take 2,000 Units by mouth daily.  . hyoscyamine (LEVSIN) 0.125 MG tablet Take 1 tablet (0.125 mg total) by mouth every 4 (four) hours as needed. (Patient taking differently: Take 0.125 mg by mouth every 4 (four) hours as needed for bladder spasms or cramping. )  . isosorbide mononitrate (IMDUR) 60 MG 24 hr tablet Take 1 tablet (60 mg total) by mouth daily.  Marland Kitchen lisinopril-hydrochlorothiazide (PRINZIDE,ZESTORETIC) 20-25 MG tablet Take 1 tablet by mouth daily.  . metoprolol succinate (TOPROL-XL) 100 MG 24 hr tablet Take 1 tablet (100 mg total) by mouth daily. Take with or immediately following a meal.  . montelukast (SINGULAIR) 10 MG tablet Take 1 tablet (10 mg total) by mouth at bedtime.  . Multiple Vitamins-Calcium (ONE-A-DAY WOMENS PO) Take 1 tablet by mouth daily.  . nitroGLYCERIN (NITROSTAT) 0.4 MG SL tablet Place 1 tablet (0.4 mg total) under the tongue every 5 (five) minutes as needed for chest pain. 1 tablet under tongue every 5 minutes as needed for chest discomfort (max 2 Tab/day)  . Potassium Chloride ER 20 MEQ TBCR Take 1 tablet daily on Monday Wednesdays Fridays and Sundays  . rosuvastatin (CRESTOR) 20 MG tablet Take 20 mg by mouth daily.  Marland Kitchen venlafaxine XR (EFFEXOR-XR) 37.5 MG 24 hr capsule  TAKE 1 CAPSULE BY MOUTH ONCE DAILY WITH BREAKFAST (Patient taking differently: Take 37.5 mg by mouth daily with breakfast. )  . [DISCONTINUED] isosorbide mononitrate (IMDUR) 30 MG 24 hr tablet Take 1 tablet (30 mg total) by mouth daily.  . [DISCONTINUED] nitroGLYCERIN (NITROSTAT) 0.4 MG SL tablet 1 tablet under tongue every 5 minutes as needed for chest discomfort (max 2 Tab/day) (Patient taking differently: Place 0.4 mg under the tongue every 5 (five) minutes as needed for chest pain. 1 tablet under tongue every 5 minutes as needed for chest discomfort (max 2 Tab/day))     Allergies:   Adhesive [tape] and Latex   Social History   Socioeconomic History  . Marital status: Single    Spouse name: Not on file  . Number of children: 1  . Years of education: Not on file  . Highest education level: Not on file  Occupational History  . 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 Financial resource strain: Not on file  . Food insecurity    Worry: Not on  file    Inability: Not on file  . Transportation needs    Medical: Not on file    Non-medical: Not on file  Tobacco Use  . Smoking status: Former Smoker    Types: Cigarettes    Quit date: 09/11/2001    Years since quitting: 17.4  . Smokeless tobacco: Never Used  Substance and Sexual Activity  . Alcohol use: No  . Drug use: No  . Sexual activity: Not on file  Lifestyle  . Physical activity    Days per week: Not on file    Minutes per session: Not on file  . Stress: Not on file  Relationships  . Social Herbalist on phone: Not on file    Gets together: Not on file    Attends religious service: Not on file    Active member of club or organization: Not on file    Attends meetings of clubs or organizations: Not on file    Relationship status: Not on file  Other Topics Concern  . Not on file  Social History Narrative  . Not on file     Family History: The patient's family  history includes Lung cancer (age of onset: 17) in her mother; Other in an other family member.  ROS:   Please see the history of present illness.     All other systems reviewed and are negative.  EKGs/Labs/Other Studies Reviewed:    The following studies were reviewed today: Carotid duplex 08/2018: Summary: Right Carotid: Velocities in the right ICA are consistent with a 40-59% stenosis. Hemodynamically significant plaque >50% visualized in the CCA. The ECA appears >50% stenosed. The RICA velocities are elevated and have increased compared to the prior exam.  Left Carotid: Velocities in the left ICA are consistent with a 1-39% stenosis. Non-hemodynamically significant plaque noted in the CCA. The LICA  velocities remain within normal range and stable when compared to  the prior exam, s/p endarterectomy.  Vertebrals:  Left vertebral artery demonstrates antegrade flow. Antegrade, aberrant flow on the right. Subclavians: Bilateral subclavian arteries were stenotic. Bilateral subclavian artery flow was disturbed.  Echocardiogram 02/2018 Study Conclusions  - Left ventricle: The cavity size was normal. There was mild   concentric hypertrophy. Systolic function was normal. The   estimated ejection fraction was in the range of 60% to 65%. Wall   motion was normal; there were no regional wall motion   abnormalities. Features are consistent with a pseudonormal left   ventricular filling pattern, with concomitant abnormal relaxation   and increased filling pressure (grade 2 diastolic dysfunction). - Mitral valve: There was mild regurgitation directed centrally. - Left atrium: The atrium was mildly dilated.  Left heart catheterization 02/2018:  The left ventricular systolic function is normal.  LV end diastolic pressure is normal.  The left ventricular ejection fraction is 55-65% by visual estimate.  Ost LM to Mid LM lesion is 80% stenosed.  Dist RCA lesion is 100% stenosed.  Prox  RCA lesion is 30% stenosed.  Ost LAD to Prox LAD lesion is 50% stenosed.  Prox LAD to Mid LAD lesion is 50% stenosed.  Ost Ramus to Ramus lesion is 95% stenosed.  Ost 1st Mrg lesion is 100% stenosed.  Origin to Prox Graft lesion before Ramus is 100% stenosed.   EKG:  EKG is ordered today. The ekg ordered today demonstrates sinus rhythm with rate 65 bpm, non-specific   Recent Labs: 04/25/2018: TSH 1.51 02/21/2019: ALT 21; BUN 22; Creatinine, Ser  0.82; Hemoglobin 11.8; Platelets 197; Potassium 3.7; Sodium 137  Recent Lipid Panel    Component Value Date/Time   CHOL 122 02/21/2018 0228   TRIG 63 02/21/2018 0228   HDL 40 (L) 02/21/2018 0228   CHOLHDL 3.1 02/21/2018 0228   VLDL 13 02/21/2018 0228   LDLCALC 69 02/21/2018 0228   LDLDIRECT 151.5 04/03/2013 0832    Physical Exam:    VS:  BP 107/62   Pulse 66   Temp (!) 97.2 F (36.2 C)   Ht 5\' 5"  (1.651 m)   Wt 150 lb 9.6 oz (68.3 kg)   SpO2 98%   BMI 25.06 kg/m     Wt Readings from Last 3 Encounters:  02/24/19 150 lb 9.6 oz (68.3 kg)  02/21/19 151 lb 15.8 oz (68.9 kg)  02/05/19 147 lb 3.2 oz (66.8 kg)     GEN: Well nourished, well developed in no acute distress HEENT: Sclera anicteric  NECK: No JVD; No carotid bruits LYMPHATICS: No lymphadenopathy CARDIAC: RRR, +murmur, no rubs or gallops RESPIRATORY:  Clear to auscultation without rales, wheezing or rhonchi  ABDOMEN: Soft, non-tender, non-distended MUSCULOSKELETAL:  No edema; No deformity  SKIN: Warm and dry NEUROLOGIC:  Alert and oriented x 3 PSYCHIATRIC:  Normal affect   ASSESSMENT:    1. Preoperative examination   2. Mitral valve insufficiency, unspecified etiology   3. Carotid stenosis, asymptomatic, unspecified laterality   4. Coronary artery disease of native artery of native heart with stable angina pectoris ([place])   5. Essential hypertension   6. Hyperlipidemia LDL goal <70   7. Peripheral vascular disease ([place])    PLAN:    1. Preoperative  assessment: patient is planned for minimally invasive BSO 02/27/2019. She continues to be quite active and can easily complete 4 METS without anginal complaints.  acceptable risk without further testing. Chronic stable angina without any nitro use in the past month. EKG today with sinus rhythm with non-specific T wave abnormalities, no STE/D. He revised cardiac risk index score is 1 (ischemic heart disease history) with a 6% risk of adverse cardiac events in the perioperative setting.  - Based on ACC/AHA guidelines, the patient would be at acceptable risk for the planned procedure without further cardiovascular testing.  - Will route this to Dr. Serita Grit office via the Epic fax function.  2. CAD s/p CABG: 2/2 patent grafts on last [facility] 02/2018. She has chronic stable angina for which she has taken a total of 25 SL nitro tabs over the past 6 months, always with relief of symptoms after 1 tab. She still walks 3-5 miles per day without limitations or significant anginal complaints.  - Will increase imdur to 60mg  daily  - Continue aspirin and statin - Continue metoprolol succinate and lisinopril/HCTZ - SL nitro prescription renewed  3. HTN: BP well controlled  - Continue metoprolol succinate, lisinopril/HCTZ, and imdur  4. Carotid artery disease: s/p L CEA; duplex 08/2018 with 40-59% right ICA stenosis - Continue routine annual carotid duplex monitoring (due 08/2019)  5. Mitral regurgitation: mild on echo 02/2018 - Will repeat echo for annual monitoring. This can be completed after surgery.   6. HLD: LDL 69 02/2018 - Continue crestor  7. Peripheral vascular disease: managed by Dr. Donzetta Matters. Off plavix at this time - Continue aspirin and statin - Continue routine follow-up with vascular surgery   Medication Adjustments/Labs and Tests Ordered: Current medicines are reviewed at length with the patient today.  Concerns regarding medicines are outlined above.  Orders Placed  This Encounter  Procedures  .  ECHOCARDIOGRAM COMPLETE   Meds ordered this encounter  Medications  . nitroGLYCERIN (NITROSTAT) 0.4 MG SL tablet    Sig: Place 1 tablet (0.4 mg total) under the tongue every 5 (five) minutes as needed for chest pain. 1 tablet under tongue every 5 minutes as needed for chest discomfort (max 2 Tab/day)    Dispense:  25 tablet    Refill:  4  . isosorbide mononitrate (IMDUR) 60 MG 24 hr tablet    Sig: Take 1 tablet (60 mg total) by mouth daily.    Dispense:  30 tablet    Refill:  5    Signed, Abigail Butts, PA-C  02/24/2019 2:03 PM    [place]

## 2019-02-24 ENCOUNTER — Other Ambulatory Visit (HOSPITAL_COMMUNITY)
Admission: RE | Admit: 2019-02-24 | Discharge: 2019-02-24 | Disposition: A | Discharge status: Home / Self Care | Payer: Medicare Other | Source: Ambulatory Visit | Attending: Gynecologic Oncology | Admitting: Gynecologic Oncology

## 2019-02-24 ENCOUNTER — Ambulatory Visit (INDEPENDENT_AMBULATORY_CARE_PROVIDER_SITE_OTHER): Payer: Medicare Other | Admitting: Medical

## 2019-02-24 ENCOUNTER — Telehealth: Payer: Medicare Other | Admitting: Adult Health

## 2019-02-24 ENCOUNTER — Other Ambulatory Visit: Payer: Self-pay

## 2019-02-24 ENCOUNTER — Encounter: Payer: Self-pay | Admitting: Physician Assistant

## 2019-02-24 VITALS — BP 107/62 | HR 66 | Temp 97.2°F | Ht 65.0 in | Wt 150.6 lb

## 2019-02-24 DIAGNOSIS — Z01818 Encounter for other preprocedural examination: Secondary | ICD-10-CM | POA: Diagnosis not present

## 2019-02-24 DIAGNOSIS — I739 Peripheral vascular disease, unspecified: Secondary | ICD-10-CM

## 2019-02-24 DIAGNOSIS — I340 Nonrheumatic mitral (valve) insufficiency: Secondary | ICD-10-CM

## 2019-02-24 DIAGNOSIS — I6529 Occlusion and stenosis of unspecified carotid artery: Secondary | ICD-10-CM | POA: Diagnosis not present

## 2019-02-24 DIAGNOSIS — I25118 Atherosclerotic heart disease of native coronary artery with other forms of angina pectoris: Secondary | ICD-10-CM

## 2019-02-24 DIAGNOSIS — I10 Essential (primary) hypertension: Secondary | ICD-10-CM

## 2019-02-24 DIAGNOSIS — E785 Hyperlipidemia, unspecified: Secondary | ICD-10-CM

## 2019-02-24 MED ORDER — ISOSORBIDE MONONITRATE ER 60 MG PO TB24: 60.0000 mg | ORAL_TABLET | Freq: Every day | ORAL | 5 refills | Status: DC

## 2019-02-24 MED ORDER — NITROGLYCERIN 0.4 MG SL SUBL: 0.4000 mg | SUBLINGUAL_TABLET | SUBLINGUAL | 4 refills | Status: DC | PRN

## 2019-02-24 NOTE — Patient Instructions (Signed)
Medication Instructions:   Increase Isosorbide to 60 mg daily.  If you need a refill on your cardiac medications before your next appointment, please call your pharmacy.   Testing/Procedures: Your physician has requested that you have an echocardiogram. Echocardiography is a painless test that uses sound waves to create images of your heart. It provides your doctor with information about the size and shape of your heart and how well your heart's chambers and valves are working. This procedure takes approximately one hour. There are no restrictions for this procedure.  Follow-Up: At [facility], you and your health needs are our priority.  As part of our continuing mission to provide you with exceptional heart care, we have created designated Provider Care Teams.  These Care Teams include your primary Cardiologist (physician) and Advanced Practice Providers (APPs -  Physician Assistants and Nurse Practitioners) who all work together to provide you with the care you need, when you need it. You will need a follow up appointment in 6 months.  Please call our office 2 months in advance to schedule this appointment.  You may see Kirk Ruths, MD or one of the following Advanced Practice Providers on your designated Care Team:   Kerin Ransom, PA-C Roby Lofts, [place] . Sande Rives, PA-C  Any Other Special Instructions Will Be Listed Below (If Applicable). None

## 2019-02-25 ENCOUNTER — Other Ambulatory Visit (INDEPENDENT_AMBULATORY_CARE_PROVIDER_SITE_OTHER): Payer: Medicare Other

## 2019-02-25 ENCOUNTER — Telehealth: Payer: Self-pay

## 2019-02-25 ENCOUNTER — Telehealth: Payer: Self-pay | Admitting: Gynecologic Oncology

## 2019-02-25 DIAGNOSIS — I10 Essential (primary) hypertension: Secondary | ICD-10-CM | POA: Diagnosis not present

## 2019-02-25 DIAGNOSIS — E0590 Thyrotoxicosis, unspecified without thyrotoxic crisis or storm: Secondary | ICD-10-CM

## 2019-02-25 DIAGNOSIS — I739 Peripheral vascular disease, unspecified: Secondary | ICD-10-CM | POA: Diagnosis not present

## 2019-02-25 DIAGNOSIS — I2510 Atherosclerotic heart disease of native coronary artery without angina pectoris: Secondary | ICD-10-CM

## 2019-02-25 DIAGNOSIS — I340 Nonrheumatic mitral (valve) insufficiency: Secondary | ICD-10-CM

## 2019-02-25 NOTE — Progress Notes (Signed)
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 Everitt Amber.   Cleared by cardiology 02/24/2019.  Per Roby Lofts, PA-C, "She continues to be quite active and can easily complete 4 METS without anginal complaints.  acceptable risk without further testing. Chronic stable angina without any nitro use in the past month. EKG today with sinus rhythm with non-specific T wave abnormalities, no STE/D. He revised cardiac risk index score is 1 (ischemic heart disease history) with a 6% risk of adverse cardiac events in the perioperative setting.  Based on ACC/AHA guidelines, the patient would be at acceptable risk for the planned procedure without further cardiovascular testing."  Anticipate pt can proceed with planned procedure barring acute status change.  VS: BP (!) 132/59   Pulse 60   Temp 36.9 C (Oral)   Resp 16   Ht 5\' 5"  (1.651 m)   Wt 68.9 kg   SpO2 100%   BMI 25.29 kg/m   PROVIDERS: [name], MD is PCP   Kirk Ruths, MD is Cardiologist  LABS: Labs reviewed: Acceptable for surgery. (all labs ordered are listed, but only abnormal results are displayed)  Labs Reviewed  CBC - Abnormal; Notable for the following components:      Result Value   RBC 3.73 (*)    Hemoglobin 11.8 (*)    All other components within normal limits  COMPREHENSIVE METABOLIC PANEL  URINALYSIS, ROUTINE W REFLEX MICROSCOPIC  TYPE AND SCREEN  ABO/RH     IMAGES: Carotid US  08/13/18 Summary: Right Carotid: Velocities in the right ICA are consistent with a 40-59%                stenosis. Hemodynamically significant plaque >50% visualized in                the CCA. The ECA appears >50% stenosed. The RICA velocities are                elevated and have increased compared to the prior exam.  Left Carotid: Velocities in the left ICA are consistent with a 1-39% stenosis.               Non-hemodynamically significant plaque noted in the CCA. The LICA               velocities remain within normal range and stable when compared to               the prior exam, s/p endarterectomy.  EKG: 03/26/18 Rate 59 bpm Sinus bradycardia  Nonspecific T wave abnormality  CV: Cardiac Cath 02/21/18  The left ventricular systolic function is normal.  LV end diastolic pressure is normal.  The left ventricular ejection fraction is 55-65% by visual estimate.  Ost LM to Mid LM lesion is 80% stenosed.  Dist RCA lesion is 100% stenosed.  Prox RCA lesion is 30% stenosed.  Ost LAD to Prox LAD lesion is 50% stenosed.  Prox LAD to Mid LAD lesion  is 50% stenosed.  Ost Ramus to Ramus lesion is 95% stenosed.  Ost 1st Mrg lesion is 100% stenosed.  Origin to Prox Graft lesion before Ramus is 100% stenosed. Per Quay Burow, MD, "Her anatomy is remarkable for an occluded sequential vein graft to the ramus and obtuse marginal branch.  The occlusion was at the aorta.  She did this does have high-grade distal left main disease.  LIMA is intact as is her vein graft to the distal right supplying the PDA and PLA branches.  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  At this point, I recommend optimal antianginal medical therapy.  I was hesitant to open up an 65 year old vein graft for fear of distal embolization."  Echo 02/20/18 Study Conclusions  -  Left ventricle: The cavity size was normal. There was mild   concentric hypertrophy. Systolic function was normal. The   estimated ejection fraction was in the range of 60% to 65%. Wall   motion was normal; there were no regional wall motion   abnormalities. Features are consistent with a pseudonormal left   ventricular filling pattern, with concomitant abnormal relaxation   and increased filling pressure (grade 2 diastolic dysfunction). - Mitral valve: There was mild regurgitation directed centrally. - Left atrium: The atrium was mildly dilated.  Stress Test 07/30/15  The left ventricular ejection fraction is normal (55-65%).  Nuclear stress EF: 64%.  Defect 1: There is a small defect of mild severity present in the apical anterior location.  This is a low risk study.  Past Medical History:  Diagnosis Date  . Anxiety   . Cancer [facility])    breast cancer  . Carotid artery stenosis   . CHF (congestive heart failure) ([place])   . Complication of anesthesia   . Coronary artery disease 2001  . Depression   . Family history of lung cancer   . Hx of CABG 2001  . Hypercholesteremia   . Hypertension   . Hyperthyroidism   . PONV (postoperative nausea and vomiting)   . Stroke [facility])    short term memory loss    Past Surgical History:  Procedure Laterality Date  . ABDOMINAL AORTOGRAM W/LOWER EXTREMITY N/A 06/13/2017   Procedure: ABDOMINAL AORTOGRAM W/LOWER EXTREMITY;  Surgeon: Waynetta Sandy, MD;  Location: [place] [facility];  Service: Cardiovascular;  Laterality: N/A;  Bilateral  . ABDOMINAL HYSTERECTOMY    . BREAST LUMPECTOMY  1999  . BREAST LUMPECTOMY     left breast  . CAROTID ENDARTERECTOMY Left 04/2000  . CORONARY ARTERY BYPASS GRAFT  2001   x 5 (left internal mammary artery to left anterior  descending coronary artery  . LEFT HEART CATH AND CORS/GRAFTS ANGIOGRAPHY N/A 02/21/2018   Procedure: LEFT HEART CATH AND CORS/GRAFTS ANGIOGRAPHY;  Surgeon: Lorretta Harp,  MD;  Location: [place] [facility];  Service: Cardiovascular;  Laterality: N/A;  . MASTECTOMY Bilateral   . PERIPHERAL VASCULAR INTERVENTION  06/13/2017   Procedure: PERIPHERAL VASCULAR INTERVENTION;  Surgeon: Waynetta Sandy, MD;  Location: [place] [facility];  Service: Cardiovascular;;  Bilateral Iliac Stents  . TOTAL MASTECTOMY Bilateral 11/07/2018   Procedure: BILATERAL MASTECTOMIES;  Surgeon: Stark Klein, MD;  Location: [place];  Service: General;  Laterality: Bilateral;    MEDICATIONS: . amLODipine (NORVASC) 10 MG tablet  . aspirin 81 MG tablet  . Carboxymethylcellulose  Sodium (REFRESH PLUS OP)  . Cholecalciferol (VITAMIN D-3) 1000 units CAPS  . hyoscyamine (LEVSIN) 0.125 MG tablet  . ibuprofen (ADVIL) 600 MG tablet  . isosorbide mononitrate (IMDUR) 60 MG 24 hr tablet  . lisinopril-hydrochlorothiazide (PRINZIDE,ZESTORETIC) 20-25 MG tablet  . metoprolol succinate (TOPROL-XL) 100 MG 24 hr tablet  . montelukast (SINGULAIR) 10 MG tablet  . Multiple Vitamins-Calcium (ONE-A-DAY WOMENS PO)  . nitroGLYCERIN (NITROSTAT) 0.4 MG SL tablet  . Potassium Chloride ER 20 MEQ TBCR  . rosuvastatin (CRESTOR) 20 MG tablet  . senna-docusate (SENOKOT-S) 8.6-50 MG tablet  . simethicone (GAS-X) 80 MG chewable tablet  . traMADol (ULTRAM) 50 MG tablet  . venlafaxine XR (EFFEXOR-XR) 37.5 MG 24 hr capsule   No current facility-administered medications for this encounter.     Maia Plan [facility] Pre-Surgical Testing [phone] 02/25/19 11:40 AM

## 2019-02-25 NOTE — Telephone Encounter (Signed)
Spoke with Ms Arena to see if she has been putting the medication clopidogrel in her medication boxes.  Pt stated that she did put in medication boxes.  She took a pill today. Told her that this medication is her Plavix She needed to hold this medication 7 days prior to 02-27-19 surgery.  Pt stated that her memory with instructions is not as good with the stroke and various medications she is on.  She can get confused.  Her daughter was not able to come to her house to review instructions for pre op with her. Surgery was r/s to 03-13-19. Pt to take last dose of Plavix on 03-05-19.  Pt to put message in phone. Reviewed with Melissa Cross,NP.  Will have Elmo Putt Nurse Navigator call pt on 03-05-19 to review Plavix stoppage and make sure  covid testing is scheduled and pt aware.  Pt appreciative of this follow up.

## 2019-02-25 NOTE — Telephone Encounter (Signed)
Called to check on patient pre-op.  Informed her that we had received her cardiac clearance from surgery.  She states she has been out of her plavix and she is pretty sure she has been off of it for over a week.  She has continued taking her aspirin.  All questions answered in regards to her upcoming procedure. Advised to call for any questions or concerns.

## 2019-02-25 NOTE — Anesthesia Preprocedure Evaluation (Addendum)
Anesthesia Evaluation  Patient identified by MRN, date of birth, ID band Patient awake    Reviewed: Allergy & Precautions, NPO status , Patient's Chart, lab work & pertinent test results, reviewed documented beta blocker date and time   History of Anesthesia Complications (+) PONV  Airway Mallampati: II  TM Distance: >3 FB Neck ROM: Full    Dental  (+) Edentulous Upper, Partial Upper,    Pulmonary former smoker,    Pulmonary exam normal        Cardiovascular hypertension, Pt. on medications and Pt. on home beta blockers + CAD, + CABG (2001), + Peripheral Vascular Disease and +CHF  Normal cardiovascular exam  S/P left carotid endarterectomy 2001  TTE 2019: mild concentric hypertrophy, EF 37-62%, grade 2 diastolic dysfunction, mild MR, mild LAE    Neuro/Psych Anxiety Depression CVA ("short term memory loss"), Residual Symptoms    GI/Hepatic negative GI ROS, Neg liver ROS,   Endo/Other  negative endocrine ROS  Renal/GU negative Renal ROS     Musculoskeletal negative musculoskeletal ROS (+)   Abdominal   Peds  Hematology negative hematology ROS (+)   Anesthesia Other Findings Hx of breast ca s/p bilateral mastectomies  Reproductive/Obstetrics                           Anesthesia Physical Anesthesia Plan  ASA: III  Anesthesia Plan: General   Post-op Pain Management:    Induction: Intravenous  PONV Risk Score and Plan: 4 or greater and Treatment may vary due to age or medical condition, Ondansetron, Dexamethasone and Midazolam  Airway Management Planned: Oral ETT  Additional Equipment:   Intra-op Plan:   Post-operative Plan: Extubation in OR  Informed Consent: I have reviewed the patients History and Physical, chart, labs and discussed the procedure including the risks, benefits and alternatives for the proposed anesthesia with the patient or authorized representative who has  indicated his/her understanding and acceptance.     Dental advisory given  Plan Discussed with: CRNA  Anesthesia Plan Comments: (See PAT note 02/21/2019, Konrad Felix, PA-C)      Anesthesia Quick Evaluation

## 2019-02-26 ENCOUNTER — Encounter: Payer: Self-pay | Admitting: Gynecologic Oncology

## 2019-02-26 ENCOUNTER — Telehealth: Payer: Self-pay | Admitting: Oncology

## 2019-02-26 LAB — NOVEL CORONAVIRUS, NAA (HOSP ORDER, SEND-OUT TO REF LAB; TAT 18-24 HRS): SARS-CoV-2, NAA: NOT DETECTED

## 2019-02-26 NOTE — Telephone Encounter (Signed)
Left a message for Brenda [name] regarding stopping her plavix now before surgery. Requested a return call.

## 2019-02-26 NOTE — Telephone Encounter (Addendum)
Called Brenda [name] again and advised her to stop taking clopidogrel/plavix now per Dr. Denman George.  She said she is taking it out of her medication box now and will not take it again before surgery. Also advised her to check with Dr. Donzetta Matters after surgery to see if she needs to restart it.  She verbalized understanding and agreement.

## 2019-02-27 LAB — TYPE AND SCREEN
ABO/RH(D): A POS
Antibody Screen: NEGATIVE

## 2019-03-04 ENCOUNTER — Telehealth: Payer: Self-pay | Admitting: Oncology

## 2019-03-04 NOTE — Telephone Encounter (Signed)
Called Brenda [name] to make sure she is not taking Plavix.  She said she took it out of her medication box the last time we talked and she hasn't taken any since.

## 2019-03-06 ENCOUNTER — Telehealth (HOSPITAL_COMMUNITY): Payer: Self-pay | Admitting: Cardiology

## 2019-03-06 NOTE — Telephone Encounter (Signed)

## 2019-03-07 ENCOUNTER — Other Ambulatory Visit (HOSPITAL_COMMUNITY): Payer: Medicare Other

## 2019-03-10 ENCOUNTER — Other Ambulatory Visit (HOSPITAL_COMMUNITY): Payer: Medicare Other

## 2019-03-10 ENCOUNTER — Other Ambulatory Visit (HOSPITAL_COMMUNITY)
Admission: RE | Admit: 2019-03-10 | Discharge: 2019-03-10 | Disposition: A | Discharge status: Home / Self Care | Payer: Medicare Other | Source: Ambulatory Visit | Attending: Gynecologic Oncology | Admitting: Gynecologic Oncology

## 2019-03-10 DIAGNOSIS — Z01812 Encounter for preprocedural laboratory examination: Secondary | ICD-10-CM | POA: Insufficient documentation

## 2019-03-10 DIAGNOSIS — Z1159 Encounter for screening for other viral diseases: Secondary | ICD-10-CM | POA: Diagnosis not present

## 2019-03-10 LAB — SARS CORONAVIRUS 2 (TAT 6-24 HRS): SARS Coronavirus 2: NEGATIVE

## 2019-03-10 NOTE — Telephone Encounter (Signed)
Called pt she states that she was unable to make it to her ECHO appt 03-07-2019. She is calling to reschedule after her upcoming surgery.   Message sent to scheduling to call pt and reschedule

## 2019-03-11 ENCOUNTER — Encounter (HOSPITAL_BASED_OUTPATIENT_CLINIC_OR_DEPARTMENT_OTHER): Payer: Self-pay | Admitting: *Deleted

## 2019-03-12 ENCOUNTER — Telehealth: Payer: Self-pay | Admitting: Oncology

## 2019-03-12 ENCOUNTER — Encounter (HOSPITAL_BASED_OUTPATIENT_CLINIC_OR_DEPARTMENT_OTHER): Payer: Self-pay | Admitting: *Deleted

## 2019-03-12 ENCOUNTER — Other Ambulatory Visit: Payer: Self-pay

## 2019-03-12 NOTE — Progress Notes (Addendum)
Spoke w/ pt via phone for pre-op interview.  This pt was scheduled at [facility] main 02-27-2019 was rescheduled to [facility] 03-13-2019 because pt had not stopped her plavix.  Per pt last dose plavix 2 wks ago, managed by dr Donzetta Matters (vascular).   Pt verbalized understanding to start light diet today until mn then clear liquids until 0430 then nothing by mouth.  Pt has handout instructions from previous PAT visit with light diet and list of clear liquids.     Arrive at 0530.  Needs istat 8 and T&S.  Current ekg in chart and epic.  Pt had negative covid test done 03-10-2019.  Pt will do hibiclens shower tonight and am dos, as per previous instructions.   Pt given address and direction's to [facility].    The following with chart:  signed blood consent dated 02-21-2019,  signed consent dated 02-21-2019 but has [facility] on it.  New informed consent order in epic.  Cardiac clearance/ note dated 02-24-2019 in epic and chart, krista kroeger PA.  Chart reviewed w/ anesthesia Konrad Felix PA.  Stated ok to proceed at [facility].  Pt states has not taken any nitro in past several months for chronic angina.

## 2019-03-12 NOTE — Telephone Encounter (Signed)
Called Brenda [name] to see if she has any questions about surgery tomorrow.  She said she has not taken Plavix and has it set aside so she won't take it.  Discussed that she needs to arrive for surgery at 5:30 am.  Also talked about following a light diet today and then not eating or drinking after midnight.  She verbalized understanding and agreement.

## 2019-03-12 NOTE — Progress Notes (Signed)

## 2019-03-13 ENCOUNTER — Ambulatory Visit (HOSPITAL_BASED_OUTPATIENT_CLINIC_OR_DEPARTMENT_OTHER): Payer: Medicare Other | Admitting: Physician Assistant

## 2019-03-13 ENCOUNTER — Encounter (HOSPITAL_BASED_OUTPATIENT_CLINIC_OR_DEPARTMENT_OTHER): Payer: Self-pay | Admitting: Anesthesiology

## 2019-03-13 ENCOUNTER — Other Ambulatory Visit: Payer: Self-pay

## 2019-03-13 ENCOUNTER — Ambulatory Visit (HOSPITAL_BASED_OUTPATIENT_CLINIC_OR_DEPARTMENT_OTHER)
Admission: RE | Admit: 2019-03-13 | Discharge: 2019-03-13 | Disposition: A | Discharge status: Home / Self Care | Payer: Medicare Other | Attending: Gynecologic Oncology | Admitting: Gynecologic Oncology

## 2019-03-13 ENCOUNTER — Encounter (HOSPITAL_BASED_OUTPATIENT_CLINIC_OR_DEPARTMENT_OTHER)
Admission: RE | Disposition: A | Discharge status: Home / Self Care | Payer: Self-pay | Source: Home / Self Care | Attending: Gynecologic Oncology

## 2019-03-13 DIAGNOSIS — I2510 Atherosclerotic heart disease of native coronary artery without angina pectoris: Secondary | ICD-10-CM | POA: Insufficient documentation

## 2019-03-13 DIAGNOSIS — Z951 Presence of aortocoronary bypass graft: Secondary | ICD-10-CM | POA: Diagnosis not present

## 2019-03-13 DIAGNOSIS — F419 Anxiety disorder, unspecified: Secondary | ICD-10-CM | POA: Diagnosis not present

## 2019-03-13 DIAGNOSIS — Z1509 Genetic susceptibility to other malignant neoplasm: Secondary | ICD-10-CM

## 2019-03-13 DIAGNOSIS — Z9013 Acquired absence of bilateral breasts and nipples: Secondary | ICD-10-CM | POA: Insufficient documentation

## 2019-03-13 DIAGNOSIS — Z7982 Long term (current) use of aspirin: Secondary | ICD-10-CM | POA: Insufficient documentation

## 2019-03-13 DIAGNOSIS — Z853 Personal history of malignant neoplasm of breast: Secondary | ICD-10-CM | POA: Diagnosis not present

## 2019-03-13 DIAGNOSIS — D0592 Unspecified type of carcinoma in situ of left breast: Secondary | ICD-10-CM

## 2019-03-13 DIAGNOSIS — I509 Heart failure, unspecified: Secondary | ICD-10-CM | POA: Insufficient documentation

## 2019-03-13 DIAGNOSIS — Z7901 Long term (current) use of anticoagulants: Secondary | ICD-10-CM | POA: Insufficient documentation

## 2019-03-13 DIAGNOSIS — Z79899 Other long term (current) drug therapy: Secondary | ICD-10-CM | POA: Diagnosis not present

## 2019-03-13 DIAGNOSIS — Z1502 Genetic susceptibility to malignant neoplasm of ovary: Secondary | ICD-10-CM

## 2019-03-13 DIAGNOSIS — I739 Peripheral vascular disease, unspecified: Secondary | ICD-10-CM | POA: Insufficient documentation

## 2019-03-13 DIAGNOSIS — Z8673 Personal history of transient ischemic attack (TIA), and cerebral infarction without residual deficits: Secondary | ICD-10-CM | POA: Insufficient documentation

## 2019-03-13 DIAGNOSIS — Z87891 Personal history of nicotine dependence: Secondary | ICD-10-CM | POA: Diagnosis not present

## 2019-03-13 DIAGNOSIS — F329 Major depressive disorder, single episode, unspecified: Secondary | ICD-10-CM | POA: Insufficient documentation

## 2019-03-13 DIAGNOSIS — Z1501 Genetic susceptibility to malignant neoplasm of breast: Secondary | ICD-10-CM

## 2019-03-13 HISTORY — DX: Other forms of angina pectoris: I20.89

## 2019-03-13 HISTORY — DX: Unspecified sequelae of cerebral infarction: I69.30

## 2019-03-13 HISTORY — DX: Malignant neoplasm of unspecified site of left female breast: C50.912

## 2019-03-13 HISTORY — DX: Presence of aortocoronary bypass graft: Z95.1

## 2019-03-13 HISTORY — DX: Peripheral vascular disease, unspecified: I73.9

## 2019-03-13 HISTORY — DX: Nonrheumatic mitral (valve) insufficiency: I34.0

## 2019-03-13 HISTORY — PX: ROBOTIC ASSISTED SALPINGO OOPHERECTOMY: SHX6082

## 2019-03-13 HISTORY — DX: Other forms of angina pectoris: I20.8

## 2019-03-13 HISTORY — DX: Hyperlipidemia, unspecified: E78.5

## 2019-03-13 LAB — POCT I-STAT, CHEM 8
BUN: 22 mg/dL (ref 8–23)
Calcium, Ion: 1.05 mmol/L — ABNORMAL LOW (ref 1.15–1.40)
Chloride: 109 mmol/L (ref 98–111)
Creatinine, Ser: 0.9 mg/dL (ref 0.44–1.00)
Glucose, Bld: 96 mg/dL (ref 70–99)
HCT: 27 % — ABNORMAL LOW (ref 36.0–46.0)
Hemoglobin: 9.2 g/dL — ABNORMAL LOW (ref 12.0–15.0)
Potassium: 4.5 mmol/L (ref 3.5–5.1)
Sodium: 136 mmol/L (ref 135–145)
TCO2: 20 mmol/L — ABNORMAL LOW (ref 22–32)

## 2019-03-13 LAB — TYPE AND SCREEN
ABO/RH(D): A POS
Antibody Screen: NEGATIVE

## 2019-03-13 SURGERY — SALPINGO-OOPHORECTOMY, ROBOT-ASSISTED
Anesthesia: General | Site: Abdomen | Laterality: Bilateral

## 2019-03-13 MED ORDER — ONDANSETRON HCL 4 MG/2ML IJ SOLN
INTRAMUSCULAR | Status: DC | PRN
  Administered 2019-03-13: 4 mg via INTRAVENOUS

## 2019-03-13 MED ORDER — LACTATED RINGERS IV SOLN
INTRAVENOUS | Status: DC
  Filled 2019-03-13: qty 1000

## 2019-03-13 MED ORDER — LACTATED RINGERS IV SOLN
INTRAVENOUS | Status: DC
  Administered 2019-03-13: 10:00:00 via INTRAVENOUS
  Administered 2019-03-13: 1000 mL via INTRAVENOUS
  Filled 2019-03-13: qty 1000

## 2019-03-13 MED ORDER — DEXAMETHASONE SODIUM PHOSPHATE 10 MG/ML IJ SOLN
INTRAMUSCULAR | Status: AC
  Filled 2019-03-13: qty 1

## 2019-03-13 MED ORDER — ACETAMINOPHEN 500 MG PO TABS
1000.0000 mg | ORAL_TABLET | ORAL | Status: AC
  Administered 2019-03-13: 1000 mg via ORAL
  Filled 2019-03-13: qty 2

## 2019-03-13 MED ORDER — FENTANYL CITRATE (PF) 100 MCG/2ML IJ SOLN
25.0000 ug | INTRAMUSCULAR | Status: DC | PRN
  Administered 2019-03-13 (×2): 25 ug via INTRAVENOUS
  Filled 2019-03-13: qty 1

## 2019-03-13 MED ORDER — STERILE WATER FOR IRRIGATION IR SOLN
Status: DC | PRN
  Administered 2019-03-13: 1000 mL

## 2019-03-13 MED ORDER — ACETAMINOPHEN 10 MG/ML IV SOLN
1000.0000 mg | Freq: Once | INTRAVENOUS | Status: DC | PRN
  Filled 2019-03-13: qty 100

## 2019-03-13 MED ORDER — ATROPINE SULFATE 0.4 MG/ML IJ SOLN
INTRAMUSCULAR | Status: AC
  Filled 2019-03-13: qty 1

## 2019-03-13 MED ORDER — OXYCODONE HCL 5 MG PO TABS
5.0000 mg | ORAL_TABLET | ORAL | Status: DC | PRN
  Filled 2019-03-13: qty 2

## 2019-03-13 MED ORDER — ACETAMINOPHEN 325 MG PO TABS
650.0000 mg | ORAL_TABLET | ORAL | Status: DC | PRN
  Filled 2019-03-13: qty 2

## 2019-03-13 MED ORDER — PROPOFOL 10 MG/ML IV BOLUS
INTRAVENOUS | Status: DC | PRN
  Administered 2019-03-13: 100 mg via INTRAVENOUS
  Administered 2019-03-13: 30 mg via INTRAVENOUS

## 2019-03-13 MED ORDER — EPHEDRINE SULFATE-NACL 50-0.9 MG/10ML-% IV SOSY
PREFILLED_SYRINGE | INTRAVENOUS | Status: DC | PRN
  Administered 2019-03-13: 25 mg via INTRAVENOUS

## 2019-03-13 MED ORDER — ACETAMINOPHEN 500 MG PO TABS
ORAL_TABLET | ORAL | Status: AC
  Filled 2019-03-13: qty 2

## 2019-03-13 MED ORDER — PHENYLEPHRINE HCL (PRESSORS) 10 MG/ML IV SOLN
INTRAVENOUS | Status: DC | PRN
  Administered 2019-03-13: 80 ug via INTRAVENOUS
  Administered 2019-03-13 (×2): 120 ug via INTRAVENOUS
  Administered 2019-03-13: 80 ug via INTRAVENOUS

## 2019-03-13 MED ORDER — SUGAMMADEX SODIUM 200 MG/2ML IV SOLN
INTRAVENOUS | Status: DC | PRN
  Administered 2019-03-13: 500 mg via INTRAVENOUS

## 2019-03-13 MED ORDER — ACETAMINOPHEN 650 MG RE SUPP
650.0000 mg | RECTAL | Status: DC | PRN
  Filled 2019-03-13: qty 1

## 2019-03-13 MED ORDER — PROPOFOL 10 MG/ML IV BOLUS
INTRAVENOUS | Status: AC
  Filled 2019-03-13: qty 40

## 2019-03-13 MED ORDER — FENTANYL CITRATE (PF) 100 MCG/2ML IJ SOLN
INTRAMUSCULAR | Status: DC | PRN
  Administered 2019-03-13 (×2): 50 ug via INTRAVENOUS

## 2019-03-13 MED ORDER — MORPHINE SULFATE (PF) 2 MG/ML IV SOLN
2.0000 mg | INTRAVENOUS | Status: DC | PRN
  Filled 2019-03-13: qty 1

## 2019-03-13 MED ORDER — MIDAZOLAM HCL 2 MG/2ML IJ SOLN
INTRAMUSCULAR | Status: DC | PRN
  Administered 2019-03-13: 1 mg via INTRAVENOUS

## 2019-03-13 MED ORDER — CEFAZOLIN SODIUM-DEXTROSE 2-4 GM/100ML-% IV SOLN
2.0000 g | INTRAVENOUS | Status: AC
  Administered 2019-03-13: 2 g via INTRAVENOUS
  Filled 2019-03-13: qty 100

## 2019-03-13 MED ORDER — ROCURONIUM BROMIDE 10 MG/ML (PF) SYRINGE
PREFILLED_SYRINGE | INTRAVENOUS | Status: AC
  Filled 2019-03-13: qty 10

## 2019-03-13 MED ORDER — SCOPOLAMINE 1 MG/3DAYS TD PT72
1.0000 | MEDICATED_PATCH | TRANSDERMAL | Status: DC
  Administered 2019-03-13: 1.5 mg via TRANSDERMAL
  Filled 2019-03-13: qty 1

## 2019-03-13 MED ORDER — ARTIFICIAL TEARS OPHTHALMIC OINT
TOPICAL_OINTMENT | OPHTHALMIC | Status: AC
  Filled 2019-03-13: qty 3.5

## 2019-03-13 MED ORDER — DEXAMETHASONE SODIUM PHOSPHATE 4 MG/ML IJ SOLN
4.0000 mg | INTRAMUSCULAR | Status: AC
  Administered 2019-03-13: 4 mg via INTRAVENOUS
  Filled 2019-03-13: qty 1

## 2019-03-13 MED ORDER — SUGAMMADEX SODIUM 500 MG/5ML IV SOLN
INTRAVENOUS | Status: AC
  Filled 2019-03-13: qty 5

## 2019-03-13 MED ORDER — GABAPENTIN 300 MG PO CAPS
300.0000 mg | ORAL_CAPSULE | ORAL | Status: AC
  Administered 2019-03-13: 300 mg via ORAL
  Filled 2019-03-13: qty 1

## 2019-03-13 MED ORDER — BUPIVACAINE HCL 0.25 % IJ SOLN
INTRAMUSCULAR | Status: DC | PRN
  Administered 2019-03-13: 10 mL

## 2019-03-13 MED ORDER — ROCURONIUM BROMIDE 10 MG/ML (PF) SYRINGE
PREFILLED_SYRINGE | INTRAVENOUS | Status: DC | PRN
  Administered 2019-03-13: 70 mg via INTRAVENOUS

## 2019-03-13 MED ORDER — CEFAZOLIN SODIUM-DEXTROSE 2-4 GM/100ML-% IV SOLN
INTRAVENOUS | Status: AC
  Filled 2019-03-13: qty 100

## 2019-03-13 MED ORDER — FENTANYL CITRATE (PF) 100 MCG/2ML IJ SOLN
INTRAMUSCULAR | Status: AC
  Filled 2019-03-13: qty 2

## 2019-03-13 MED ORDER — OXYCODONE HCL 5 MG/5ML PO SOLN
5.0000 mg | Freq: Once | ORAL | Status: DC | PRN
  Filled 2019-03-13: qty 5

## 2019-03-13 MED ORDER — LIDOCAINE 2% (20 MG/ML) 5 ML SYRINGE
INTRAMUSCULAR | Status: AC
  Filled 2019-03-13: qty 20

## 2019-03-13 MED ORDER — OXYCODONE HCL 5 MG PO TABS
5.0000 mg | ORAL_TABLET | Freq: Once | ORAL | Status: DC | PRN
  Filled 2019-03-13: qty 1

## 2019-03-13 MED ORDER — PHENYLEPHRINE 40 MCG/ML (10ML) SYRINGE FOR IV PUSH (FOR BLOOD PRESSURE SUPPORT)
PREFILLED_SYRINGE | INTRAVENOUS | Status: AC
  Filled 2019-03-13: qty 10

## 2019-03-13 MED ORDER — SODIUM CHLORIDE 0.9% FLUSH
3.0000 mL | Freq: Two times a day (BID) | INTRAVENOUS | Status: DC
  Filled 2019-03-13: qty 3

## 2019-03-13 MED ORDER — PROMETHAZINE HCL 25 MG/ML IJ SOLN
6.2500 mg | INTRAMUSCULAR | Status: DC | PRN
  Filled 2019-03-13: qty 1

## 2019-03-13 MED ORDER — LIDOCAINE 2% (20 MG/ML) 5 ML SYRINGE
INTRAMUSCULAR | Status: DC | PRN
  Administered 2019-03-13: 60 mg via INTRAVENOUS

## 2019-03-13 MED ORDER — SCOPOLAMINE 1 MG/3DAYS TD PT72
MEDICATED_PATCH | TRANSDERMAL | Status: AC
  Filled 2019-03-13: qty 1

## 2019-03-13 MED ORDER — ONDANSETRON HCL 4 MG/2ML IJ SOLN
INTRAMUSCULAR | Status: AC
  Filled 2019-03-13: qty 2

## 2019-03-13 MED ORDER — GABAPENTIN 300 MG PO CAPS
ORAL_CAPSULE | ORAL | Status: AC
  Filled 2019-03-13: qty 1

## 2019-03-13 MED ORDER — SODIUM CHLORIDE 0.9 % IR SOLN
Status: DC | PRN
  Administered 2019-03-13: 3000 mL

## 2019-03-13 MED ORDER — SODIUM CHLORIDE 0.9% FLUSH
3.0000 mL | INTRAVENOUS | Status: DC | PRN
  Filled 2019-03-13: qty 3

## 2019-03-13 MED ORDER — SODIUM CHLORIDE 0.9 % IV SOLN
250.0000 mL | INTRAVENOUS | Status: DC | PRN
  Filled 2019-03-13: qty 250

## 2019-03-13 MED ORDER — MIDAZOLAM HCL 2 MG/2ML IJ SOLN
INTRAMUSCULAR | Status: AC
  Filled 2019-03-13: qty 2

## 2019-03-13 SURGICAL SUPPLY — 69 items
ADH SKN CLS APL DERMABOND .7 (GAUZE/BANDAGES/DRESSINGS) ×1
AGENT HMST KT MTR STRL THRMB (HEMOSTASIS)
APL ESCP 34 STRL LF DISP (HEMOSTASIS)
APPLICATOR SURGIFLO ENDO (HEMOSTASIS) IMPLANT
BAG LAPAROSCOPIC 12 15 PORT 16 (BASKET) IMPLANT
BAG RETRIEVAL 12/15 (BASKET)
BAG SPEC RTRVL LRG 6X4 10 (ENDOMECHANICALS) ×2
BLADE SURG 10 STRL SS (BLADE) IMPLANT
COVER BACK TABLE 60X90IN (DRAPES) ×2 IMPLANT
COVER TIP SHEARS 8 DVNC (MISCELLANEOUS) ×1 IMPLANT
COVER TIP SHEARS 8MM DA VINCI (MISCELLANEOUS) ×1
COVER WAND RF STERILE (DRAPES) ×2 IMPLANT
DECANTER SPIKE VIAL GLASS SM (MISCELLANEOUS) IMPLANT
DERMABOND ADVANCED (GAUZE/BANDAGES/DRESSINGS) ×1
DERMABOND ADVANCED .7 DNX12 (GAUZE/BANDAGES/DRESSINGS) ×1 IMPLANT
DRAPE ARM DVNC X/XI (DISPOSABLE) ×4 IMPLANT
DRAPE COLUMN DVNC XI (DISPOSABLE) ×1 IMPLANT
DRAPE DA VINCI XI ARM (DISPOSABLE) ×4
DRAPE DA VINCI XI COLUMN (DISPOSABLE) ×1
DRAPE SHEET LG 3/4 BI-LAMINATE (DRAPES) ×2 IMPLANT
DRAPE SURG IRRIG POUCH 19X23 (DRAPES) ×2 IMPLANT
ELECT REM PT RETURN 9FT ADLT (ELECTROSURGICAL) ×2
ELECTRODE REM PT RTRN 9FT ADLT (ELECTROSURGICAL) ×1 IMPLANT
GAUZE 4X4 16PLY RFD (DISPOSABLE) ×2 IMPLANT
GLOVE BIO SURGEON STRL SZ 6 (GLOVE) ×8 IMPLANT
GLOVE BIO SURGEON STRL SZ 6.5 (GLOVE) ×4 IMPLANT
GLOVE BIOGEL PI IND STRL 6.5 (GLOVE) IMPLANT
GLOVE BIOGEL PI IND STRL 7.5 (GLOVE) IMPLANT
GLOVE BIOGEL PI IND STRL 8.5 (GLOVE) IMPLANT
GLOVE BIOGEL PI INDICATOR 6.5 (GLOVE) ×1
GLOVE BIOGEL PI INDICATOR 7.5 (GLOVE) ×1
GLOVE BIOGEL PI INDICATOR 8.5 (GLOVE) ×2
GLOVE INDICATOR 8.5 STRL (GLOVE) ×2 IMPLANT
GLOVE SURG SS PI 6.5 STRL IVOR (GLOVE) ×2 IMPLANT
GOWN STRL REUS W/TWL LRG LVL3 (GOWN DISPOSABLE) ×4 IMPLANT
HOLDER FOLEY CATH W/STRAP (MISCELLANEOUS) ×2 IMPLANT
IRRIG SUCT STRYKERFLOW 2 WTIP (MISCELLANEOUS) ×2
IRRIGATION SUCT STRKRFLW 2 WTP (MISCELLANEOUS) ×1 IMPLANT
KIT PROCEDURE DA VINCI SI (MISCELLANEOUS)
KIT PROCEDURE DVNC SI (MISCELLANEOUS) IMPLANT
KIT TURNOVER CYSTO (KITS) IMPLANT
LATEX FREE FOLEY TRAY ×1 IMPLANT
LEGGING LITHOTOMY PAIR STRL (DRAPES) ×2 IMPLANT
MANIPULATOR UTERINE 4.5 ZUMI (MISCELLANEOUS) ×2 IMPLANT
NDL SPNL 18GX3.5 QUINCKE PK (NEEDLE) IMPLANT
NEEDLE HYPO 22GX1.5 SAFETY (NEEDLE) ×2 IMPLANT
NEEDLE SPNL 18GX3.5 QUINCKE PK (NEEDLE) IMPLANT
OBTURATOR OPTICAL STANDARD 8MM (TROCAR) ×1
OBTURATOR OPTICAL STND 8 DVNC (TROCAR) ×1
OBTURATOR OPTICALSTD 8 DVNC (TROCAR) ×1 IMPLANT
PACK ROBOT GYN CUSTOM WL (TRAY / TRAY PROCEDURE) ×2 IMPLANT
PAD POSITIONING PINK XL (MISCELLANEOUS) ×2 IMPLANT
PENCIL BUTTON HOLSTER BLD 10FT (ELECTRODE) IMPLANT
PORT ACCESS TROCAR AIRSEAL 12 (TROCAR) ×1 IMPLANT
PORT ACCESS TROCAR AIRSEAL 5M (TROCAR) ×1
POUCH SPECIMEN RETRIEVAL 10MM (ENDOMECHANICALS) ×2 IMPLANT
SEAL CANN UNIV 5-8 DVNC XI (MISCELLANEOUS) ×3 IMPLANT
SEAL XI 5MM-8MM UNIVERSAL (MISCELLANEOUS) ×3
SET TRI-LUMEN FLTR TB AIRSEAL (TUBING) ×2 IMPLANT
SURGIFLO W/THROMBIN 8M KIT (HEMOSTASIS) IMPLANT
SUT VIC AB 0 CT1 36 (SUTURE) IMPLANT
SUT VIC AB 4-0 PS2 18 (SUTURE) ×4 IMPLANT
SYR 10ML LL (SYRINGE) IMPLANT
TRAP SPECIMEN MUCOUS 40CC (MISCELLANEOUS) ×1 IMPLANT
TRAY FOLEY W/BAG SLVR 14FR (SET/KITS/TRAYS/PACK) ×2 IMPLANT
TUBE CONNECTING 12X1/4 (SUCTIONS) ×2 IMPLANT
UNDERPAD 30X30 (UNDERPADS AND DIAPERS) ×2 IMPLANT
WATER STERILE IRR 1000ML POUR (IV SOLUTION) ×2 IMPLANT
YANKAUER SUCT BULB TIP NO VENT (SUCTIONS) IMPLANT

## 2019-03-13 NOTE — Transfer of Care (Signed)
I  Last Vitals:  Vitals Value Taken Time  BP 123/62 03/13/19 0930  Temp 36.3 C 03/13/19 0916  Pulse 67 03/13/19 0940  Resp 20 03/13/19 0940  SpO2 100 % 03/13/19 0940  Vitals shown include unvalidated device data.  Last Pain:  Vitals:   03/13/19 0916  TempSrc:   PainSc: 0-No pain      Patients Stated Pain Goal: 4 (03/13/19 9373) Immediate Anesthesia Transfer of Care Note  Patient: Brenda [name]  Procedure(s) Performed: Procedure(s) (LRB): XI ROBOTIC ASSISTED BILATERAL  SALPINGO OOPHORECTOMY (Bilateral)  Patient Location: PACU  Anesthesia Type: General  Level of Consciousness: awake, alert  and oriented  Airway & Oxygen Therapy: Patient Spontanous Breathing and Patient connected to face mask oxygen  Post-op Assessment: Report given to PACU RN and Post -op Vital signs reviewed and stable  Post vital signs: Reviewed and stable  Complications: No apparent anesthesia complications

## 2019-03-13 NOTE — Op Note (Signed)
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  Assistant Surgeon: Lahoma Crocker M.D. (an MD assistant was necessary for tissue manipulation, management of robotic instrumentation, retraction and positioning due to the complexity of the case and hospital policies).   Anesthesia: Gen. endotracheal.  Specimens: Bilateral ovaries, fallopian tubes, pelvic washings  Estimated Blood Loss: 10 mL. 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  Operative Findings: adhesions between omentum and anterior abdominal wall, grossly normal tubes and ovaries bilaterally.  Procedure: The patient's taken to the operating room and placed under general endotracheal anesthesia testing difficulty. She is placed in a dorsolithotomy position and cervical acromial pad was placed. The arms were tucked with care taken to pad the olecranon process. And prepped and draped in usual sterile fashion. A uterine manipulator (zumi) was placed vaginally. A 70m incision was made in the left upper quadrant palmer's point and a 5 mm Optiview trocar used to enter the abdomen under direct visualization. With entry into the abdomen and then maintenance of 15 mm of mercury the patient was placed in Trendelenburg position. An incision was made in the umbilicus and a 138GYtrochar was placed through this site. Two incisions were made lateral to the umbilical incision in the left and right abdomen measuring 851m These incisions were made approximately 10 cm lateral to the umbilical incision. 8 mm robotic trochars were inserted.  The laparoscopic shears were used to take down the omental adhesions with the  anterior abdominal wall using sharp dissection.   The robot was docked.  The abdomen was inspected as was the pelvis.  Pelvic washings were obtained. An incision was made on the right pelvic side wall peritoneum parallel to the IP ligament and the retroperitoneal space entered. The right ureter was identified and the para-rectal space was developed. A window was created in the right broad ligament above the ureter. The right infundibulopelvic vessels were skeletonized cauterized and transected. The distal ovarian attachments to the pelvic side wall similarly were cauterized and transected. Specimen was placed in an Endo Catch bag.  In a similar manner the left peritoneum and the side wall was incised, and the retroperitoneal space entered. The left ureter was identified and the left pararectal space was developed. The left pelvic sidewall attachem was skeletonized cauterized and transected. The left utero-ovarian ligaments were cauterized and transected in the left adnexa was placed in an Endo Catch bag.  The abdomen was copiously irrigated and drained and all operative sites inspected and hemostasis was assured  The robot was undocked. The camera was placed through the left upper abdominal incision. The contents of the left Endo Catch bag were first aspirated and then morcellated to facilitate removal from the abdominal cavity through the umbilical incision. In a similar fashion the contents of the right Endo Catch bag or morcellated to facilitate removal from the abdominal cavity.  The ports were all remove. The fascial closure at the umbilical incision and left upper quadrant port was made with 0 Vicryl.  All incisions were closed with a running subcuticular Monocryl suture. Dermabond was applied. Sponge, lap and needle counts were correct x 3.    The patient had sequential compression devices for VTE prophylaxis.         Disposition: PACU  Condition: stable  Donaciano Eva,  MD

## 2019-03-13 NOTE — H&P (Signed)
Consult Note: Gyn-Onc  Consult was requested by Dr. Vinie Sill for the evaluation of Brenda [name] 63 y.o. female  CC:  No chief complaint on file.   Assessment/Plan:  Brenda [name]  is a 63 y.o.  year old with a deleterious mutation in [place] which confers an increased risk for ovarian cancer. She also has significant coronary, carotid and peripheral vascular disease.  He recently tolerated a bilateral mastectomy and did well with this.  Therefore I think it is reasonable to contemplate a risk reducing BSO.  I think is reasonable for her to stay on 81 mg of aspirin around the time of surgery, however she came off her Plavix 7 days preoperatively.  We will restart this postoperatively if there is no evidence of ongoing bleeding.  I explained to the patient that use of antiplatelet therapy perioperatively increases risks of bleeding complications.  I explained that coming off antiplatelet therapy increases her risk for a coronary, carotid, or peripheral vascular insult.  I also explained that she is at increased risk for stroke and heart attack around the time of surgery.  I explained the route of procedure being a plan robotic approach with minimally invasive bilateral salpingo-oophorectomy.  She may have significant adhesive disease from her history of endometriosis and if this is the case she is at increased risk for bowel bladder and other visceral injury.  I explained operative risks including  bleeding, infection, damage to internal organs (such as bladder,ureters, bowels), blood clot, reoperation and rehospitalization. I explained anticipated postoperative recovery.  She would like to proceed with surgery.  HPI: Brenda [name] is a 63 year old P1 who was seen in consultation at the request of Dr. 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  The patient's cancer history began in the 1990s when she was first diagnosed with  premenopausal breast cancer.  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  Given her history of dual breast cancer she underwent genetic testing on October 08, 2018 which revealed a pathologic variant identified in PAL B2.  The patient's medical history is otherwise significant for peripheral vascular disease (she is undergone bypass surgery with the lower extremities.  She is also had a history of coronary artery bypass graft in 2001, most recently in June 2019 she underwent a left heart catheterization with grafting angiography.  She has known carotid disease and has had TIA in the past.  She has a history of congestive heart disease (is unclear if this is secondary to coronary artery disease, or Adriamycin use, or both).  Her surgical history is significant for abdominal hysterectomy in the 1990s for endometriosis.  She reports that her surgeon, Dr. Harrington Challenger, told her that her endometriosis was behind her uterus.  Her ovaries remained after the surgery.  She is had one prior vaginal delivery. She takes both Plavix and aspirin as antiplatelet therapy for her vascular disease.  Current Meds:  Outpatient Encounter Medications as of 02/05/2019  Medication Sig  . amLODipine (NORVASC) 10 MG tablet Take 1 tablet (10 mg total) by mouth daily.  Marland Kitchen aspirin 81 MG tablet Take 1 tablet (81 mg total) by mouth daily. With Food  . Carboxymethylcellulose Sodium (REFRESH PLUS OP) Place 1-2 drops into both eyes as needed (for dryness or irritation only when wearing contacts).  . Cholecalciferol (VITAMIN D-3) 1000 units CAPS Take 2,000 Units by  mouth daily.  Marland Kitchen HYDROcodone-acetaminophen (NORCO/VICODIN) 5-325 MG tablet Take 1 tablet by mouth every 6 (six) hours as needed for moderate pain.  . hyoscyamine (LEVSIN, ANASPAZ) 0.125 MG tablet Take 1 tablet (0.125 mg total) by mouth every 4 (four) hours as needed.  . isosorbide  mononitrate (IMDUR) 30 MG 24 hr tablet Take 1 tablet (30 mg total) by mouth daily.  Marland Kitchen lisinopril-hydrochlorothiazide (PRINZIDE,ZESTORETIC) 20-25 MG tablet Take 1 tablet by mouth daily.  . metoprolol succinate (TOPROL-XL) 100 MG 24 hr tablet Take 1 tablet (100 mg total) by mouth daily. Take with or immediately following a meal.  . montelukast (SINGULAIR) 10 MG tablet Take 1 tablet (10 mg total) by mouth at bedtime.  . Multiple Vitamins-Calcium (ONE-A-DAY WOMENS PO) Take 1 tablet by mouth daily.  . nitroGLYCERIN (NITROSTAT) 0.4 MG SL tablet 1 tablet under tongue every 5 minutes as needed for chest discomfort (max 2 Tab/day)  . OVER THE COUNTER MEDICATION Take 1 tablet by mouth See admin instructions. Unnamed Back Pain OTC medication: Take 1 tablet by mouth every six hours as needed for back pain  . Potassium Chloride ER 20 MEQ TBCR Take 1 tablet daily on Monday Wednesdays Fridays and Sundays  . rosuvastatin (CRESTOR) 20 MG tablet Take 20 mg by mouth daily.  . simethicone (GAS-X) 80 MG chewable tablet Chew 1 tablet (80 mg total) by mouth every 6 (six) hours as needed for flatulence.  . venlafaxine XR (EFFEXOR-XR) 37.5 MG 24 hr capsule TAKE 1 CAPSULE BY MOUTH ONCE DAILY WITH BREAKFAST  . ibuprofen (ADVIL) 600 MG tablet Take 1 tablet (600 mg total) by mouth every 6 (six) hours as needed. For AFTER surgery  . senna-docusate (SENOKOT-S) 8.6-50 MG tablet Take 2 tablets by mouth at bedtime. For AFTER surgery, do not take if having diarrhea  . traMADol (ULTRAM) 50 MG tablet Take 1 tablet (50 mg total) by mouth every 6 (six) hours as needed. For AFTER surgery, do not take and drive   No facility-administered encounter medications on file as of 02/05/2019.     Allergy:  Allergies  Allergen Reactions  . Adhesive [Tape] Itching  . Latex Rash    Social Hx:   Social History   Socioeconomic History  . Marital status: Single    Spouse name: Not on file  . Number of children: 1  . Years of education: Not  on file  . Highest education level: Not on file  Occupational History  . 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 Financial resource strain: Not on file  . Food insecurity    Worry: Not on file    Inability: Not on file  . Transportation needs    Medical: Not on file    Non-medical: Not on file  Tobacco Use  . Smoking status: Former Smoker    Types: Cigarettes    Quit date: 09/11/2001    Years since quitting: 17.5  . Smokeless tobacco: Never Used  Substance and Sexual Activity  . Alcohol use: No  . Drug use: No  . Sexual activity: Not on file  Lifestyle  . Physical activity    Days per week: Not on file    Minutes per session: Not on file  . Stress: Not on file  Relationships  . Social Herbalist on phone: Not on file    Gets together: Not on file    Attends religious service: Not on file  Active member of club or organization: Not on file    Attends meetings of clubs or organizations: Not on file    Relationship status: Not on file  . Intimate partner violence    Fear of current or ex partner: Not on file    Emotionally abused: Not on file    Physically abused: Not on file    Forced sexual activity: Not on file  Other Topics Concern  . Not on file  Social History Narrative  . Not on file    Past Surgical Hx:  Past Surgical History:  Procedure Laterality Date  . ABDOMINAL AORTOGRAM W/LOWER EXTREMITY N/A 06/13/2017   Procedure: ABDOMINAL AORTOGRAM W/LOWER EXTREMITY;  Surgeon: Waynetta Sandy, MD;  Location: [place] [facility];  Service: Cardiovascular;  Laterality: N/A;  Bilateral  . ABDOMINAL HYSTERECTOMY  1998  . BREAST LUMPECTOMY WITH AXILLARY LYMPH NODE DISSECTION Left 1999  . CARDIAC CATHETERIZATION  04-24-2000  dr Caryl Comes   3V CAD , subtotal PDA w/ slow flow colleterals , ef 48%  . CARDIAC CATHETERIZATION  06/11/2000   3V CAD with progression of LAD disease  . CAROTID ENDARTERECTOMY Left  04-27-2000   dr Amedeo Plenty  @[facility]   . CORONARY ARTERY BYPASS GRAFT  06-12-2000   dr Lucianne Lei tright @[facility]    x 5 (left internal mammary artery to left anterior  descending coronary artery  . LEFT HEART CATH AND CORS/GRAFTS ANGIOGRAPHY N/A 02/21/2018   Procedure: LEFT HEART CATH AND CORS/GRAFTS ANGIOGRAPHY;  Surgeon: Lorretta Harp, MD;  Location: [place] [facility];  Service: Cardiovascular;  Laterality: N/A;  . PERIPHERAL VASCULAR INTERVENTION  06/13/2017   Procedure: PERIPHERAL VASCULAR INTERVENTION;  Surgeon: Waynetta Sandy, MD;  Location: [place] [facility];  Service: Cardiovascular;;  Bilateral Iliac Stents  . TOTAL MASTECTOMY Bilateral 11/07/2018   Procedure: BILATERAL MASTECTOMIES;  Surgeon: Stark Klein, MD;  Location: [place];  Service: General;  Laterality: Bilateral;    Past Medical Hx:  Past Medical History:  Diagnosis Date  . Anxiety   . 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 Carotid artery stenosis    bilateral---  04-27-2000  s/p  left carotid endarterectomy:  last doppler in epic 08-13-2018  right ICA 40-59%,  right ECA >50%,  left ICA 1-39% ,  bilateral subclavians stenotic  . CHF (congestive heart failure) ([place])   . Chronic stable angina ([place])    followed by cardiology  . Complication of anesthesia   . Coronary artery disease cardiologist-- dr Stanford Breed   06-12-2000  s/p  CABG x5  :  cardiac cath 02-21-2018 --  normal LVF, 80% LM, occluded RCA, 95% Ramus and 100% OM,  Patent seqSVG to  PLA-PDA & LIMA to LAD,  seqSVG to OM and RI occluded  . Depression   . Family history of lung cancer   . History of CVA with residual deficit    short term memory loss per pt  . History of Helicobacter pylori infection 2001  . Hyperlipidemia   .  Hypertension   . Hyperthyroidism    dx yrs ago, per pt have taken medication for awhile  . Mild mitral regurgitation    per echo 06/ 2019  . Peripheral vascular disease ([place])    managed  by vascular-- dr Donzetta Matters  . PONV (postoperative nausea and vomiting)   . S/P CABG x 5 06-12-2000   by dr Lucianne Lei tright @[facility]     Past Gynecological History:  SVD x 1 No LMP recorded. Patient has had a hysterectomy.  Family Hx:  Family History  Problem Relation Age of Onset  . Lung cancer Mother 49       died at an early age  . Other Other        There is no Hx of premature coronary artery disease in her family    Review of Systems:  Constitutional  Feels well,    ENT Normal appearing ears and nares bilaterally Skin/Breast  No rash, sores, jaundice, itching, dryness Cardiovascular  No chest pain, shortness of breath, or edema  Pulmonary  No cough or wheeze.  Gastro Intestinal  No nausea, vomitting, or diarrhoea. No bright red blood per rectum, no abdominal pain, change in bowel movement, or constipation.  Genito Urinary  No frequency, urgency, dysuria, no bleeding  Musculo Skeletal  No myalgia, arthralgia, joint swelling or pain  Neurologic  No weakness, numbness, change in gait,  Psychology  No depression, anxiety, insomnia.   Vitals:  Blood pressure (!) 101/59, pulse 62, temperature 98.2 F (36.8 C), temperature source Oral, resp. rate 16, height 5' 5"  (1.651 m), weight 148 lb 14.4 oz (67.5 kg), SpO2 100 %.  Physical Exam: WD in NAD Neck  Supple NROM, without any enlargements.  Lymph Node Survey No cervical supraclavicular or inguinal adenopathy Cardiovascular  Pulse normal rate, regularity and rhythm. S1 and S2 normal.  Lungs  Clear to auscultation bilateraly, without wheezes/crackles/rhonchi. Good air movement.  Skin  No rash/lesions/breakdown  Psychiatry  Alert and oriented to person, place, and time  Abdomen  Normoactive bowel sounds, abdomen soft, non-tender and thin without  evidence of hernia.  Back No CVA tenderness Genito Urinary  Vulva/vagina: Normal external female genitalia.   No lesions. No discharge or bleeding.  Bladder/urethra:  No lesions or masses, well supported bladder  Vagina: normal  Cervix and uterus surgically absnet  Adnexa: no palable masses. Rectal  deferred Extremities  No bilateral cyanosis, clubbing or edema. + scar from CABG graft   Thereasa Solo, MD  03/13/2019, 7:15 AM

## 2019-03-13 NOTE — Anesthesia Procedure Notes (Signed)
Procedure Name: Intubation Date/Time: 03/13/2019 7:52 AM Performed by: Brennan Bailey, MD Pre-anesthesia Checklist: Patient identified, Emergency Drugs available, Suction available and Patient being monitored Patient Re-evaluated:Patient Re-evaluated prior to induction Oxygen Delivery Method: Circle System Utilized Preoxygenation: Pre-oxygenation with 100% oxygen Induction Type: IV induction Ventilation: Mask ventilation without difficulty Laryngoscope Size: Glidescope and 3 Grade View: Grade III Tube type: Oral Tube size: 7.0 mm Number of attempts: 3 Airway Equipment and Method: Video-laryngoscopy Placement Confirmation: ETT inserted through vocal cords under direct vision,  positive ETCO2 and breath sounds checked- equal and bilateral Secured at: 21 cm Tube secured with: Tape Dental Injury: Teeth and Oropharynx as per pre-operative assessment  Difficulty Due To: Difficulty was unanticipated and Difficult Airway- due to anterior larynx Future Recommendations: Recommend- induction with short-acting agent, and alternative techniques readily available Comments: First attempt by CRNA with MAC3 unable to view glottis. Second attempt by myself with Sabra Heck 2 with grade 3 view, anterior airway with redundant soft tissues. Glidescope #3 used with acceptable view. Atraumatic intubation. Mask ventilated between attempts with SpO2 >98% throughout. Recommend Glidescope intubation with future airways. Daiva Huge, MD

## 2019-03-13 NOTE — Discharge Instructions (Signed)
Return to work: 4 weeks (2 weeks with physical restrictions).  Activity: 1. Be up and out of the bed during the day.  Take a nap if needed.  You may walk up steps but be careful and use the hand rail.  Stair climbing will tire you more than you think, you may need to stop part way and rest.   2. No lifting or straining for 4 weeks.  3. No driving for 1 weeks.  Do Not drive if you are taking narcotic pain medicine.  4. Shower daily.  Use soap and water on your incision and pat dry; don't rub.   5. No sexual activity and nothing in the vagina for 8 weeks.  Medications:  - Take ibuprofen and tylenol first line for pain control. Take these regularly (every 6 hours) to decrease the build up of pain.  - If necessary, for severe pain not relieved by ibuprofen, contact Dr Serita Grit office and you will be prescribed percocet.  - While taking percocet you should take sennakot every night to reduce the likelihood of constipation. If this causes diarrhea, stop its use.  Diet: 1. Low sodium Heart Healthy Diet is recommended.  2. It is safe to use a laxative if you have difficulty moving your bowels.   Wound Care: 1. Keep clean and dry.  Shower daily.  Reasons to call the Doctor:   Fever - Oral temperature greater than 100.4 degrees Fahrenheit  Foul-smelling vaginal discharge  Difficulty urinating  Nausea and vomiting  Increased pain at the site of the incision that is unrelieved with pain medicine.  Difficulty breathing with or without chest pain  New calf pain especially if only on one side  Sudden, continuing increased vaginal bleeding with or without clots.   Follow-up: 1. See Everitt Amber in 4 weeks.  Contacts: For questions or concerns you should contact:  Dr. Everitt Amber at [phone] After hours and on week-ends call [phone] and ask to speak to the physician on call for Gynecologic Oncology   Bilateral Salpingo-Oophorectomy, Care After This sheet gives you  information about how to care for yourself after your procedure. Your health care provider may also give you more specific instructions. If you have problems or questions, contact your health care provider. What can I expect after the procedure? After the procedure, it is common to have:  Abdominal pain.  Some occasional vaginal bleeding (spotting).  Tiredness.  Symptoms of menopause, such as hot flashes, night sweats, or mood swings. Follow these instructions at home: Incision care   Keep your incision area and your bandage (dressing) clean and dry.  Follow instructions from your health care provider about how to take care of your incision. Make sure you: ? Wash your hands with soap and water before you change your dressing. If soap and water are not available, use hand sanitizer. ? Change your dressing as told by your health care provider. ? Leave stitches (sutures), staples, skin glue, or adhesive strips in place. These skin closures may need to stay in place for 2 weeks or longer. If adhesive strip edges start to loosen and curl up, you may trim the loose edges. Do not remove adhesive strips completely unless your health care provider tells you to do that.  Check your incision area every day for signs of infection. Check for: ? Redness, swelling, or pain. ? Fluid or blood. ? Warmth. ? Pus or a bad smell. Activity   Do not drive or use heavy machinery  while taking prescription pain medicine.  Do not drive for 24 hours if you received a medicine to help you relax (sedative) during your procedure.  Take frequent, short walks throughout the day. Rest when you get tired. Ask your health care provider what activities are safe for you.  Avoid activity that requires great effort. Also, avoid heavy lifting. Do not lift anything that is heavier than 10 lbs. (4.5 kg), or the limit that your health care provider tells you, until he or she says that it is safe to do so.  Do not douche,  use tampons, or have sex until your health care provider approves. General instructions   To prevent or treat constipation while you are taking prescription pain medicine, your health care provider may recommend that you: ? Drink enough fluid to keep your urine clear or pale yellow. ? Take over-the-counter or prescription medicines. ? Eat foods that are high in fiber, such as fresh fruits and vegetables, whole grains, and beans. ? Limit foods that are high in fat and processed sugars, such as fried and sweet foods.  Take over-the-counter and prescription medicines only as told by your health care provider.  Do not take baths, swim, or use a hot tub until your health care provider approves. Ask your health care provider if you can take showers. You may only be allowed to take sponge baths for bathing.  Wear compression stockings as told by your health care provider. These stockings help to prevent blood clots and reduce swelling in your legs.  Keep all follow-up visits as told by your health care provider. This is important. Contact a health care provider if:  You have pain when you urinate.  You have pus or a bad smelling discharge coming from your vagina.  You have redness, swelling, or pain around your incision.  You have fluid or blood coming from your incision.  Your incision feels warm to the touch.  You have pus or a bad smell coming from your incision.  You have a fever.  Your incision starts to break open.  You have pain in the abdomen, and it gets worse or does not get better when you take medicine.  You develop a rash.  You develop nausea and vomiting.  You feel lightheaded. Get help right away if:  You develop pain in your chest or leg.  You become short of breath.  You faint.  You have increased bleeding from your vagina. Summary  After the procedure, it is common to have pain, bleeding in the vagina, and symptoms of menopause.  Follow instructions  from your health care provider about how to take care of your incision.  Follow instructions from your health care provider about activities and restrictions.  Check your incision every day for signs of infection and report any symptoms to your health care provider. This information is not intended to replace advice given to you by your health care provider. Make sure you discuss any questions you have with your health care provider. Document Released: 08/28/2005 Document Revised: 11/01/2018 Document Reviewed: 10/02/2016 Elsevier Patient Education  [place] Instructions  Activity: Get plenty of rest for the remainder of the day. A responsible individual must stay with you for 24 hours following the procedure.  For the next 24 hours, DO NOT: -Drive a car -Paediatric nurse -Drink alcoholic beverages -Take any medication unless instructed by your physician -Make any legal decisions or sign important papers.  Meals:  Start with liquid foods such as gelatin or soup. Progress to regular foods as tolerated. Avoid greasy, spicy, heavy foods. If nausea and/or vomiting occur, drink only clear liquids until the nausea and/or vomiting subsides. Call your physician if vomiting continues.  Special Instructions/Symptoms: Your throat may feel dry or sore from the anesthesia or the breathing tube placed in your throat during surgery. If this causes discomfort, gargle with warm salt water. The discomfort should disappear within 24 hours.  If you had a scopolamine patch placed behind your ear for the management of post- operative nausea and/or vomiting:  1. The medication in the patch is effective for 72 hours, after which it should be removed.  Wrap patch in a tissue and discard in the trash. Wash hands thoroughly with soap and water. 2. You may remove the patch earlier than 72 hours if you experience unpleasant side effects which may include dry mouth, dizziness or  visual disturbances. 3. Avoid touching the patch. Wash your hands with soap and water after contact with the patch.

## 2019-03-13 NOTE — Anesthesia Postprocedure Evaluation (Signed)
Anesthesia Post Note  Patient: Brenda [name]  Procedure(s) Performed: XI ROBOTIC ASSISTED BILATERAL  SALPINGO OOPHORECTOMY (Bilateral Abdomen)     Patient location during evaluation: PACU Anesthesia Type: General Level of consciousness: awake and alert Pain management: pain level controlled Vital Signs Assessment: post-procedure vital signs reviewed and stable Respiratory status: spontaneous breathing, nonlabored ventilation and respiratory function stable Cardiovascular status: blood pressure returned to baseline and stable Postop Assessment: no apparent nausea or vomiting Anesthetic complications: no    Last Vitals:  Vitals:   03/13/19 1015 03/13/19 1122  BP: 117/61 123/63  Pulse: (!) 59 (!) 56  Resp: 11 14  Temp:    SpO2: 100% 100%    Last Pain:  Vitals:   03/13/19 1115  TempSrc:   PainSc: 0-No pain                 Brennan Bailey

## 2019-03-17 ENCOUNTER — Encounter (HOSPITAL_BASED_OUTPATIENT_CLINIC_OR_DEPARTMENT_OTHER): Payer: Self-pay | Admitting: Gynecologic Oncology

## 2019-03-18 ENCOUNTER — Telehealth: Payer: Self-pay

## 2019-03-18 NOTE — Telephone Encounter (Signed)
-----   Message from Dorothyann Gibbs, NP sent at 03/18/2019  9:40 AM EDT ----- Please let her know path is benign and see how she is

## 2019-03-18 NOTE — Telephone Encounter (Signed)
Told Ms Stahl that the pathology from her surgery was benign as noted below by [facility]. Pt is doing very well.  She is back to most of her daily routine. She walked 3 miles in the last day. Reminded her of the post op appointment on 04-04-19 at 3 pm with Dr. Denman George.

## 2019-03-19 ENCOUNTER — Other Ambulatory Visit: Payer: Self-pay | Admitting: *Deleted

## 2019-03-19 DIAGNOSIS — Z20822 Contact with and (suspected) exposure to covid-19: Secondary | ICD-10-CM

## 2019-03-21 ENCOUNTER — Other Ambulatory Visit: Payer: Self-pay

## 2019-03-21 ENCOUNTER — Ambulatory Visit (HOSPITAL_COMMUNITY): Payer: Medicare Other | Attending: Cardiology

## 2019-03-21 ENCOUNTER — Ambulatory Visit: Payer: Medicare Other | Admitting: Gynecologic Oncology

## 2019-03-21 DIAGNOSIS — I340 Nonrheumatic mitral (valve) insufficiency: Secondary | ICD-10-CM | POA: Insufficient documentation

## 2019-03-24 ENCOUNTER — Encounter: Payer: Self-pay | Admitting: Internal Medicine

## 2019-03-24 LAB — NOVEL CORONAVIRUS, NAA: SARS-CoV-2, NAA: NOT DETECTED

## 2019-03-26 ENCOUNTER — Ambulatory Visit (INDEPENDENT_AMBULATORY_CARE_PROVIDER_SITE_OTHER): Payer: Medicare Other | Admitting: *Deleted

## 2019-03-26 ENCOUNTER — Other Ambulatory Visit: Payer: Self-pay

## 2019-03-26 DIAGNOSIS — Z23 Encounter for immunization: Secondary | ICD-10-CM | POA: Diagnosis not present

## 2019-03-27 IMAGING — CR DG CHEST 2V
2 series · 2 of 2 positions shown · non-contrast
Comparison: 02/19/2018

CLINICAL DATA: Generalized chest pain. Catheterization 10 days ago.
Today chest tightness radiating to the neck. Diaphoresis. Quit
smoking 7225. History of hypertension and CHF.

EXAM:
CHEST - 2 VIEW

[chest lat]
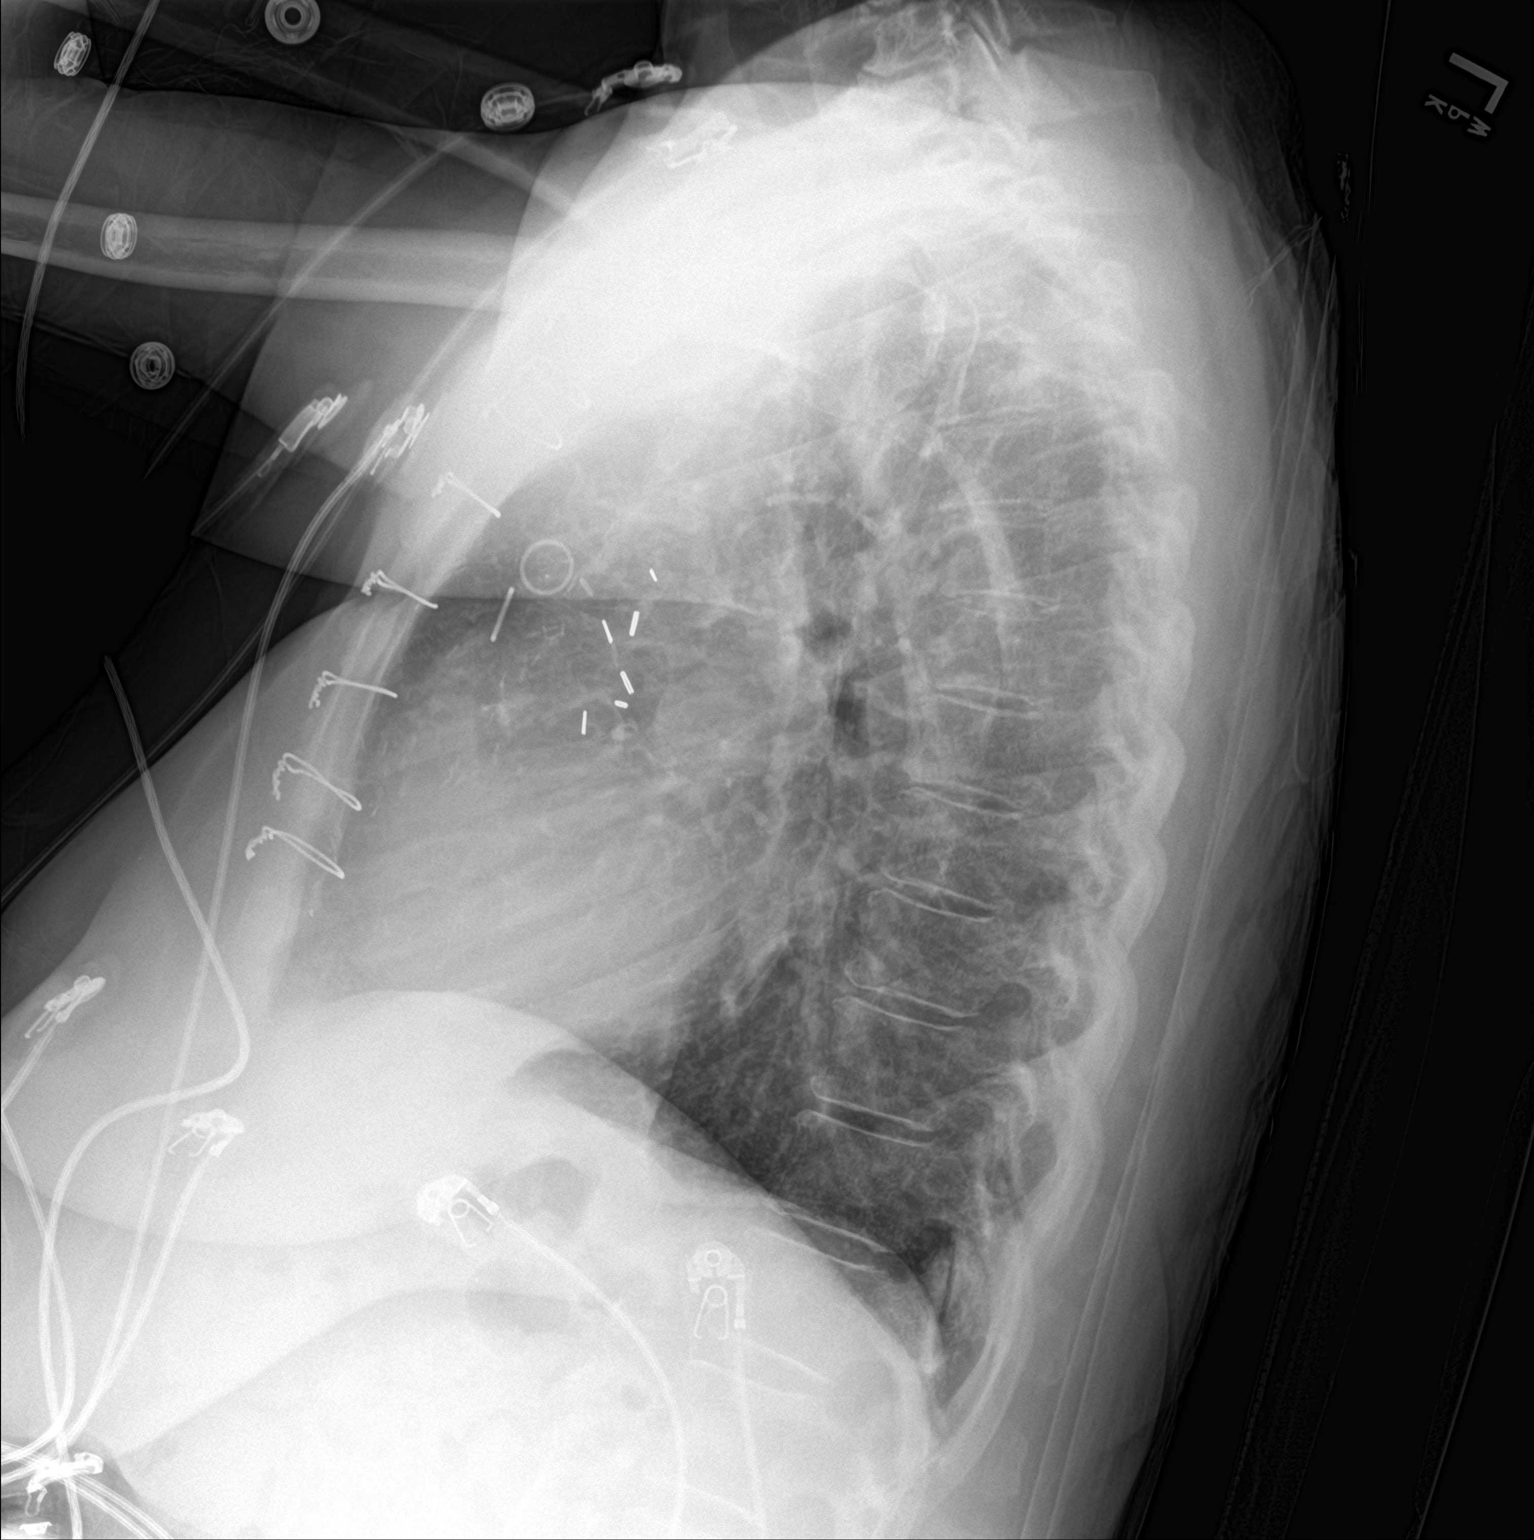

[chest ap]
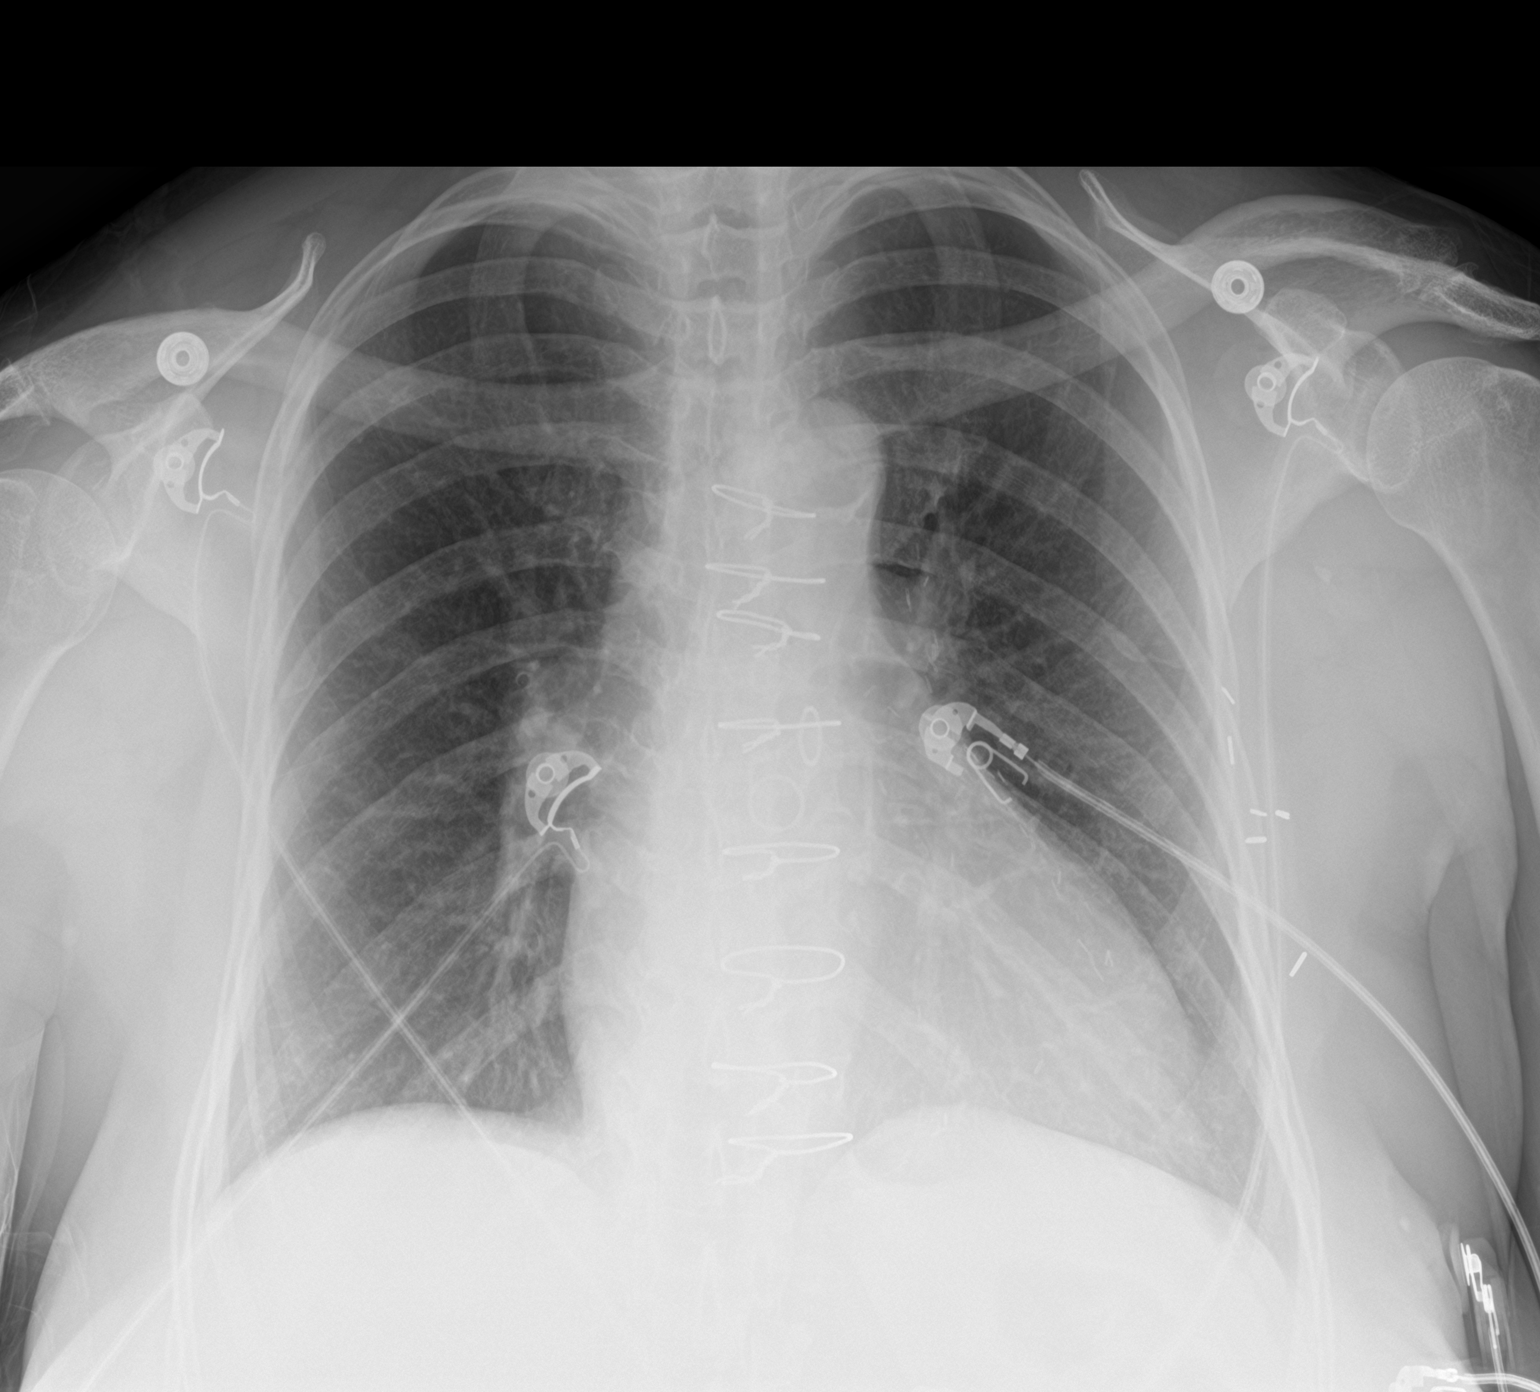

[2 of 2 positions shown; findings below may reference images not displayed]

FINDINGS: Postoperative changes in the mediastinum. Surgical clips in the left
axilla. Mild cardiac enlargement. No vascular congestion, edema, or
consolidation. No blunting of costophrenic angles. No pneumothorax.
Mediastinal contours appear intact. Calcification of the aorta.
IMPRESSION: Mild cardiac enlargement.  No evidence of active pulmonary disease.

## 2019-04-04 ENCOUNTER — Other Ambulatory Visit: Payer: Self-pay

## 2019-04-04 ENCOUNTER — Encounter: Payer: Self-pay | Admitting: Gynecologic Oncology

## 2019-04-04 ENCOUNTER — Inpatient Hospital Stay: Payer: Medicare Other | Attending: Oncology | Admitting: Gynecologic Oncology

## 2019-04-04 VITALS — BP 106/68 | HR 73 | Temp 98.5°F | Resp 18 | Ht 65.0 in | Wt 152.2 lb

## 2019-04-04 DIAGNOSIS — Z1509 Genetic susceptibility to other malignant neoplasm: Secondary | ICD-10-CM

## 2019-04-04 DIAGNOSIS — Z90722 Acquired absence of ovaries, bilateral: Secondary | ICD-10-CM | POA: Insufficient documentation

## 2019-04-04 DIAGNOSIS — Z1504 Genetic susceptibility to malignant neoplasm of endometrium: Secondary | ICD-10-CM | POA: Insufficient documentation

## 2019-04-04 DIAGNOSIS — Z1502 Genetic susceptibility to malignant neoplasm of ovary: Secondary | ICD-10-CM | POA: Insufficient documentation

## 2019-04-04 DIAGNOSIS — Z1501 Genetic susceptibility to malignant neoplasm of breast: Secondary | ICD-10-CM

## 2019-04-04 DIAGNOSIS — Z90710 Acquired absence of both cervix and uterus: Secondary | ICD-10-CM | POA: Diagnosis not present

## 2019-04-04 DIAGNOSIS — D0582 Other specified type of carcinoma in situ of left breast: Secondary | ICD-10-CM | POA: Insufficient documentation

## 2019-04-04 DIAGNOSIS — Z148 Genetic carrier of other disease: Secondary | ICD-10-CM | POA: Diagnosis not present

## 2019-04-04 DIAGNOSIS — Z9013 Acquired absence of bilateral breasts and nipples: Secondary | ICD-10-CM

## 2019-04-04 MED ORDER — SPIRONOLACTONE 25 MG PO TABS: 25.0000 mg | ORAL_TABLET | Freq: Every day | ORAL | 3 refills | Status: DC

## 2019-04-04 NOTE — Progress Notes (Signed)
Postoperative Follow-up Note: Gyn-Onc  Consult was requested by Dr. Maginat for the evaluation of Brenda [name] 63 y.o. female  CC:  Chief Complaint  Patient presents with  . PALB-2 gene mutation    postop evaluation.     Assessment/Plan:  Ms. Rossie L Aube  is a 63 y.o.  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 S/p robotic assisted BSO on 03/13/19.  She has done well postoperatively with no ongoing issues. She will continue follow-up with Dr Magrinat.   HPI: Ms. Shea Burack is a 63-year-old P1 who was seen in consultation at the request of Dr. 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  The patient's cancer history began in the 1990s when she was first diagnosed with premenopausal breast cancer.  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  Given her history of dual breast cancer she underwent genetic testing on October 08, 2018 which revealed a pathologic variant identified in PAL B2.  The patient's medical history is otherwise significant for peripheral vascular disease (she is undergone bypass surgery with the lower extremities.  She is also had a history of coronary artery bypass graft in 2001, most recently in June 2019 she underwent a left heart catheterization with grafting angiography.  She has known carotid disease and has had TIA in the past.  She has a history of congestive heart disease (is unclear if this is secondary to coronary artery disease, or Adriamycin use, or both).  Her surgical history is significant for abdominal hysterectomy in the 1990s for endometriosis.  She reports that her surgeon, Dr. Ross, told her that her endometriosis was behind her uterus.  Her ovaries remained after the surgery.  She is had one prior vaginal delivery. She takes both Plavix and  aspirin as antiplatelet therapy for her vascular disease.  Interval Hx:  On 03/13/19 she underwent a robotic assisted BSO for risk reduction.  Final pathology revealed benign ovaries bilaterally.   Current Meds:  Outpatient Encounter Medications as of 04/04/2019  Medication Sig  . amLODipine (NORVASC) 10 MG tablet Take 1 tablet (10 mg total) by mouth daily.  . aspirin 81 MG tablet Take 1 tablet (81 mg total) by mouth daily. With Food  . Carboxymethylcellulose Sodium (REFRESH PLUS OP) Place 1-2 drops into both eyes as needed (for dryness or irritation only when wearing contacts).  . Cholecalciferol (VITAMIN D-3) 1000 units CAPS Take 2,000 Units by mouth daily.  . clopidogrel (PLAVIX) 75 MG tablet Take 75 mg by mouth daily.  . hyoscyamine (LEVSIN) 0.125 MG tablet Take 1 tablet (0.125 mg total) by mouth every 4 (four) hours as needed. (Patient taking differently: Take 0.125 mg by mouth every 4 (four) hours as needed for bladder spasms or cramping. )  . ibuprofen (ADVIL) 600 MG tablet Take 1 tablet (600 mg total) by mouth every 6 (six) hours as needed. For AFTER surgery (Patient not taking: Reported on 02/24/2019)  . isosorbide mononitrate (IMDUR) 60 MG 24 hr tablet Take 1 tablet (60 mg total) by mouth daily.  . lisinopril-hydrochlorothiazide (PRINZIDE,ZESTORETIC) 20-25 MG tablet Take 1 tablet by mouth daily.  . metoprolol succinate (TOPROL-XL) 100 MG 24 hr tablet Take 1 tablet (100 mg total) by mouth daily. Take with or immediately following a meal.  . montelukast (SINGULAIR) 10 MG tablet Take 1 tablet (  10 mg total) by mouth at bedtime.  . Multiple Vitamins-Calcium (ONE-A-DAY WOMENS PO) Take 1 tablet by mouth daily.  . nitroGLYCERIN (NITROSTAT) 0.4 MG SL tablet Place 1 tablet (0.4 mg total) under the tongue every 5 (five) minutes as needed for chest pain. 1 tablet under tongue every 5 minutes as needed for chest discomfort (max 2 Tab/day)  . Potassium Chloride ER 20 MEQ TBCR Take 1 tablet daily on  Monday Wednesdays Fridays and Sundays  . rosuvastatin (CRESTOR) 20 MG tablet Take 20 mg by mouth daily.  . senna-docusate (SENOKOT-S) 8.6-50 MG tablet Take 2 tablets by mouth at bedtime. For AFTER surgery, do not take if having diarrhea  . spironolactone (ALDACTONE) 25 MG tablet Take 1 tablet (25 mg total) by mouth daily.  . traMADol (ULTRAM) 50 MG tablet Take 1 tablet (50 mg total) by mouth every 6 (six) hours as needed. For AFTER surgery, do not take and drive (Patient not taking: Reported on 02/24/2019)  . venlafaxine XR (EFFEXOR-XR) 37.5 MG 24 hr capsule TAKE 1 CAPSULE BY MOUTH ONCE DAILY WITH BREAKFAST (Patient taking differently: Take 37.5 mg by mouth daily with breakfast. )   No facility-administered encounter medications on file as of 04/04/2019.     Allergy:  Allergies  Allergen Reactions  . Adhesive [Tape] Itching  . Latex Rash    Social Hx:   Social History   Socioeconomic History  . Marital status: Single    Spouse name: Not on file  . Number of children: 1  . Years of education: Not on file  . Highest education level: Not on file  Occupational History  . Occupation: Social worker, retired from [employer]    Employer: UNEMPLOYED  Social Needs  . Financial resource strain: Not on file  . Food insecurity    Worry: Not on file    Inability: Not on file  . Transportation needs    Medical: Not on file    Non-medical: Not on file  Tobacco Use  . Smoking status: Former Smoker    Types: Cigarettes    Quit date: 09/11/2001    Years since quitting: 17.5  . Smokeless tobacco: Never Used  Substance and Sexual Activity  . Alcohol use: No  . Drug use: No  . Sexual activity: Not on file  Lifestyle  . Physical activity    Days per week: Not on file    Minutes per session: Not on file  . Stress: Not on file  Relationships  . Social connections    Talks on phone: Not on file    Gets together: Not on file    Attends religious service: Not on file    Active member of club or  organization: Not on file    Attends meetings of clubs or organizations: Not on file    Relationship status: Not on file  . Intimate partner violence    Fear of current or ex partner: Not on file    Emotionally abused: Not on file    Physically abused: Not on file    Forced sexual activity: Not on file  Other Topics Concern  . Not on file  Social History Narrative  . Not on file    Past Surgical Hx:  Past Surgical History:  Procedure Laterality Date  . ABDOMINAL AORTOGRAM W/LOWER EXTREMITY N/A 06/13/2017   Procedure: ABDOMINAL AORTOGRAM W/LOWER EXTREMITY;  Surgeon: Cain, Brandon Christopher, MD;  Location: [facility];  Service: Cardiovascular;  Laterality: N/A;  Bilateral  .   ABDOMINAL HYSTERECTOMY  1998  . BREAST LUMPECTOMY WITH AXILLARY LYMPH NODE DISSECTION Left 1999  . CARDIAC CATHETERIZATION  04-24-2000  dr Caryl Comes   3V CAD , subtotal PDA w/ slow flow colleterals , ef 48%  . CARDIAC CATHETERIZATION  06/11/2000   3V CAD with progression of LAD disease  . CAROTID ENDARTERECTOMY Left 04-27-2000   dr Amedeo Plenty  _0   . CORONARY ARTERY BYPASS GRAFT  06-12-2000   dr Lucianne Lei tright _1    x 5 (left internal mammary artery to left anterior  descending coronary artery  . LEFT HEART CATH AND CORS/GRAFTS ANGIOGRAPHY N/A 02/21/2018   Procedure: LEFT HEART CATH AND CORS/GRAFTS ANGIOGRAPHY;  Surgeon: Lorretta Harp, MD;  Location: [place] [facility];  Service: Cardiovascular;  Laterality: N/A;  . PERIPHERAL VASCULAR INTERVENTION  06/13/2017   Procedure: PERIPHERAL VASCULAR INTERVENTION;  Surgeon: Waynetta Sandy, MD;  Location: [place] [facility];  Service: Cardiovascular;;  Bilateral Iliac Stents  . ROBOTIC ASSISTED SALPINGO OOPHERECTOMY Bilateral 03/13/2019   Procedure: XI ROBOTIC ASSISTED BILATERAL  SALPINGO OOPHORECTOMY;  Surgeon: Everitt Amber, MD;  Location: [facility];  Service: Gynecology;  Laterality: Bilateral;  . TOTAL MASTECTOMY Bilateral 11/07/2018   Procedure:  BILATERAL MASTECTOMIES;  Surgeon: Stark Klein, MD;  Location: [place];  Service: General;  Laterality: Bilateral;    Past Medical Hx:  Past Medical History:  Diagnosis Date  . Anxiety   . Breast cancer, left 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 Carotid artery stenosis    bilateral---  04-27-2000  s/p  left carotid endarterectomy:  last doppler in epic 08-13-2018  right ICA 40-59%,  right ECA >50%,  left ICA 1-39% ,  bilateral subclavians stenotic  . CHF (congestive heart failure) ([place])   . Chronic stable angina ([place])    followed by cardiology  . Complication of anesthesia   . Coronary artery disease cardiologist-- dr Stanford Breed   06-12-2000  s/p  CABG x5  :  cardiac cath 02-21-2018 --  normal LVF, 80% LM, occluded RCA, 95% Ramus and 100% OM,  Patent seqSVG to  PLA-PDA & LIMA to LAD,  seqSVG to OM and RI occluded  . Depression   . Family history of lung cancer   . History of CVA with residual deficit    short term memory loss per pt  . History of Helicobacter pylori infection 2001  . Hyperlipidemia   . Hypertension   . Hyperthyroidism    dx yrs ago, per pt have taken medication for awhile  . Mild mitral regurgitation    per echo 06/ 2019  . Peripheral vascular disease ([place])    managed by vascular-- dr Donzetta Matters  . PONV (postoperative nausea and vomiting)   . S/P CABG x 5 06-12-2000   by dr Lucianne Lei tright _2     Past Gynecological History:  SVD x 1 No LMP recorded. Patient has had a hysterectomy.  Family Hx:  Family History  Problem Relation Age of Onset  . Lung cancer Mother 74       died at an early age  . Other Other        There is no Hx of premature coronary artery disease in her family    Review of Systems:  Constitutional  Feels  well,     ENT Normal appearing ears and nares bilaterally Skin/Breast  No rash, sores, jaundice, itching, dryness Cardiovascular  No chest pain, shortness of breath, or edema  Pulmonary  No cough or wheeze.  Gastro Intestinal  No nausea, vomitting, or diarrhoea. No bright red blood per rectum, no abdominal pain, change in bowel movement, or constipation.  Genito Urinary  No frequency, urgency, dysuria, no bleeding  Musculo Skeletal  No myalgia, arthralgia, joint swelling or pain  Neurologic  No weakness, numbness, change in gait,  Psychology  No depression, anxiety, insomnia.   Vitals:  There were no vitals taken for this visit.  Physical Exam: WD in NAD Neck  Supple NROM, without any enlargements.  Lymph Node Survey No cervical supraclavicular or inguinal adenopathy Cardiovascular  Pulse normal rate, regularity and rhythm. S1 and S2 normal.  Lungs  Clear to auscultation bilateraly, without wheezes/crackles/rhonchi. Good air movement.  Skin  No rash/lesions/breakdown  Psychiatry  Alert and oriented to person, place, and time  Abdomen  Normoactive bowel sounds, abdomen soft, non-tender and thin without evidence of hernia. Incisions well healed.  Back No CVA tenderness Genito Urinary  deferred Rectal  deferred Extremities  No bilateral cyanosis, clubbing or edema. + scar from CABG graft   Thereasa Solo, MD  04/04/2019, 2:52 PM

## 2019-04-04 NOTE — Patient Instructions (Addendum)
Dr Denman George performed removal of both tubes and ovaries (called bilateral salpingo-oophorectomy). The pathology testing of the ovaries and peritoneal washings was benign (no abnormalities). You are cleared to resume all activities.   Please call Dr Serita Grit office if you have questions related to her surgery at [phone]. Dr Jana Hakim will continue to see you for follow-up as scheduled.

## 2019-04-21 ENCOUNTER — Encounter: Payer: Self-pay | Admitting: Gynecologic Oncology

## 2019-04-21 ENCOUNTER — Other Ambulatory Visit: Payer: Medicare Other

## 2019-04-21 ENCOUNTER — Encounter: Payer: Self-pay | Admitting: Internal Medicine

## 2019-04-21 DIAGNOSIS — R197 Diarrhea, unspecified: Secondary | ICD-10-CM

## 2019-04-23 ENCOUNTER — Encounter: Payer: Self-pay | Admitting: Internal Medicine

## 2019-04-23 LAB — GASTROINTESTINAL PATHOGEN PANEL PCR
C. difficile Tox A/B, PCR: NOT DETECTED
Campylobacter, PCR: NOT DETECTED
Cryptosporidium, PCR: NOT DETECTED
E coli (ETEC) LT/ST PCR: NOT DETECTED
E coli (STEC) stx1/stx2, PCR: NOT DETECTED
E coli 0157, PCR: NOT DETECTED
Giardia lamblia, PCR: NOT DETECTED
Norovirus, PCR: NOT DETECTED
Rotavirus A, PCR: NOT DETECTED
Salmonella, PCR: NOT DETECTED
Shigella, PCR: NOT DETECTED

## 2019-04-24 ENCOUNTER — Encounter: Payer: Self-pay | Admitting: Internal Medicine

## 2019-04-24 DIAGNOSIS — R197 Diarrhea, unspecified: Secondary | ICD-10-CM

## 2019-04-25 ENCOUNTER — Encounter: Payer: Self-pay | Admitting: Internal Medicine

## 2019-05-01 ENCOUNTER — Encounter: Payer: Self-pay | Admitting: Emergency Medicine

## 2019-05-01 ENCOUNTER — Other Ambulatory Visit: Payer: Self-pay

## 2019-05-01 ENCOUNTER — Ambulatory Visit
Admission: EM | Admit: 2019-05-01 | Discharge: 2019-05-01 | Disposition: A | Discharge status: Home / Self Care | Payer: Medicare Other | Attending: Emergency Medicine | Admitting: Emergency Medicine

## 2019-05-01 DIAGNOSIS — R1084 Generalized abdominal pain: Secondary | ICD-10-CM | POA: Diagnosis not present

## 2019-05-01 DIAGNOSIS — F4321 Adjustment disorder with depressed mood: Secondary | ICD-10-CM

## 2019-05-01 DIAGNOSIS — F418 Other specified anxiety disorders: Secondary | ICD-10-CM | POA: Diagnosis not present

## 2019-05-01 MED ORDER — PANTOPRAZOLE SODIUM 40 MG PO TBEC: 40.0000 mg | DELAYED_RELEASE_TABLET | Freq: Every day | ORAL | 0 refills | Status: DC

## 2019-05-01 MED ORDER — HYDROXYZINE HCL 25 MG PO TABS: 25.0000 mg | ORAL_TABLET | Freq: Every evening | ORAL | 0 refills | Status: DC | PRN

## 2019-05-01 NOTE — ED Triage Notes (Signed)
Pt presents to [facility] fro assessment of 2 weeks of abdominal cramping, back pain, epigastric pain, excessive gas, diarrhea then switching to constipation.  Patient states shes taking hycosamine, and it has been helping some.  Also tried ibuprofen and tramadol for pain.  Patient states no matter what she eats she has pain approx 1 hour after.   Patient states is includes passing gas and lots of belching.  The episode lasts until bed time unless she takes something for pain.  Heating pad does seem to help.  Patient has a GI appointment for Sept 1st, but states she couldn't wait that long.

## 2019-05-01 NOTE — Discharge Instructions (Addendum)
Take Protonix daily. Keep symptom log: what you eat, any GI symptoms you experience that day, and how you are feeling mentally/emotionally. Keep GI appointment on 9/1.  Take the hydroxyzine as needed before bedtime for anxiety/sleep: Do not drink or drive will take this medication.   Go to ER for further evaluation for severe pain, fever, blood in stool.

## 2019-05-01 NOTE — ED Provider Notes (Signed)
[facility]    CSN: [id number] Arrival date & time: 05/01/19  1317      History   Chief Complaint Chief Complaint  Patient presents with  . Abdominal Pain    HPI Brenda [name] is a 63 y.o. female with history of left breast cancer status post bilateral mastectomy, CHF, hypertension, and pancreatic lesion presenting for generalized abdominal discomfort, bloating, increased gas.  Patient states this is been ongoing "for a long time "but is been worse last 2 weeks.  Patient has been seen by primary care for this issue as well: Negative stool culture on 8/10, negative COVID 7/8.  Patient has tried walking with a girlfriend 4-5 times weekly, high Cosamin, tramadol, ibuprofen, prune juice, Gas-X, probiotics without significant relief.  Patient has not been keeping a food journal.  States that there was 1 day where she is completely asymptomatic, though that was "a while back and I do not know what I did ".  Patient thought initially it was due to spicy foods, though now it seems like no matter what she does she experience some of her symptoms of urgency symptoms appear to be consistent) 30 to 60 minutes after eating.  Patient denies regurgitation, nausea, vomiting, diarrhea, blood in stool.  Patient has had numerous abdominal surgeries: Underwent salpingo-oophorectomy in July that went well.  Patient endorsing chronic history of irregular bowel movements, though denies change to her baseline.  Patient denies urinary symptoms such as frequency, urgency, dysuria.  Patient has an appointment with GI on 9/1, intends to keep this. Of note, patient is anxious about her overall health status, seems fearful that there may be underlying malignant etiology.  States that a friend of hers just recently passed from cervical cancer and she "watched her die".  Patient denies SI/HI.  Has been having some sleep disruption second to anxiety, has not tried anything for this.  Past Medical History:   Diagnosis Date  . Anxiety   . 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 Carotid artery stenosis    bilateral---  04-27-2000  s/p  left carotid endarterectomy:  last doppler in epic 08-13-2018  right ICA 40-59%,  right ECA >50%,  left ICA 1-39% ,  bilateral subclavians stenotic  . CHF (congestive heart failure) ([place])   . Chronic stable angina ([place])    followed by cardiology  . Complication of anesthesia   . Coronary artery disease cardiologist-- dr Stanford Breed   06-12-2000  s/p  CABG x5  :  cardiac cath 02-21-2018 --  normal LVF, 80% LM, occluded RCA, 95% Ramus and 100% OM,  Patent seqSVG to  PLA-PDA & LIMA to LAD,  seqSVG to OM and RI occluded  . Depression   . Family history of lung cancer   . History of CVA with residual deficit    short term memory loss per pt  . History of Helicobacter pylori infection 2001  . Hyperlipidemia   . Hypertension   . Hyperthyroidism    dx yrs ago, per pt have taken medication for awhile  . Mild mitral regurgitation    per echo 06/ 2019  . Peripheral vascular disease ([place])    managed by vascular-- dr Donzetta Matters  . PONV (postoperative nausea and vomiting)   .  S/P CABG x 5 06-12-2000   by dr Nils Pyle @[facility]     Patient Active Problem List   Diagnosis Date Noted  . Abdominal pain 11/21/2018  . Breast cancer, stage 0, left 11/07/2018  . Monoallelic mutation of PALB2 gene 10/25/2018  . Genetic testing 10/25/2018  . Malignant neoplasm of central portion of left breast in female, estrogen receptor negative ([place]) 10/08/2018  . 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 Family history of lung cancer   . Loss of transverse plantar arch of left foot 07/19/2018  . Diarrhea 04/25/2018  . Hypothyroidism 03/02/2018  . Elevated  troponin   . Chest pain 02/20/2018  . Hypertensive emergency 02/19/2018  . Lesion of pancreas 06/22/2017  . Constipation 06/22/2017  . Peripheral vascular disease ([place]) 06/01/2017  . Left leg pain 05/10/2017  . Allergic rhinitis 02/25/2016  . Low back pain 09/29/2015  . Carpal tunnel syndrome, right 08/20/2015  . Hx of completed stroke 05/21/2015  . Memory impairment 05/21/2015  . Hyperthyroidism 12/29/2008  . HYPERCHOLESTEROLEMIA 12/29/2008  . Anxiety 12/29/2008  . Essential hypertension 12/29/2008  . Coronary atherosclerosis 12/29/2008  . CAROTID ARTERY STENOSIS 12/29/2008    Past Surgical History:  Procedure Laterality Date  . ABDOMINAL AORTOGRAM W/LOWER EXTREMITY N/A 06/13/2017   Procedure: ABDOMINAL AORTOGRAM W/LOWER EXTREMITY;  Surgeon: Waynetta Sandy, MD;  Location: [place] [facility];  Service: Cardiovascular;  Laterality: N/A;  Bilateral  . ABDOMINAL HYSTERECTOMY  1998  . BREAST LUMPECTOMY WITH AXILLARY LYMPH NODE DISSECTION Left 1999  . CARDIAC CATHETERIZATION  04-24-2000  dr Caryl Comes   3V CAD , subtotal PDA w/ slow flow colleterals , ef 48%  . CARDIAC CATHETERIZATION  06/11/2000   3V CAD with progression of LAD disease  . CAROTID ENDARTERECTOMY Left 04-27-2000   dr Amedeo Plenty  @[facility]   . CORONARY ARTERY BYPASS GRAFT  06-12-2000   dr Lucianne Lei tright @[facility]    x 5 (left internal mammary artery to left anterior  descending coronary artery  . LEFT HEART CATH AND CORS/GRAFTS ANGIOGRAPHY N/A 02/21/2018   Procedure: LEFT HEART CATH AND CORS/GRAFTS ANGIOGRAPHY;  Surgeon: Lorretta Harp, MD;  Location: [place];  Service: Cardiovascular;  Laterality: N/A;  . PERIPHERAL VASCULAR INTERVENTION  06/13/2017   Procedure: PERIPHERAL VASCULAR INTERVENTION;  Surgeon: Waynetta Sandy, MD;  Location: [facility] [place] [facility];  Service: Cardiovascular;;  Bilateral Iliac Stents  . ROBOTIC ASSISTED SALPINGO OOPHERECTOMY Bilateral 03/13/2019   Procedure: XI ROBOTIC ASSISTED BILATERAL   SALPINGO OOPHORECTOMY;  Surgeon: Everitt Amber, MD;  Location: [facility];  Service: Gynecology;  Laterality: Bilateral;  . TOTAL MASTECTOMY Bilateral 11/07/2018   Procedure: BILATERAL MASTECTOMIES;  Surgeon: Stark Klein, MD;  Location: [place];  Service: General;  Laterality: Bilateral;    OB History   No obstetric history on file.      Home Medications    Prior to Admission medications   Medication Sig Start Date End Date Taking? Authorizing Provider  amLODipine (NORVASC) 10 MG tablet Take 1 tablet (10 mg total) by mouth daily. 11/04/18   Lelon Perla, MD  aspirin 81 MG tablet Take 1 tablet (81 mg total) by mouth daily. With Food 03/02/18   Roxan Hockey, MD  Carboxymethylcellulose Sodium (REFRESH PLUS OP) Place 1-2 drops into both eyes as needed (for dryness or irritation only when wearing contacts).    [provider]  Cholecalciferol (VITAMIN D-3) 1000 units CAPS Take 2,000 Units by mouth daily.  [provider]  clopidogrel (PLAVIX) 75 MG tablet Take 75 mg by mouth daily.    [provider]  hydrOXYzine (ATARAX/VISTARIL) 25 MG tablet Take 1 tablet (25 mg total) by mouth at bedtime as needed. 05/01/19   Hall-Potvin, [place], PA-C  hyoscyamine (LEVSIN) 0.125 MG tablet Take 1 tablet (0.125 mg total) by mouth every 4 (four) hours as needed. Patient taking differently: Take 0.125 mg by mouth every 4 (four) hours as needed for bladder spasms or cramping.  02/12/19   [name], MD  ibuprofen (ADVIL) 600 MG tablet Take 1 tablet (600 mg total) by mouth every 6 (six) hours as needed. For AFTER surgery Patient not taking: Reported on 02/24/2019 02/05/19   Joylene John D, NP  isosorbide mononitrate (IMDUR) 60 MG 24 hr tablet Take 1 tablet (60 mg total) by mouth daily. 02/24/19 02/24/20  Kroeger, Lorelee Cover., PA-C  lisinopril-hydrochlorothiazide (PRINZIDE,ZESTORETIC) 20-25 MG tablet Take 1 tablet by mouth daily. 06/26/18    Lelon Perla, MD  metoprolol succinate (TOPROL-XL) 100 MG 24 hr tablet Take 1 tablet (100 mg total) by mouth daily. Take with or immediately following a meal. 06/13/18   Crenshaw, Denice Bors, MD  montelukast (SINGULAIR) 10 MG tablet Take 1 tablet (10 mg total) by mouth at bedtime. 07/30/18   [name], MD  Multiple Vitamins-Calcium (ONE-A-DAY WOMENS PO) Take 1 tablet by mouth daily.    [provider]  nitroGLYCERIN (NITROSTAT) 0.4 MG SL tablet Place 1 tablet (0.4 mg total) under the tongue every 5 (five) minutes as needed for chest pain. 1 tablet under tongue every 5 minutes as needed for chest discomfort (max 2 Tab/day) 02/24/19   Kroeger, Lorelee Cover., PA-C  pantoprazole (PROTONIX) 40 MG tablet Take 1 tablet (40 mg total) by mouth daily. 05/01/19   Hall-Potvin, [place], [place]  Potassium Chloride ER 20 MEQ TBCR Take 1 tablet daily on Monday Wednesdays Fridays and Sundays 04/25/18   [name], MD  rosuvastatin (CRESTOR) 20 MG tablet Take 20 mg by mouth daily. 02/24/18   [provider]  senna-docusate (SENOKOT-S) 8.6-50 MG tablet Take 2 tablets by mouth at bedtime. For AFTER surgery, do not take if having diarrhea 02/05/19   Joylene John D, NP  spironolactone (ALDACTONE) 25 MG tablet Take 1 tablet (25 mg total) by mouth daily. 04/04/19 07/03/19  Lelon Perla, MD  traMADol (ULTRAM) 50 MG tablet Take 1 tablet (50 mg total) by mouth every 6 (six) hours as needed. For AFTER surgery, do not take and drive Patient not taking: Reported on 02/24/2019 02/05/19   Joylene John D, NP  venlafaxine XR (EFFEXOR-XR) 37.5 MG 24 hr capsule TAKE 1 CAPSULE BY MOUTH ONCE DAILY WITH BREAKFAST Patient taking differently: Take 37.5 mg by mouth daily with breakfast.  04/25/18   Lyndal Pulley, DO    Family History Family History  Problem Relation Age of Onset  . Lung cancer Mother 23       died at an early age  . Other Other        There is no Hx of premature coronary artery  disease in her family    Social History Social History   Tobacco Use  . Smoking status: Former Smoker    Types: Cigarettes    Quit date: 09/11/2001    Years since quitting: 17.6  . Smokeless tobacco: Never Used  Substance Use Topics  . Alcohol use: No  . Drug use: No     Allergies  Adhesive [tape] and Latex   Review of Systems Review of Systems  Constitutional: Negative for activity change, appetite change, fatigue and fever.  HENT: Negative for ear pain, sinus pain, sore throat and voice change.   Eyes: Negative for pain, redness and visual disturbance.  Respiratory: Negative for cough, shortness of breath and wheezing.   Cardiovascular: Negative for chest pain and palpitations.  Gastrointestinal: Positive for abdominal pain. Negative for abdominal distention, anal bleeding, blood in stool, constipation, diarrhea, nausea, rectal pain and vomiting.  Genitourinary: Negative for dysuria, frequency, hematuria, pelvic pain, urgency, vaginal bleeding and vaginal discharge.  Musculoskeletal: Negative for arthralgias and myalgias.  Skin: Negative for rash and wound.  Neurological: Negative for syncope and headaches.     Physical Exam Triage Vital Signs ED Triage Vitals [05/01/19 1331]  Enc Vitals Group     BP 104/63     Pulse Rate 68     Resp 18     Temp 98.3 F (36.8 C)     Temp Source Oral     SpO2 96 %     Weight      Height      Head Circumference      Peak Flow      Pain Score 8     Pain Loc      Pain Edu?      Excl. in [place]?    No data found.  Updated Vital Signs BP 104/63 (BP Location: Right Arm)   Pulse 68   Temp 98.3 F (36.8 C) (Oral)   Resp 18   SpO2 96%   Visual Acuity Right Eye Distance:   Left Eye Distance:   Bilateral Distance:    Right Eye Near:   Left Eye Near:    Bilateral Near:     Physical Exam Constitutional:      General: She is not in acute distress. HENT:     Head: Normocephalic and atraumatic.  Eyes:     General: No  scleral icterus.    Pupils: Pupils are equal, round, and reactive to light.  Cardiovascular:     Rate and Rhythm: Normal rate.  Pulmonary:     Effort: Pulmonary effort is normal.  Abdominal:     General: Bowel sounds are normal. There is no distension or abdominal bruit.     Palpations: Abdomen is soft. There is no hepatomegaly, splenomegaly, mass or pulsatile mass.     Tenderness: There is generalized abdominal tenderness. There is no right CVA tenderness, left CVA tenderness, guarding or rebound. Negative signs include Murphy's sign, Rovsing's sign and McBurney's sign.     Hernia: No hernia is present.  Skin:    Coloration: Skin is not jaundiced or pale.  Neurological:     Mental Status: She is alert and oriented to person, place, and time.      UC Treatments / Results  Labs (all labs ordered are listed, but only abnormal results are displayed) Labs Reviewed - No data to display  EKG   Radiology No results found.  Procedures Procedures (including critical care time)  Medications Ordered in UC Medications - No data to display  Initial Impression / Assessment and Plan / UC Course  I have reviewed the triage vital signs and the nursing notes.  Pertinent labs & imaging results that were available during my care of the patient were reviewed by me and considered in my medical decision making (see chart for details).     1.  Generalized abdominal pain  No focal findings, signs/symptoms for occult bleed.  Given patient's extensive work-up thus far there is low concern for infectious process.  Patient to trial PPI, keep symptom log as listed below to bring with her GI appointment 9/1.  Return precautions discussed, patient verbalized understanding and is agreeable to plan.  2.  Anxiety about health Patient to keep GI appointment 9/1 for further reassurance.  Was able to reassure patient today, will try hydroxyzine for anxiolytic/sleep disruption in the short-term.  Discussed  that this could her symptoms could also be exacerbated Final Clinical Impressions(s) / UC Diagnoses   Final diagnoses:  Generalized abdominal pain     Discharge Instructions     Take Protonix daily. Keep symptom log: what you eat, any GI symptoms you experience that day, and how you are feeling mentally/emotionally. Keep GI appointment on 9/1.  Take the hydroxyzine as needed before bedtime for anxiety/sleep: Do not drink or drive will take this medication.   Go to ER for further evaluation for severe pain, fever, blood in stool.    ED Prescriptions    Medication Sig Dispense Auth. Provider   hydrOXYzine (ATARAX/VISTARIL) 25 MG tablet Take 1 tablet (25 mg total) by mouth at bedtime as needed. 12 tablet Hall-Potvin, [place], PA-C   pantoprazole (PROTONIX) 40 MG tablet Take 1 tablet (40 mg total) by mouth daily. 30 tablet Hall-Potvin, [place], PA-C     Controlled Substance Prescriptions [place] Controlled Substance Registry consulted? Not Applicable   Quincy Sheehan, [place] 05/01/19 1706

## 2019-05-02 ENCOUNTER — Ambulatory Visit (INDEPENDENT_AMBULATORY_CARE_PROVIDER_SITE_OTHER): Payer: Medicare Other | Admitting: Gastroenterology

## 2019-05-02 ENCOUNTER — Other Ambulatory Visit (INDEPENDENT_AMBULATORY_CARE_PROVIDER_SITE_OTHER): Payer: Medicare Other

## 2019-05-02 ENCOUNTER — Encounter: Payer: Self-pay | Admitting: Gastroenterology

## 2019-05-02 ENCOUNTER — Telehealth: Payer: Self-pay | Admitting: Physician Assistant

## 2019-05-02 VITALS — BP 100/64 | HR 64 | Temp 98.5°F | Ht 63.75 in | Wt 144.2 lb

## 2019-05-02 DIAGNOSIS — R1084 Generalized abdominal pain: Secondary | ICD-10-CM

## 2019-05-02 DIAGNOSIS — R140 Abdominal distension (gaseous): Secondary | ICD-10-CM

## 2019-05-02 LAB — BASIC METABOLIC PANEL
BUN: 32 mg/dL — ABNORMAL HIGH (ref 6–23)
CO2: 22 mEq/L (ref 19–32)
Calcium: 10.3 mg/dL (ref 8.4–10.5)
Chloride: 106 mEq/L (ref 96–112)
Creatinine, Ser: 1.25 mg/dL — ABNORMAL HIGH (ref 0.40–1.20)
GFR: 51.99 mL/min — ABNORMAL LOW (ref >60.00)
Glucose, Bld: 84 mg/dL (ref 70–99)
Potassium: 3.9 mEq/L (ref 3.5–5.1)
Sodium: 138 mEq/L (ref 135–145)

## 2019-05-02 MED ORDER — DICYCLOMINE HCL 10 MG PO CAPS: 10.0000 mg | ORAL_CAPSULE | Freq: Two times a day (BID) | ORAL | 2 refills | Status: DC

## 2019-05-02 NOTE — Telephone Encounter (Signed)
Patient called this morning stating that she was under the impression that she would receive a refill for her hyoscyamine.  Provider who saw patient not on-site at this time.  Spoke with Amy, APP who reviewed chart and states she is able to refill medication for 4 days to give her time to get a refill from her PCP, and patient began crying on the phone and states we did not do anything for her at the visit, and she went to the ER last night and did not stay due to the amount of people there, so she is still in pain, and does not feel like we did anything for her.  This RN attempted to explain the need for patient to be seen at ER for further workup, and patient hung up on this RN.

## 2019-05-02 NOTE — Patient Instructions (Addendum)
Please stop your Imodium.   Today we are giving you samples and coupons of IBgard to try. This is an over the counter product.   Your provider has requested that you go to the basement level for lab work before leaving today. Press "B" on the elevator. The lab is located at the first door on the left as you exit the elevator.  We have sent the following medications to your pharmacy for you to pick up at your convenience: Bentyl   You have been scheduled for a CT scan of the abdomen and pelvis at [facility] ([address]---this is in the same building as Press [facility]).   You are scheduled on 05/14/2019 at 9:15AM. You should arrive 15 minutes prior to your appointment time for registration. Please follow the written instructions below on the day of your exam:  WARNING: IF YOU ARE ALLERGIC TO IODINE/X-RAY DYE, PLEASE NOTIFY RADIOLOGY IMMEDIATELY AT [phone]! YOU WILL BE GIVEN A 13 HOUR PREMEDICATION PREP.  1) Do not eat or drink anything after 5:15AM (4 hours prior to your test) 2) You have been given 2 bottles of oral contrast to drink. The solution may taste better if refrigerated, but do NOT add ice or any other liquid to this solution. Shake well before drinking.    Drink 1 bottle of contrast @ 7:15AM (2 hours prior to your exam)  Drink 1 bottle of contrast @ 8:15AM (1 hour prior to your exam)  You may take any medications as prescribed with a small amount of water, if necessary. If you take any of the following medications: METFORMIN, GLUCOPHAGE, GLUCOVANCE, AVANDAMET, RIOMET, FORTAMET, [place] MET, JANUMET, GLUMETZA or METAGLIP, you MAY be asked to HOLD this medication 48 hours AFTER the exam.  The purpose of you drinking the oral contrast is to aid in the visualization of your intestinal tract. The contrast solution may cause some diarrhea. Depending on your individual set of symptoms, you may also receive an intravenous injection of x-ray contrast/dye. Plan on  being at [facility] for 30 minutes or longer, depending on the type of exam you are having performed.  This test typically takes 30-45 minutes to complete.  If you have any questions regarding your exam or if you need to reschedule, you may call the CT department at [phone] between the hours of 8:00 am and 5:00 pm, Monday-Friday.  ________________________________________________________________________  I appreciate the opportunity to care for you. Alonza Bogus, PA-C

## 2019-05-02 NOTE — Progress Notes (Addendum)
05/02/2019 Brenda [name] [id number] May 24, 1956   HISTORY OF PRESENT ILLNESS: This is a 63 year old female who is new to our office.  She is here today at the request of her PCP, Dr. Sharlet Salina.  She has several GI complaints, but today mostly focuses on issues with generalized abdominal pain and gas/belching/bloating.  Of note, she is very anxious, depressed, tearful at her visit here today.  She admits to being depressed.  Tells me that she "feels defeated".  Recently battling breast cancer and had double mastectomy and hysterectomy.  Says that she lost a friend to cervical cancer recently as well.  Anyway, she talks mostly about a lot of generalized abdominal cramping.  Mostly in lower abdomen.  She also describes a lot of gas in her lower back but then seems to travel up and cause a lot of belching and pain into her upper back as well.  She says that as soon she eats anything the symptoms occur.  She says about 30 to 60 minutes after eating she gets buildup of gas.  She reports that all these issues started in July and prior to that she is not having any bowel issues.  She also reports issues with alternating constipation and diarrhea, but then says she has been taking Imodium and continues taking that despite complaining of constipation.  Today is Friday and she has her last good bowel movement was Monday or Tuesday.  Her story seems very inconsistent and does not make great sense as to why she is still taking the Imodium since now she has not moved her bowels for several days.  No blood in stools.  Has been using Ibuprofen for abdominal pain because she does not want to use "pain medications".  Colonoscopy 09/2010 with Dr. Collene Mares was normal.    Past Medical History:  Diagnosis Date  . Alcoholism ([place])   . Anxiety   . 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 Carotid artery stenosis    bilateral---  04-27-2000  s/p  left carotid endarterectomy:  last doppler in epic 08-13-2018  right ICA 40-59%,  right ECA >50%,  left ICA 1-39% ,  bilateral subclavians stenotic  . CHF (congestive heart failure) ([place])   . Chronic stable angina ([place])    followed by cardiology  . Complication of anesthesia   . Coronary artery disease cardiologist-- dr Stanford Breed   06-12-2000  s/p  CABG x5  :  cardiac cath 02-21-2018 --  normal LVF, 80% LM, occluded RCA, 95% Ramus and 100% OM,  Patent seqSVG to  PLA-PDA & LIMA to LAD,  seqSVG to OM and RI occluded  . Depression   . Family history of lung cancer   . History of CVA with residual deficit    short term memory loss per pt  . History of Helicobacter pylori infection 2001  . Hyperlipidemia   . Hypertension   . Hyperthyroidism    dx yrs ago, per pt have taken medication for awhile  . Mild mitral regurgitation    per echo 06/ 2019  . Peripheral vascular disease ([place])    managed by vascular-- dr Donzetta Matters  . PONV (postoperative nausea and vomiting)   . S/P CABG x 5 06-12-2000   by  dr Lucianne Lei tright @[facility]    Past Surgical History:  Procedure Laterality Date  . ABDOMINAL AORTOGRAM W/LOWER EXTREMITY N/A 06/13/2017   Procedure: ABDOMINAL AORTOGRAM W/LOWER EXTREMITY;  Surgeon: Waynetta Sandy, MD;  Location: [place] [facility];  Service: Cardiovascular;  Laterality: N/A;  Bilateral  . ABDOMINAL HYSTERECTOMY  1998  . BREAST LUMPECTOMY WITH AXILLARY LYMPH NODE DISSECTION Left 1999  . CARDIAC CATHETERIZATION  04-24-2000  dr Caryl Comes   3V CAD , subtotal PDA w/ slow flow colleterals , ef 48%  . CARDIAC CATHETERIZATION  06/11/2000   3V CAD with progression of LAD disease  . CAROTID ENDARTERECTOMY Left 04-27-2000   dr Amedeo Plenty  @[facility]   . CORONARY ARTERY BYPASS GRAFT  06-12-2000   dr Lucianne Lei tright @[facility]    x 5 (left internal mammary artery to left anterior   descending coronary artery  . LEFT HEART CATH AND CORS/GRAFTS ANGIOGRAPHY N/A 02/21/2018   Procedure: LEFT HEART CATH AND CORS/GRAFTS ANGIOGRAPHY;  Surgeon: Lorretta Harp, MD;  Location: [place] [facility];  Service: Cardiovascular;  Laterality: N/A;  . PERIPHERAL VASCULAR INTERVENTION  06/13/2017   Procedure: PERIPHERAL VASCULAR INTERVENTION;  Surgeon: Waynetta Sandy, MD;  Location: [place] [facility];  Service: Cardiovascular;;  Bilateral Iliac Stents  . ROBOTIC ASSISTED SALPINGO OOPHERECTOMY Bilateral 03/13/2019   Procedure: XI ROBOTIC ASSISTED BILATERAL  SALPINGO OOPHORECTOMY;  Surgeon: Everitt Amber, MD;  Location: [facility];  Service: Gynecology;  Laterality: Bilateral;  . TOTAL MASTECTOMY Bilateral 11/07/2018   Procedure: BILATERAL MASTECTOMIES;  Surgeon: Stark Klein, MD;  Location: [place];  Service: General;  Laterality: Bilateral;    reports that she quit smoking about 17 years ago. Her smoking use included cigarettes. She has never used smokeless tobacco. She reports that she does not drink alcohol or use drugs. family history includes Lung cancer (age of onset: 47) in her mother; Other in an other family member. Allergies  Allergen Reactions  . Adhesive [Tape] Itching  . Latex Rash      Outpatient Encounter Medications as of 05/02/2019  Medication Sig  . amLODipine (NORVASC) 10 MG tablet Take 1 tablet (10 mg total) by mouth daily.  Marland Kitchen aspirin 81 MG tablet Take 1 tablet (81 mg total) by mouth daily. With Food  . Carboxymethylcellulose Sodium (REFRESH PLUS OP) Place 1-2 drops into both eyes as needed (for dryness or irritation only when wearing contacts).  . Cholecalciferol (VITAMIN D-3) 1000 units CAPS Take 2,000 Units by mouth daily.  . clopidogrel (PLAVIX) 75 MG tablet Take 75 mg by mouth daily.  . hydrOXYzine (ATARAX/VISTARIL) 25 MG tablet Take 1 tablet (25 mg total) by mouth at bedtime as needed.  . hyoscyamine (LEVSIN) 0.125 MG  tablet Take 1 tablet (0.125 mg total) by mouth every 4 (four) hours as needed. (Patient taking differently: Take 0.125 mg by mouth every 4 (four) hours as needed for bladder spasms or cramping. )  . ibuprofen (ADVIL) 600 MG tablet Take 1 tablet (600 mg total) by mouth every 6 (six) hours as needed. For AFTER surgery  . isosorbide mononitrate (IMDUR) 60 MG 24 hr tablet Take 1 tablet (60 mg total) by mouth daily.  Marland Kitchen lisinopril-hydrochlorothiazide (PRINZIDE,ZESTORETIC) 20-25 MG tablet Take 1 tablet by mouth daily.  . metoprolol succinate (TOPROL-XL) 100 MG 24 hr tablet Take 1 tablet (100 mg total) by mouth daily. Take with or immediately following a meal.  . montelukast (SINGULAIR) 10 MG tablet Take 1 tablet (10 mg total) by mouth at bedtime.  Marland Kitchen  Multiple Vitamins-Calcium (ONE-A-DAY WOMENS PO) Take 1 tablet by mouth daily.  . nitroGLYCERIN (NITROSTAT) 0.4 MG SL tablet Place 1 tablet (0.4 mg total) under the tongue every 5 (five) minutes as needed for chest pain. 1 tablet under tongue every 5 minutes as needed for chest discomfort (max 2 Tab/day)  . pantoprazole (PROTONIX) 40 MG tablet Take 1 tablet (40 mg total) by mouth daily.  . Potassium Chloride ER 20 MEQ TBCR Take 1 tablet daily on Monday Wednesdays Fridays and Sundays  . rosuvastatin (CRESTOR) 20 MG tablet Take 20 mg by mouth daily.  Marland Kitchen spironolactone (ALDACTONE) 25 MG tablet Take 1 tablet (25 mg total) by mouth daily.  . traMADol (ULTRAM) 50 MG tablet Take 1 tablet (50 mg total) by mouth every 6 (six) hours as needed. For AFTER surgery, do not take and drive  . venlafaxine XR (EFFEXOR-XR) 37.5 MG 24 hr capsule TAKE 1 CAPSULE BY MOUTH ONCE DAILY WITH BREAKFAST (Patient taking differently: Take 37.5 mg by mouth daily with breakfast. )  . [DISCONTINUED] senna-docusate (SENOKOT-S) 8.6-50 MG tablet Take 2 tablets by mouth at bedtime. For AFTER surgery, do not take if having diarrhea   No facility-administered encounter medications on file as of  05/02/2019.      REVIEW OF SYSTEMS  : All other systems reviewed and negative except where noted in the History of Present Illness.   PHYSICAL EXAM: BP 100/64 (BP Location: Right Arm, Patient Position: Sitting, Cuff Size: Normal)   Pulse 64   Temp 98.5 F (36.9 C)   Ht 5' 3.75" (1.619 m) Comment: height measured without shoes  Wt 144 lb 4 oz (65.4 kg)   BMI 24.96 kg/m  General: Well developed black female in no acute distress Head: Normocephalic and atraumatic Eyes:  Sclerae anicteric, conjunctiva pink. Ears: Normal auditory acuity Lungs: Clear throughout to auscultation; no increased WOB. Heart: Regular rate and rhythm; no M/R/G. Abdomen: Soft, non-distended.  BS present.  Mild diffuse TTP. Musculoskeletal: Symmetrical with no gross deformities  Skin: No lesions on visible extremities Extremities: No edema  Neurological: Alert oriented x 4, grossly non-focal Psychological:  Alert and cooperative. Normal mood and affect  ASSESSMENT AND PLAN: *Generalized abdominal pain with bloating/gas/belching: Unsure the cause of her symptoms at this point.  She is very anxious, tearful, depressed at her visit today.  We will get a CT scan of the abdomen pelvis with contrast to rule out any pathologic cause.  Will try Bentyl 10 mg twice daily for now as well.  Prescription sent to the pharmacy.  Discontinue Imodium for now.  Samples and coupons for IBgard given for her to try as well.   CC:  [name], *  ____________________________________________________________  Attending physician addendum:  Thank you for sending this case to me. I have reviewed the entire note, and the outlined plan seems appropriate.  Sounds functional.  CTAP reasonable to allay her concerns re: pathologic cause of symptoms, but likely of low yield.  Endoscopic workup also seems likely to be low yield. IB gard a good idea, dicyclomine not likely to be any more helpful than the hyoscyamine she was  reportedly already taking.    Wilfrid Lund, MD  ____________________________________________________________

## 2019-05-12 ENCOUNTER — Other Ambulatory Visit: Payer: Self-pay | Admitting: *Deleted

## 2019-05-12 MED ORDER — AMLODIPINE BESYLATE 5 MG PO TABS: 5.0000 mg | ORAL_TABLET | Freq: Every day | ORAL | 3 refills | Status: DC

## 2019-05-13 ENCOUNTER — Ambulatory Visit: Payer: Medicare Other | Admitting: Physician Assistant

## 2019-05-14 ENCOUNTER — Other Ambulatory Visit: Payer: Self-pay

## 2019-05-14 ENCOUNTER — Ambulatory Visit (INDEPENDENT_AMBULATORY_CARE_PROVIDER_SITE_OTHER)
Admission: RE | Admit: 2019-05-14 | Discharge: 2019-05-14 | Disposition: A | Discharge status: Home / Self Care | Payer: Medicare Other | Source: Ambulatory Visit | Attending: Gastroenterology | Admitting: Gastroenterology

## 2019-05-14 DIAGNOSIS — R1084 Generalized abdominal pain: Secondary | ICD-10-CM | POA: Diagnosis not present

## 2019-05-14 DIAGNOSIS — R140 Abdominal distension (gaseous): Secondary | ICD-10-CM

## 2019-05-14 MED ORDER — IOHEXOL 300 MG/ML  SOLN
100.0000 mL | Freq: Once | INTRAMUSCULAR | Status: AC | PRN
  Administered 2019-05-14: 100 mL via INTRAVENOUS

## 2019-05-15 ENCOUNTER — Encounter: Payer: Self-pay | Admitting: Internal Medicine

## 2019-05-15 MED ORDER — ZOSTER VAC RECOMB ADJUVANTED 50 MCG/0.5ML IM SUSR: 0.5000 mL | Freq: Once | INTRAMUSCULAR | 0 refills | Status: AC

## 2019-05-15 NOTE — Telephone Encounter (Signed)
I have made the patient's flu shot appointment for 9/5. With her insurance she will need an rx sent to her pharmacy for the shingrix shot.

## 2019-05-17 ENCOUNTER — Ambulatory Visit: Payer: Medicare Other

## 2019-05-17 ENCOUNTER — Other Ambulatory Visit: Payer: Self-pay

## 2019-05-20 ENCOUNTER — Other Ambulatory Visit: Payer: Self-pay

## 2019-05-20 MED ORDER — PANCRELIPASE (LIP-PROT-AMYL) 36000-114000 UNITS PO CPEP: ORAL_CAPSULE | ORAL | 3 refills | Status: DC

## 2019-05-21 ENCOUNTER — Telehealth: Payer: Self-pay | Admitting: Gastroenterology

## 2019-05-22 NOTE — Telephone Encounter (Signed)
Spoke with pt on 9/10.  All questions answered

## 2019-05-23 ENCOUNTER — Ambulatory Visit (INDEPENDENT_AMBULATORY_CARE_PROVIDER_SITE_OTHER): Payer: Medicare Other

## 2019-05-23 ENCOUNTER — Other Ambulatory Visit: Payer: Self-pay

## 2019-05-23 DIAGNOSIS — Z23 Encounter for immunization: Secondary | ICD-10-CM

## 2019-05-26 ENCOUNTER — Telehealth: Payer: Self-pay | Admitting: *Deleted

## 2019-05-26 NOTE — Telephone Encounter (Signed)
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  MRI was ordered in May of 2020 to be done in October, and scheduling does not call to schedule for scans until 1 month prior to expected date.  This RN also informed her Dr Jannifer Rodney was unaware of recent CT. Reading would be given for his review - and MRI may not be needed.  Post MD review of recent CT- recommendation given to cancel MRI - MD will discuss further follow up at next appointment.  Brenda [name] verbalized understanding and appreciation of call and discussion.

## 2019-05-28 ENCOUNTER — Other Ambulatory Visit: Payer: Self-pay

## 2019-05-28 ENCOUNTER — Encounter: Payer: Self-pay | Admitting: Internal Medicine

## 2019-05-28 ENCOUNTER — Other Ambulatory Visit (INDEPENDENT_AMBULATORY_CARE_PROVIDER_SITE_OTHER): Payer: Medicare Other

## 2019-05-28 ENCOUNTER — Ambulatory Visit (INDEPENDENT_AMBULATORY_CARE_PROVIDER_SITE_OTHER): Payer: Medicare Other | Admitting: Internal Medicine

## 2019-05-28 VITALS — BP 110/70 | HR 58 | Temp 98.9°F | Ht 63.75 in | Wt 143.0 lb

## 2019-05-28 DIAGNOSIS — K8689 Other specified diseases of pancreas: Secondary | ICD-10-CM

## 2019-05-28 DIAGNOSIS — I10 Essential (primary) hypertension: Secondary | ICD-10-CM

## 2019-05-28 DIAGNOSIS — Z0000 Encounter for general adult medical examination without abnormal findings: Secondary | ICD-10-CM | POA: Diagnosis not present

## 2019-05-28 LAB — LIPID PANEL
Cholesterol: 118 mg/dL (ref 0–200)
HDL: 42.2 mg/dL (ref >39.00)
LDL Cholesterol: 57 mg/dL (ref 0–99)
NonHDL: 75.45
Total CHOL/HDL Ratio: 3
Triglycerides: 90 mg/dL (ref 0.0–149.0)
VLDL: 18 mg/dL (ref 0.0–40.0)

## 2019-05-28 LAB — COMPREHENSIVE METABOLIC PANEL
ALT: 15 U/L (ref 0–35)
AST: 20 U/L (ref 0–37)
Albumin: 4 g/dL (ref 3.5–5.2)
Alkaline Phosphatase: 58 U/L (ref 39–117)
BUN: 16 mg/dL (ref 6–23)
CO2: 28 mEq/L (ref 19–32)
Calcium: 9.8 mg/dL (ref 8.4–10.5)
Chloride: 104 mEq/L (ref 96–112)
Creatinine, Ser: 1.02 mg/dL (ref 0.40–1.20)
GFR: 65.73 mL/min (ref >60.00)
Glucose, Bld: 88 mg/dL (ref 70–99)
Potassium: 3.8 mEq/L (ref 3.5–5.1)
Sodium: 138 mEq/L (ref 135–145)
Total Bilirubin: 0.5 mg/dL (ref 0.2–1.2)
Total Protein: 7.4 g/dL (ref 6.0–8.3)

## 2019-05-28 LAB — CBC
HCT: 35.3 % — ABNORMAL LOW (ref 36.0–46.0)
Hemoglobin: 11.7 g/dL — ABNORMAL LOW (ref 12.0–15.0)
MCHC: 33.2 g/dL (ref 30.0–36.0)
MCV: 95.6 fl (ref 78.0–100.0)
Platelets: 202 10*3/uL (ref 150.0–400.0)
RBC: 3.69 Mil/uL — ABNORMAL LOW (ref 3.87–5.11)
RDW: 13.3 % (ref 11.5–15.5)
WBC: 7.4 10*3/uL (ref 4.0–10.5)

## 2019-05-28 LAB — HEMOGLOBIN A1C: Hgb A1c MFr Bld: 6.4 % (ref 4.6–6.5)

## 2019-05-28 NOTE — Patient Instructions (Signed)
We are checking the labs today.    Health Maintenance, Female Adopting a healthy lifestyle and getting preventive care are important in promoting health and wellness. Ask your health care provider about:  The right schedule for you to have regular tests and exams.  Things you can do on your own to prevent diseases and keep yourself healthy. What should I know about diet, weight, and exercise? Eat a healthy diet   Eat a diet that includes plenty of vegetables, fruits, low-fat dairy products, and lean protein.  Do not eat a lot of foods that are high in solid fats, added sugars, or sodium. Maintain a healthy weight Body mass index (BMI) is used to identify weight problems. It estimates body fat based on height and weight. Your health care provider can help determine your BMI and help you achieve or maintain a healthy weight. Get regular exercise Get regular exercise. This is one of the most important things you can do for your health. Most adults should:  Exercise for at least 150 minutes each week. The exercise should increase your heart rate and make you sweat (moderate-intensity exercise).  Do strengthening exercises at least twice a week. This is in addition to the moderate-intensity exercise.  Spend less time sitting. Even light physical activity can be beneficial. Watch cholesterol and blood lipids Have your blood tested for lipids and cholesterol at 65 years of age, then have this test every 5 years. Have your cholesterol levels checked more often if:  Your lipid or cholesterol levels are high.  You are older than 65 years of age.  You are at high risk for heart disease. What should I know about cancer screening? Depending on your health history and family history, you may need to have cancer screening at various ages. This may include screening for:  Breast cancer.  Cervical cancer.  Colorectal cancer.  Skin cancer.  Lung cancer. What should I know about heart  disease, diabetes, and high blood pressure? Blood pressure and heart disease  High blood pressure causes heart disease and increases the risk of stroke. This is more likely to develop in people who have high blood pressure readings, are of African descent, or are overweight.  Have your blood pressure checked: ? Every 3-5 years if you are 65-26 years of age. ? Every year if you are 65 years old or older. Diabetes Have regular diabetes screenings. This checks your fasting blood sugar level. Have the screening done:  Once every three years after age 65 if you are at a normal weight and have a low risk for diabetes.  More often and at a younger age if you are overweight or have a high risk for diabetes. What should I know about preventing infection? Hepatitis B If you have a higher risk for hepatitis B, you should be screened for this virus. Talk with your health care provider to find out if you are at risk for hepatitis B infection. Hepatitis C Testing is recommended for:  Everyone born from [birth year] through 1967.  Anyone with known risk factors for hepatitis C. Sexually transmitted infections (STIs)  Get screened for STIs, including gonorrhea and chlamydia, if: ? You are sexually active and are younger than 65 years of age. ? You are older than 65 years of age and your health care provider tells you that you are at risk for this type of infection. ? Your sexual activity has changed since you were last screened, and you are at increased risk for  chlamydia or gonorrhea. Ask your health care provider if you are at risk.  Ask your health care provider about whether you are at high risk for HIV. Your health care provider may recommend a prescription medicine to help prevent HIV infection. If you choose to take medicine to prevent HIV, you should first get tested for HIV. You should then be tested every 3 months for as long as you are taking the medicine. Pregnancy  If you are about to stop  having your period (premenopausal) and you may become pregnant, seek counseling before you get pregnant.  Take 400 to 800 micrograms (mcg) of folic acid every day if you become pregnant.  Ask for birth control (contraception) if you want to prevent pregnancy. Osteoporosis and menopause Osteoporosis is a disease in which the bones lose minerals and strength with aging. This can result in bone fractures. If you are 65 years old or older, or if you are at risk for osteoporosis and fractures, ask your health care provider if you should:  Be screened for bone loss.  Take a calcium or vitamin D supplement to lower your risk of fractures.  Be given hormone replacement therapy (HRT) to treat symptoms of menopause. Follow these instructions at home: Lifestyle  Do not use any products that contain nicotine or tobacco, such as cigarettes, e-cigarettes, and chewing tobacco. If you need help quitting, ask your health care provider.  Do not use street drugs.  Do not share needles.  Ask your health care provider for help if you need support or information about quitting drugs. Alcohol use  Do not drink alcohol if: ? Your health care provider tells you not to drink. ? You are pregnant, may be pregnant, or are planning to become pregnant.  If you drink alcohol: ? Limit how much you use to 0-1 drink a day. ? Limit intake if you are breastfeeding.  Be aware of how much alcohol is in your drink. In the [place] one drink equals one 12 oz bottle of beer (355 mL), one 5 oz glass of wine (148 mL), or one 1 oz glass of hard liquor (44 mL). General instructions  Schedule regular health, dental, and eye exams.  Stay current with your vaccines.  Tell your health care provider if: ? You often feel depressed. ? You have ever been abused or do not feel safe at home. Summary  Adopting a healthy lifestyle and getting preventive care are important in promoting health and wellness.  Follow your health  care provider's instructions about healthy diet, exercising, and getting tested or screened for diseases.  Follow your health care provider's instructions on monitoring your cholesterol and blood pressure. This information is not intended to replace advice given to you by your health care provider. Make sure you discuss any questions you have with your health care provider. Document Released: 03/13/2011 Document Revised: 08/21/2018 Document Reviewed: 08/21/2018 Elsevier Patient Education  2020 Elsevier Inc.  

## 2019-05-28 NOTE — Progress Notes (Signed)
Subjective:   Patient ID: Brenda [name], female    DOB: 1955/10/08, 63 y.o.   MRN: [id number]  HPI Here for medicare wellness and physical, no new complaints. Please see A/P for status and treatment of chronic medical problems.   Diet: heart healthy  Physical activity: sedentary Depression/mood screen: negative Hearing: intact to whispered voice Visual acuity: grossly normal, performs annual eye exam  ADLs: capable Fall risk: none Home safety: good Cognitive evaluation: intact to orientation, naming, recall and repetition EOL planning: adv directives discussed    Office Visit from 05/28/2019 in [place]  PHQ-2 Total Score  1      I have personally reviewed and have noted 1. The patient's medical and social history - reviewed today no changes 2. Their use of alcohol, tobacco or illicit drugs 3. Their current medications and supplements 4. The patient's functional ability including ADL's, fall risks, home safety risks and hearing or visual impairment. 5. Diet and physical activities 6. Evidence for depression or mood disorders 7. Care team reviewed and updated  Patient Care Team: [name], MD as PCP - General (Internal Medicine) Stanford Breed Denice Bors, MD as PCP - Cardiology (Cardiology) Reynold Bowen, MD as Consulting Physician (Endocrinology) Stark Klein, MD as Consulting Physician (General Surgery) Magrinat, Virgie Dad, MD as Consulting Physician (Oncology) Irene Limbo, MD as Consulting Physician (Plastic Surgery) Juanita Craver, MD as Consulting Physician (Gastroenterology) Everitt Amber, MD as Consulting Physician (Gynecologic Oncology) Past Medical History:  Diagnosis Date  . Alcoholism ([place])   . Anxiety   . Breast cancer, left [facility]) oncologist-- dr Jana Hakim   dx 1999,  DCIS;  s/p  left lumpectomy w/ node dissection's;  completed chemo and radiation 2000:  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, Grade 2, Stage 0 (ER/PR  +);  11-07-2018  s/p  bilateral mastectomies  . Carotid artery stenosis    bilateral---  04-27-2000  s/p  left carotid endarterectomy:  last doppler in epic 08-13-2018  right ICA 40-59%,  right ECA >50%,  left ICA 1-39% ,  bilateral subclavians stenotic  . CHF (congestive heart failure) ([place])   . Chronic stable angina ([place])    followed by cardiology  . Complication of anesthesia   . Coronary artery disease cardiologist-- dr Stanford Breed   06-12-2000  s/p  CABG x5  :  cardiac cath 02-21-2018 --  normal LVF, 80% LM, occluded RCA, 95% Ramus and 100% OM,  Patent seqSVG to  PLA-PDA & LIMA to LAD,  seqSVG to OM and RI occluded  . Depression   . Family history of lung cancer   . History of CVA with residual deficit    short term memory loss per pt  . History of Helicobacter pylori infection 2001  . Hyperlipidemia   . Hypertension   . Hyperthyroidism    dx yrs ago, per pt have taken medication for awhile  . Mild mitral regurgitation    per echo 06/ 2019  . Peripheral vascular disease ([place])    managed by vascular-- dr Donzetta Matters  . PONV (postoperative nausea and vomiting)   . S/P CABG x 5 06-12-2000   by dr Lucianne Lei tright @[facility]    Past Surgical History:  Procedure Laterality Date  . ABDOMINAL AORTOGRAM W/LOWER EXTREMITY N/A 06/13/2017   Procedure: ABDOMINAL AORTOGRAM W/LOWER EXTREMITY;  Surgeon: Waynetta Sandy, MD;  Location: [place] [facility];  Service: Cardiovascular;  Laterality: N/A;  Bilateral  . ABDOMINAL HYSTERECTOMY  1998  . BREAST LUMPECTOMY WITH  AXILLARY LYMPH NODE DISSECTION Left 1999  . CARDIAC CATHETERIZATION  04-24-2000  dr Caryl Comes   3V CAD , subtotal PDA w/ slow flow colleterals , ef 48%  . CARDIAC CATHETERIZATION  06/11/2000   3V CAD with progression of LAD disease  . CAROTID ENDARTERECTOMY Left 04-27-2000   dr Amedeo Plenty  @[facility]   . CORONARY ARTERY BYPASS GRAFT  06-12-2000   dr Lucianne Lei tright @[facility]    x 5 (left internal mammary artery to left anterior  descending coronary artery  . LEFT HEART  CATH AND CORS/GRAFTS ANGIOGRAPHY N/A 02/21/2018   Procedure: LEFT HEART CATH AND CORS/GRAFTS ANGIOGRAPHY;  Surgeon: Lorretta Harp, MD;  Location: [place] [facility];  Service: Cardiovascular;  Laterality: N/A;  . PERIPHERAL VASCULAR INTERVENTION  06/13/2017   Procedure: PERIPHERAL VASCULAR INTERVENTION;  Surgeon: Waynetta Sandy, MD;  Location: [place] [facility];  Service: Cardiovascular;;  Bilateral Iliac Stents  . ROBOTIC ASSISTED SALPINGO OOPHERECTOMY Bilateral 03/13/2019   Procedure: XI ROBOTIC ASSISTED BILATERAL  SALPINGO OOPHORECTOMY;  Surgeon: Everitt Amber, MD;  Location[facility];  Service: Gynecology;  Laterality: Bilateral;  . TOTAL MASTECTOMY Bilateral 11/07/2018   Procedure: BILATERAL MASTECTOMIES;  Surgeon: Stark Klein, MD;  Location: [place];  Service: General;  Laterality: Bilateral;   Family History  Problem Relation Age of Onset  . Lung cancer Mother 37       died at an early age  . Other Other        There is no Hx of premature coronary artery disease in her family    Review of Systems  Constitutional: Positive for appetite change.  HENT: Negative.   Eyes: Negative.   Respiratory: Negative for cough, chest tightness and shortness of breath.   Cardiovascular: Negative for chest pain, palpitations and leg swelling.  Gastrointestinal: Positive for abdominal distention. Negative for abdominal pain, constipation, diarrhea, nausea and vomiting.  Musculoskeletal: Negative.   Skin: Negative.   Neurological: Negative.   Psychiatric/Behavioral: Negative.     Objective:  Physical Exam Constitutional:      Appearance: She is well-developed.  HENT:     Head: Normocephalic and atraumatic.  Neck:     Musculoskeletal: Normal range of motion.  Cardiovascular:     Rate and Rhythm: Normal rate and regular rhythm.  Pulmonary:     Effort: Pulmonary effort is normal. No respiratory distress.     Breath sounds: Normal breath sounds.  No wheezing or rales.  Abdominal:     General: Bowel sounds are normal. There is distension.     Palpations: Abdomen is soft.     Tenderness: There is no abdominal tenderness. There is no rebound.     Comments: Minimal distention  Skin:    General: Skin is warm and dry.  Neurological:     Mental Status: She is alert and oriented to person, place, and time.     Coordination: Coordination normal.     Vitals:   05/28/19 1036  BP: 110/70  Pulse: (!) 58  Temp: 98.9 F (37.2 C)  TempSrc: Oral  SpO2: 97%  Weight: 143 lb (64.9 kg)  Height: 5' 3.75" (1.619 m)    Assessment & Plan:

## 2019-05-30 ENCOUNTER — Other Ambulatory Visit: Payer: Self-pay | Admitting: Internal Medicine

## 2019-05-30 ENCOUNTER — Encounter: Payer: Self-pay | Admitting: Internal Medicine

## 2019-05-30 DIAGNOSIS — Z0000 Encounter for general adult medical examination without abnormal findings: Secondary | ICD-10-CM | POA: Insufficient documentation

## 2019-05-30 DIAGNOSIS — K8689 Other specified diseases of pancreas: Secondary | ICD-10-CM | POA: Insufficient documentation

## 2019-05-30 DIAGNOSIS — Z0001 Encounter for general adult medical examination with abnormal findings: Secondary | ICD-10-CM | POA: Insufficient documentation

## 2019-05-30 NOTE — Assessment & Plan Note (Signed)
BP at goal on amlodipine, lisinopril/hctz and metoprolol and spironolactone.

## 2019-05-30 NOTE — Assessment & Plan Note (Signed)
Flu shot up to date. Pneumonia due 2021. Shingrix counseled. Tetanus due 2030. Colonoscopy due 2023. Mammogram due 2021, pap smear aged out and dexa due counseled declines today. Counseled about sun safety and mole surveillance. Counseled about the dangers of distracted driving. Given 10 year screening recommendations.

## 2019-05-30 NOTE — Assessment & Plan Note (Signed)
Checking HgA1c and she is going to start creon soon. Talked to her about this today.

## 2019-06-02 ENCOUNTER — Telehealth: Payer: Self-pay | Admitting: Gastroenterology

## 2019-06-02 NOTE — Telephone Encounter (Signed)
I replied to the patient by portal, thanks

## 2019-06-02 NOTE — Telephone Encounter (Signed)
Dr Loletha Carrow this is a pt that saw Janett Billow for chronic pancreatitis and was prescribed Creon 36,000 units 2 with meals and 1 with snacks.  She has abdominal pain and bloating that has worsened since appt on 9/2 despite creon.  Please advise

## 2019-06-05 ENCOUNTER — Other Ambulatory Visit: Payer: Self-pay

## 2019-06-05 DIAGNOSIS — Z20822 Contact with and (suspected) exposure to covid-19: Secondary | ICD-10-CM

## 2019-06-06 LAB — NOVEL CORONAVIRUS, NAA: SARS-CoV-2, NAA: NOT DETECTED

## 2019-06-12 MED ORDER — ISOSORBIDE MONONITRATE ER 60 MG PO TB24: 60.0000 mg | ORAL_TABLET | Freq: Every day | ORAL | 11 refills | Status: DC

## 2019-06-17 ENCOUNTER — Encounter: Payer: Self-pay | Admitting: Family Medicine

## 2019-06-24 ENCOUNTER — Ambulatory Visit (INDEPENDENT_AMBULATORY_CARE_PROVIDER_SITE_OTHER): Payer: Medicare Other | Admitting: Gastroenterology

## 2019-06-24 ENCOUNTER — Encounter: Payer: Self-pay | Admitting: Gastroenterology

## 2019-06-24 VITALS — BP 118/72 | HR 62 | Temp 98.1°F | Ht 63.75 in | Wt 142.0 lb

## 2019-06-24 DIAGNOSIS — R140 Abdominal distension (gaseous): Secondary | ICD-10-CM | POA: Diagnosis not present

## 2019-06-24 DIAGNOSIS — K860 Alcohol-induced chronic pancreatitis: Secondary | ICD-10-CM | POA: Diagnosis not present

## 2019-06-24 DIAGNOSIS — K582 Mixed irritable bowel syndrome: Secondary | ICD-10-CM | POA: Diagnosis not present

## 2019-06-24 HISTORY — PX: COLONOSCOPY: SHX174

## 2019-06-24 NOTE — Progress Notes (Signed)
[facility] Progress Note  Chief Complaint: Lower abdominal pain  Subjective  History: Initial clinic visit with our PA Jessica on 05/02/2019.  Chronic abdominal pain, gas, belching, bloating.  Has been struggling with depression over medical issues and loss of a friend of cervical cancer. Alternating constipation and diarrhea. Reportedly normal colonoscopy with Dr. Collene Mares in 2012. Was prescribed dicyclomine, but had previously not had much if any improvement on hyoscyamine.  IBgard recommended as well, and CT abdomen obtained.  The CT scan showed changes of chronic pancreatitis, so we started Creon.  She called the office several days later stating that it had worsened her abdominal pain so she stopped it.  Ima is glad to report that she has been feeling much better lately.  She had already improved on the IBgard, but then felt that she had "a setback" with the Creon.  After stopping that, she has gradually returned to what she considers to be normal.  She still has some intermittent bloating and lower abdominal cramps.  Bowel habits have become regular.  Her appetite is reasonably good and weight stable. We spoke extensively about her medical history.  ROS: Cardiovascular:  no chest pain Respiratory: no dyspnea  The patient's Past Medical, Family and Social History were reviewed and are on file in the EMR.  Objective:  Med list reviewed  Current Outpatient Medications:  .  amLODipine (NORVASC) 5 MG tablet, Take 1 tablet (5 mg total) by mouth daily., Disp: 90 tablet, Rfl: 3 .  aspirin 81 MG tablet, Take 1 tablet (81 mg total) by mouth daily. With Food, Disp: 30 tablet, Rfl: 2 .  Carboxymethylcellulose Sodium (REFRESH PLUS OP), Place 1-2 drops into both eyes as needed (for dryness or irritation only when wearing contacts)., Disp: , Rfl:  .  Cholecalciferol (VITAMIN D-3) 1000 units CAPS, Take 2,000 Units by mouth daily., Disp: , Rfl:  .  clopidogrel (PLAVIX) 75 MG tablet,  Take 75 mg by mouth daily., Disp: , Rfl:  .  dicyclomine (BENTYL) 10 MG capsule, Take 1 capsule (10 mg total) by mouth 2 times daily at 12 noon and 4 pm., Disp: 60 capsule, Rfl: 2 .  hydrOXYzine (ATARAX/VISTARIL) 25 MG tablet, Take 1 tablet (25 mg total) by mouth at bedtime as needed., Disp: 12 tablet, Rfl: 0 .  hyoscyamine (LEVSIN) 0.125 MG tablet, Take 1 tablet (0.125 mg total) by mouth every 4 (four) hours as needed. (Patient taking differently: Take 0.125 mg by mouth every 4 (four) hours as needed for bladder spasms or cramping. ), Disp: 90 tablet, Rfl: 6 .  ibuprofen (ADVIL) 600 MG tablet, Take 1 tablet (600 mg total) by mouth every 6 (six) hours as needed. For AFTER surgery, Disp: 30 tablet, Rfl: 0 .  isosorbide mononitrate (IMDUR) 60 MG 24 hr tablet, Take 1 tablet (60 mg total) by mouth daily., Disp: 30 tablet, Rfl: 11 .  lipase/protease/amylase (CREON) 36000 UNITS CPEP capsule, 2 capsules during each meal and 1 capsule during each snack., Disp: 250 capsule, Rfl: 3 .  lisinopril-hydrochlorothiazide (PRINZIDE,ZESTORETIC) 20-25 MG tablet, Take 1 tablet by mouth daily., Disp: 180 tablet, Rfl: 1 .  metoprolol succinate (TOPROL-XL) 100 MG 24 hr tablet, Take 1 tablet (100 mg total) by mouth daily. Take with or immediately following a meal., Disp: 90 tablet, Rfl: 3 .  montelukast (SINGULAIR) 10 MG tablet, Take 1 tablet (10 mg total) by mouth at bedtime., Disp: 90 tablet, Rfl: 3 .  Multiple Vitamins-Calcium (ONE-A-DAY WOMENS PO), Take 1  tablet by mouth daily., Disp: , Rfl:  .  nitroGLYCERIN (NITROSTAT) 0.4 MG SL tablet, Place 1 tablet (0.4 mg total) under the tongue every 5 (five) minutes as needed for chest pain. 1 tablet under tongue every 5 minutes as needed for chest discomfort (max 2 Tab/day), Disp: 25 tablet, Rfl: 4 .  pantoprazole (PROTONIX) 40 MG tablet, Take 1 tablet (40 mg total) by mouth daily., Disp: 30 tablet, Rfl: 0 .  Potassium Chloride ER 20 MEQ TBCR, TAKE 1 TABLET BY MOUTH ON  MONDAY,WEDNESDAYS,FRIDAYS AND SUNDAYS, Disp: 30 tablet, Rfl: 6 .  rosuvastatin (CRESTOR) 20 MG tablet, Take 20 mg by mouth daily., Disp: , Rfl: 1 .  spironolactone (ALDACTONE) 25 MG tablet, Take 1 tablet (25 mg total) by mouth daily., Disp: 90 tablet, Rfl: 3 .  traMADol (ULTRAM) 50 MG tablet, Take 1 tablet (50 mg total) by mouth every 6 (six) hours as needed. For AFTER surgery, do not take and drive, Disp: 10 tablet, Rfl: 0 .  venlafaxine XR (EFFEXOR-XR) 37.5 MG 24 hr capsule, TAKE 1 CAPSULE BY MOUTH ONCE DAILY WITH BREAKFAST (Patient taking differently: Take 37.5 mg by mouth daily with breakfast. ), Disp: 90 capsule, Rfl: 1   Vital signs in last 24 hrs: Vitals:   06/24/19 1457  BP: 118/72  Pulse: 62  Temp: 98.1 F (36.7 C)  SpO2: 98%    Physical Exam  Well-appearing, good muscle mass  HEENT: sclera anicteric, oral mucosa moist without lesions  Neck: supple, no thyromegaly, JVD or lymphadenopathy  Cardiac: RRR without murmurs, S1S2 heard, no peripheral edema  Pulm: clear to auscultation bilaterally, normal RR and effort noted  Abdomen: soft, no tenderness, with active bowel sounds. No guarding or palpable hepatosplenomegaly.  Skin; warm and dry, no jaundice or rash   Radiologic studies:  CLINICAL DATA:  63 year old female with history of lower abdominal pain, bloating and increased gas. History of recent oophorectomy in July 2020. Additional history of left-sided breast cancer diagnosed in 1999 treated with lumpectomy and radiation therapy. Recurrence of left breast cancer November, status post mastectomy in March 2020.   EXAM: CT ABDOMEN AND PELVIS WITH CONTRAST   TECHNIQUE: Multidetector CT imaging of the abdomen and pelvis was performed using the standard protocol following bolus administration of intravenous contrast.   CONTRAST:  137mL OMNIPAQUE IOHEXOL 300 MG/ML  SOLN   COMPARISON:  No priors.   FINDINGS: Lower chest: Atherosclerotic calcifications in the  descending thoracic aorta as well as the right coronary artery. Median sternotomy wires.   Hepatobiliary: No suspicious cystic or solid hepatic lesions. No intra or extrahepatic biliary ductal dilatation. Gallbladder is nearly completely decompressed, but otherwise unremarkable in appearance.   Pancreas: Well-defined 8 mm low-attenuation lesion in the body of the pancreas (axial image 22 of series 2). Multiple coarse calcifications are noted throughout the body of the pancreas as well, likely reflective of chronic pancreatitis. No other suspicious pancreatic mass. No peripancreatic fluid collections or inflammatory changes.   Spleen: Unremarkable.   Adrenals/Urinary Tract: Severe cortical atrophy in the upper and interpolar region of the right kidney. Left kidney is normal in appearance. Bilateral adrenal glands are normal in appearance. No hydroureteronephrosis. Urinary bladder is normal in appearance.   Stomach/Bowel: Normal appearance of the stomach. No pathologic dilatation of small bowel or colon. Normal appendix.   Vascular/Lymphatic: Aortic atherosclerosis, without evidence of aneurysm or dissection in the abdominal or pelvic vasculature. Vascular stents are noted in the common iliac arteries bilaterally, which appear grossly patent. Two  right-sided renal arteries. At the ostium of the branch to the upper pole of the right kidney there is a densely calcified atherosclerotic plaque with associated stenosis. No lymphadenopathy noted in the abdomen or pelvis.   Reproductive: Status post hysterectomy. Ovaries are not confidently identified may be surgically absent or atrophic.   Other: No significant volume of ascites.  No pneumoperitoneum.   Musculoskeletal: There are no aggressive appearing lytic or blastic lesions noted in the visualized portions of the skeleton.   IMPRESSION: 1. No acute findings are noted in the abdomen or pelvis to account for the patient's  symptoms. 2. 8 mm low-attenuation lesion in the body of the pancreas which has a benign appearance, favored to represent a tiny pancreatic pseudocyst. This is upon a background of morphologic changes suggestive of chronic pancreatitis. This recommendation follows ACR consensus guidelines: Management of Incidental Pancreatic Cysts: A White Paper of the ACR Incidental Findings Committee. J Am Coll Radiol [id number]. 3. Severe cortical atrophy in the upper and interpolar region of the right kidney, likely sequela of prior infarction given the prominent atherosclerotic plaque at the ostium of the right renal artery to the upper pole. 4. Aortic atherosclerosis, in addition to least right coronary artery disease. Please note that although the presence of coronary artery calcium documents the presence of coronary artery disease, the severity of this disease and any potential stenosis cannot be assessed on this non-gated CT examination. Assessment for potential risk factor modification, dietary therapy or pharmacologic therapy may be warranted, if clinically indicated. 5. Additional incidental findings, as above.     Electronically Signed   By: Vinnie Langton M.D.   On: 05/14/2019 11:01    Last cardiac catheterization in June 2019 showed multivessel coronary disease after distant CABG, no intervention taken.  Recommended medical management.  @ASSESSMENTPLANBEGIN @ Assessment: Encounter Diagnoses  Name Primary?  . Irritable bowel syndrome with both constipation and diarrhea Yes  . Abdominal bloating   . Alcohol-induced chronic pancreatitis (HCC)    Her symptoms were most consistent with irritable bowel, and it still seems that way as it has evolved.  It was not clear if the CT findings of chronic pancreatitis were the cause of her GI symptoms, but it would seem not at this point.  The Creon clearly did not agree with her either.  She has noticed some food triggers, some  additional dietary recommendations were given. She will continue IBgard 1 or 2 capsules a day as long as it continues to help and is affordable.  Some samples and a coupon were given.  No plans for the scopic testing or other additional work-up at this point. See me as needed.   Total time 20 minutes, over half spent face-to-face with patient in counseling and coordination of care.   Nelida Meuse III

## 2019-06-24 NOTE — Patient Instructions (Signed)
If you are age 65 or older, your body mass index should be between 23-30. Your Body mass index is 24.57 kg/m. If this is out of the aforementioned range listed, please consider follow up with your Primary Care Provider.  If you are age 70 or younger, your body mass index should be between 19-25. Your Body mass index is 24.57 kg/m. If this is out of the aformentioned range listed, please consider follow up with your Primary Care Provider.   Medication Samples have been provided to the patient.  Drug name: Grant Ruts: 12  LOTII:[id number] cb  Exp.Date: 03-2021  It was a pleasure to see you today!  Dr. Loletha Carrow

## 2019-07-01 ENCOUNTER — Other Ambulatory Visit: Payer: Self-pay | Admitting: Cardiology

## 2019-07-03 ENCOUNTER — Other Ambulatory Visit: Payer: Self-pay

## 2019-07-03 ENCOUNTER — Ambulatory Visit: Payer: Self-pay

## 2019-07-03 ENCOUNTER — Ambulatory Visit (HOSPITAL_COMMUNITY): Payer: Medicare Other

## 2019-07-03 ENCOUNTER — Other Ambulatory Visit (INDEPENDENT_AMBULATORY_CARE_PROVIDER_SITE_OTHER): Payer: Medicare Other

## 2019-07-03 ENCOUNTER — Encounter: Payer: Self-pay | Admitting: Family Medicine

## 2019-07-03 ENCOUNTER — Ambulatory Visit (INDEPENDENT_AMBULATORY_CARE_PROVIDER_SITE_OTHER): Payer: Medicare Other | Admitting: Family Medicine

## 2019-07-03 VITALS — BP 120/70 | HR 71 | Ht 63.75 in | Wt 144.4 lb

## 2019-07-03 DIAGNOSIS — T148XXA Other injury of unspecified body region, initial encounter: Secondary | ICD-10-CM | POA: Diagnosis not present

## 2019-07-03 DIAGNOSIS — M2550 Pain in unspecified joint: Secondary | ICD-10-CM

## 2019-07-03 DIAGNOSIS — M79652 Pain in left thigh: Secondary | ICD-10-CM | POA: Diagnosis not present

## 2019-07-03 LAB — CBC WITH DIFFERENTIAL/PLATELET
Basophils Absolute: 0.1 10*3/uL (ref 0.0–0.1)
Basophils Relative: 0.7 % (ref 0.0–3.0)
Eosinophils Absolute: 0.1 10*3/uL (ref 0.0–0.7)
Eosinophils Relative: 1.4 % (ref 0.0–5.0)
HCT: 33.8 % — ABNORMAL LOW (ref 36.0–46.0)
Hemoglobin: 11.3 g/dL — ABNORMAL LOW (ref 12.0–15.0)
Lymphocytes Relative: 30.2 % (ref 12.0–46.0)
Lymphs Abs: 2.1 10*3/uL (ref 0.7–4.0)
MCHC: 33.5 g/dL (ref 30.0–36.0)
MCV: 95.7 fl (ref 78.0–100.0)
Monocytes Absolute: 0.6 10*3/uL (ref 0.1–1.0)
Monocytes Relative: 8.1 % (ref 3.0–12.0)
Neutro Abs: 4.2 10*3/uL (ref 1.4–7.7)
Neutrophils Relative %: 59.6 % (ref 43.0–77.0)
Platelets: 174 10*3/uL (ref 150.0–400.0)
RBC: 3.53 Mil/uL — ABNORMAL LOW (ref 3.87–5.11)
RDW: 13.7 % (ref 11.5–15.5)
WBC: 7.1 10*3/uL (ref 4.0–10.5)

## 2019-07-03 LAB — COMPREHENSIVE METABOLIC PANEL
ALT: 16 U/L (ref 0–35)
AST: 21 U/L (ref 0–37)
Albumin: 4.1 g/dL (ref 3.5–5.2)
Alkaline Phosphatase: 60 U/L (ref 39–117)
BUN: 18 mg/dL (ref 6–23)
CO2: 27 mEq/L (ref 19–32)
Calcium: 9.3 mg/dL (ref 8.4–10.5)
Chloride: 107 mEq/L (ref 96–112)
Creatinine, Ser: 1.15 mg/dL (ref 0.40–1.20)
GFR: 57.22 mL/min — ABNORMAL LOW (ref >60.00)
Glucose, Bld: 95 mg/dL (ref 70–99)
Potassium: 3.7 mEq/L (ref 3.5–5.1)
Sodium: 140 mEq/L (ref 135–145)
Total Bilirubin: 0.8 mg/dL (ref 0.2–1.2)
Total Protein: 7.1 g/dL (ref 6.0–8.3)

## 2019-07-03 LAB — VITAMIN D 25 HYDROXY (VIT D DEFICIENCY, FRACTURES): VITD: 43.58 ng/mL (ref 30.00–100.00)

## 2019-07-03 LAB — FERRITIN: Ferritin: 128.8 ng/mL (ref 10.0–291.0)

## 2019-07-03 LAB — PROTIME-INR
INR: 1.1 ratio — ABNORMAL HIGH (ref 0.8–1.0)
Prothrombin Time: 13.1 s (ref 9.6–13.1)

## 2019-07-03 LAB — TSH: TSH: 0.62 u[IU]/mL (ref 0.35–4.50)

## 2019-07-03 LAB — IBC PANEL
Iron: 75 ug/dL (ref 42–145)
Saturation Ratios: 24 % (ref 20.0–50.0)
Transferrin: 223 mg/dL (ref 212.0–360.0)

## 2019-07-03 LAB — C-REACTIVE PROTEIN: CRP: <1 mg/dL (ref 0.5–20.0)

## 2019-07-03 LAB — SEDIMENTATION RATE: Sed Rate: 41 mm/hr — ABNORMAL HIGH (ref 0–30)

## 2019-07-03 LAB — TESTOSTERONE: Testosterone: 21.69 ng/dL (ref 15.00–40.00)

## 2019-07-03 LAB — URIC ACID: Uric Acid, Serum: 4.8 mg/dL (ref 2.4–7.0)

## 2019-07-03 NOTE — Progress Notes (Signed)
[facility] [place], [place] [address] Phone: [phone] Subjective:      CC: Bruising on the legs  [id number]    I, Wendy Poet, LAT, ATC, am serving as scribe for Dr. Hulan Saas.  06/01/17: Severe peripheral vascular disease bilaterally. Patient is following up with vascular this week. Likely will be having surgical intervention. I do not feel that any other medication changes would be extremely beneficial at this time. Patient will have the addition of ciprofloxacin to help with the healing. Patient will continue taking other treatment options at this time.  Update- 07/03/19 Brenda [name] is a 63 y.o. female coming in with complaint of B thigh bruising w/ associated "knots" in her quads.  She reports that she noticed the bruising on the R thigh first which has subsided and then the L thigh which has faded but is still present.  She also notes a bruised patch and knot on her L upper arm.  She has no pain other than w/ palpation of the knots in her muscles.  She finally reports increased warmth in her B feet.  Patient thinks that it does feel different.  Does not understand why this is occurring.  Very concerned with patient having a recent surgeries for breast cancer in [place] vascular problems.      Past Medical History:  Diagnosis Date  . Alcoholism ([place])   . Anxiety   . 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 Carotid artery stenosis    bilateral---  04-27-2000  s/p  left carotid endarterectomy:  last doppler in epic 08-13-2018  right ICA 40-59%,  right ECA >50%,  left ICA 1-39% ,  bilateral subclavians stenotic  . CHF (congestive heart failure) ([place])   . Chronic stable angina ([place])    followed by  cardiology  . Complication of anesthesia   . Coronary artery disease cardiologist-- dr Stanford Breed   06-12-2000  s/p  CABG x5  :  cardiac cath 02-21-2018 --  normal LVF, 80% LM, occluded RCA, 95% Ramus and 100% OM,  Patent seqSVG to  PLA-PDA & LIMA to LAD,  seqSVG to OM and RI occluded  . Depression   . Family history of lung cancer   . History of CVA with residual deficit    short term memory loss per pt  . History of Helicobacter pylori infection 2001  . Hyperlipidemia   . Hypertension   . Hyperthyroidism    dx yrs ago, per pt have taken medication for awhile  . Mild mitral regurgitation    per echo 06/ 2019  . Peripheral vascular disease ([place])    managed by vascular-- dr Donzetta Matters  . PONV (postoperative nausea and vomiting)   . S/P CABG x 5 06-12-2000   by dr Lucianne Lei tright @[facility]    Past Surgical History:  Procedure Laterality Date  . ABDOMINAL AORTOGRAM W/LOWER EXTREMITY N/A 06/13/2017   Procedure: ABDOMINAL AORTOGRAM W/LOWER EXTREMITY;  Surgeon: Waynetta Sandy, MD;  Location: [facility];  Service: Cardiovascular;  Laterality: N/A;  Bilateral  . ABDOMINAL HYSTERECTOMY  1998  . BREAST LUMPECTOMY WITH AXILLARY LYMPH NODE DISSECTION Left 1999  . CARDIAC CATHETERIZATION  04-24-2000  dr Caryl Comes   3V CAD ,  subtotal PDA w/ slow flow colleterals , ef 48%  . CARDIAC CATHETERIZATION  06/11/2000   3V CAD with progression of LAD disease  . CAROTID ENDARTERECTOMY Left 04-27-2000   dr Amedeo Plenty  @[facility]   . CORONARY ARTERY BYPASS GRAFT  06-12-2000   dr Lucianne Lei tright @[facility]    x 5 (left internal mammary artery to left anterior  descending coronary artery  . LEFT HEART CATH AND CORS/GRAFTS ANGIOGRAPHY N/A 02/21/2018   Procedure: LEFT HEART CATH AND CORS/GRAFTS ANGIOGRAPHY;  Surgeon: Lorretta Harp, MD;  Location: [facility];  Service: Cardiovascular;  Laterality: N/A;  . PERIPHERAL VASCULAR INTERVENTION  06/13/2017   Procedure: PERIPHERAL VASCULAR INTERVENTION;  Surgeon: Waynetta Sandy,  MD;  Location: [facility];  Service: Cardiovascular;;  Bilateral Iliac Stents  . ROBOTIC ASSISTED SALPINGO OOPHERECTOMY Bilateral 03/13/2019   Procedure: XI ROBOTIC ASSISTED BILATERAL  SALPINGO OOPHORECTOMY;  Surgeon: Everitt Amber, MD;  [facility];  Service: Gynecology;  Laterality: Bilateral;  . TOTAL MASTECTOMY Bilateral 11/07/2018   Procedure: BILATERAL MASTECTOMIES;  Surgeon: Stark Klein, MD;  Location: [facility];  Service: General;  Laterality: Bilateral;   Social History   Socioeconomic History  . Marital status: Single    Spouse name: Not on file  . Number of children: 1  . Years of education: Not on file  . Highest education level: Not on file  Occupational History  . 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 Financial resource strain: Not on file  . Food insecurity    Worry: Not on file    Inability: Not on file  . Transportation needs    Medical: Not on file    Non-medical: Not on file  Tobacco Use  . Smoking status: Former Smoker    Types: Cigarettes    Quit date: 09/11/2001    Years since quitting: 17.8  . Smokeless tobacco: Never Used  Substance and Sexual Activity  . Alcohol use: No  . Drug use: No  . Sexual activity: Not on file  Lifestyle  . Physical activity    Days per week: Not on file    Minutes per session: Not on file  . Stress: Not on file  Relationships  . Social Herbalist on phone: Not on file    Gets together: Not on file    Attends religious service: Not on file    Active member of club or organization: Not on file    Attends meetings of clubs or organizations: Not on file    Relationship status: Not on file  Other Topics Concern  . Not on file  Social History Narrative  . Not on file   Allergies  Allergen Reactions  . Adhesive [Tape] Itching  . Latex Rash   Family History  Problem Relation Age of Onset  . Lung cancer Mother 30        died at an early age  . Other Other        There is no Hx of premature coronary artery disease in her family     Current Outpatient Medications (Cardiovascular):  .  amLODipine (NORVASC) 5 MG tablet, Take 1 tablet (5 mg total) by mouth daily. .  isosorbide mononitrate (IMDUR) 60 MG 24 hr tablet, Take 1 tablet (60 mg total) by mouth daily. Marland Kitchen  lisinopril-hydrochlorothiazide (ZESTORETIC) 20-25 MG tablet, Take 1 tablet by mouth once daily .  metoprolol succinate (TOPROL-XL)  100 MG 24 hr tablet, Take 1 tablet (100 mg total) by mouth daily. Take with or immediately following a meal. .  nitroGLYCERIN (NITROSTAT) 0.4 MG SL tablet, Place 1 tablet (0.4 mg total) under the tongue every 5 (five) minutes as needed for chest pain. 1 tablet under tongue every 5 minutes as needed for chest discomfort (max 2 Tab/day) .  rosuvastatin (CRESTOR) 20 MG tablet, Take 20 mg by mouth daily. Marland Kitchen  spironolactone (ALDACTONE) 25 MG tablet, Take 1 tablet (25 mg total) by mouth daily.  Current Outpatient Medications (Respiratory):  .  montelukast (SINGULAIR) 10 MG tablet, Take 1 tablet (10 mg total) by mouth at bedtime.  Current Outpatient Medications (Analgesics):  .  aspirin 81 MG tablet, Take 1 tablet (81 mg total) by mouth daily. With Food .  ibuprofen (ADVIL) 600 MG tablet, Take 1 tablet (600 mg total) by mouth every 6 (six) hours as needed. For AFTER surgery  Current Outpatient Medications (Hematological):  .  clopidogrel (PLAVIX) 75 MG tablet, Take 75 mg by mouth daily.  Current Outpatient Medications (Other):  Marland Kitchen  Carboxymethylcellulose Sodium (REFRESH PLUS OP), Place 1-2 drops into both eyes as needed (for dryness or irritation only when wearing contacts). .  Cholecalciferol (VITAMIN D-3) 1000 units CAPS, Take 2,000 Units by mouth daily. Marland Kitchen  dicyclomine (BENTYL) 10 MG capsule, Take 1 capsule (10 mg total) by mouth 2 times daily at 12 noon and 4 pm. .  hydrOXYzine (ATARAX/VISTARIL) 25 MG tablet, Take 1 tablet  (25 mg total) by mouth at bedtime as needed. .  hyoscyamine (LEVSIN) 0.125 MG tablet, Take 1 tablet (0.125 mg total) by mouth every 4 (four) hours as needed. (Patient taking differently: Take 0.125 mg by mouth every 4 (four) hours as needed for bladder spasms or cramping. ) .  lipase/protease/amylase (CREON) 36000 UNITS CPEP capsule, 2 capsules during each meal and 1 capsule during each snack. .  Multiple Vitamins-Calcium (ONE-A-DAY WOMENS PO), Take 1 tablet by mouth daily. .  pantoprazole (PROTONIX) 40 MG tablet, Take 1 tablet (40 mg total) by mouth daily. .  Potassium Chloride ER 20 MEQ TBCR, TAKE 1 TABLET BY MOUTH ON MONDAY,WEDNESDAYS,FRIDAYS AND SUNDAYS .  venlafaxine XR (EFFEXOR-XR) 37.5 MG 24 hr capsule, TAKE 1 CAPSULE BY MOUTH ONCE DAILY WITH BREAKFAST (Patient taking differently: Take 37.5 mg by mouth daily with breakfast. )    Past medical history, social, surgical and family history all reviewed in electronic medical record.  No pertanent information unless stated regarding to the chief complaint.   Review of Systems:  No headache, visual changes, nausea, vomiting, diarrhea, constipation, dizziness, abdominal pain, skin rash, fevers, chills, night sweats, weight loss, swollen lymph nodes, body aches, joint swelling,  chest pain, shortness of breath, mood changes.  Positive muscle aches  Objective  Blood pressure 120/70, pulse 71, height 5' 3.75" (1.619 m), weight 144 lb 6.4 oz (65.5 kg), SpO2 97 %.    General: No apparent distress alert and oriented x3 mood and affect normal, dressed appropriately.  HEENT: Pupils equal, extraocular movements intact  Respiratory: Patient's speak in full sentences and does not appear short of breath  Cardiovascular: No lower extremity edema, non tender, no erythema  Skin: Warm dry intact with no signs of infection or rash on extremities or on axial skeleton.  Patient did have some large bruises on the inside of her thighs bilaterally but seems to be  resolving.  No true hematoma palpated. Abdomen: Soft nontender  Neuro: Cranial nerves II through XII  are intact, neurovascularly intact in all extremities with 2+ DTRs and 2+ pulses.  Lymph: No lymphadenopathy of posterior or anterior cervical chain or axillae bilaterally.  Gait normal with good balance and coordination.  MSK:  Non tender with full range of motion and good stability and symmetric strength and tone of shoulders, elbows, wrist, hip, knee and ankles bilaterally.  Limited musculoskeletal ultrasound was performed and interpreted by Lyndal Pulley  Limited ultrasound of patient's lower extremity near the area of bruising only saw some mild resolving bruising of the muscle.  No abnormal vascularity.  Likely nowhere near the 6 stents.   Impression and Recommendations:     This case required medical decision making of moderate complexity. The above documentation has been reviewed and is accurate and complete Lyndal Pulley, DO       Note: This dictation was prepared with Dragon dictation along with smaller phrase technology. Any transcriptional errors that result from this process are unintentional.

## 2019-07-03 NOTE — Patient Instructions (Signed)
Labwork downstairs today  Vascular doppler of B LEs and ABI bilaterally ordered  We'll touch base through MyChart as we get results

## 2019-07-03 NOTE — Assessment & Plan Note (Signed)
Patient with bruising of his on the lower extremities and one on the upper extremities.  Past medical history significant for breast cancer status post bilateral mastectomy.  Patient also has had significant amount of vascular compromise over the course of time and I would like further evaluation patient's blood vessels.  We discussed laboratory work-up to look at platelets and clotting factors to make sure patient does not have anything abnormal.  Patient has been feeling good and denies any fevers chills or any abnormal weight loss.  We will monitor closely.  May need vascular surgeon again depending.  Patient's distal pulses though still seem brisk today.  We will discuss with patient once we have further work-up available to [place].

## 2019-07-05 LAB — ANGIOTENSIN CONVERTING ENZYME: Angiotensin-Converting Enzyme: 28 U/L (ref 9–67)

## 2019-07-05 LAB — ANA: Anti Nuclear Antibody (ANA): NEGATIVE

## 2019-07-05 LAB — PTH, INTACT AND CALCIUM
Calcium: 9.1 mg/dL (ref 8.6–10.4)
PTH: 66 pg/mL — ABNORMAL HIGH (ref 14–64)

## 2019-07-05 LAB — CYCLIC CITRUL PEPTIDE ANTIBODY, IGG: Cyclic Citrullin Peptide Ab: <16 UNITS

## 2019-07-05 LAB — CALCIUM, IONIZED: Calcium, Ion: 5.04 mg/dL (ref 4.8–5.6)

## 2019-07-05 LAB — RHEUMATOID FACTOR: Rheumatoid fact SerPl-aCnc: <14 IU/mL (ref <14)

## 2019-07-05 LAB — LACTATE DEHYDROGENASE: LDH: 155 U/L (ref 120–250)

## 2019-07-09 ENCOUNTER — Telehealth: Payer: Self-pay | Admitting: *Deleted

## 2019-07-09 ENCOUNTER — Other Ambulatory Visit: Payer: Self-pay

## 2019-07-09 ENCOUNTER — Ambulatory Visit (HOSPITAL_COMMUNITY)
Admission: RE | Admit: 2019-07-09 | Discharge: 2019-07-09 | Disposition: A | Discharge status: Home / Self Care | Payer: Medicare Other | Source: Ambulatory Visit | Attending: Cardiology | Admitting: Cardiology

## 2019-07-09 ENCOUNTER — Other Ambulatory Visit (HOSPITAL_COMMUNITY): Payer: Self-pay | Admitting: Family Medicine

## 2019-07-09 DIAGNOSIS — M79605 Pain in left leg: Secondary | ICD-10-CM | POA: Diagnosis not present

## 2019-07-09 DIAGNOSIS — I739 Peripheral vascular disease, unspecified: Secondary | ICD-10-CM

## 2019-07-09 NOTE — Telephone Encounter (Signed)
Fixed order and left a message

## 2019-07-09 NOTE — Telephone Encounter (Signed)
Felicia @ [facility] called stating the pt is scheduled for today to have a Doppler done. The order was not entered correctly & should be entered for LLE.

## 2019-07-09 NOTE — Addendum Note (Signed)
Addended by: Douglass Rivers T on: 07/09/2019 07:41 AM   Modules accepted: Orders

## 2019-07-10 ENCOUNTER — Encounter: Payer: Self-pay | Admitting: Family Medicine

## 2019-07-14 ENCOUNTER — Other Ambulatory Visit: Payer: Self-pay

## 2019-07-14 ENCOUNTER — Ambulatory Visit (HOSPITAL_COMMUNITY)
Admission: RE | Admit: 2019-07-14 | Discharge: 2019-07-14 | Disposition: A | Discharge status: Home / Self Care | Payer: Medicare Other | Source: Ambulatory Visit | Attending: Cardiology | Admitting: Cardiology

## 2019-07-14 ENCOUNTER — Ambulatory Visit: Payer: Medicare Other | Admitting: Oncology

## 2019-07-14 ENCOUNTER — Other Ambulatory Visit: Payer: Self-pay | Admitting: Family Medicine

## 2019-07-14 DIAGNOSIS — M79652 Pain in left thigh: Secondary | ICD-10-CM | POA: Diagnosis present

## 2019-07-14 DIAGNOSIS — I739 Peripheral vascular disease, unspecified: Secondary | ICD-10-CM | POA: Insufficient documentation

## 2019-07-14 NOTE — Progress Notes (Signed)
[place]  [phone] [phone]    ID: BREUNA LOVEALL DOB: 04-23-1956  MR#: [id number]  TFT#:[id number]  Patient Care Team: [name], MD as PCP - General (Internal Medicine) Stanford Breed Denice Bors, MD as PCP - Cardiology (Cardiology) Reynold Bowen, MD as Consulting Physician (Endocrinology) Stark Klein, MD as Consulting Physician (General Surgery) Magrinat, Virgie Dad, MD as Consulting Physician (Oncology) Irene Limbo, MD as Consulting Physician (Plastic Surgery) Juanita Craver, MD as Consulting Physician (Gastroenterology) Everitt Amber, MD as Consulting Physician (Gynecologic Oncology) OTHER MD:   CHIEF COMPLAINT: PALB2 POSITIVE Ductal carcinoma in situ, estrogen receptor positive (s/p bilateral mastectomies)  CURRENT TREATMENT: Observation   INTERVAL HISTORY: Yevonne was seen today for follow-up of her 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 She continues under observation.   Since her last visit here, she underwent prophylactic BSO on 03/13/2019 under Dr. Denman George. Pathology from the procedure ([id number]) showed: corpora albicantia of both ovaries; histologically unremarkable fallopian tubes.  She underwent coronavirus testing on 03/19/2019 and 06/05/2019, both of which were negative.  She also underwent echocardiogram on 03/21/2019, which showed an ejection fraction of 60-65%.  She presented to the ED on 05/01/2019 with worsening abdominal pain, bloating, and increased gas. Exam performed in the ED showed no infection or occult bleeding, and the patient had a scheduled appointment with gastro the following day.   In light of these increased symptoms, she was sent for abdomen/pelvis CT scan on 05/14/2019, which showed: no acute findings to account for patient's symptoms; 8 mm low-attenuation lesion in the body of the pancreas with a benign appearance; severe cortical atrophy in the upper and interpolar region of the right  kidney, likely sequela of prior infarction.  She presented to Dr. Tamala Julian in internal medicine on 07/03/2019 with bilateral thigh bruising with associated "knots" in her quads. She underwent limited musculoskeletal ultrasound of the lower left extremity that day, which showed no abnormal vascularity.  She also underwent lower venous study on 07/09/2019, which showed no evidence of DVT.  She also underwent lower extremity doppler study yesterday, 07/14/2019, which showed no evidence of significant lower extremity arterial disease in either leg.   REVIEW OF SYSTEMS: Jezel is still dealing with the changed body image from losing her breasts.  She wonders how this will affect for example dating.  We discussed that at length today.  She has lost a couple of friends to cervical cancer recently and is very concerned regarding cancer recurrence.  Otherwise a detailed review of systems today was stable  HISTORY OF CURRENT ILLNESS: From the original intake note:  Alessa Mazur has a remote history of breast cancer, and she underwent a lumpectomy of the left breast in 1999.  She does not remember the size of the tumor but knows that 8 lymph nodes were removed and that they were all clear.  She was treated with chemotherapy and radiation.  She does not recall other details.  She did not receive antiestrogens  More recently, on 08/12/2018 Cy[place] had routine screening mammography showing a possible abnormality in the left breast. She underwent unilateral left diagnostic mammography with tomography at [facility] on 08/15/2018 showing: Breast Density Category B. Spot compression magnification views were performed over the upper outer far posterior left breast. There are grouped microcalcifications in the region of the lumpectomy site in the far upper outer posterior left breast varying in shape, size, and density, spanning approximately 2.7 cm.   Accordingly on 08/19/2018 she proceeded to  biopsy of the left  breast area in question. The pathology from this procedure showed ([id number]): ductal carcinoma in situ, intermediate nuclear grade with calcifications. Prognostic indicators significant for: estrogen receptor, 100% positive and progesterone receptor, 90% positive, both with strong staining intensity.   The patient's subsequent history is as detailed above.   PAST MEDICAL HISTORY: Past Medical History:  Diagnosis Date  . Alcoholism ([place])   . Anxiety   . 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 Carotid artery stenosis    bilateral---  04-27-2000  s/p  left carotid endarterectomy:  last doppler in epic 08-13-2018  right ICA 40-59%,  right ECA >50%,  left ICA 1-39% ,  bilateral subclavians stenotic  . CHF (congestive heart failure) ([place])   . Chronic stable angina ([place])    followed by cardiology  . Complication of anesthesia   . Coronary artery disease cardiologist-- dr Stanford Breed   06-12-2000  s/p  CABG x5  :  cardiac cath 02-21-2018 --  normal LVF, 80% LM, occluded RCA, 95% Ramus and 100% OM,  Patent seqSVG to  PLA-PDA & LIMA to LAD,  seqSVG to OM and RI occluded  . Depression   . Family history of lung cancer   . History of CVA with residual deficit    short term memory loss per pt  . History of Helicobacter pylori infection 2001  . Hyperlipidemia   . Hypertension   . Hyperthyroidism    dx yrs ago, per pt have taken medication for awhile  . Mild mitral regurgitation    per echo 06/ 2019  . Peripheral vascular disease ([place])    managed by vascular-- dr Donzetta Matters  . PONV (postoperative nausea and vomiting)   . S/P CABG x 5 06-12-2000   by dr Lucianne Lei tright @[facility]      PAST SURGICAL HISTORY: Past Surgical History:  Procedure Laterality Date  . ABDOMINAL AORTOGRAM  W/LOWER EXTREMITY N/A 06/13/2017   Procedure: ABDOMINAL AORTOGRAM W/LOWER EXTREMITY;  Surgeon: Waynetta Sandy, MD;  Location: [facility];  Service: Cardiovascular;  Laterality: N/A;  Bilateral  . ABDOMINAL HYSTERECTOMY  1998  . BREAST LUMPECTOMY WITH AXILLARY LYMPH NODE DISSECTION Left 1999  . CARDIAC CATHETERIZATION  04-24-2000  dr Caryl Comes   3V CAD , subtotal PDA w/ slow flow colleterals , ef 48%  . CARDIAC CATHETERIZATION  06/11/2000   3V CAD with progression of LAD disease  . CAROTID ENDARTERECTOMY Left 04-27-2000   dr Amedeo Plenty  @[facility]   . CORONARY ARTERY BYPASS GRAFT  06-12-2000   dr Lucianne Lei tright @[facility]    x 5 (left internal mammary artery to left anterior  descending coronary artery  . LEFT HEART CATH AND CORS/GRAFTS ANGIOGRAPHY N/A 02/21/2018   Procedure: LEFT HEART CATH AND CORS/GRAFTS ANGIOGRAPHY;  Surgeon: Lorretta Harp, MD;  Location: [facility];  Service: Cardiovascular;  Laterality: N/A;  . PERIPHERAL VASCULAR INTERVENTION  06/13/2017   Procedure: PERIPHERAL VASCULAR INTERVENTION;  Surgeon: Waynetta Sandy, MD;  Location: [facility];  Service: Cardiovascular;;  Bilateral Iliac Stents  . ROBOTIC ASSISTED SALPINGO OOPHERECTOMY Bilateral 03/13/2019   Procedure: XI ROBOTIC ASSISTED BILATERAL  SALPINGO OOPHORECTOMY;  Surgeon: Everitt Amber, MD;  Location: [facility];  Service: Gynecology;  Laterality: Bilateral;  .  TOTAL MASTECTOMY Bilateral 11/07/2018   Procedure: BILATERAL MASTECTOMIES;  Surgeon: Stark Klein, MD;  Location: [facility];  Service: General;  Laterality: Bilateral;     FAMILY HISTORY: Family History  Problem Relation Age of Onset  . Lung cancer Mother 91       died at an early age  . Other Other        There is no Hx of premature coronary artery disease in her family   [facility]'s has no information on her father.  The patient's mother died when she was 54 years old and the patient was put in foster care and  partly raised by great aunts. The patient has 1 sister, but she does not know her sister's medical history.   GYNECOLOGIC HISTORY:  No LMP recorded. Patient has had a hysterectomy. Menarche age 55, first live birth age 80, the patient is GX P1.  She had hysterectomy around age 62.  She did not receive hormone replacement.  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  She is now retired. 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  Daughter [name], age 52, works for the [employer] but currently is under [employer] because of an injury inflicted by a Ship broker.  The patient has 2 granddaughters, age 79 and 2 as of January 2019.  At home is just the patient. Goldy's sister lives outside of [place], [place]: Not in place   HEALTH MAINTENANCE: Social History   Tobacco Use  . Smoking status: Former Smoker    Types: Cigarettes    Quit date: 09/11/2001    Years since quitting: 17.8  . Smokeless tobacco: Never Used  Substance Use Topics  . Alcohol use: No  . Drug use: No    Colonoscopy: January 2012  PAP: Status post hysterectomy  Bone density: May 2012 at [facility], T score -0.5   Allergies  Allergen Reactions  . Adhesive [Tape] Itching  . Latex Rash    Current Outpatient Medications  Medication Sig Dispense Refill  . amLODipine (NORVASC) 5 MG tablet Take 1 tablet (5 mg total) by mouth daily. 90 tablet 3  . aspirin 81 MG tablet Take 1 tablet (81 mg total) by mouth daily. With Food 30 tablet 2  . Carboxymethylcellulose Sodium (REFRESH PLUS OP) Place 1-2 drops into both eyes as needed (for dryness or irritation only when wearing contacts).    . Cholecalciferol (VITAMIN D-3) 1000 units CAPS Take 2,000 Units by mouth daily.    . clopidogrel (PLAVIX) 75 MG tablet Take 75 mg by mouth daily.    Marland Kitchen dicyclomine (BENTYL) 10 MG  capsule Take 1 capsule (10 mg total) by mouth 2 times daily at 12 noon and 4 pm. 60 capsule 2  . hydrOXYzine (ATARAX/VISTARIL) 25 MG tablet Take 1 tablet (25 mg total) by mouth at bedtime as needed. 12 tablet 0  . hyoscyamine (LEVSIN) 0.125 MG tablet Take 1 tablet (0.125 mg total) by mouth every 4 (four) hours as needed. (Patient taking differently: Take 0.125 mg by mouth every 4 (four) hours as needed for bladder spasms or cramping. ) 90 tablet 6  . ibuprofen (ADVIL) 600 MG tablet Take 1 tablet (600 mg total) by mouth every 6 (six) hours as needed. For AFTER surgery 30 tablet 0  . isosorbide mononitrate (IMDUR) 60 MG 24 hr tablet Take 1  tablet (60 mg total) by mouth daily. 30 tablet 11  . lipase/protease/amylase (CREON) 36000 UNITS CPEP capsule 2 capsules during each meal and 1 capsule during each snack. 250 capsule 3  . lisinopril-hydrochlorothiazide (ZESTORETIC) 20-25 MG tablet Take 1 tablet by mouth once daily 90 tablet 2  . metoprolol succinate (TOPROL-XL) 100 MG 24 hr tablet Take 1 tablet (100 mg total) by mouth daily. Take with or immediately following a meal. 90 tablet 3  . montelukast (SINGULAIR) 10 MG tablet Take 1 tablet (10 mg total) by mouth at bedtime. 90 tablet 3  . Multiple Vitamins-Calcium (ONE-A-DAY WOMENS PO) Take 1 tablet by mouth daily.    . nitroGLYCERIN (NITROSTAT) 0.4 MG SL tablet Place 1 tablet (0.4 mg total) under the tongue every 5 (five) minutes as needed for chest pain. 1 tablet under tongue every 5 minutes as needed for chest discomfort (max 2 Tab/day) 25 tablet 4  . pantoprazole (PROTONIX) 40 MG tablet Take 1 tablet (40 mg total) by mouth daily. 30 tablet 0  . Potassium Chloride ER 20 MEQ TBCR TAKE 1 TABLET BY MOUTH ON MONDAY,WEDNESDAYS,FRIDAYS AND SUNDAYS 30 tablet 6  . rosuvastatin (CRESTOR) 20 MG tablet Take 20 mg by mouth daily.  1  . spironolactone (ALDACTONE) 25 MG tablet Take 1 tablet (25 mg total) by mouth daily. 90 tablet 3  . venlafaxine XR (EFFEXOR-XR) 37.5 MG  24 hr capsule TAKE 1 CAPSULE BY MOUTH ONCE DAILY WITH BREAKFAST (Patient taking differently: Take 37.5 mg by mouth daily with breakfast. ) 90 capsule 1   No current facility-administered medications for this visit.      OBJECTIVE: Middle-aged African-American woman who appears stated age  27:   07/15/19 1210  BP: 134/60  Pulse: (!) 57  Resp: 18  Temp: 98.2 F (36.8 C)  SpO2: 100%     Body mass index is 24.43 kg/m.   Wt Readings from Last 3 Encounters:  07/15/19 141 lb 3.2 oz (64 kg)  07/03/19 144 lb 6.4 oz (65.5 kg)  06/24/19 142 lb (64.4 kg)      ECOG FS:1 - Symptomatic but completely ambulatory  Sclerae unicteric, EOMs intact Wearing a mask No cervical or supraclavicular adenopathy Lungs no rales or rhonchi Heart regular rate and rhythm Abd soft, nontender, positive bowel sounds MSK no focal spinal tenderness, no upper extremity lymphedema Neuro: nonfocal, well oriented, appropriate affect Breasts: Status post bilateral mastectomies.  There is no evidence of chest wall recurrence.  Both axillae are benign.  LAB RESULTS:  CMP     Component Value Date/Time   NA 138 07/15/2019 1134   NA 141 02/28/2017 1131   K 3.8 07/15/2019 1134   CL 106 07/15/2019 1134   CO2 20 (L) 07/15/2019 1134   GLUCOSE 91 07/15/2019 1134   BUN 20 07/15/2019 1134   BUN 13 02/28/2017 1131   CREATININE 1.04 (H) 07/15/2019 1134   CREATININE 1.14 (H) 10/08/2018 1109   CREATININE 1.04 (H) 01/31/2017 0915   CALCIUM 9.7 07/15/2019 1134   PROT 7.7 07/15/2019 1134   ALBUMIN 4.0 07/15/2019 1134   AST 20 07/15/2019 1134   AST 22 10/08/2018 1109   ALT 17 07/15/2019 1134   ALT 22 10/08/2018 1109   ALKPHOS 67 07/15/2019 1134   BILITOT 0.9 07/15/2019 1134   BILITOT 0.9 10/08/2018 1109   GFRNONAA 56 (L) 07/15/2019 1134   GFRNONAA 51 (L) 10/08/2018 1109   GFRAA >60 07/15/2019 1134   GFRAA 59 (L) 10/08/2018 1109    No  results found for: TOTALPROTELP, ALBUMINELP, A1GS, A2GS, BETS, BETA2SER,  GAMS, MSPIKE, SPEI  No results found for: KPAFRELGTCHN, LAMBDASER, KAPLAMBRATIO  Lab Results  Component Value Date   WBC 7.5 07/15/2019   NEUTROABS 4.5 07/15/2019   HGB 12.1 07/15/2019   HCT 36.8 07/15/2019   MCV 96.6 07/15/2019   PLT 207 07/15/2019    @LASTCHEMISTRY @  No results found for: LABCA2  No components found for: [id number]  No results for input(s): INR in the last 168 hours.  No results found for: LABCA2  No results found for: WUJ811  No results found for: BJY782  No results found for: NFA213  No results found for: CA2729  No components found for: HGQUANT  No results found for: CEA1 / No results found for: CEA1   No results found for: AFPTUMOR  No results found for: CHROMOGRNA  No results found for: PSA1  Appointment on 07/15/2019  Component Date Value Ref Range Status  . WBC 07/15/2019 7.5  4.0 - 10.5 K/uL Final  . RBC 07/15/2019 3.81* 3.87 - 5.11 MIL/uL Final  . Hemoglobin 07/15/2019 12.1  12.0 - 15.0 g/dL Final  . HCT 07/15/2019 36.8  36.0 - 46.0 % Final  . MCV 07/15/2019 96.6  80.0 - 100.0 fL Final  . MCH 07/15/2019 31.8  26.0 - 34.0 pg Final  . MCHC 07/15/2019 32.9  30.0 - 36.0 g/dL Final  . RDW 07/15/2019 13.2  11.5 - 15.5 % Final  . Platelets 07/15/2019 207  150 - 400 K/uL Final  . nRBC 07/15/2019 0.0  0.0 - 0.2 % Final  . Neutrophils Relative % 07/15/2019 59  % Final  . Neutro Abs 07/15/2019 4.5  1.7 - 7.7 K/uL Final  . Lymphocytes Relative 07/15/2019 31  % Final  . Lymphs Abs 07/15/2019 2.3  0.7 - 4.0 K/uL Final  . Monocytes Relative 07/15/2019 7  % Final  . Monocytes Absolute 07/15/2019 0.5  0.1 - 1.0 K/uL Final  . Eosinophils Relative 07/15/2019 2  % Final  . Eosinophils Absolute 07/15/2019 0.1  0.0 - 0.5 K/uL Final  . Basophils Relative 07/15/2019 1  % Final  . Basophils Absolute 07/15/2019 0.0  0.0 - 0.1 K/uL Final  . Immature Granulocytes 07/15/2019 0  % Final  . Abs Immature Granulocytes 07/15/2019 0.01  0.00 - 0.07 K/uL Final    Performed at [facility], [place] [address] [place], [place] [address]  . Sodium 07/15/2019 138  135 - 145 mmol/L Final  . Potassium 07/15/2019 3.8  3.5 - 5.1 mmol/L Final  . Chloride 07/15/2019 106  98 - 111 mmol/L Final  . CO2 07/15/2019 20* 22 - 32 mmol/L Final  . Glucose, Bld 07/15/2019 91  70 - 99 mg/dL Final  . BUN 07/15/2019 20  8 - 23 mg/dL Final  . Creatinine, Ser 07/15/2019 1.04* 0.44 - 1.00 mg/dL Final  . Calcium 07/15/2019 9.7  8.9 - 10.3 mg/dL Final  . Total Protein 07/15/2019 7.7  6.5 - 8.1 g/dL Final  . Albumin 07/15/2019 4.0  3.5 - 5.0 g/dL Final  . AST 07/15/2019 20  15 - 41 U/L Final  . ALT 07/15/2019 17  0 - 44 U/L Final  . Alkaline Phosphatase 07/15/2019 67  38 - 126 U/L Final  . Total Bilirubin 07/15/2019 0.9  0.3 - 1.2 mg/dL Final  . GFR calc non Af Amer 07/15/2019 56* >60 mL/min Final  . GFR calc Af Amer 07/15/2019 >60  >60 mL/min Final  .  Anion gap 07/15/2019 12  5 - 15 Final   Performed at [facility], [place] [address], [place], [place] [address]    (this displays the last labs from the last 3 days)  No results found for: TOTALPROTELP, ALBUMINELP, A1GS, A2GS, BETS, BETA2SER, GAMS, MSPIKE, SPEI (this displays SPEP labs)  No results found for: KPAFRELGTCHN, LAMBDASER, KAPLAMBRATIO (kappa/lambda light chains)  No results found for: HGBA, HGBA2QUANT, HGBFQUANT, HGBSQUAN (Hemoglobinopathy evaluation)   Lab Results  Component Value Date   LDH 155 07/03/2019    Lab Results  Component Value Date   IRON 75 07/03/2019   IRONPCTSAT 24.0 07/03/2019   (Iron and TIBC)  Lab Results  Component Value Date   FERRITIN 128.8 07/03/2019    Urinalysis    Component Value Date/Time   COLORURINE YELLOW 02/21/2019 0906   APPEARANCEUR CLEAR 02/21/2019 0906   LABSPEC 1.014 02/21/2019 0906   PHURINE 5.0 02/21/2019 0906   GLUCOSEU NEGATIVE 02/21/2019 0906   HGBUR NEGATIVE 02/21/2019 0906   BILIRUBINUR NEGATIVE  02/21/2019 0906   KETONESUR NEGATIVE 02/21/2019 0906   PROTEINUR NEGATIVE 02/21/2019 0906   NITRITE NEGATIVE 02/21/2019 0906   LEUKOCYTESUR NEGATIVE 02/21/2019 6606     STUDIES:  [place] Limited Joint Space Structures Low Left  Result Date: 07/04/2019 Limited musculoskeletal ultrasound was performed and interpreted by Lyndal Pulley Limited ultrasound of patient's lower extremity near the area of bruising only saw some mild resolving bruising of the muscle. No abnormal vascularity. Likely nowhere near the 6 stents.   Vas [place] Burnard Bunting With/wo Tbi  Result Date: 07/14/2019 LOWER EXTREMITY DOPPLER STUDY Indications: Peripheral artery disease, and Patient complains of left thigh              pain. She denies any claudication and no rest pain. High Risk         Hypertension, hyperlipidemia, past history of smoking, prior Factors:          CVA.  Vascular Interventions: Bilateral common iliac artery stents placed                         06/13/2017-Peripheral vascular disease is managed by                         vascular surgery.S/p left CEA with patch angioplasty in                         2001, and CABG with LIMA in 2001. Comparison Study: In 05/2018, an arterial Doppler done at another facility showed                   a right ABI of 1.09 and left 1.09. Performing Technologist: Wilkie Aye RVT  Examination Guidelines: A complete evaluation includes at minimum, Doppler waveform signals and systolic blood pressure reading at the level of bilateral brachial, anterior tibial, and posterior tibial arteries, when vessel segments are accessible. Bilateral testing is considered an integral part of a complete examination. Photoelectric Plethysmograph (PPG) waveforms and toe systolic pressure readings are included as required and additional duplex testing as needed. Limited examinations for reoccurring indications may be performed as noted.  ABI Findings: +---------+------------------+-----+---------+--------+ Right    Rt  Pressure (mmHg)IndexWaveform Comment  +---------+------------------+-----+---------+--------+ Brachial 135                                      +---------+------------------+-----+---------+--------+  ATA      151               1.01 triphasic         +---------+------------------+-----+---------+--------+ PTA      165               1.11 triphasic         +---------+------------------+-----+---------+--------+ PERO     145               0.97 triphasic         +---------+------------------+-----+---------+--------+ Great Toe116               0.78 Normal            +---------+------------------+-----+---------+--------+ +---------+------------------+-----+---------+-------+ Left     Lt Pressure (mmHg)IndexWaveform Comment +---------+------------------+-----+---------+-------+ Brachial 149                                     +---------+------------------+-----+---------+-------+ ATA      161               1.08 triphasic        +---------+------------------+-----+---------+-------+ PTA      175               1.17 triphasic        +---------+------------------+-----+---------+-------+ PERO     160               1.07 triphasic        +---------+------------------+-----+---------+-------+ Doristine Devoid Toe125               0.84 Normal           +---------+------------------+-----+---------+-------+ +-------+-----------+-----------+------------+------------+ ABI/TBIToday's ABIToday's TBIPrevious ABIPrevious TBI +-------+-----------+-----------+------------+------------+ Right  1.11       0.78       1.09        0.73         +-------+-----------+-----------+------------+------------+ Left   1.17       0.84       1.09        0.79         +-------+-----------+-----------+------------+------------+ Bilateral ABIs and TBIs appear essentially unchanged compared to prior study on 05/2018.  Summary: Right: Resting right ankle-brachial index is within normal  range. No evidence of significant right lower extremity arterial disease. The right toe-brachial index is normal. Left: Resting left ankle-brachial index is within normal range. No evidence of significant left lower extremity arterial disease. The left toe-brachial index is normal.  *See table(s) above for measurements and observations.  Electronically signed by Jenkins Rouge MD on 07/14/2019 at 9:53:02 AM.    Final    Vas [place] Lower Extremity Venous (dvt)  Result Date: 07/10/2019  Lower Venous Study Indications: Bilateral thigh pain, with palpable knots and discoloration that started about one month ago. Today, there is no discoloration or palpable areas in either leg, nor pain with the right thigh. There is still some residual pain with the left thigh. Patient denies SOB.  Comparison Study: NA Performing Technologist: Sharlett Iles RVT  Examination Guidelines: A complete evaluation includes B-mode imaging, spectral Doppler, color Doppler, and power Doppler as needed of all accessible portions of each vessel. Bilateral testing is considered an integral part of a complete examination. Limited examinations for reoccurring indications may be performed as noted.  +-----+---------------+---------+-----------+----------+--------------+ RIGHTCompressibilityPhasicitySpontaneityPropertiesThrombus Aging +-----+---------------+---------+-----------+----------+--------------+ CFV  Full           Yes  Yes                                 +-----+---------------+---------+-----------+----------+--------------+  +---------+---------------+---------+-----------+----------+--------------+ LEFT     CompressibilityPhasicitySpontaneityPropertiesThrombus Aging +---------+---------------+---------+-----------+----------+--------------+ CFV      Full           Yes      Yes                                 +---------+---------------+---------+-----------+----------+--------------+ SFJ      Full            Yes      Yes                                 +---------+---------------+---------+-----------+----------+--------------+ FV Prox  Full           Yes      Yes                                 +---------+---------------+---------+-----------+----------+--------------+ FV Mid   Full                                                        +---------+---------------+---------+-----------+----------+--------------+ FV DistalFull           Yes      Yes                                 +---------+---------------+---------+-----------+----------+--------------+ PFV      Full           Yes      Yes                                 +---------+---------------+---------+-----------+----------+--------------+ POP      Full           Yes      Yes                                 +---------+---------------+---------+-----------+----------+--------------+ PTV      Full           Yes      Yes                                 +---------+---------------+---------+-----------+----------+--------------+ PERO     Full           Yes      Yes                                 +---------+---------------+---------+-----------+----------+--------------+ Gastroc  Full                                                        +---------+---------------+---------+-----------+----------+--------------+  GSV      Full           Yes      Yes                                 +---------+---------------+---------+-----------+----------+--------------+  Findings reported to Dr Hulan Saas via Epic messaging at 3:52 pm.  Summary: Right: No evidence of common femoral vein obstruction. Left: No evidence of deep vein thrombosis in the lower extremity. No indirect evidence of obstruction proximal to the inguinal ligament.  *See table(s) above for measurements and observations. Electronically signed by Ida Rogue MD on 07/10/2019 at 9:30:21 PM.    Final      ELIGIBLE FOR AVAILABLE RESEARCH  PROTOCOL: no   ASSESSMENT: 63 y.o. 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  (a) no additional mutations were noted in APC, ATM, AXIN2, BARD1, BMPR1A, BRCA1, BRCA2, BRIP1, BUB1B, CDH1, CDK4, CDKN2A, CHEK2, CTNNA1, DICER1, ENG, EGFR, EPCAM, GALNT12, GREM1, HOXB13, KIT, MEN1, MLH1, MLH3, MSH2, MSH3, MSH6, MUTYH, NBN, NF1, NTHL1, PALB2, PDGFRA, PMS2, POLD1, POLE, PTEN, RAD50, RAD51C, RAD51D, RNF43, RPS20, SDHA, SDHB, SDHC, SDHD, SMAD4, SMARCA4, STK11, TP53, TSC1, TSC2, VHL.  (3) s/p bilateral mastectomies 11/07/2018 showing  (a) on the right, no evidence of carcinoma  (b) on the left, no evidence of residual ductal carcinoma in situ  (4) ovarian cancer risk: BSO recommended given lack of family history information  (a) status post remote hysterectomy  (b) bilateral salpingo-oophorectomy 03/31/2019, with benign pathology.  (5) pancreatic cancer risk:  (a) MRI of the abdomen with and without contrast June 29, 2017 documented 0.8 cm cyst in the tail of the pancreas appearing to communicate with the main duct.    (b) MRCP 07/04/2018 shows the known cyst in the tail of the pancreas to measure 0.7 cm   (c) MRCP OCT 2020  PLAN: Rayvin has been appropriately aggressive in management of her mutation.  She has had both her breasts and both her ovaries and fallopian tubes removed.  I feel she is almost certainly cured from her breast cancer and of course she did not have ovarian cancer.  We reviewed her recent CT of the abdomen which shows what appears to be a benign pancreatic cyst essentially  unchanged from last year.  I do not think we need to repeat an MRCP this year or perhaps even next year.  She would prefer to see [place] on a once a year basis for at least the next few years and I am comfortable with that  We discussed some social issues in detail.  She knows to call for any other issue that may develop before then.  Magrinat, Virgie Dad, MD  07/15/19 12:41 PM Medical Oncology and Hematology [facility] [place], [place] [address] Tel. [phone]    Fax. [phone]    I, Wilburn Mylar, am acting as scribe for Dr. Virgie Dad. Magrinat.  I, Lurline Del MD, have reviewed the above documentation for accuracy and completeness, and I agree with the above.

## 2019-07-15 ENCOUNTER — Inpatient Hospital Stay (HOSPITAL_BASED_OUTPATIENT_CLINIC_OR_DEPARTMENT_OTHER): Payer: Medicare Other | Admitting: Oncology

## 2019-07-15 ENCOUNTER — Inpatient Hospital Stay: Payer: Medicare Other | Attending: Oncology

## 2019-07-15 ENCOUNTER — Other Ambulatory Visit: Payer: Self-pay

## 2019-07-15 VITALS — BP 134/60 | HR 57 | Temp 98.2°F | Resp 18 | Ht 63.75 in | Wt 141.2 lb

## 2019-07-15 DIAGNOSIS — Z923 Personal history of irradiation: Secondary | ICD-10-CM | POA: Insufficient documentation

## 2019-07-15 DIAGNOSIS — Z171 Estrogen receptor negative status [ER-]: Secondary | ICD-10-CM

## 2019-07-15 DIAGNOSIS — Z801 Family history of malignant neoplasm of trachea, bronchus and lung: Secondary | ICD-10-CM | POA: Diagnosis not present

## 2019-07-15 DIAGNOSIS — Z87891 Personal history of nicotine dependence: Secondary | ICD-10-CM | POA: Diagnosis not present

## 2019-07-15 DIAGNOSIS — Z90710 Acquired absence of both cervix and uterus: Secondary | ICD-10-CM | POA: Insufficient documentation

## 2019-07-15 DIAGNOSIS — F329 Major depressive disorder, single episode, unspecified: Secondary | ICD-10-CM | POA: Insufficient documentation

## 2019-07-15 DIAGNOSIS — Z170 Estrogen receptor positive status [ER+]: Secondary | ICD-10-CM | POA: Diagnosis not present

## 2019-07-15 DIAGNOSIS — Z90722 Acquired absence of ovaries, bilateral: Secondary | ICD-10-CM | POA: Insufficient documentation

## 2019-07-15 DIAGNOSIS — C50112 Malignant neoplasm of central portion of left female breast: Secondary | ICD-10-CM | POA: Diagnosis not present

## 2019-07-15 DIAGNOSIS — Z791 Long term (current) use of non-steroidal anti-inflammatories (NSAID): Secondary | ICD-10-CM | POA: Diagnosis not present

## 2019-07-15 DIAGNOSIS — F419 Anxiety disorder, unspecified: Secondary | ICD-10-CM | POA: Diagnosis not present

## 2019-07-15 DIAGNOSIS — D0512 Intraductal carcinoma in situ of left breast: Secondary | ICD-10-CM | POA: Diagnosis present

## 2019-07-15 DIAGNOSIS — Z7982 Long term (current) use of aspirin: Secondary | ICD-10-CM | POA: Insufficient documentation

## 2019-07-15 DIAGNOSIS — Z9079 Acquired absence of other genital organ(s): Secondary | ICD-10-CM | POA: Diagnosis not present

## 2019-07-15 DIAGNOSIS — I10 Essential (primary) hypertension: Secondary | ICD-10-CM | POA: Diagnosis not present

## 2019-07-15 DIAGNOSIS — E785 Hyperlipidemia, unspecified: Secondary | ICD-10-CM | POA: Insufficient documentation

## 2019-07-15 DIAGNOSIS — Z9221 Personal history of antineoplastic chemotherapy: Secondary | ICD-10-CM | POA: Diagnosis not present

## 2019-07-15 DIAGNOSIS — Z9013 Acquired absence of bilateral breasts and nipples: Secondary | ICD-10-CM | POA: Diagnosis not present

## 2019-07-15 DIAGNOSIS — Z79899 Other long term (current) drug therapy: Secondary | ICD-10-CM | POA: Insufficient documentation

## 2019-07-15 LAB — CBC WITH DIFFERENTIAL/PLATELET
Abs Immature Granulocytes: 0.01 10*3/uL (ref 0.00–0.07)
Basophils Absolute: 0 10*3/uL (ref 0.0–0.1)
Basophils Relative: 1 %
Eosinophils Absolute: 0.1 10*3/uL (ref 0.0–0.5)
Eosinophils Relative: 2 %
HCT: 36.8 % (ref 36.0–46.0)
Hemoglobin: 12.1 g/dL (ref 12.0–15.0)
Immature Granulocytes: 0 %
Lymphocytes Relative: 31 %
Lymphs Abs: 2.3 10*3/uL (ref 0.7–4.0)
MCH: 31.8 pg (ref 26.0–34.0)
MCHC: 32.9 g/dL (ref 30.0–36.0)
MCV: 96.6 fL (ref 80.0–100.0)
Monocytes Absolute: 0.5 10*3/uL (ref 0.1–1.0)
Monocytes Relative: 7 %
Neutro Abs: 4.5 10*3/uL (ref 1.7–7.7)
Neutrophils Relative %: 59 %
Platelets: 207 10*3/uL (ref 150–400)
RBC: 3.81 MIL/uL — ABNORMAL LOW (ref 3.87–5.11)
RDW: 13.2 % (ref 11.5–15.5)
WBC: 7.5 10*3/uL (ref 4.0–10.5)
nRBC: 0 % (ref 0.0–0.2)

## 2019-07-15 LAB — COMPREHENSIVE METABOLIC PANEL
ALT: 17 U/L (ref 0–44)
AST: 20 U/L (ref 15–41)
Albumin: 4 g/dL (ref 3.5–5.0)
Alkaline Phosphatase: 67 U/L (ref 38–126)
Anion gap: 12 (ref 5–15)
BUN: 20 mg/dL (ref 8–23)
CO2: 20 mmol/L — ABNORMAL LOW (ref 22–32)
Calcium: 9.7 mg/dL (ref 8.9–10.3)
Chloride: 106 mmol/L (ref 98–111)
Creatinine, Ser: 1.04 mg/dL — ABNORMAL HIGH (ref 0.44–1.00)
GFR calc Af Amer: >60 mL/min (ref >60)
GFR calc non Af Amer: 56 mL/min — ABNORMAL LOW (ref >60)
Glucose, Bld: 91 mg/dL (ref 70–99)
Potassium: 3.8 mmol/L (ref 3.5–5.1)
Sodium: 138 mmol/L (ref 135–145)
Total Bilirubin: 0.9 mg/dL (ref 0.3–1.2)
Total Protein: 7.7 g/dL (ref 6.5–8.1)

## 2019-07-16 ENCOUNTER — Telehealth: Payer: Self-pay | Admitting: Oncology

## 2019-07-16 NOTE — Telephone Encounter (Signed)
I talk with patient regarding schedule  

## 2019-08-04 ENCOUNTER — Telehealth: Payer: Self-pay | Admitting: Gastroenterology

## 2019-08-04 NOTE — Telephone Encounter (Signed)
Doctor of the Day This is a patient of Dr Loletha Carrow last seen 06/24/2019.   Patient reports she begin not feeling well on Friday. She had abdominal pain which developed into diarrhea and vomiting on Saturday. She could not keep anything down. Vomited the Lomotil she was trying to take for her diarrhea. This continued through the day. Sunday she was "empty" and very hungry. She ate homemade shrimp and pasta. She became sick again, vomiting, diarrhea and intestinal gas with abdominal pain. Her self-care treatments have been lomothyoscyamineil  IBgard, heating pad and ginger.  She is presently without diarrhea or vomiting, but states her abdomen hurts since she ate a fruit bar this morning. She is off and on tearful then angry talking about her symptoms. States she feels weak since she cannot eat.

## 2019-08-05 ENCOUNTER — Emergency Department (HOSPITAL_COMMUNITY)
Admission: EM | Admit: 2019-08-05 | Discharge: 2019-08-05 | Discharge status: Against Medical Advice | Payer: Medicare Other | Attending: Emergency Medicine | Admitting: Emergency Medicine

## 2019-08-05 ENCOUNTER — Encounter (HOSPITAL_COMMUNITY): Payer: Self-pay | Admitting: Emergency Medicine

## 2019-08-05 DIAGNOSIS — R112 Nausea with vomiting, unspecified: Secondary | ICD-10-CM | POA: Diagnosis present

## 2019-08-05 DIAGNOSIS — R197 Diarrhea, unspecified: Secondary | ICD-10-CM

## 2019-08-05 DIAGNOSIS — Z5321 Procedure and treatment not carried out due to patient leaving prior to being seen by health care provider: Secondary | ICD-10-CM | POA: Diagnosis not present

## 2019-08-05 LAB — COMPREHENSIVE METABOLIC PANEL
ALT: 22 U/L (ref 0–44)
AST: 24 U/L (ref 15–41)
Albumin: 4.5 g/dL (ref 3.5–5.0)
Alkaline Phosphatase: 63 U/L (ref 38–126)
Anion gap: 13 (ref 5–15)
BUN: 32 mg/dL — ABNORMAL HIGH (ref 8–23)
CO2: 21 mmol/L — ABNORMAL LOW (ref 22–32)
Calcium: 10.2 mg/dL (ref 8.9–10.3)
Chloride: 99 mmol/L (ref 98–111)
Creatinine, Ser: 1.76 mg/dL — ABNORMAL HIGH (ref 0.44–1.00)
GFR calc Af Amer: 35 mL/min — ABNORMAL LOW (ref >60)
GFR calc non Af Amer: 30 mL/min — ABNORMAL LOW (ref >60)
Glucose, Bld: 135 mg/dL — ABNORMAL HIGH (ref 70–99)
Potassium: 3.4 mmol/L — ABNORMAL LOW (ref 3.5–5.1)
Sodium: 133 mmol/L — ABNORMAL LOW (ref 135–145)
Total Bilirubin: 1.8 mg/dL — ABNORMAL HIGH (ref 0.3–1.2)
Total Protein: 8.3 g/dL — ABNORMAL HIGH (ref 6.5–8.1)

## 2019-08-05 LAB — LIPASE, BLOOD: Lipase: 45 U/L (ref 11–51)

## 2019-08-05 LAB — CBC
HCT: 41.7 % (ref 36.0–46.0)
Hemoglobin: 14.3 g/dL (ref 12.0–15.0)
MCH: 32.4 pg (ref 26.0–34.0)
MCHC: 34.3 g/dL (ref 30.0–36.0)
MCV: 94.6 fL (ref 80.0–100.0)
Platelets: 264 10*3/uL (ref 150–400)
RBC: 4.41 MIL/uL (ref 3.87–5.11)
RDW: 12.1 % (ref 11.5–15.5)
WBC: 11.5 10*3/uL — ABNORMAL HIGH (ref 4.0–10.5)
nRBC: 0 % (ref 0.0–0.2)

## 2019-08-05 LAB — URINALYSIS, ROUTINE W REFLEX MICROSCOPIC
Bilirubin Urine: NEGATIVE
Glucose, UA: NEGATIVE mg/dL
Hgb urine dipstick: NEGATIVE
Ketones, ur: NEGATIVE mg/dL
Nitrite: NEGATIVE
Protein, ur: NEGATIVE mg/dL
Specific Gravity, Urine: 1.008 (ref 1.005–1.030)
pH: 5 (ref 5.0–8.0)

## 2019-08-05 MED ORDER — SODIUM CHLORIDE 0.9 % IV BOLUS: 1000.0000 mL | Freq: Once | INTRAVENOUS | Status: DC

## 2019-08-05 MED ORDER — SODIUM CHLORIDE 0.9% FLUSH: 3.0000 mL | Freq: Once | INTRAVENOUS | Status: DC

## 2019-08-05 NOTE — Telephone Encounter (Signed)
I'm just seeing this message - I was out of the office yesterday and have been doing procedures all day.  I have thus far felt she has IBS, but these sound like more severe symptoms than I have heard her having before. Looks like she is in the ED now - the ED provider will see her and contact us - perhaps she needs to be admitted.

## 2019-08-05 NOTE — Telephone Encounter (Signed)
Pt calling regarding this msg.  Pt in severe pain and would some advice in reducing pain. Does not want to go to ER with Covid virus. Please Advise

## 2019-08-05 NOTE — ED Triage Notes (Signed)
Pt states since Friday she has been having ibs flare- pt states she has been having n/v/d and has not been able to eat any solid foods.

## 2019-08-05 NOTE — ED Provider Notes (Signed)
Patient's name was moved on the board, but patient never presented to the room she was assigned. Patient's chart now states discharged, and I was not able to evaluate her.  Marianna Payment, D.O. Date 08/05/2019 Time 5:46 PM [facility], PGY-1 Pager: [phone]    Marianna Payment, MD 08/05/19 Lorayne Bender, MD 08/05/19 2231

## 2019-08-05 NOTE — Telephone Encounter (Signed)
Patient calling again and would like to speak to a nurse.

## 2019-08-05 NOTE — Telephone Encounter (Signed)
Dr Loletha Carrow can you review and advise on the patient? She called yesterday.  I have called her today. She states she had a soft formed bowel movement today. Vomited last about 45 minutes ago. Her abdomen still hurts. She is sipping ginger ale and using a heating pad to the lower abdomen for comfort.

## 2019-08-06 MED ORDER — ONDANSETRON HCL 4 MG PO TABS: 4.0000 mg | ORAL_TABLET | Freq: Three times a day (TID) | ORAL | 1 refills | Status: DC | PRN

## 2019-08-06 NOTE — Telephone Encounter (Signed)
#  [phone] Pt said she has been waiting since Monday and wants to know what she needs to do about her IBS

## 2019-08-06 NOTE — Telephone Encounter (Signed)
Patient left the ER last night before she was seen.  Spoke with her today. She has not had any further vomiting. She still has a lot of intestinal gas that is making her stomach hurt. She purchased Gas-X. Took 1 immediately and is waiting to see if this helps. She has tolerated broth and peppermint tea. Had a soft formed stool today. Compared to our previous conversations, it does sound like she has some improvement. Would you have any recommendations at this point.

## 2019-08-06 NOTE — Telephone Encounter (Signed)
Brenda [name],  She left ED without being seen, but did have some labs drawn upon arrival.  They show she was probably dehydrated, most likely from vomiting.  I sent a prescription to her pharmacy for ondansetron 4 mg tablets for nausea and vomiting.  1 to 2 tablets every 8 hours as needed.  Start at low-dose and increase slowly as needed, because it can also slow down the bowel habits.  Hopefully this will help keep her out of the emergency room.  I do not have any particular medicines that are likely be helpful for gas or for abdominal pain. (For documentation purposes only, QTC normal on most recent EKG)  Per the discussion at our last office visit, her symptoms seem most consistent with irritable bowel, and I still suspect there is a significant probable anxiety trigger to that given what she is gone through with her  medical issues this year.  I messaged her primary care provider with those concerns and asked them to consider how they could be helpful in that regard.  When I most recently saw her in clinic, her symptoms had subsided, so endoscopic testing did not seem warranted.  Seeing that symptoms have worsened to this degree, I have reconsidered it and feel that upper endoscopy and colonoscopy are warranted to rule out other digestive conditions.  If she is agreeable, please make the arrangements for this in the [place] (most recent LVEF normal, so okay for [place]).  She would need to be off Plavix 5 days prior to procedure if agreeable to her cardiologist. Also, take a dose of Zofran before each dose of bowel preparation.   Dr. Sharlet Salina,  fyi - patient I messaged you about yesterday

## 2019-08-06 NOTE — Telephone Encounter (Signed)
Spoke with Brenda [name]. She is very happy with this plan of care. Arrangements made for EGD/colon. She needs the Zofran sent to [facility] on [address].

## 2019-08-07 LAB — URINE CULTURE: Culture: <10000 — AB

## 2019-08-08 ENCOUNTER — Other Ambulatory Visit: Payer: Self-pay

## 2019-08-08 ENCOUNTER — Emergency Department (HOSPITAL_COMMUNITY)
Admission: EM | Admit: 2019-08-08 | Discharge: 2019-08-08 | Disposition: A | Discharge status: Home / Self Care | Payer: Medicare Other | Attending: Emergency Medicine | Admitting: Emergency Medicine

## 2019-08-08 ENCOUNTER — Emergency Department (HOSPITAL_COMMUNITY): Payer: Medicare Other

## 2019-08-08 ENCOUNTER — Encounter (HOSPITAL_COMMUNITY): Payer: Self-pay | Admitting: Emergency Medicine

## 2019-08-08 DIAGNOSIS — I110 Hypertensive heart disease with heart failure: Secondary | ICD-10-CM | POA: Insufficient documentation

## 2019-08-08 DIAGNOSIS — Z87891 Personal history of nicotine dependence: Secondary | ICD-10-CM | POA: Diagnosis not present

## 2019-08-08 DIAGNOSIS — Z853 Personal history of malignant neoplasm of breast: Secondary | ICD-10-CM | POA: Insufficient documentation

## 2019-08-08 DIAGNOSIS — R1084 Generalized abdominal pain: Secondary | ICD-10-CM | POA: Diagnosis not present

## 2019-08-08 DIAGNOSIS — E079 Disorder of thyroid, unspecified: Secondary | ICD-10-CM | POA: Diagnosis not present

## 2019-08-08 DIAGNOSIS — R112 Nausea with vomiting, unspecified: Secondary | ICD-10-CM | POA: Diagnosis not present

## 2019-08-08 DIAGNOSIS — I509 Heart failure, unspecified: Secondary | ICD-10-CM | POA: Diagnosis not present

## 2019-08-08 DIAGNOSIS — Z7901 Long term (current) use of anticoagulants: Secondary | ICD-10-CM | POA: Diagnosis not present

## 2019-08-08 DIAGNOSIS — Z79899 Other long term (current) drug therapy: Secondary | ICD-10-CM | POA: Diagnosis not present

## 2019-08-08 DIAGNOSIS — Z91040 Latex allergy status: Secondary | ICD-10-CM | POA: Diagnosis not present

## 2019-08-08 DIAGNOSIS — I2510 Atherosclerotic heart disease of native coronary artery without angina pectoris: Secondary | ICD-10-CM | POA: Diagnosis not present

## 2019-08-08 DIAGNOSIS — Z951 Presence of aortocoronary bypass graft: Secondary | ICD-10-CM | POA: Diagnosis not present

## 2019-08-08 LAB — CBC WITH DIFFERENTIAL/PLATELET
Abs Immature Granulocytes: 0.04 10*3/uL (ref 0.00–0.07)
Basophils Absolute: 0.1 10*3/uL (ref 0.0–0.1)
Basophils Relative: 0 %
Eosinophils Absolute: 0 10*3/uL (ref 0.0–0.5)
Eosinophils Relative: 0 %
HCT: 39.5 % (ref 36.0–46.0)
Hemoglobin: 13.5 g/dL (ref 12.0–15.0)
Immature Granulocytes: 0 %
Lymphocytes Relative: 21 %
Lymphs Abs: 2.5 10*3/uL (ref 0.7–4.0)
MCH: 32 pg (ref 26.0–34.0)
MCHC: 34.2 g/dL (ref 30.0–36.0)
MCV: 93.6 fL (ref 80.0–100.0)
Monocytes Absolute: 0.8 10*3/uL (ref 0.1–1.0)
Monocytes Relative: 6 %
Neutro Abs: 8.4 10*3/uL — ABNORMAL HIGH (ref 1.7–7.7)
Neutrophils Relative %: 73 %
Platelets: 238 10*3/uL (ref 150–400)
RBC: 4.22 MIL/uL (ref 3.87–5.11)
RDW: 12 % (ref 11.5–15.5)
WBC: 11.8 10*3/uL — ABNORMAL HIGH (ref 4.0–10.5)
nRBC: 0 % (ref 0.0–0.2)

## 2019-08-08 LAB — COMPREHENSIVE METABOLIC PANEL
ALT: 22 U/L (ref 0–44)
AST: 25 U/L (ref 15–41)
Albumin: 4 g/dL (ref 3.5–5.0)
Alkaline Phosphatase: 61 U/L (ref 38–126)
Anion gap: 13 (ref 5–15)
BUN: 36 mg/dL — ABNORMAL HIGH (ref 8–23)
CO2: 18 mmol/L — ABNORMAL LOW (ref 22–32)
Calcium: 9.9 mg/dL (ref 8.9–10.3)
Chloride: 104 mmol/L (ref 98–111)
Creatinine, Ser: 1.45 mg/dL — ABNORMAL HIGH (ref 0.44–1.00)
GFR calc Af Amer: 44 mL/min — ABNORMAL LOW (ref >60)
GFR calc non Af Amer: 38 mL/min — ABNORMAL LOW (ref >60)
Glucose, Bld: 117 mg/dL — ABNORMAL HIGH (ref 70–99)
Potassium: 3.7 mmol/L (ref 3.5–5.1)
Sodium: 135 mmol/L (ref 135–145)
Total Bilirubin: 1.3 mg/dL — ABNORMAL HIGH (ref 0.3–1.2)
Total Protein: 7.7 g/dL (ref 6.5–8.1)

## 2019-08-08 LAB — ETHANOL: Alcohol, Ethyl (B): <10 mg/dL (ref <10)

## 2019-08-08 LAB — LIPASE, BLOOD: Lipase: 45 U/L (ref 11–51)

## 2019-08-08 MED ORDER — DICYCLOMINE HCL 20 MG PO TABS: 20.0000 mg | ORAL_TABLET | Freq: Two times a day (BID) | ORAL | 0 refills | Status: DC

## 2019-08-08 MED ORDER — PANTOPRAZOLE SODIUM 40 MG IV SOLR
40.0000 mg | Freq: Once | INTRAVENOUS | Status: AC
  Administered 2019-08-08: 40 mg via INTRAVENOUS
  Filled 2019-08-08: qty 40

## 2019-08-08 MED ORDER — MORPHINE SULFATE (PF) 4 MG/ML IV SOLN
4.0000 mg | Freq: Once | INTRAVENOUS | Status: AC
  Administered 2019-08-08: 4 mg via INTRAVENOUS
  Filled 2019-08-08: qty 1

## 2019-08-08 MED ORDER — SODIUM CHLORIDE 0.9 % IV BOLUS
1000.0000 mL | Freq: Once | INTRAVENOUS | Status: AC
  Administered 2019-08-08: 1000 mL via INTRAVENOUS

## 2019-08-08 MED ORDER — ONDANSETRON HCL 4 MG/2ML IJ SOLN
4.0000 mg | Freq: Once | INTRAMUSCULAR | Status: AC
  Administered 2019-08-08: 4 mg via INTRAVENOUS
  Filled 2019-08-08: qty 2

## 2019-08-08 NOTE — ED Triage Notes (Signed)
Pt in w/complaints of low abdominal pain x 1 wk, states also n/v/d and hx of IBS. Decreased appetite reported, came to ED for same 2 days ago but LWBS d/t wait. Temp 99.3, states she went to her PCP this past week and sat in a chair that a Covid+ had been in, is concerned she has contracted it. C/o gas pressure and pain in midback

## 2019-08-08 NOTE — ED Provider Notes (Signed)
[facility] EMERGENCY DEPARTMENT Provider Note   CSN: [id number] Arrival date & time: 08/08/19  1761     History   Chief Complaint Chief Complaint  Patient presents with  . Abdominal Pain    HPI Brenda [name] is a 63 y.o. female.     HPI  This is a 63 year old female with a history of breast cancer, CHF, coronary artery disease, IBS who presents with nausea, vomiting, abdominal pain.  Patient reports onset of symptoms on Saturday.  She states "I think my IBS flared up."  She reports excessive "gas" as well as diffuse abdominal pain.  She reports that during this time she has had multiple normal bowel movements.  She has developed nonbilious, nonbloody emesis.  Her primary GI doctor is Dr. Loletha Carrow.  She called his office and was scheduled for a colonoscopy next week.  He has not had any fevers, chest pain, shortness of breath.  She denies any urinary symptoms.  She states "I just want to feel better."  Reports her pain at 7 out of 10.  She did attempt to be seen in the ED 2 days ago but got scared when she found out that someone in the waiting room had Covid.  Lab work reviewed from that time.  She had a acute increase in her creatinine likely reflecting dehydration.  Otherwise her lab work-up was largely reassuring.  Past Medical History:  Diagnosis Date  . Alcoholism ([place])   . Anxiety   . Breast cancer, left [facility]) oncologist-- dr Jana Hakim   dx 1999,  DCIS;  s/p  left lumpectomy w/ node dissection's;  completed chemo and radiation 2000:  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, Grade 2, Stage 0 (ER/PR +);  11-07-2018  s/p  bilateral mastectomies  . Carotid artery stenosis    bilateral---  04-27-2000  s/p  left carotid endarterectomy:  last doppler in epic 08-13-2018  right ICA 40-59%,  right ECA >50%,  left ICA 1-39% ,  bilateral subclavians stenotic  . CHF (congestive heart failure) ([place])   . Chronic stable angina ([place])    followed by cardiology  .  Complication of anesthesia   . Coronary artery disease cardiologist-- dr Stanford Breed   06-12-2000  s/p  CABG x5  :  cardiac cath 02-21-2018 --  normal LVF, 80% LM, occluded RCA, 95% Ramus and 100% OM,  Patent seqSVG to  PLA-PDA & LIMA to LAD,  seqSVG to OM and RI occluded  . Depression   . Family history of lung cancer   . History of CVA with residual deficit    short term memory loss per pt  . History of Helicobacter pylori infection 2001  . Hyperlipidemia   . Hypertension   . Hyperthyroidism    dx yrs ago, per pt have taken medication for awhile  . Mild mitral regurgitation    per echo 06/ 2019  . Peripheral vascular disease ([place])    managed by vascular-- dr Donzetta Matters  . PONV (postoperative nausea and vomiting)   . S/P CABG x 5 06-12-2000   by dr Nils Pyle @[facility]     Patient Active Problem List   Diagnosis Date Noted  . Bruising 07/03/2019  . Pancreatic insufficiency 05/30/2019  . Routine general medical examination at a health care facility 05/30/2019  . Flatulence/gas pain/belching 05/02/2019  . Abdominal pain 11/21/2018  . Monoallelic mutation of PALB2 gene 10/25/2018  . Genetic testing 10/25/2018  . Malignant neoplasm of central portion of left breast in  female, estrogen receptor negative ([place]) 10/08/2018  . 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 Family history of lung cancer   . Loss of transverse plantar arch of left foot 07/19/2018  . Diarrhea 04/25/2018  . Hypothyroidism 03/02/2018  . Lesion of pancreas 06/22/2017  . Constipation 06/22/2017  . Peripheral vascular disease ([place]) 06/01/2017  . Left leg pain 05/10/2017  . Allergic rhinitis 02/25/2016  . Low back pain 09/29/2015  . Carpal tunnel syndrome, right 08/20/2015  . Hx of completed stroke 05/21/2015  . Memory impairment 05/21/2015  . Hyperthyroidism 12/29/2008  . HYPERCHOLESTEROLEMIA 12/29/2008  . Anxiety 12/29/2008  . Essential hypertension 12/29/2008  . Coronary atherosclerosis 12/29/2008  .  CAROTID ARTERY STENOSIS 12/29/2008    Past Surgical History:  Procedure Laterality Date  . ABDOMINAL AORTOGRAM W/LOWER EXTREMITY N/A 06/13/2017   Procedure: ABDOMINAL AORTOGRAM W/LOWER EXTREMITY;  Surgeon: Waynetta Sandy, MD;  Location: [facility];  Service: Cardiovascular;  Laterality: N/A;  Bilateral  . ABDOMINAL HYSTERECTOMY  1998  . BREAST LUMPECTOMY WITH AXILLARY LYMPH NODE DISSECTION Left 1999  . CARDIAC CATHETERIZATION  04-24-2000  dr Caryl Comes   3V CAD , subtotal PDA w/ slow flow colleterals , ef 48%  . CARDIAC CATHETERIZATION  06/11/2000   3V CAD with progression of LAD disease  . CAROTID ENDARTERECTOMY Left 04-27-2000   dr Amedeo Plenty  @[facility]   . CORONARY ARTERY BYPASS GRAFT  06-12-2000   dr Lucianne Lei tright @[facility]    x 5 (left internal mammary artery to left anterior  descending coronary artery  . LEFT HEART CATH AND CORS/GRAFTS ANGIOGRAPHY N/A 02/21/2018   Procedure: LEFT HEART CATH AND CORS/GRAFTS ANGIOGRAPHY;  Surgeon: Lorretta Harp, MD;  Location: [facility];  Service: Cardiovascular;  Laterality: N/A;  . PERIPHERAL VASCULAR INTERVENTION  06/13/2017   Procedure: PERIPHERAL VASCULAR INTERVENTION;  Surgeon: Waynetta Sandy, MD;  Location: [facility];  Service: Cardiovascular;;  Bilateral Iliac Stents  . ROBOTIC ASSISTED SALPINGO OOPHERECTOMY Bilateral 03/13/2019   Procedure: XI ROBOTIC ASSISTED BILATERAL  SALPINGO OOPHORECTOMY;  Surgeon: Everitt Amber, MD;  Location[facility];  Service: Gynecology;  Laterality: Bilateral;  . TOTAL MASTECTOMY Bilateral 11/07/2018   Procedure: BILATERAL MASTECTOMIES;  Surgeon: Stark Klein, MD;  Location: [facility];  Service: General;  Laterality: Bilateral;     OB History   No obstetric history on file.      Home Medications    Prior to Admission medications   Medication Sig Start Date End Date Taking? Authorizing Provider  amLODipine (NORVASC) 5 MG tablet Take 1 tablet (5 mg total)  by mouth daily. 05/12/19   Lelon Perla, MD  aspirin 81 MG tablet Take 1 tablet (81 mg total) by mouth daily. With Food 03/02/18   Roxan Hockey, MD  Carboxymethylcellulose Sodium (REFRESH PLUS OP) Place 1-2 drops into both eyes as needed (for dryness or irritation only when wearing contacts).    [provider]  Cholecalciferol (VITAMIN D-3) 1000 units CAPS Take 2,000 Units by mouth daily.    [provider]  clopidogrel (PLAVIX) 75 MG tablet Take 75 mg by mouth daily.    [provider]  dicyclomine (BENTYL) 10 MG capsule Take 1 capsule (10 mg total) by mouth 2 times daily at 12 noon and 4 pm. 05/02/19   Zehr, Janett Billow D, PA-C  hydrOXYzine (ATARAX/VISTARIL) 25 MG tablet Take 1 tablet (25 mg total) by mouth at bedtime as needed. 05/01/19   Hall-Potvin, [place],  PA-C  hyoscyamine (LEVSIN) 0.125 MG tablet Take 1 tablet (0.125 mg total) by mouth every 4 (four) hours as needed. Patient taking differently: Take 0.125 mg by mouth every 4 (four) hours as needed for bladder spasms or cramping.  02/12/19   [name], MD  ibuprofen (ADVIL) 600 MG tablet Take 1 tablet (600 mg total) by mouth every 6 (six) hours as needed. For AFTER surgery 02/05/19   Joylene John D, NP  isosorbide mononitrate (IMDUR) 60 MG 24 hr tablet Take 1 tablet (60 mg total) by mouth daily. 06/12/19 06/11/20  Lelon Perla, MD  lipase/protease/amylase (CREON) 36000 UNITS CPEP capsule 2 capsules during each meal and 1 capsule during each snack. 05/20/19   Mansouraty, Telford Nab., MD  lisinopril-hydrochlorothiazide (ZESTORETIC) 20-25 MG tablet Take 1 tablet by mouth once daily 07/01/19   Lelon Perla, MD  metoprolol succinate (TOPROL-XL) 100 MG 24 hr tablet Take 1 tablet (100 mg total) by mouth daily. Take with or immediately following a meal. 06/13/18   Crenshaw, Denice Bors, MD  montelukast (SINGULAIR) 10 MG tablet Take 1 tablet (10 mg total) by mouth at bedtime. 07/30/18   [name], MD   Multiple Vitamins-Calcium (ONE-A-DAY WOMENS PO) Take 1 tablet by mouth daily.    [provider]  nitroGLYCERIN (NITROSTAT) 0.4 MG SL tablet Place 1 tablet (0.4 mg total) under the tongue every 5 (five) minutes as needed for chest pain. 1 tablet under tongue every 5 minutes as needed for chest discomfort (max 2 Tab/day) 02/24/19   Kroeger, Lorelee Cover., PA-C  ondansetron (ZOFRAN) 4 MG tablet Take 1 tablet (4 mg total) by mouth every 8 (eight) hours as needed for nausea or vomiting. 08/06/19   Doran Stabler, MD  pantoprazole (PROTONIX) 40 MG tablet Take 1 tablet (40 mg total) by mouth daily. 05/01/19   Hall-Potvin, [place], PA-C  Potassium Chloride ER 20 MEQ TBCR TAKE 1 TABLET BY MOUTH ON MONDAY,WEDNESDAYS,FRIDAYS AND SUNDAYS 05/30/19   [name], MD  rosuvastatin (CRESTOR) 20 MG tablet Take 20 mg by mouth daily. 02/24/18   [provider]  spironolactone (ALDACTONE) 25 MG tablet Take 1 tablet (25 mg total) by mouth daily. 04/04/19 07/03/19  Lelon Perla, MD  venlafaxine XR (EFFEXOR-XR) 37.5 MG 24 hr capsule TAKE 1 CAPSULE BY MOUTH ONCE DAILY WITH BREAKFAST Patient taking differently: Take 37.5 mg by mouth daily with breakfast.  04/25/18   Lyndal Pulley, DO    Family History Family History  Problem Relation Age of Onset  . Lung cancer Mother 30       died at an early age  . Other Other        There is no Hx of premature coronary artery disease in her family    Social History Social History   Tobacco Use  . Smoking status: Former Smoker    Types: Cigarettes    Quit date: 09/11/2001    Years since quitting: 17.9  . Smokeless tobacco: Never Used  Substance Use Topics  . Alcohol use: No  . Drug use: No     Allergies   Adhesive [tape] and Latex   Review of Systems Review of Systems  Constitutional: Negative for fever.  Respiratory: Negative for shortness of breath.   Cardiovascular: Negative for chest pain.  Gastrointestinal: Positive for  abdominal pain, nausea and vomiting. Negative for constipation and diarrhea.  Genitourinary: Negative for dysuria.  Neurological: Negative for headaches.  All other systems reviewed and are  negative.    Physical Exam Updated Vital Signs BP 91/64   Pulse 62   Resp 13   Wt 64 kg   SpO2 99%   BMI 24.41 kg/m   Physical Exam Vitals signs and nursing note reviewed.  Constitutional:      Appearance: She is well-developed. She is not ill-appearing.     Comments: Tearful  HENT:     Head: Normocephalic and atraumatic.  Eyes:     Pupils: Pupils are equal, round, and reactive to light.  Neck:     Musculoskeletal: Neck supple.  Cardiovascular:     Rate and Rhythm: Normal rate and regular rhythm.     Heart sounds: Normal heart sounds.  Pulmonary:     Effort: Pulmonary effort is normal. No respiratory distress.     Breath sounds: No wheezing.  Chest:    Abdominal:     General: Bowel sounds are normal.     Palpations: Abdomen is soft.     Tenderness: There is abdominal tenderness. There is no guarding or rebound.  Musculoskeletal:     Right lower leg: No edema.     Left lower leg: No edema.  Skin:    General: Skin is warm and dry.  Neurological:     Mental Status: She is alert and oriented to person, place, and time.  Psychiatric:        Mood and Affect: Mood normal.      ED Treatments / Results  Labs (all labs ordered are listed, but only abnormal results are displayed) Labs Reviewed  CBC WITH DIFFERENTIAL/PLATELET - Abnormal; Notable for the following components:      Result Value   WBC 11.8 (*)    Neutro Abs 8.4 (*)    All other components within normal limits  COMPREHENSIVE METABOLIC PANEL - Abnormal; Notable for the following components:   CO2 18 (*)    Glucose, Bld 117 (*)    BUN 36 (*)    Creatinine, Ser 1.45 (*)    Total Bilirubin 1.3 (*)    GFR calc non Af Amer 38 (*)    GFR calc Af Amer 44 (*)    All other components within normal limits  LIPASE,  BLOOD  URINALYSIS, ROUTINE W REFLEX MICROSCOPIC    EKG None  Radiology No results found.  Procedures Procedures (including critical care time)  Medications Ordered in ED Medications  sodium chloride 0.9 % bolus 1,000 mL (1,000 mLs Intravenous New Bag/Given 08/08/19 0629)  morphine 4 MG/ML injection 4 mg (4 mg Intravenous Given 08/08/19 0629)  ondansetron (ZOFRAN) injection 4 mg (4 mg Intravenous Given 08/08/19 0240)     Initial Impression / Assessment and Plan / ED Course  I have reviewed the triage vital signs and the nursing notes.  Pertinent labs & imaging results that were available during my care of the patient were reviewed by me and considered in my medical decision making (see chart for details).        Patient presents with abdominal pain, nausea, vomiting.  History of IBS.  I have reviewed her prior lab work which showed little bit of a leukocytosis and AKI.  Patient was given fluids, pain and nausea medication.  She is tender on exam but no signs of peritonitis.  Lab work today is essentially the same.  This could be her IBS but it could also be something more like an obstruction.  Will obtain a CT to evaluate for that.  On recheck, patient reports persistent  now worsening epigastric pain that is burning in nature.  She was given IV Protonix.  Patient signed out to oncoming provider.  Final Clinical Impressions(s) / ED Diagnoses   Final diagnoses:  None    ED Discharge Orders    None       , Barbette Hair, MD 08/08/19 0710

## 2019-08-08 NOTE — Discharge Instructions (Addendum)
Use the Bentyl as directed and follow-up with your stomach specialist

## 2019-08-08 NOTE — ED Notes (Signed)
Patient verbalizes understanding of discharge instructions. Opportunity for questioning and answers were provided. Armband removed by staff, pt discharged from ED.  

## 2019-08-08 NOTE — ED Provider Notes (Signed)
Patient signed to me by Dr. Dina Rich and abdominal CT without acute findings.  Abdomen remains nonacute.  Will prescribe Bentyl and return precautions given   Lacretia Leigh, MD 08/08/19 [phone]

## 2019-08-12 ENCOUNTER — Other Ambulatory Visit: Payer: Self-pay | Admitting: *Deleted

## 2019-08-12 ENCOUNTER — Telehealth: Payer: Self-pay | Admitting: Gastroenterology

## 2019-08-12 DIAGNOSIS — Z1159 Encounter for screening for other viral diseases: Secondary | ICD-10-CM

## 2019-08-12 NOTE — Telephone Encounter (Signed)
Thank you for the update - I agree with moving up the procedure date.  I do not have any further medicine to offer for her symptoms at present, as the nature of symptoms is unclear.

## 2019-08-12 NOTE — Telephone Encounter (Signed)
Dr. Loletha Carrow, Juluis Rainier-  Spoke to the patient on speaker phone with [facility] LPN due to the patient's increasing frustration while giving instruction and information concerning dates and times of procedures and future appointments. Due to the patient continued complaints of abd pain and repeated ED visits for evaluation, the patient's endo/colon was moved up from 12/16 with her permission. The patient has been scheduled for the following:    12/3 at 1:00 pm COVID screening (order in Epic)  12/4 at 1:00 pm Pre-visit with nurse (patient reported she is not taking Plavix)  12/7 at 3:30 pm endo/colon with Dr. Loletha Carrow in [facility]  Dates, times and addresses where repeated several times during the conversation. Patient verbalized and parroted information back to this RN and [facility] LPN. All questions answered. Patient encouraged to continue to take dicyclomine and Zofran as prescribed for abd pain and nausea. The patient was also encouraged to stay hydrated since she reports she is not eating much but to eat bland, easily digestible foods. Patient verbalized understanding.

## 2019-08-14 ENCOUNTER — Other Ambulatory Visit (HOSPITAL_COMMUNITY): Payer: Self-pay | Admitting: Cardiology

## 2019-08-14 ENCOUNTER — Ambulatory Visit (INDEPENDENT_AMBULATORY_CARE_PROVIDER_SITE_OTHER): Payer: Medicare Other

## 2019-08-14 ENCOUNTER — Other Ambulatory Visit: Payer: Self-pay

## 2019-08-14 ENCOUNTER — Ambulatory Visit (HOSPITAL_COMMUNITY)
Admission: RE | Admit: 2019-08-14 | Discharge: 2019-08-14 | Disposition: A | Discharge status: Home / Self Care | Payer: Medicare Other | Source: Ambulatory Visit | Attending: Cardiology | Admitting: Cardiology

## 2019-08-14 DIAGNOSIS — Z1159 Encounter for screening for other viral diseases: Secondary | ICD-10-CM

## 2019-08-14 DIAGNOSIS — I6523 Occlusion and stenosis of bilateral carotid arteries: Secondary | ICD-10-CM

## 2019-08-14 DIAGNOSIS — I6529 Occlusion and stenosis of unspecified carotid artery: Secondary | ICD-10-CM | POA: Insufficient documentation

## 2019-08-15 ENCOUNTER — Ambulatory Visit (AMBULATORY_SURGERY_CENTER): Payer: Self-pay | Admitting: *Deleted

## 2019-08-15 ENCOUNTER — Other Ambulatory Visit: Payer: Self-pay

## 2019-08-15 ENCOUNTER — Other Ambulatory Visit: Payer: Self-pay | Admitting: *Deleted

## 2019-08-15 ENCOUNTER — Encounter: Payer: Self-pay | Admitting: *Deleted

## 2019-08-15 VITALS — Ht 63.0 in | Wt 133.2 lb

## 2019-08-15 DIAGNOSIS — I6529 Occlusion and stenosis of unspecified carotid artery: Secondary | ICD-10-CM

## 2019-08-15 DIAGNOSIS — R197 Diarrhea, unspecified: Secondary | ICD-10-CM

## 2019-08-15 DIAGNOSIS — R1030 Lower abdominal pain, unspecified: Secondary | ICD-10-CM

## 2019-08-15 LAB — SARS CORONAVIRUS 2 (TAT 6-24 HRS): SARS Coronavirus 2: NEGATIVE

## 2019-08-15 MED ORDER — SUPREP BOWEL PREP KIT 17.5-3.13-1.6 GM/177ML PO SOLN: 1.0000 | Freq: Once | ORAL | 0 refills | Status: AC

## 2019-08-15 NOTE — Progress Notes (Signed)
Patient denies any allergies to egg or soy products. Patient has PONV with anesthesia.   Patient denies oxygen use at home and denies diet medications. Emmi instructions for colonoscopy/endoscopy explained and given to patient.  Patient's covid test was negative on 08/14/19.

## 2019-08-18 ENCOUNTER — Other Ambulatory Visit: Payer: Self-pay

## 2019-08-18 ENCOUNTER — Encounter: Payer: Self-pay | Admitting: Gastroenterology

## 2019-08-18 ENCOUNTER — Ambulatory Visit (AMBULATORY_SURGERY_CENTER): Payer: Medicare Other | Admitting: Gastroenterology

## 2019-08-18 VITALS — BP 116/44 | HR 61 | Temp 98.6°F | Resp 13 | Ht 63.0 in | Wt 133.2 lb

## 2019-08-18 DIAGNOSIS — K2950 Unspecified chronic gastritis without bleeding: Secondary | ICD-10-CM | POA: Diagnosis not present

## 2019-08-18 DIAGNOSIS — K269 Duodenal ulcer, unspecified as acute or chronic, without hemorrhage or perforation: Secondary | ICD-10-CM

## 2019-08-18 DIAGNOSIS — K5730 Diverticulosis of large intestine without perforation or abscess without bleeding: Secondary | ICD-10-CM

## 2019-08-18 DIAGNOSIS — K529 Noninfective gastroenteritis and colitis, unspecified: Secondary | ICD-10-CM | POA: Diagnosis not present

## 2019-08-18 DIAGNOSIS — R1084 Generalized abdominal pain: Secondary | ICD-10-CM | POA: Diagnosis not present

## 2019-08-18 DIAGNOSIS — K639 Disease of intestine, unspecified: Secondary | ICD-10-CM

## 2019-08-18 DIAGNOSIS — K222 Esophageal obstruction: Secondary | ICD-10-CM | POA: Diagnosis not present

## 2019-08-18 DIAGNOSIS — K2980 Duodenitis without bleeding: Secondary | ICD-10-CM

## 2019-08-18 DIAGNOSIS — K449 Diaphragmatic hernia without obstruction or gangrene: Secondary | ICD-10-CM

## 2019-08-18 MED ORDER — SODIUM CHLORIDE 0.9 % IV SOLN: 500.0000 mL | Freq: Once | INTRAVENOUS | Status: DC

## 2019-08-18 NOTE — Patient Instructions (Signed)
Please read handouts provided. Continue present medications. Stop Aspirin for 6 weeks to allow ulcer healing. Await pathology results.       YOU HAD AN ENDOSCOPIC PROCEDURE TODAY AT [facility]:   Refer to the procedure report that was given to you for any specific questions about what was found during the examination.  If the procedure report does not answer your questions, please call your gastroenterologist to clarify.  If you requested that your care partner not be given the details of your procedure findings, then the procedure report has been included in a sealed envelope for you to review at your convenience later.  YOU SHOULD EXPECT: Some feelings of bloating in the abdomen. Passage of more gas than usual.  Walking can help get rid of the air that was put into your GI tract during the procedure and reduce the bloating. If you had a lower endoscopy (such as a colonoscopy or flexible sigmoidoscopy) you may notice spotting of blood in your stool or on the toilet paper. If you underwent a bowel prep for your procedure, you may not have a normal bowel movement for a few days.  Please Note:  You might notice some irritation and congestion in your nose or some drainage.  This is from the oxygen used during your procedure.  There is no need for concern and it should clear up in a day or so.  SYMPTOMS TO REPORT IMMEDIATELY:   Following lower endoscopy (colonoscopy or flexible sigmoidoscopy):  Excessive amounts of blood in the stool  Significant tenderness or worsening of abdominal pains  Swelling of the abdomen that is new, acute  Fever of 100F or higher   Following upper endoscopy (EGD)  Vomiting of blood or coffee ground material  New chest pain or pain under the shoulder blades  Painful or persistently difficult swallowing  New shortness of breath  Fever of 100F or higher  Black, tarry-looking stools  For urgent or emergent issues, a gastroenterologist can be  reached at any hour by calling [phone].   DIET:  We do recommend a small meal at first, but then you may proceed to your regular diet.  Drink plenty of fluids but you should avoid alcoholic beverages for 24 hours.  ACTIVITY:  You should plan to take it easy for the rest of today and you should NOT DRIVE or use heavy machinery until tomorrow (because of the sedation medicines used during the test).    FOLLOW UP: Our staff will call the number listed on your records 48-72 hours following your procedure to check on you and address any questions or concerns that you may have regarding the information given to you following your procedure. If we do not reach you, we will leave a message.  We will attempt to reach you two times.  During this call, we will ask if you have developed any symptoms of COVID 19. If you develop any symptoms (ie: fever, flu-like symptoms, shortness of breath, cough etc.) before then, please call [phone].  If you test positive for Covid 19 in the 2 weeks post procedure, please call and report this information to [place].    If any biopsies were taken you will be contacted by phone or by letter within the next 1-3 weeks.  Please call us at [phone] if you have not heard about the biopsies in 3 weeks.    SIGNATURES/CONFIDENTIALITY: You and/or your care partner have signed paperwork which will be entered into your  electronic medical record.  These signatures attest to the fact that that the information above on your After Visit Summary has been reviewed and is understood.  Full responsibility of the confidentiality of this discharge information lies with you and/or your care-partner.

## 2019-08-18 NOTE — Progress Notes (Signed)
Temperature taken by L.C., VS taken by C.B.

## 2019-08-18 NOTE — Progress Notes (Signed)
Pt's states no medical or surgical changes since previsit or office visit. 

## 2019-08-18 NOTE — Progress Notes (Signed)
PT taken to PACU. Monitors in place. VSS. Report given to RN. 

## 2019-08-18 NOTE — Progress Notes (Signed)
Called to room to assist during endoscopic procedure.  Patient ID and intended procedure confirmed with present staff. Received instructions for my participation in the procedure from the performing physician.  

## 2019-08-18 NOTE — Op Note (Signed)
[facility] Patient Name: Brenda [name] Procedure Date: 08/18/2019 3:07 PM MRN: [id number] Endoscopist: [employer]. Loletha Carrow , MD Age: 63 Referring MD:  Date of Birth: [birth year] Gender: Female Account #: [id number] Procedure:                Upper GI endoscopy Indications:              Generalized abdominal pain, Abdominal bloating,                            Diarrhea, Nausea with vomiting (see colonoscopy                            report for more clinical details. CTAP with changes                            of chronic pancreatitis, but unclear to what extent                            that contributes to symptoms. Patient intolerant of                            pancreatic enzyme supplement. vascular disease on                            CT scan as well, but no stenosis of SMA) Medicines:                Monitored Anesthesia Care Procedure:                Pre-Anesthesia Assessment:                           - Prior to the procedure, a History and Physical                            was performed, and patient medications and                            allergies were reviewed. The patient's tolerance of                            previous anesthesia was also reviewed. The risks                            and benefits of the procedure and the sedation                            options and risks were discussed with the patient.                            All questions were answered, and informed consent                            was obtained. Prior Anticoagulants: The patient has  taken no previous anticoagulant or antiplatelet                            agents except for aspirin. ASA Grade Assessment:                            III - A patient with severe systemic disease. After                            reviewing the risks and benefits, the patient was                            deemed in satisfactory condition to undergo the   procedure.                           After obtaining informed consent, the endoscope was                            passed under direct vision. Throughout the                            procedure, the patient's blood pressure, pulse, and                            oxygen saturations were monitored continuously. The                            Endoscope was introduced through the mouth, and                            advanced to the second part of duodenum. The upper                            GI endoscopy was accomplished without difficulty.                            The patient tolerated the procedure well. Scope In: Scope Out: Findings:                 The larynx was normal.                           A small hiatal hernia was present.                           One benign-appearing, intrinsic mild stenosis was                            found at the gastroesophageal junction. The                            stenosis was traversed.                           Diffuse inflammation characterized by erosions  and                            erythema was found in the gastric antrum. Biopsies                            were taken with a cold forceps for histology.                            (Sidney protocol).                           The cardia and gastric fundus were normal on                            retroflexion.                           Multiple small, non-bleeding superficial duodenal                            ulcers with no stigmata of bleeding were found in                            the entire examined duodenum. The mucosa was also                            atrophic throughout the area. Biopsies were taken                            from the second portion of the duodenum with a cold                            forceps for histology. Complications:            No immediate complications. Estimated Blood Loss:     Estimated blood loss was minimal. Impression:               - Normal  larynx.                           - Small hiatal hernia.                           - Benign-appearing esophageal stenosis.                           - Gastritis. Biopsied.                           - Non-bleeding duodenal ulcers with no stigmata of                            bleeding. Biopsied. Recommendation:           - Patient has a contact number available for  emergencies. The signs and symptoms of potential                            delayed complications were discussed with the                            patient. Return to normal activities tomorrow.                            Written discharge instructions were provided to the                            patient.                           - Resume previous diet.                           - Continue present medications, but stop aspirin                            for 6 weeks to allow ulcer healing and assess for                            any UGI symptom improvement.                           - Await pathology results.                           - See the other procedure note for documentation of                            additional recommendations. Henry L. Loletha Carrow, MD 08/18/2019 4:01:21 PM This report has been signed electronically.

## 2019-08-18 NOTE — Op Note (Signed)
[facility] Patient Name: Brenda [name] Procedure Date: 08/18/2019 3:07 PM MRN: [id number] Endoscopist: [place]. Loletha Carrow , MD Age: 63 Referring MD:  Date of Birth: 04-29-1956 Gender: Female Account #: [id number] Procedure:                Colonoscopy Indications:              Generalized abdominal pain, Chronic (intermittent)                            diarrhea, bloating (UGI symptoms as well. CTAP with                            changes of chronic pancreatitis, though                            contribution to symptoms unclear. Patient                            intolerant of pancreatic enzyme supplement) Medicines:                Monitored Anesthesia Care Procedure:                Pre-Anesthesia Assessment:                           - Prior to the procedure, a History and Physical                            was performed, and patient medications and                            allergies were reviewed. The patient's tolerance of                            previous anesthesia was also reviewed. The risks                            and benefits of the procedure and the sedation                            options and risks were discussed with the patient.                            All questions were answered, and informed consent                            was obtained. Prior Anticoagulants: The patient has                            taken no previous anticoagulant or antiplatelet                            agents except for aspirin. ASA Grade Assessment:  III - A patient with severe systemic disease. After                            reviewing the risks and benefits, the patient was                            deemed in satisfactory condition to undergo the                            procedure.                           After obtaining informed consent, the colonoscope                            was passed under direct vision. Throughout the               procedure, the patient's blood pressure, pulse, and                            oxygen saturations were monitored continuously. The                            Colonoscope was introduced through the anus and                            advanced to the the terminal ileum, with                            identification of the appendiceal orifice and IC                            valve. The colonoscopy was performed without                            difficulty. The patient tolerated the procedure                            well. The quality of the bowel preparation was                            excellent. The terminal ileum, ileocecal valve,                            appendiceal orifice, and rectum were photographed. Scope In: 3:19:03 PM Scope Out: 3:33:46 PM Scope Withdrawal Time: 0 hours 11 minutes 40 seconds  Total Procedure Duration: 0 hours 14 minutes 43 seconds  Findings:                 The perianal and digital rectal examinations were                            normal.  An area of mucosa in the terminal ileum was nodular                            (somewhat more then typical). Biopsies were taken                            with a cold forceps for histology.                           Normal mucosa was found in the entire colon.                            Biopsies for histology were taken with a cold                            forceps from the right colon and left colon for                            evaluation of microscopic colitis.                           A single large-mouthed diverticulum was found in                            the ascending colon.                           The exam was otherwise without abnormality on                            direct and retroflexion views. Complications:            No immediate complications. Estimated Blood Loss:     Estimated blood loss was minimal. Impression:               - Nodular ileal mucosa.  Biopsied. Most likely                            normal TI lymphatic tissue.                           - Normal mucosa in the entire examined colon.                            Biopsied.                           - Diverticulosis in the ascending colon.                           - The examination was otherwise normal on direct                            and retroflexion views. Recommendation:           - Patient has a contact number available for  emergencies. The signs and symptoms of potential                            delayed complications were discussed with the                            patient. Return to normal activities tomorrow.                            Written discharge instructions were provided to the                            patient.                           - Resume previous diet.                           - Continue present medications.                           - Await pathology results.                           - No recommendation at this time regarding repeat                            colonoscopy.                           - See the other procedure note for documentation of                            additional recommendations. Henry L. Loletha Carrow, MD 08/18/2019 3:51:28 PM This report has been signed electronically.

## 2019-08-19 ENCOUNTER — Telehealth: Payer: Self-pay | Admitting: Cardiology

## 2019-08-19 ENCOUNTER — Encounter: Payer: Self-pay | Admitting: *Deleted

## 2019-08-19 NOTE — Telephone Encounter (Signed)
Discontinue lisinopril HCT and hold amlodipine.  Follow blood pressure and we will adjust as needed.  Arrange APP office visit.

## 2019-08-19 NOTE — Telephone Encounter (Signed)
Spoke with pt, Aware of dr Jacalyn Lefevre recommendations. She has a follow up appt 09-04-2019.

## 2019-08-19 NOTE — Telephone Encounter (Signed)
Spoke with pt and has had a bout of  IBS for several days  and has become dehydrated has lost about 5  Lbs Pt did visit ED on 08/08/19 .Per pt checked B/P this am and was 56/45 after taking all of the B/P meds Pt did eat some chicken noodle soup this am and pt checked B/P again and was 81/56 with HR of 68 Per pt fell on Sunday due to weakness Will forward to Dr Stanford Breed for review and recommendations ./cy

## 2019-08-19 NOTE — Telephone Encounter (Signed)
STAT if patient feels like he/she is going to faint   1) Are you dizzy now? no  2) Do you feel faint or have you passed out? no  3) Do you have any other symptoms? fatigue  4) Have you checked your HR and BP (record if available)? BP 56/45  Patient called c/o dizziness when standing too quickly and has fallen about 3 times over the weekend.  She states she is very weak. She also states her BP is 56/45 today. This started a couple weeks ago. She states she has irritable bowel syndrome and has not been able to eat over the last few weeks. She states she gets very bloated and was experiencing vomiting.

## 2019-08-20 ENCOUNTER — Telehealth: Payer: Self-pay

## 2019-08-20 NOTE — Telephone Encounter (Signed)
  Follow up Call-  Call back number 08/18/2019  Post procedure Call Back phone  # [phone]  Permission to leave phone message Yes  Some recent data might be hidden     Patient questions:  Do you have a fever, pain , or abdominal swelling? No. Pain Score  0 *  Have you tolerated food without any problems? Yes.    Have you been able to return to your normal activities? Yes.    Do you have any questions about your discharge instructions: Diet   No. Medications  No. Follow up visit  No.  Do you have questions or concerns about your Care? Yes.  Low  BP, called primary dr, he adjusted medication  Actions: * If pain score is 4 or above: No action needed, pain <4.  1. Have you developed a fever since your procedure? no  2.   Have you had an respiratory symptoms (SOB or cough) since your procedure? no  3.   Have you tested positive for COVID 19 since your procedure no  4.   Have you had any family members/close contacts diagnosed with the COVID 19 since your procedure?  no   If yes to any of these questions please route to Joylene John, RN and Alphonsa Gin, Therapist, sports.

## 2019-08-21 ENCOUNTER — Other Ambulatory Visit: Payer: Self-pay | Admitting: *Deleted

## 2019-08-21 ENCOUNTER — Telehealth: Payer: Self-pay | Admitting: *Deleted

## 2019-08-21 MED ORDER — CEPHALEXIN 250 MG PO CAPS: 250.0000 mg | ORAL_CAPSULE | Freq: Three times a day (TID) | ORAL | 0 refills | Status: AC

## 2019-08-21 MED ORDER — METRONIDAZOLE 250 MG PO TABS: 250.0000 mg | ORAL_TABLET | Freq: Three times a day (TID) | ORAL | 0 refills | Status: AC

## 2019-08-21 NOTE — Telephone Encounter (Signed)
Brenda [name],   This patient on whom I did an upper endoscopy and colonoscopy earlier in the week sent me another message attached below regarding her ongoing bloating and gas.   As a reminder, her CT scan showed changes of chronic pancreatitis, so she was given pancreatic enzyme supplements.  However, she stopped after a few days, saying it made all the symptoms worse.  Sometimes patients with pancreatic problems may have a condition called small intestinal bacterial overgrowth (SIBO).  However testing is not widely available. I think the last thing I have to offer for her reported symptoms, assuming the pending biopsies are normal, is a trial of empiric antibiotics in case she has SIBO.  If she is agreeable, please send prescriptions for the following  Cephalexin 250 mg tablet 1 tablet 3 times daily for 10 days Dispense 30, refill 0  Metronidazole 250 mg tablet 1 tablet 3 times daily for 10 days Dispense 30, refill 0   Her notes seem to indicate concern that her blood pressure was low.  I have copied this to her primary care provider, and recommend that Brenda [name] contact them so blood pressure can be followed closely. _______________________________________________________   Dr. Sharlet Salina,  Please see above on this patient that we recently messaged about.  [facility]

## 2019-08-21 NOTE — Telephone Encounter (Signed)
Spoke to patient. Notified patient of Dr. Loletha Carrow' recommendations and reminded her to f/u with PCP concerning concerns of hypotension.   Prescriptions sent to pharmacy on file, confirmed with patient.

## 2019-08-26 ENCOUNTER — Encounter: Payer: Self-pay | Admitting: Gastroenterology

## 2019-08-27 ENCOUNTER — Encounter: Payer: Medicare Other | Admitting: Gastroenterology

## 2019-09-01 NOTE — Progress Notes (Signed)
HPI: FU coronary artery disease and cerebrovascular disease. Patient is status post coronary artery bypass graft in 2001 (LIMA to the LAD, sequential saphenous vein graft to the distal right coronary and PDA, sequential saphenous vein graft to the ramus and obtuse marginal). She also had carotid endarterectomy at that time as well. Renal Dopplers in January of 2012 showed normal renal arteries and no abdominal aortic aneurysm. Cardiac catheterization June 2019 showed normal LV function, 80% left main, occluded right coronary artery, 95% ramus and 100% obtuse marginal. Sequential saphenous vein graft to the PLA-PDA and LIMA to the LAD patent. Sequential saphenous vein graft to the obtuse marginal and ramus intermedius occluded. Medical therapy recommended. Peripheral vascular disease followed by vascular surgery.    Echocardiogram July 2020 showed normal LV function, mild left ventricular hypertrophy, grade 1 diastolic dysfunction.  ABIs November 2020 normal.  Carotid Dopplers December 2020 showed 40 to 59% right and 1 to 39% left stenosis.  The left subclavian was stenotic and there was right innominate artery stenosis.  Since last seen patient has had difficulties over the last several months with abdominal cramping and decreased p.o. intake.  She is being evaluated by gastroenterology.  Her blood pressure is running low and she has had dizziness at times.  There is no dyspnea or chest pain.  Current Outpatient Medications  Medication Sig Dispense Refill  . amLODipine (NORVASC) 5 MG tablet Take 1 tablet (5 mg total) by mouth daily. 90 tablet 3  . aspirin 81 MG tablet Take 1 tablet (81 mg total) by mouth daily. With Food 30 tablet 2  . Carboxymethylcellulose Sodium (REFRESH PLUS OP) Place 1-2 drops into both eyes as needed (for dryness or irritation only when wearing contacts).    . Cholecalciferol (VITAMIN D-3) 1000 units CAPS Take 2,000 Units by mouth daily.    Marland Kitchen dicyclomine (BENTYL) 20 MG  tablet Take 1 tablet (20 mg total) by mouth 2 (two) times daily. 20 tablet 0  . hydrOXYzine (ATARAX/VISTARIL) 25 MG tablet Take 1 tablet (25 mg total) by mouth at bedtime as needed. 12 tablet 0  . hyoscyamine (LEVSIN) 0.125 MG tablet Take 1 tablet (0.125 mg total) by mouth every 4 (four) hours as needed. (Patient taking differently: Take 0.125 mg by mouth every 4 (four) hours as needed for bladder spasms or cramping. ) 90 tablet 6  . ibuprofen (ADVIL) 600 MG tablet Take 1 tablet (600 mg total) by mouth every 6 (six) hours as needed. For AFTER surgery 30 tablet 0  . isosorbide mononitrate (IMDUR) 60 MG 24 hr tablet Take 1 tablet (60 mg total) by mouth daily. 30 tablet 11  . lipase/protease/amylase (CREON) 36000 UNITS CPEP capsule 2 capsules during each meal and 1 capsule during each snack. (Patient not taking: Reported on 08/15/2019) 250 capsule 3  . metoprolol succinate (TOPROL-XL) 100 MG 24 hr tablet Take 1 tablet (100 mg total) by mouth daily. Take with or immediately following a meal. 90 tablet 3  . montelukast (SINGULAIR) 10 MG tablet Take 1 tablet (10 mg total) by mouth at bedtime. 90 tablet 3  . Multiple Vitamins-Calcium (ONE-A-DAY WOMENS PO) Take 1 tablet by mouth daily.    . nitroGLYCERIN (NITROSTAT) 0.4 MG SL tablet Place 1 tablet (0.4 mg total) under the tongue every 5 (five) minutes as needed for chest pain. 1 tablet under tongue every 5 minutes as needed for chest discomfort (max 2 Tab/day) 25 tablet 4  . ondansetron (ZOFRAN) 4 MG tablet Take  1 tablet (4 mg total) by mouth every 8 (eight) hours as needed for nausea or vomiting. 30 tablet 1  . pantoprazole (PROTONIX) 40 MG tablet Take 1 tablet (40 mg total) by mouth daily. 30 tablet 0  . Potassium Chloride ER 20 MEQ TBCR TAKE 1 TABLET BY MOUTH ON MONDAY,WEDNESDAYS,FRIDAYS AND SUNDAYS 30 tablet 6  . rosuvastatin (CRESTOR) 20 MG tablet Take 20 mg by mouth daily.  1  . spironolactone (ALDACTONE) 25 MG tablet Take 1 tablet (25 mg total) by mouth  daily. 90 tablet 3  . venlafaxine XR (EFFEXOR-XR) 37.5 MG 24 hr capsule TAKE 1 CAPSULE BY MOUTH ONCE DAILY WITH BREAKFAST (Patient taking differently: Take 37.5 mg by mouth daily with breakfast. ) 90 capsule 1   No current facility-administered medications for this visit.     Past Medical History:  Diagnosis Date  . Alcoholism ([place])    Quit 1998  . Allergy   . Anemia   . Anxiety   . 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 Carotid artery stenosis    bilateral---  04-27-2000  s/p  left carotid endarterectomy:  last doppler in epic 08-13-2018  right ICA 40-59%,  right ECA >50%,  left ICA 1-39% ,  bilateral subclavians stenotic  . CHF (congestive heart failure) ([place])   . Chronic stable angina ([place])    followed by cardiology  . Complication of anesthesia    PONV  . Coronary artery disease cardiologist-- dr Stanford Breed   06-12-2000  s/p  CABG x5  :  cardiac cath 02-21-2018 --  normal LVF, 80% LM, occluded RCA, 95% Ramus and 100% OM,  Patent seqSVG to  PLA-PDA & LIMA to LAD,  seqSVG to OM and RI occluded  . Depression   . Family history of lung cancer   . History of blood transfusion   . History of CVA with residual deficit    short term memory loss per pt  . History of Helicobacter pylori infection 2001  . Hyperlipidemia   . Hypertension   . Hyperthyroidism    dx yrs ago, per pt have taken medication for awhile  . Mild mitral regurgitation    per echo 06/ 2019  . Peripheral vascular disease ([place])    managed by vascular-- dr Donzetta Matters  . PONV (postoperative nausea and vomiting)   . S/P CABG x 5 06-12-2000   by dr Lucianne Lei tright @[facility]   . Stroke [facility]) 2002   mild memory loss    Past Surgical History:  Procedure Laterality Date  . ABDOMINAL AORTOGRAM W/LOWER EXTREMITY  N/A 06/13/2017   Procedure: ABDOMINAL AORTOGRAM W/LOWER EXTREMITY;  Surgeon: Waynetta Sandy, MD;  Location: [place] [facility];  Service: Cardiovascular;  Laterality: N/A;  Bilateral  . ABDOMINAL HYSTERECTOMY  1998  . BREAST LUMPECTOMY WITH AXILLARY LYMPH NODE DISSECTION Left 1999  . CARDIAC CATHETERIZATION  04-24-2000  dr Caryl Comes   3V CAD , subtotal PDA w/ slow flow colleterals , ef 48%  . CARDIAC CATHETERIZATION  06/11/2000   3V CAD with progression of LAD disease  . CAROTID ENDARTERECTOMY Left 04-27-2000   dr Amedeo Plenty  @[facility]   . COLONOSCOPY  06/24/2019   [facility]  . CORONARY ARTERY BYPASS GRAFT  06-12-2000   dr Lucianne Lei tright @[facility]   x 5 (left internal mammary artery to left anterior  descending coronary artery  . LEFT HEART CATH AND CORS/GRAFTS ANGIOGRAPHY N/A 02/21/2018   Procedure: LEFT HEART CATH AND CORS/GRAFTS ANGIOGRAPHY;  Surgeon: Lorretta Harp, MD;  Location: [place] [facility];  Service: Cardiovascular;  Laterality: N/A;  . PERIPHERAL VASCULAR INTERVENTION  06/13/2017   Procedure: PERIPHERAL VASCULAR INTERVENTION;  Surgeon: Waynetta Sandy, MD;  Location: [place] [facility];  Service: Cardiovascular;;  Bilateral Iliac Stents  . ROBOTIC ASSISTED SALPINGO OOPHERECTOMY Bilateral 03/13/2019   Procedure: XI ROBOTIC ASSISTED BILATERAL  SALPINGO OOPHORECTOMY;  Surgeon: Everitt Amber, MD;  Location: [facility];  Service: Gynecology;  Laterality: Bilateral;  . TOTAL MASTECTOMY Bilateral 11/07/2018   Procedure: BILATERAL MASTECTOMIES;  Surgeon: Stark Klein, MD;  Location: [place];  Service: General;  Laterality: Bilateral;    Social History   Socioeconomic History  . Marital status: Single    Spouse name: Not on file  . Number of children: 1  . Years of education: Not on file  . Highest education level: Not on file  Occupational History  . 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 Smoking status:  Former Smoker    Years: 17.00    Types: Cigarettes    Quit date: 09/11/2001    Years since quitting: 17.9  . Smokeless tobacco: Never Used  Substance and Sexual Activity  . Alcohol use: Not Currently    Comment: Quit 1998  . Drug use: No  . Sexual activity: Not on file    Comment: Hysterectomy  Other Topics Concern  . Not on file  Social History Narrative  . Not on file   Social Determinants of Health   Financial Resource Strain:   . Difficulty of Paying Living Expenses: Not on file  Food Insecurity:   . Worried About Charity fundraiser in the Last Year: Not on file  . Ran Out of Food in the Last Year: Not on file  Transportation Needs:   . Lack of Transportation (Medical): Not on file  . Lack of Transportation (Non-Medical): Not on file  Physical Activity:   . Days of Exercise per Week: Not on file  . Minutes of Exercise per Session: Not on file  Stress:   . Feeling of Stress : Not on file  Social Connections:   . Frequency of Communication with Friends and Family: Not on file  . Frequency of Social Gatherings with Friends and Family: Not on file  . Attends Religious Services: Not on file  . Active Member of Clubs or Organizations: Not on file  . Attends Archivist Meetings: Not on file  . Marital Status: Not on file  Intimate Partner Violence:   . Fear of Current or Ex-Partner: Not on file  . Emotionally Abused: Not on file  . Physically Abused: Not on file  . Sexually Abused: Not on file    Family History  Problem Relation Age of Onset  . Lung cancer Mother 40       died at an early age  . Other Other        There is no Hx of premature coronary artery disease in her family  . Colon cancer Neg Hx   . Rectal cancer Neg Hx   . Stomach cancer Neg Hx     ROS: Abdominal cramping and decreased p.o. intake but no fevers or chills, productive cough, hemoptysis, dysphasia,  odynophagia, melena, hematochezia, dysuria, hematuria, rash, seizure activity,  orthopnea, PND, pedal edema, claudication. Remaining systems are negative.  Physical Exam: Well-developed well-nourished in no acute distress.  Skin is warm and dry.  HEENT is normal.  Neck is supple.  Chest is clear to auscultation with normal expansion.  Cardiovascular exam is regular rate and rhythm.  Abdominal exam nontender or distended. No masses palpated. Extremities show no edema. neuro grossly intact  ECG-sinus tachycardia at a rate of 101, left atrial enlargement, nonspecific ST changes, prolonged QT.  Personally reviewed  A/P  1 coronary artery disease-patient continues to do well from a symptomatic standpoint with no chest pain.  Plan to continue medical therapy with aspirin and statin.    2 carotid artery disease-patient will need follow-up carotid Dopplers December 2021.  3 hypertension-blood pressure borderline.  She has had problems with abdominal cramping and decreased p.o. intake with significant weight loss.  She also has orthostatic symptoms at times.  We will discontinue lisinopril, amlodipine and spironolactone.  Follow blood pressure and adjust regimen as needed.  Check potassium and renal function.  4 hyperlipidemia-continue statin.  5 abdominal cramping-evaluation ongoing per gastroenterology.  Kirk Ruths, MD

## 2019-09-04 ENCOUNTER — Ambulatory Visit (INDEPENDENT_AMBULATORY_CARE_PROVIDER_SITE_OTHER): Payer: Medicare Other | Admitting: Cardiology

## 2019-09-04 ENCOUNTER — Encounter: Payer: Self-pay | Admitting: Cardiology

## 2019-09-04 ENCOUNTER — Other Ambulatory Visit: Payer: Self-pay

## 2019-09-04 VITALS — BP 90/56 | HR 101 | Temp 97.2°F | Ht 63.0 in | Wt 134.0 lb

## 2019-09-04 DIAGNOSIS — E7800 Pure hypercholesterolemia, unspecified: Secondary | ICD-10-CM

## 2019-09-04 DIAGNOSIS — I25118 Atherosclerotic heart disease of native coronary artery with other forms of angina pectoris: Secondary | ICD-10-CM

## 2019-09-04 DIAGNOSIS — I10 Essential (primary) hypertension: Secondary | ICD-10-CM | POA: Diagnosis not present

## 2019-09-04 NOTE — Patient Instructions (Signed)
Medication Instructions:  STOP AMLODIPINE  STOP LISINOPRIL  STOP SPIRONOLACTONE  *If you need a refill on your cardiac medications before your next appointment, please call your pharmacy*  Lab Work: Your physician recommends that you HAVE LAB WORK TODAY  If you have labs (blood work) drawn today and your tests are completely normal, you will receive your results only by: Marland Kitchen MyChart Message (if you have MyChart) OR . A paper copy in the mail If you have any lab test that is abnormal or we need to change your treatment, we will call you to review the results.  Follow-Up: At [facility], you and your health needs are our priority.  As part of our continuing mission to provide you with exceptional heart care, we have created designated Provider Care Teams.  These Care Teams include your primary Cardiologist (physician) and Advanced Practice Providers (APPs -  Physician Assistants and Nurse Practitioners) who all work together to provide you with the care you need, when you need it.  Your next appointment:   3 month(s)  The format for your next appointment:   In Person  Provider:   Kirk Ruths, MD

## 2019-09-05 LAB — BASIC METABOLIC PANEL
BUN/Creatinine Ratio: 18 (ref 12–28)
BUN: 18 mg/dL (ref 8–27)
CO2: 20 mmol/L (ref 20–29)
Calcium: 9.8 mg/dL (ref 8.7–10.3)
Chloride: 101 mmol/L (ref 96–106)
Creatinine, Ser: 1 mg/dL (ref 0.57–1.00)
GFR calc Af Amer: 68 mL/min/{1.73_m2} (ref >59)
GFR calc non Af Amer: 59 mL/min/{1.73_m2} — ABNORMAL LOW (ref >59)
Glucose: 114 mg/dL — ABNORMAL HIGH (ref 65–99)
Potassium: 4.1 mmol/L (ref 3.5–5.2)
Sodium: 139 mmol/L (ref 134–144)

## 2019-09-09 NOTE — Addendum Note (Signed)
Addended by: Venetia Maxon on: 09/09/2019 09:52 AM   Modules accepted: Orders

## 2019-09-14 IMAGING — MG MM CLIP PLACEMENT
2 series · 2 of 2 positions shown · non-contrast
Comparison: Previous exam(s).

CLINICAL DATA: Post stereotactic guided biopsy suspicious
calcifications in the upper-outer posterior left breast.

EXAM:
DIAGNOSTIC LEFT MAMMOGRAM POST STEREOTACTIC BIOPSY

[L MLO]
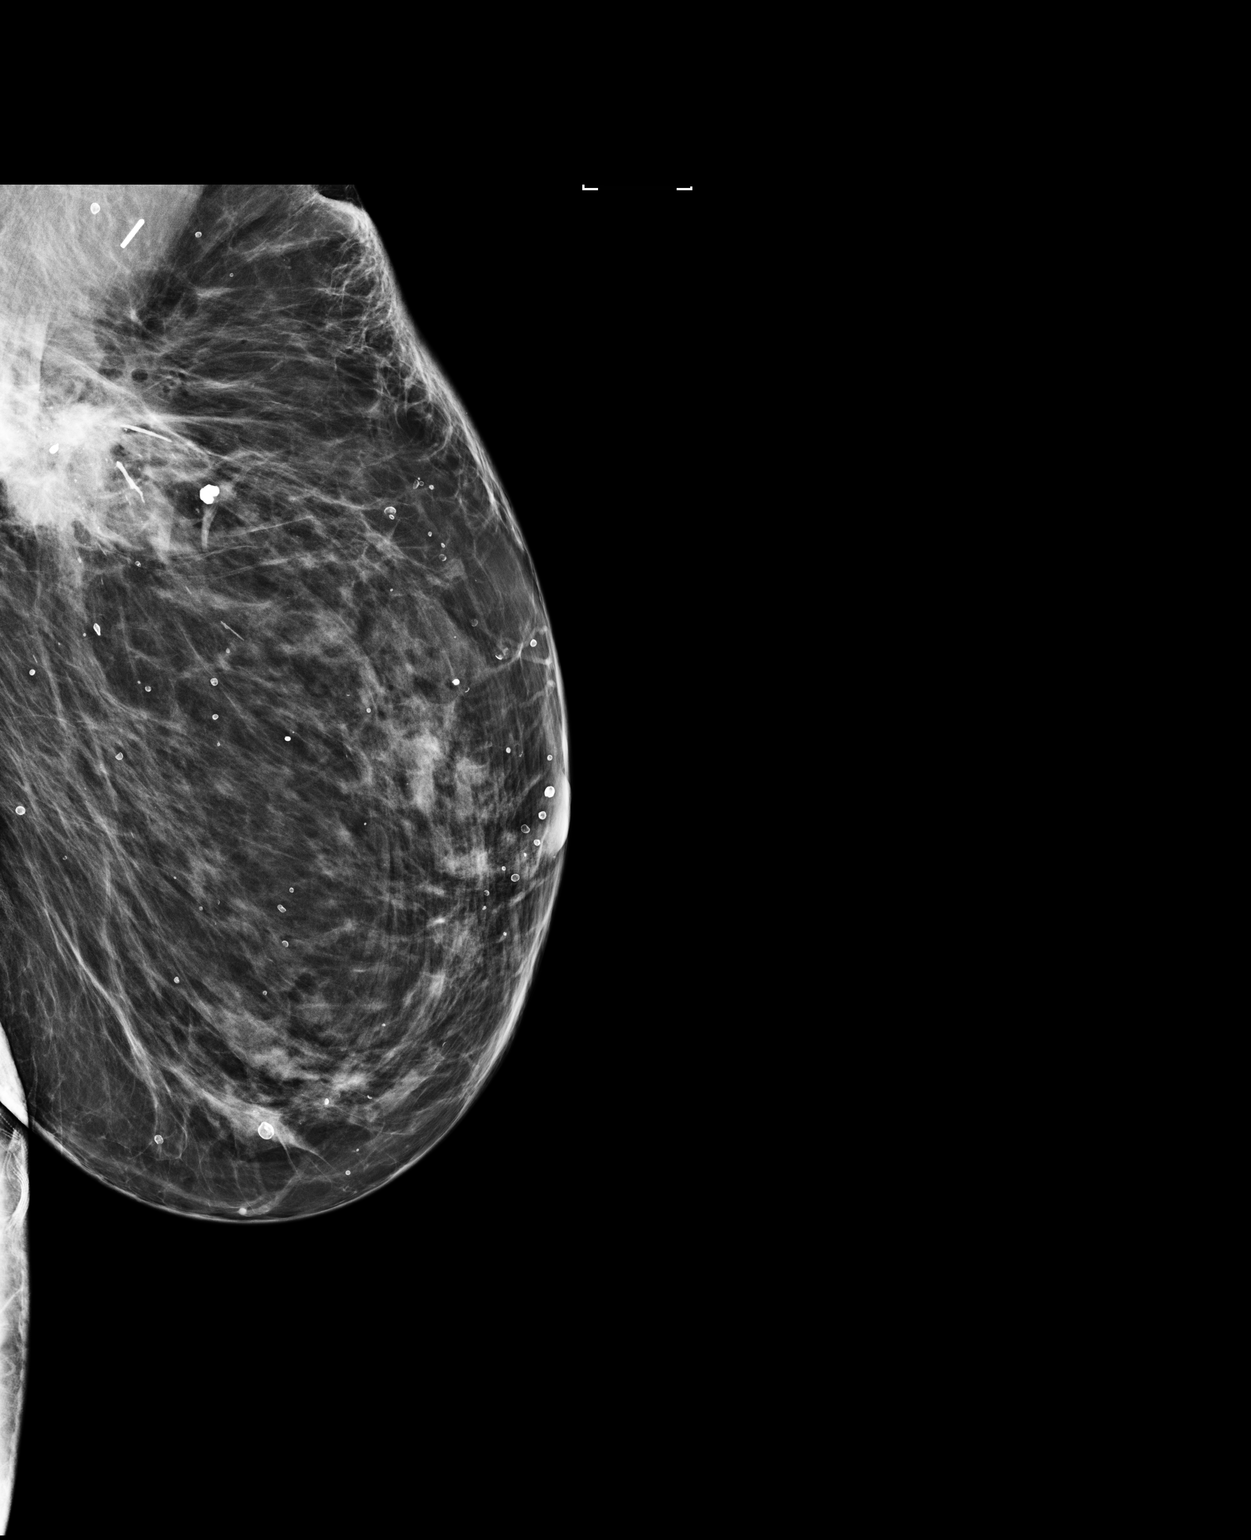

[L XCCL]
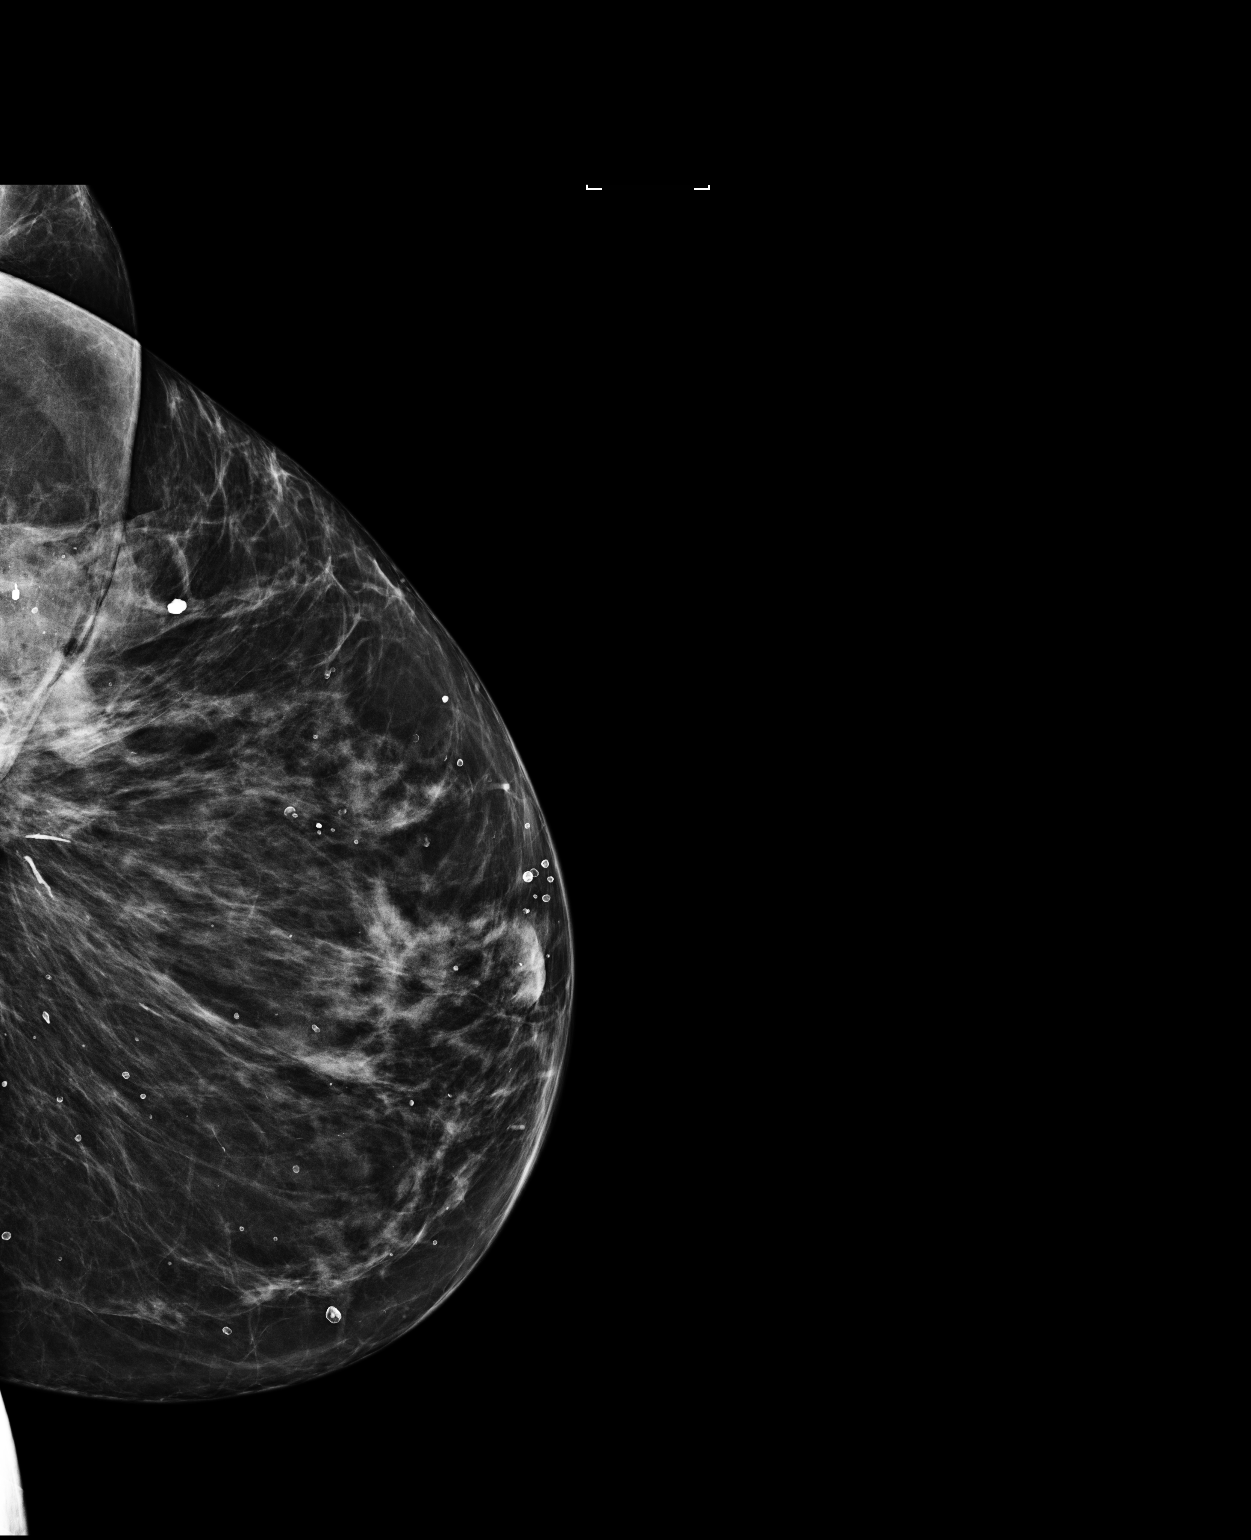

[2 of 2 positions shown; findings below may reference images not displayed]

FINDINGS: Mammographic images were obtained following stereotactic guided
biopsy of calcifications in the upper-outer posterior left breast. A
coil shaped biopsy marking clip is present at the site of the
biopsied calcifications. A small hematoma is present at the biopsy
site.
IMPRESSION: Coil shaped biopsy marking clip at site of biopsied calcifications
in the upper-outer posterior left breast.

Final Assessment: Post Procedure Mammograms for Marker Placement

## 2019-10-03 ENCOUNTER — Other Ambulatory Visit: Payer: Self-pay | Admitting: Cardiology

## 2019-10-03 DIAGNOSIS — R0602 Shortness of breath: Secondary | ICD-10-CM

## 2019-10-03 DIAGNOSIS — I2510 Atherosclerotic heart disease of native coronary artery without angina pectoris: Secondary | ICD-10-CM

## 2019-10-03 DIAGNOSIS — E7800 Pure hypercholesterolemia, unspecified: Secondary | ICD-10-CM

## 2019-10-03 DIAGNOSIS — I10 Essential (primary) hypertension: Secondary | ICD-10-CM

## 2019-10-20 ENCOUNTER — Ambulatory Visit: Payer: Medicare Other

## 2019-11-11 MED ORDER — ROSUVASTATIN CALCIUM 20 MG PO TABS: 20.0000 mg | ORAL_TABLET | Freq: Every day | ORAL | 3 refills | Status: DC

## 2019-11-11 NOTE — Addendum Note (Signed)
Addended by: Cristopher Estimable on: 11/11/2019 07:56 AM   Modules accepted: Orders

## 2019-11-13 NOTE — Progress Notes (Signed)
HPI: FU coronary artery disease and cerebrovascular disease. Patient is status post coronary artery bypass graft in 2001 (LIMA to the LAD, sequential saphenous vein graft to the distal right coronary and PDA, sequential saphenous vein graft to the ramus and obtuse marginal). She also had carotid endarterectomy at that time as well. Renal Dopplers in January of 2012 showed normal renal arteries and no abdominal aortic aneurysm. Cardiac catheterization June 2019 showed normal LV function, 80% left main, occluded right coronary artery, 95% ramus and 100% obtuse marginal. Sequential saphenous vein graft to the PLA-PDA and LIMA to the LAD patent. Sequential saphenous vein graft to the obtuse marginal and ramus intermedius occluded. Medical therapy recommended. Peripheral vascular disease followed by vascular surgery. Echocardiogram July 2020 showed normal LV function, mild left ventricular hypertrophy, grade 1 diastolic dysfunction. ABIs November 2020 normal. Carotid Dopplers December 2020 showed 40 to 59% right and 1 to 39% left stenosis. The left subclavian was stenotic and there was right innominate artery stenosis. Since last seen the patient denies any dyspnea on exertion, orthopnea, PND, pedal edema, palpitations, syncope or chest pain.   Current Outpatient Medications  Medication Sig Dispense Refill  . amLODipine (NORVASC) 10 MG tablet Take 1 tablet (10 mg total) by mouth daily. 90 tablet 3  . aspirin 81 MG tablet Take 1 tablet (81 mg total) by mouth daily. With Food 30 tablet 2  . carboxymethylcellulose (REFRESH PLUS) 0.5 % SOLN Apply to eye.    . Cholecalciferol (VITAMIN D-3) 1000 units CAPS Take 2,000 Units by mouth daily.    Marland Kitchen dicyclomine (BENTYL) 20 MG tablet Take 1 tablet (20 mg total) by mouth 2 (two) times daily. 20 tablet 0  . ergocalciferol (VITAMIN D2) 1.25 MG (50000 UT) capsule ergocalciferol (vitamin D2) 1,250 mcg (50,000 unit) capsule    . metoprolol succinate (TOPROL-XL) 100  MG 24 hr tablet TAKE 1 TABLET BY MOUTH ONCE DAILY. TAKE WITH OR IMMEDIATELY FOLLOWING MEALS 90 tablet 0  . montelukast (SINGULAIR) 10 MG tablet Take 1 tablet (10 mg total) by mouth at bedtime. 90 tablet 3  . Multiple Vitamins-Calcium (ONE-A-DAY WOMENS PO) Take 1 tablet by mouth daily.    . nitroGLYCERIN (NITROSTAT) 0.4 MG SL tablet Place 1 tablet (0.4 mg total) under the tongue every 5 (five) minutes as needed for chest pain. 1 tablet under tongue every 5 minutes as needed for chest discomfort (max 2 Tab/day) 25 tablet 4  . potassium chloride (KLOR-CON) 10 MEQ tablet potassium chloride ER 10 mEq tablet,extended release    . rosuvastatin (CRESTOR) 20 MG tablet Take 1 tablet (20 mg total) by mouth daily. 90 tablet 3   No current facility-administered medications for this visit.     Past Medical History:  Diagnosis Date  . Alcoholism ([place])    Quit 1998  . Allergy   . Anemia   . Anxiety   . 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 Carotid artery stenosis    bilateral---  04-27-2000  s/p  left carotid endarterectomy:  last doppler in epic 08-13-2018  right ICA 40-59%,  right ECA >50%,  left ICA 1-39% ,  bilateral subclavians stenotic  . CHF (congestive heart failure) ([place])   . Chronic stable angina ([facility])  followed by cardiology  . Complication of anesthesia    PONV  . Coronary artery disease cardiologist-- dr Stanford Breed   06-12-2000  s/p  CABG x5  :  cardiac cath 02-21-2018 --  normal LVF, 80% LM, occluded RCA, 95% Ramus and 100% OM,  Patent seqSVG to  PLA-PDA & LIMA to LAD,  seqSVG to OM and RI occluded  . Depression   . Family history of lung cancer   . History of blood transfusion   . History of CVA with residual deficit    short term memory loss per pt  .  History of Helicobacter pylori infection 2001  . Hyperlipidemia   . Hypertension   . Hyperthyroidism    dx yrs ago, per pt have taken medication for awhile  . Mild mitral regurgitation    per echo 06/ 2019  . Peripheral vascular disease ([place])    managed by vascular-- dr Donzetta Matters  . PONV (postoperative nausea and vomiting)   . S/P CABG x 5 06-12-2000   by dr Lucianne Lei tright @[facility]   . Stroke [facility]) 2002   mild memory loss    Past Surgical History:  Procedure Laterality Date  . ABDOMINAL AORTOGRAM W/LOWER EXTREMITY N/A 06/13/2017   Procedure: ABDOMINAL AORTOGRAM W/LOWER EXTREMITY;  Surgeon: Waynetta Sandy, MD;  Location: [place] [facility];  Service: Cardiovascular;  Laterality: N/A;  Bilateral  . ABDOMINAL HYSTERECTOMY  1998  . BREAST LUMPECTOMY WITH AXILLARY LYMPH NODE DISSECTION Left 1999  . CARDIAC CATHETERIZATION  04-24-2000  dr Caryl Comes   3V CAD , subtotal PDA w/ slow flow colleterals , ef 48%  . CARDIAC CATHETERIZATION  06/11/2000   3V CAD with progression of LAD disease  . CAROTID ENDARTERECTOMY Left 04-27-2000   dr Amedeo Plenty  @[facility]   . COLONOSCOPY  06/24/2019   [facility]  . CORONARY ARTERY BYPASS GRAFT  06-12-2000   dr Lucianne Lei tright @[facility]    x 5 (left internal mammary artery to left anterior  descending coronary artery  . LEFT HEART CATH AND CORS/GRAFTS ANGIOGRAPHY N/A 02/21/2018   Procedure: LEFT HEART CATH AND CORS/GRAFTS ANGIOGRAPHY;  Surgeon: Lorretta Harp, MD;  Location: [place] [facility];  Service: Cardiovascular;  Laterality: N/A;  . PERIPHERAL VASCULAR INTERVENTION  06/13/2017   Procedure: PERIPHERAL VASCULAR INTERVENTION;  Surgeon: Waynetta Sandy, MD;  Location: [place] [facility];  Service: Cardiovascular;;  Bilateral Iliac Stents  . ROBOTIC ASSISTED SALPINGO OOPHERECTOMY Bilateral 03/13/2019   Procedure: XI ROBOTIC ASSISTED BILATERAL  SALPINGO OOPHORECTOMY;  Surgeon: Everitt Amber, MD;  Location: [facility];  Service: Gynecology;  Laterality: Bilateral;  .  TOTAL MASTECTOMY Bilateral 11/07/2018   Procedure: BILATERAL MASTECTOMIES;  Surgeon: Stark Klein, MD;  Location: [place];  Service: General;  Laterality: Bilateral;    Social History   Socioeconomic History  . Marital status: Single    Spouse name: Not on file  . Number of children: 1  . Years of education: Not on file  . Highest education level: Not on file  Occupational History  . 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 Smoking status: Former Smoker    Years: 17.00    Types: Cigarettes    Quit date: 09/11/2001    Years since quitting: 18.2  . Smokeless tobacco: Never Used  Substance and Sexual Activity  . Alcohol use: Not Currently    Comment: Quit 1998  . Drug use: No  . Sexual activity: Not on file  Comment: Hysterectomy  Other Topics Concern  . Not on file  Social History Narrative  . Not on file   Social Determinants of Health   Financial Resource Strain:   . Difficulty of Paying Living Expenses:   Food Insecurity:   . Worried About Charity fundraiser in the Last Year:   . Arboriculturist in the Last Year:   Transportation Needs:   . Film/video editor (Medical):   Marland Kitchen Lack of Transportation (Non-Medical):   Physical Activity:   . Days of Exercise per Week:   . Minutes of Exercise per Session:   Stress:   . Feeling of Stress :   Social Connections:   . Frequency of Communication with Friends and Family:   . Frequency of Social Gatherings with Friends and Family:   . Attends Religious Services:   . Active Member of Clubs or Organizations:   . Attends Archivist Meetings:   Marland Kitchen Marital Status:   Intimate Partner Violence:   . Fear of Current or Ex-Partner:   . Emotionally Abused:   Marland Kitchen Physically Abused:   . Sexually Abused:     Family History  Problem Relation Age of Onset  . Lung cancer Mother 74       died at an early age  . Other Other        There is no Hx of premature  coronary artery disease in her family  . Colon cancer Neg Hx   . Rectal cancer Neg Hx   . Stomach cancer Neg Hx     ROS: Problems with memory but no fevers or chills, productive cough, hemoptysis, dysphasia, odynophagia, melena, hematochezia, dysuria, hematuria, rash, seizure activity, orthopnea, PND, pedal edema, claudication. Remaining systems are negative.  Physical Exam: Well-developed well-nourished in no acute distress.  Skin is warm and dry.  HEENT is normal.  Neck is supple.  Chest is clear to auscultation with normal expansion.  Cardiovascular exam is regular rate and rhythm.  Abdominal exam nontender or distended. No masses palpated. Extremities show no edema. neuro grossly intact   A/P  1 coronary artery disease-patient denies recurrent chest pain.  Continue medical therapy with aspirin and statin.  2 hypertension-blood pressure controlled.  Continue present medications.  Note we discontinued her lisinopril, amlodipine and spironolactone at last office visit as she was having symptoms of orthostasis.  She had lost significant weight recently which was likely the cause.  Amlodipine was resumed as her blood pressure increased.  Continue to follow.  3 carotid artery disease-plan follow-up carotid Dopplers December 2021.  4 hyperlipidemia-continue statin.  Kirk Ruths, MD

## 2019-11-17 MED ORDER — ROSUVASTATIN CALCIUM 20 MG PO TABS: 20.0000 mg | ORAL_TABLET | Freq: Every day | ORAL | 3 refills | Status: DC

## 2019-11-17 MED ORDER — AMLODIPINE BESYLATE 10 MG PO TABS: 10.0000 mg | ORAL_TABLET | Freq: Every day | ORAL | 3 refills | Status: DC

## 2019-11-17 NOTE — Addendum Note (Signed)
Addended by: Cristopher Estimable on: 11/17/2019 11:24 AM   Modules accepted: Orders

## 2019-11-17 NOTE — Telephone Encounter (Signed)
Spoke with pt, she reports taking amlodipine 5 mg once daily and aware to increase to 10 mg. New script sent to the pharmacy

## 2019-11-17 NOTE — Telephone Encounter (Signed)
Increase amlodipine to 10 mg daily and follow blood pressure.  Kirk Ruths

## 2019-11-19 ENCOUNTER — Other Ambulatory Visit: Payer: Self-pay | Admitting: Cardiology

## 2019-11-19 DIAGNOSIS — R0602 Shortness of breath: Secondary | ICD-10-CM

## 2019-11-19 DIAGNOSIS — I2510 Atherosclerotic heart disease of native coronary artery without angina pectoris: Secondary | ICD-10-CM

## 2019-11-19 DIAGNOSIS — I10 Essential (primary) hypertension: Secondary | ICD-10-CM

## 2019-11-19 DIAGNOSIS — E7800 Pure hypercholesterolemia, unspecified: Secondary | ICD-10-CM

## 2019-11-20 ENCOUNTER — Encounter: Payer: Self-pay | Admitting: Cardiology

## 2019-11-20 ENCOUNTER — Ambulatory Visit (INDEPENDENT_AMBULATORY_CARE_PROVIDER_SITE_OTHER): Payer: Medicare Other | Admitting: Cardiology

## 2019-11-20 ENCOUNTER — Other Ambulatory Visit: Payer: Self-pay

## 2019-11-20 VITALS — BP 120/72 | HR 68 | Temp 97.7°F | Ht 65.0 in | Wt 141.0 lb

## 2019-11-20 DIAGNOSIS — E7800 Pure hypercholesterolemia, unspecified: Secondary | ICD-10-CM | POA: Diagnosis not present

## 2019-11-20 DIAGNOSIS — I6529 Occlusion and stenosis of unspecified carotid artery: Secondary | ICD-10-CM

## 2019-11-20 DIAGNOSIS — I10 Essential (primary) hypertension: Secondary | ICD-10-CM

## 2019-11-20 DIAGNOSIS — I25118 Atherosclerotic heart disease of native coronary artery with other forms of angina pectoris: Secondary | ICD-10-CM | POA: Diagnosis not present

## 2019-11-20 NOTE — Patient Instructions (Signed)

## 2019-12-17 ENCOUNTER — Ambulatory Visit (INDEPENDENT_AMBULATORY_CARE_PROVIDER_SITE_OTHER): Payer: Medicare Other | Admitting: Internal Medicine

## 2019-12-17 ENCOUNTER — Encounter: Payer: Self-pay | Admitting: Internal Medicine

## 2019-12-17 ENCOUNTER — Other Ambulatory Visit: Payer: Self-pay

## 2019-12-17 VITALS — BP 114/68 | HR 58 | Temp 98.3°F | Ht 65.0 in | Wt 141.4 lb

## 2019-12-17 DIAGNOSIS — F4323 Adjustment disorder with mixed anxiety and depressed mood: Secondary | ICD-10-CM | POA: Diagnosis not present

## 2019-12-17 DIAGNOSIS — F4321 Adjustment disorder with depressed mood: Secondary | ICD-10-CM

## 2019-12-17 NOTE — Progress Notes (Signed)
   Subjective:   Patient ID: Brenda [name], female    DOB: 1956/09/07, 64 y.o.   MRN: [id number]  HPI The patient is a 64 YO female coming in for depression symptoms. Some crying spontaneously. She is open to counseling and would like to pursue this. She does have support network but her daughter is not always able to listen well and so she would like an objective listener. She denies SI/HI. Has been having a hard time with pandemic and just life as well. She is the support for many in her life and does not get that same support always.   Review of Systems  Constitutional: Negative.   HENT: Negative.   Eyes: Negative.   Respiratory: Negative for cough, chest tightness and shortness of breath.   Cardiovascular: Negative for chest pain, palpitations and leg swelling.  Gastrointestinal: Negative for abdominal distention, abdominal pain, constipation, diarrhea, nausea and vomiting.  Musculoskeletal: Negative.   Skin: Negative.   Neurological: Negative.   Psychiatric/Behavioral: Positive for dysphoric mood.    Objective:  Physical Exam Constitutional:      Appearance: She is well-developed.  HENT:     Head: Normocephalic and atraumatic.  Cardiovascular:     Rate and Rhythm: Normal rate and regular rhythm.  Pulmonary:     Effort: Pulmonary effort is normal. No respiratory distress.     Breath sounds: Normal breath sounds. No wheezing or rales.  Abdominal:     General: Bowel sounds are normal. There is no distension.     Palpations: Abdomen is soft.     Tenderness: There is no abdominal tenderness. There is no rebound.  Musculoskeletal:     Cervical back: Normal range of motion.  Skin:    General: Skin is warm and dry.  Neurological:     Mental Status: She is alert and oriented to person, place, and time.     Coordination: Coordination normal.     Vitals:   12/17/19 0939  BP: 114/68  Pulse: (!) 58  Temp: 98.3 F (36.8 C)  SpO2: 98%  Weight: 141 lb 6.4 oz (64.1 kg)    Height: 5\' 5"  (1.651 m)    This visit occurred during the SARS-CoV-2 public health emergency.  Safety protocols were in place, including screening questions prior to the visit, additional usage of staff PPE, and extensive cleaning of exam room while observing appropriate contact time as indicated for disinfecting solutions.   Assessment & Plan:

## 2019-12-17 NOTE — Patient Instructions (Signed)
We will get you in with the counselor.

## 2019-12-20 DIAGNOSIS — F4320 Adjustment disorder, unspecified: Secondary | ICD-10-CM | POA: Insufficient documentation

## 2019-12-20 NOTE — Assessment & Plan Note (Signed)
Would like to start with counseling and we talked about medications as an option if this is not helping enough and she will reach out to [place] about how she is doing.

## 2019-12-23 ENCOUNTER — Encounter: Payer: Self-pay | Admitting: Internal Medicine

## 2019-12-31 ENCOUNTER — Ambulatory Visit (INDEPENDENT_AMBULATORY_CARE_PROVIDER_SITE_OTHER): Payer: Medicare Other | Admitting: Psychologist

## 2019-12-31 DIAGNOSIS — F330 Major depressive disorder, recurrent, mild: Secondary | ICD-10-CM

## 2019-12-31 DIAGNOSIS — Z634 Disappearance and death of family member: Secondary | ICD-10-CM | POA: Diagnosis not present

## 2020-01-13 ENCOUNTER — Ambulatory Visit (INDEPENDENT_AMBULATORY_CARE_PROVIDER_SITE_OTHER): Payer: Medicare Other | Admitting: Psychologist

## 2020-01-13 DIAGNOSIS — Z634 Disappearance and death of family member: Secondary | ICD-10-CM | POA: Diagnosis not present

## 2020-01-13 DIAGNOSIS — F330 Major depressive disorder, recurrent, mild: Secondary | ICD-10-CM

## 2020-01-23 ENCOUNTER — Ambulatory Visit: Payer: Medicare Other | Admitting: Psychologist

## 2020-01-23 ENCOUNTER — Ambulatory Visit (INDEPENDENT_AMBULATORY_CARE_PROVIDER_SITE_OTHER): Payer: Medicare Other | Admitting: Psychologist

## 2020-01-23 DIAGNOSIS — F330 Major depressive disorder, recurrent, mild: Secondary | ICD-10-CM

## 2020-01-23 DIAGNOSIS — Z634 Disappearance and death of family member: Secondary | ICD-10-CM

## 2020-02-02 ENCOUNTER — Telehealth: Payer: Self-pay | Admitting: Internal Medicine

## 2020-02-02 DIAGNOSIS — I2510 Atherosclerotic heart disease of native coronary artery without angina pectoris: Secondary | ICD-10-CM

## 2020-02-02 NOTE — Progress Notes (Signed)
  Chronic Care Management   Outreach Note  02/02/2020 Name: Brenda [name] MRN: [id number] DOB: [birth year]  Referred by: [name], MD Reason for referral : Chronic Care Management (INITIAL CCM OUTREACH)    Chronic Care Management   Note  02/02/2020 Name: Brenda [name] MRN: [id number] DOB: [birth year]  [name] is a 64 y.o. year old female who is a primary care patient of [name], MD. I reached out to [name] by phone today in response to a referral sent by [name] PCP, [name], MD.   [name] was given information about Chronic Care Management services today including:  1. CCM service includes personalized support from designated clinical staff supervised by her physician, including individualized plan of care and coordination with other care providers 2. 24/7 contact phone numbers for assistance for urgent and routine care needs. 3. Service will only be billed when office clinical staff spend 20 minutes or more in a month to coordinate care. 4. Only one practitioner may furnish and bill the service in a calendar month. 5. The patient may stop CCM services at any time (effective at the end of the month) by phone call to the office staff.   Patient agreed to services and verbal consent obtained.   This note is not being shared with the patient for the following reason: To respect privacy (The patient or proxy has requested that the information not be shared). Follow up plan:   Earney [facility] Scheduler  Follow Up Plan:   SIGNATURE

## 2020-02-10 ENCOUNTER — Ambulatory Visit (INDEPENDENT_AMBULATORY_CARE_PROVIDER_SITE_OTHER): Payer: Medicare Other | Admitting: Psychologist

## 2020-02-10 DIAGNOSIS — Z634 Disappearance and death of family member: Secondary | ICD-10-CM

## 2020-02-10 DIAGNOSIS — F330 Major depressive disorder, recurrent, mild: Secondary | ICD-10-CM

## 2020-02-26 ENCOUNTER — Ambulatory Visit: Payer: Medicare Other | Admitting: Psychologist

## 2020-03-09 ENCOUNTER — Encounter: Payer: Self-pay | Admitting: Family Medicine

## 2020-03-09 ENCOUNTER — Other Ambulatory Visit: Payer: Self-pay

## 2020-03-09 ENCOUNTER — Ambulatory Visit (INDEPENDENT_AMBULATORY_CARE_PROVIDER_SITE_OTHER): Payer: Medicare Other | Admitting: Family Medicine

## 2020-03-09 VITALS — BP 140/70 | HR 71 | Ht 65.0 in | Wt 150.0 lb

## 2020-03-09 DIAGNOSIS — M5416 Radiculopathy, lumbar region: Secondary | ICD-10-CM

## 2020-03-09 MED ORDER — GABAPENTIN 300 MG PO CAPS: 300.0000 mg | ORAL_CAPSULE | Freq: Three times a day (TID) | ORAL | 3 refills | Status: DC | PRN

## 2020-03-09 NOTE — Progress Notes (Signed)
   Rito Ehrlich, am serving as a Education administrator for Dr. Lynne Leader.  Brenda [name] is a 64 y.o. female who presents to [facility] today for B leg numbness/tingling.  She was last seen by Dr. Tamala Julian on 07/03/19 for B thigh bruising.  Today, she notes numbness/tingling in her B LEs started yesterday with being constant. Locates pain to lateral lower leg pain and bilateral feet tingling and warmness.  No weakness or new bowel bladder dysfunction.  Radiating pain: up to the R upper thigh  B LE weakness: no  Aggravating factors:  Treatments tried: ibuprofen   Diagnostic imaging: L-spine and pelvis XR- 12/19/17  Pertinent review of systems: No fevers or chills  Relevant historical information: Peripheral arterial disease with stents and pancreatic insufficiency.   Exam:  BP 140/70 (BP Location: Left Arm, Patient Position: Sitting, Cuff Size: Normal)   Pulse 71   Ht 5\' 5"  (1.651 m)   Wt 150 lb (68 kg)   LMP  (LMP Unknown)   SpO2 97%   BMI 24.96 kg/m  General: Well Developed, well nourished, and in no acute distress.   MSK: L-spine: Normal-appearing nontender normal motion. Lower extremity strength reflexes and sensation are intact distally.    Lab and Radiology Results CT abdomen and pelvis on August 08, 2019 images personally and independently reviewed. 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 female with bilateral lower extremity pain and paresthesia mostly at L5 dermatomal pattern.  She does have degenerative changes at that level which could explain her symptoms.  Fortune no weakness or numbness however symptoms been ongoing for months now.  Plan for MRI lumbar spine to further characterize cause of pain and trial of gabapentin as needed.  Recheck following MRI.   PDMP not reviewed this encounter. Orders Placed This Encounter  Procedures  . MR Lumbar Spine Wo Contrast    Epic ORDER U[employer] NO COVID PT HAS BEEN  VACCINATED WT:150 HT:5'4 NO NEEDS/ CLAUS PT REQUESTED OPEN UNIT/ NO METAL REMOVED/ PT HAS STENTS IN HER GROIN/ NO GLUCOSE MONITOR, SPINAL STIMULATOR/ PT HAD OPEN HEART SX UNKNOWN IF THERE ARE ANY HEART STENTS/ NO BRAIN, EYE, OR EAR SX/ NO PREV SX TO LUMBAR SPINE DP AND PT  ORDER CHECKED DP 03-09-2020     Standing Status:   Future    Standing Expiration Date:   03/09/2021    Order Specific Question:   What is the patient's sedation requirement?    Answer:   No Sedation    Order Specific Question:   Does the patient have a pacemaker or implanted devices?    Answer:   No    Order Specific Question:   Preferred imaging location?    Answer:   [facility] (table limit-550lbs)    Order Specific Question:   Radiology Contrast Protocol - do NOT remove file path    Answer:   [email or web address]   Meds ordered this encounter  Medications  . gabapentin (NEURONTIN) 300 MG capsule    Sig: Take 1 capsule (300 mg total) by mouth 3 (three) times daily as needed (nerve pain).    Dispense:  90 capsule    Refill:  3     Discussed warning signs or symptoms. Please see discharge instructions. Patient expresses understanding.   The above documentation has been reviewed and is accurate and complete Lynne Leader, M.D.

## 2020-03-09 NOTE — Patient Instructions (Signed)
Thank you for coming in today. Plan for MRI.  Use gabapentin as needed for nerve pain mostly at bedtime.  Recheck followup.    Paresthesia Paresthesia is an abnormal burning or prickling sensation. It is usually felt in the hands, arms, legs, or feet. However, it may occur in any part of the body. Usually, paresthesia is not painful. It may feel like:  Tingling or numbness.  Buzzing.  Itching. Paresthesia may occur without any clear cause, or it may be caused by:  Breathing too quickly (hyperventilation).  Pressure on a nerve.  An underlying medical condition.  Side effects of a medication.  Nutritional deficiencies.  Exposure to toxic chemicals. Most people experience temporary (transient) paresthesia at some time in their lives. For some people, it may be long-lasting (chronic) because of an underlying medical condition. If you have paresthesia that lasts a long time, you may need to be evaluated by your health care provider. Follow these instructions at home: Alcohol use   Do not drink alcohol if: ? Your health care provider tells you not to drink. ? You are pregnant, may be pregnant, or are planning to become pregnant.  If you drink alcohol: ? Limit how much you use to:  0-1 drink a day for women.  0-2 drinks a day for men. ? Be aware of how much alcohol is in your drink. In the [place] one drink equals one 12 oz bottle of beer (355 mL), one 5 oz glass of wine (148 mL), or one 1 oz glass of hard liquor (44 mL). Nutrition   Eat a healthy diet. This includes: ? Eating foods that are high in fiber, such as fresh fruits and vegetables, whole grains, and beans. ? Limiting foods that are high in fat and processed sugars, such as fried or sweet foods. General instructions  Take over-the-counter and prescription medicines only as told by your health care provider.  Do not use any products that contain nicotine or tobacco, such as cigarettes and e-cigarettes. These can  keep blood from reaching damaged nerves. If you need help quitting, ask your health care provider.  If you have diabetes, work closely with your health care provider to keep your blood sugar under control.  If you have numbness in your feet: ? Check every day for signs of injury or infection. Watch for redness, warmth, and swelling. ? Wear padded socks and comfortable shoes. These help protect your feet.  Keep all follow-up visits as told by your health care provider. This is important. Contact a health care provider if you:  Have paresthesia that gets worse or does not go away.  Have a burning or prickling feeling that gets worse when you walk.  Have pain, cramps, or dizziness.  Develop a rash. Get help right away if you:  Feel weak.  Have trouble walking or moving.  Have problems with speech, understanding, or vision.  Feel confused.  Cannot control your bladder or bowel movements.  Have numbness after an injury.  Develop new weakness in an arm or leg.  Faint. Summary  Paresthesia is an abnormal burning or prickling sensation that is usually felt in the hands, arms, legs, or feet. It may also occur in other parts of the body.  Paresthesia may occur without any clear cause, or it may be caused by breathing too quickly (hyperventilation), pressure on a nerve, an underlying medical condition, side effects of a medication, nutritional deficiencies, or exposure to toxic chemicals.  If you have paresthesia that lasts  a long time, you may need to be evaluated by your health care provider. This information is not intended to replace advice given to you by your health care provider. Make sure you discuss any questions you have with your health care provider. Document Revised: 09/23/2018 Document Reviewed: 09/06/2017 Elsevier Patient Education  2020 [employer].

## 2020-03-18 ENCOUNTER — Ambulatory Visit (INDEPENDENT_AMBULATORY_CARE_PROVIDER_SITE_OTHER): Payer: Medicare Other | Admitting: Psychologist

## 2020-03-18 DIAGNOSIS — Z634 Disappearance and death of family member: Secondary | ICD-10-CM

## 2020-03-18 DIAGNOSIS — F330 Major depressive disorder, recurrent, mild: Secondary | ICD-10-CM | POA: Diagnosis not present

## 2020-04-12 ENCOUNTER — Ambulatory Visit
Admission: RE | Admit: 2020-04-12 | Discharge: 2020-04-12 | Disposition: A | Discharge status: Home / Self Care | Payer: Medicare Other | Source: Ambulatory Visit | Attending: Family Medicine | Admitting: Family Medicine

## 2020-04-12 ENCOUNTER — Ambulatory Visit: Payer: Medicare Other | Admitting: Pharmacist

## 2020-04-12 ENCOUNTER — Other Ambulatory Visit: Payer: Self-pay

## 2020-04-12 DIAGNOSIS — I739 Peripheral vascular disease, unspecified: Secondary | ICD-10-CM

## 2020-04-12 DIAGNOSIS — M5416 Radiculopathy, lumbar region: Secondary | ICD-10-CM

## 2020-04-12 DIAGNOSIS — E7800 Pure hypercholesterolemia, unspecified: Secondary | ICD-10-CM

## 2020-04-12 DIAGNOSIS — I10 Essential (primary) hypertension: Secondary | ICD-10-CM

## 2020-04-12 NOTE — Patient Instructions (Addendum)
Visit Information  Phone number for Pharmacist: [phone]  Thank you for meeting with me to discuss your medications! I look forward to working with you to achieve your health care goals. Below is a summary of what we talked about during the visit:  Goals Addressed            This Visit's Progress   . Pharmacy Care Plan       CARE PLAN ENTRY (see longitudinal plan of care for additional care plan information)  Current Barriers:  . Chronic Disease Management support, education, and care coordination needs related to Hypertension, Hyperlipidemia, and Atherosclerosis   Hypertension BP Readings from Last 3 Encounters:  03/09/20 140/70  12/17/19 114/68  11/20/19 120/72 .  Pharmacist Clinical Goal(s): o Over the next 90 days, patient will work with PharmD and providers to achieve BP goal <130/80 . Current regimen:  o Amlodipine 10 mg daily o Metoprolol succinate 100 mg daily . Interventions: o Discussed BP goals and benefits of medication for prevention of heart attack / stroke . Patient self care activities - Over the next 90 days, patient will: o Check BP as needed, document, and provide at future appointments o Ensure daily salt intake < 2300 mg/day  Hyperlipidemia / ASCVD Lab Results  Component Value Date/Time   LDLCALC 57 05/28/2019 11:13 AM   LDLDIRECT 151.5 04/03/2013 08:32 AM .  Pharmacist Clinical Goal(s): o Over the next 90 days, patient will work with PharmD and providers to maintain LDL goal < 70 . Current regimen:  o Rosuvastatin 20 mg daily o Nitroglycerin 0.4 mg SL prn o Aspirin 81 mg daily . Interventions: o Discussed cholesterol goals and benefits of medication for prevention of heart attack / stroke . Patient self care activities - Over the next 90 days, patient will: o Continue medication as prescribed o Continue low cholesterol diet and exercise routine  Medication management . Pharmacist Clinical Goal(s): o Over the next 90 days, patient will  work with PharmD and providers to achieve optimal medication adherence . Current pharmacy: [facility] . Interventions o Comprehensive medication review performed. o Utilize [facility] pharmacy for medication synchronization, packaging and delivery . Patient self care activities - Over the next 90 days, patient will: o Focus on medication adherence by pill pack o Take medications as prescribed o Report any questions or concerns to PharmD and/or provider(s)  Initial goal documentation       Ms. Ainley was given information about Chronic Care Management services today including:  1. CCM service includes personalized support from designated clinical staff supervised by her physician, including individualized plan of care and coordination with other care providers 2. 24/7 contact phone numbers for assistance for urgent and routine care needs. 3. Standard insurance, coinsurance, copays and deductibles apply for chronic care management only during months in which we provide at least 20 minutes of these services. Most insurances cover these services at 100%, however patients may be responsible for any copay, coinsurance and/or deductible if applicable. This service may help you avoid the need for more expensive face-to-face services. 4. Only one practitioner may furnish and bill the service in a calendar month. 5. The patient may stop CCM services at any time (effective at the end of the month) by phone call to the office staff.  Patient agreed to services and verbal consent obtained.   The patient verbalized understanding of instructions provided today and agreed to receive a mailed copy of patient instruction and/or educational materials. Telephone follow up appointment with  pharmacy team member scheduled for: 3 months  Charlene Brooke, PharmD Clinical Pharmacist [facility] After Age 34 After age 37, you are at a higher risk for  certain long-term diseases and infections as well as injuries from falls. Falls are a major cause of broken bones and head injuries in people who are older than age 19. Getting regular preventive care can help to keep you healthy and well. Preventive care includes getting regular testing and making lifestyle changes as recommended by your health care provider. Talk with your health care provider about:  Which screenings and tests you should have. A screening is a test that checks for a disease when you have no symptoms.  A diet and exercise plan that is right for you. What should I know about screenings and tests to prevent falls? Screening and testing are the best ways to find a health problem early. Early diagnosis and treatment give you the best chance of managing medical conditions that are common after age 67. Certain conditions and lifestyle choices may make you more likely to have a fall. Your health care provider may recommend:  Regular vision checks. Poor vision and conditions such as cataracts can make you more likely to have a fall. If you wear glasses, make sure to get your prescription updated if your vision changes.  Medicine review. Work with your health care provider to regularly review all of the medicines you are taking, including over-the-counter medicines. Ask your health care provider about any side effects that may make you more likely to have a fall. Tell your health care provider if any medicines that you take make you feel dizzy or sleepy.  Osteoporosis screening. Osteoporosis is a condition that causes the bones to get weaker. This can make the bones weak and cause them to break more easily.  Blood pressure screening. Blood pressure changes and medicines to control blood pressure can make you feel dizzy.  Strength and balance checks. Your health care provider may recommend certain tests to check your strength and balance while standing, walking, or changing  positions.  Foot health exam. Foot pain and numbness, as well as not wearing proper footwear, can make you more likely to have a fall.  Depression screening. You may be more likely to have a fall if you have a fear of falling, feel emotionally low, or feel unable to do activities that you used to do.  Alcohol use screening. Using too much alcohol can affect your balance and may make you more likely to have a fall. What actions can I take to lower my risk of falls? General instructions  Talk with your health care provider about your risks for falling. Tell your health care provider if: ? You fall. Be sure to tell your health care provider about all falls, even ones that seem minor. ? You feel dizzy, sleepy, or off-balance.  Take over-the-counter and prescription medicines only as told by your health care provider. These include any supplements.  Eat a healthy diet and maintain a healthy weight. A healthy diet includes low-fat dairy products, low-fat (lean) meats, and fiber from whole grains, beans, and lots of fruits and vegetables. Home safety  Remove any tripping hazards, such as rugs, cords, and clutter.  Install safety equipment such as grab bars in bathrooms and safety rails on stairs.  Keep rooms and walkways well-lit. Activity   Follow a regular exercise program to stay fit. This will help you maintain  your balance. Ask your health care provider what types of exercise are appropriate for you.  If you need a cane or walker, use it as recommended by your health care provider.  Wear supportive shoes that have nonskid soles. Lifestyle  Do not drink alcohol if your health care provider tells you not to drink.  If you drink alcohol, limit how much you have: ? 0-1 drink a day for women. ? 0-2 drinks a day for men.  Be aware of how much alcohol is in your drink. In the [place] one drink equals one typical bottle of beer (12 oz), one-half glass of wine (5 oz), or one shot of hard  liquor (1 oz).  Do not use any products that contain nicotine or tobacco, such as cigarettes and e-cigarettes. If you need help quitting, ask your health care provider. Summary  Having a healthy lifestyle and getting preventive care can help to protect your health and wellness after age 103.  Screening and testing are the best way to find a health problem early and help you avoid having a fall. Early diagnosis and treatment give you the best chance for managing medical conditions that are more common for people who are older than age 64.  Falls are a major cause of broken bones and head injuries in people who are older than age 68. Take precautions to prevent a fall at home.  Work with your health care provider to learn what changes you can make to improve your health and wellness and to prevent falls. This information is not intended to replace advice given to you by your health care provider. Make sure you discuss any questions you have with your health care provider. Document Revised: 12/19/2018 Document Reviewed: 07/11/2017 Elsevier Patient Education  2020 [employer].

## 2020-04-12 NOTE — Chronic Care Management (AMB) (Addendum)
Chronic Care Management Pharmacy  Name: Brenda [name]  MRN: [id number] DOB: 1955-11-15   Chief Complaint/ HPI  Brenda [name],  64 y.o. , female presents for their Initial CCM visit with the clinical pharmacist via telephone due to COVID-19 Pandemic.  PCP : [name], MD Patient Care Team: [name], MD as PCP - General (Internal Medicine) Stanford Breed Denice Bors, MD as PCP - Cardiology (Cardiology) Reynold Bowen, MD as Consulting Physician (Endocrinology) Stark Klein, MD as Consulting Physician (General Surgery) Magrinat, Virgie Dad, MD as Consulting Physician (Oncology) Irene Limbo, MD as Consulting Physician (Plastic Surgery) Juanita Craver, MD as Consulting Physician (Gastroenterology) Everitt Amber, MD as Consulting Physician (Gynecologic Oncology) Charlton Haws, [facility] as Pharmacist (Pharmacist)  Their chronic conditions include: Hypertension, Hyperlipidemia, Coronary Artery Disease, Hypothyroidism, Anxiety, Allergic Rhinitis and Hx breast cancer (dx 1999), PVD s/p stent, adjustment disorder, CVA  From [place], her mother died of lung cancer when she was 74, spent some time in foster care. 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 She went to [employer] for college and stayed ever since. Single mother, 1 daughter, 2 granddaughters. Hyperthyroid issues in the late 90s, declined radiation and pursued medical therapy. Breast cancer in 1999 w/ chemo and radiation. CABG in 2001 (likely related to radiation per patient). Retired in 2002. Exercise is "my lifeline" Pt reports a lot of her BP issues in the past stemmed from family stress. Pt reports she is having issues with memory, sometimes forgets to take her meds.  Office Visits: 12/17/19 Dr Sharlet Salina OV: f/u for depression, will start counseling.  Consult Visit: 03/09/20 Dr Georgina Snell (sports med): LE pain and paresthesia w/ degenerative changes, plan MRI lumbar spine, rx gabapentin for nerve  pain.  11/20/19 Dr Stanford Breed (cardiology): CABG 2001, cath 2019, PVD, CVA. Previously stopped lisinopril, amlodipine, spironolactone d/t orthostasis w/ wt loss, restarted amlodipine as BP increased.   Allergies  Allergen Reactions  . Adhesive [Tape] Itching  . Latex Rash   Medications: Outpatient Encounter Medications as of 04/12/2020  Medication Sig  . aspirin 81 MG tablet Take 1 tablet (81 mg total) by mouth daily. With Food  . carboxymethylcellulose (REFRESH PLUS) 0.5 % SOLN Apply to eye.  . Cholecalciferol (VITAMIN D-3) 1000 units CAPS Take 2,000 Units by mouth daily.  Marland Kitchen dicyclomine (BENTYL) 20 MG tablet Take 1 tablet (20 mg total) by mouth 2 (two) times daily.  Marland Kitchen gabapentin (NEURONTIN) 300 MG capsule Take 1 capsule (300 mg total) by mouth 3 (three) times daily as needed (nerve pain).  . metoprolol succinate (TOPROL-XL) 100 MG 24 hr tablet TAKE 1 TABLET BY MOUTH ONCE DAILY. TAKE WITH OR IMMEDIATELY FOLLOWING MEALS  . montelukast (SINGULAIR) 10 MG tablet Take 1 tablet (10 mg total) by mouth at bedtime.  . Multiple Vitamins-Calcium (ONE-A-DAY WOMENS PO) Take 1 tablet by mouth daily.  . nitroGLYCERIN (NITROSTAT) 0.4 MG SL tablet Place 1 tablet (0.4 mg total) under the tongue every 5 (five) minutes as needed for chest pain. 1 tablet under tongue every 5 minutes as needed for chest discomfort (max 2 Tab/day)  . potassium chloride (KLOR-CON) 10 MEQ tablet potassium chloride ER 10 mEq tablet,extended release  . rosuvastatin (CRESTOR) 20 MG tablet Take 1 tablet (20 mg total) by mouth daily.  Marland Kitchen amLODipine (NORVASC) 10 MG tablet Take 1 tablet (10 mg total) by mouth daily.  . ergocalciferol (VITAMIN D2) 1.25 MG (50000 UT) capsule ergocalciferol (vitamin D2) 1,250 mcg (50,000 unit) capsule (Patient not  taking: Reported on 04/12/2020)   No facility-administered encounter medications on file as of 04/12/2020.     Current Diagnosis/Assessment:  SDOH Interventions     Most Recent Value  SDOH  Interventions  Financial Strain Interventions Intervention Not Indicated      Goals Addressed            This Visit's Progress   . Pharmacy Care Plan       CARE PLAN ENTRY (see longitudinal plan of care for additional care plan information)  Current Barriers:  . Chronic Disease Management support, education, and care coordination needs related to Hypertension, Hyperlipidemia, and Coronary Artery Disease   Hypertension BP Readings from Last 3 Encounters:  03/09/20 140/70  12/17/19 114/68  11/20/19 120/72 .  Pharmacist Clinical Goal(s): o Over the next 90 days, patient will work with PharmD and providers to achieve BP goal <130/80 . Current regimen:  o Amlodipine 10 mg daily o Metoprolol succinate 100 mg daily . Interventions: o Discussed BP goals and benefits of medication for prevention of heart attack / stroke . Patient self care activities - Over the next 90 days, patient will: o Check BP as needed, document, and provide at future appointments o Ensure daily salt intake < 2300 mg/day  Hyperlipidemia / CAD Lab Results  Component Value Date/Time   LDLCALC 57 05/28/2019 11:13 AM   LDLDIRECT 151.5 04/03/2013 08:32 AM .  Pharmacist Clinical Goal(s): o Over the next 90 days, patient will work with PharmD and providers to maintain LDL goal < 70 . Current regimen:  o Rosuvastatin 20 mg daily o Nitroglycerin 0.4 mg SL prn o Aspirin 81 mg daily . Interventions: o Discussed cholesterol goals and benefits of medication for prevention of heart attack / stroke . Patient self care activities - Over the next 90 days, patient will: o Continue medication as prescribed o Continue low cholesterol diet and exercise routine  Medication management . Pharmacist Clinical Goal(s): o Over the next 90 days, patient will work with PharmD and providers to achieve optimal medication adherence . Current pharmacy: [facility] . Interventions o Comprehensive medication review  performed. o Utilize [facility] for medication synchronization, packaging and delivery . Patient self care activities - Over the next 90 days, patient will: o Focus on medication adherence by pill pack o Take medications as prescribed o Report any questions or concerns to PharmD and/or provider(s)  Initial goal documentation       Hypertension   BP goal is:  <130/80  Office blood pressures are  BP Readings from Last 3 Encounters:  03/09/20 140/70  12/17/19 114/68  11/20/19 120/72   Kidney Function Lab Results  Component Value Date/Time   CREATININE 1.00 09/04/2019 08:27 AM   CREATININE 1.45 (H) 08/08/2019 05:50 AM   CREATININE 1.14 (H) 10/08/2018 11:09 AM   CREATININE 1.04 (H) 01/31/2017 09:15 AM   CREATININE 1.01 (H) 01/01/2017 02:38 PM   GFR 57.22 (L) 07/03/2019 01:33 PM   GFRNONAA 59 (L) 09/04/2019 08:27 AM   GFRNONAA 51 (L) 10/08/2018 11:09 AM   GFRAA 68 09/04/2019 08:27 AM   GFRAA 59 (L) 10/08/2018 11:09 AM   K 4.1 09/04/2019 08:27 AM   K 3.7 08/08/2019 05:50 AM   Patient checks BP at home when feeling symptomatic Patient home BP readings are ranging: n/a  Patient has failed these meds in the past: n/a Patient is currently controlled on the following medications:  . Amlodipine 10 mg daily . Metoprolol succinate 100 mg daily  We discussed  diet and exercise extensively; walks 2-3 miles per day, yoga, light weight lifting. Pt reports occasional dizziness, otherwise denies side effects. Discussed adequate hydration.  Plan  Continue current medications and control with diet and exercise   Hyperlipidemia / CAD / PVD   LDL goal < 70 CABG 2001. Hx CVA  Lipid Panel     Component Value Date/Time   CHOL 118 05/28/2019 1113   TRIG 90.0 05/28/2019 1113   HDL 42.20 05/28/2019 1113   LDLCALC 57 05/28/2019 1113   LDLDIRECT 151.5 04/03/2013 0832    Hepatic Function Latest Ref Rng & Units 08/08/2019 08/05/2019 07/15/2019  Total Protein 6.5 - 8.1 g/dL 7.7  8.3(H) 7.7  Albumin 3.5 - 5.0 g/dL 4.0 4.5 4.0  AST 15 - 41 U/L 25 24 20   ALT 0 - 44 U/L 22 22 17   Alk Phosphatase 38 - 126 U/L 61 63 67  Total Bilirubin 0.3 - 1.2 mg/dL 1.3(H) 1.8(H) 0.9  Bilirubin, Direct 0.0 - 0.3 mg/dL - - -     The ASCVD Risk score (Popponesset., et al., 2013) failed to calculate for the following reasons:   The valid total cholesterol range is 130 to 320 mg/dL   Patient has failed these meds in past: n/a Patient is currently controlled on the following medications:  . Rosuvastatin 20 mg daily . Aspirin 81 mg daily  Nitroglycerin 0.4 mg SL prn  We discussed:  diet and exercise extensively; cholesterol goals; Pt has not used nitroglycerin in a couple years. Discussed refilling NTG every 6 months to keep in date.  Plan  Continue current medications  Allergic rhinitis   Patient has failed these meds in past: n/a Patient is currently controlled on the following medications:  Marland Kitchen Montelukast 10 mg PRN . OTC allergy relief PRN  We discussed: Pt takes medication for seasonal allergies on as-needed basis  Plan  Continue current medications  Chronic pain   Carpal tunnel syndrome LE pain and paresthesia Lumbar radiculopathy  Patient has failed these meds in past: n/a Patient is currently controlled on the following medications:  . Gabapentin 300 mg   We discussed: Pt has not started taking gabapentin, discussed it is an as-needed medication, recommended to try at bedtime if she needs it.  Plan  Continue current medications  Pancreatic insufficiency   Patient has failed these meds in past: Creon Patient is currently controlled on the following medications:  . Dicyclomine 10 mg BID  We discussed:  Pt is not taking dicyclomine regularly, she reports occasional constipation but otherwise most of her GI issues have resolved.  Plan  Continue current medications   Health Maintenance   Patient is currently controlled on the following medications:   Marland Kitchen Vitamin D3 2000 IU daily . Multivitamin . Potassium chloride 20 mEq - MWFSu . Refresh eye drops PRN . Fiber gummies  We discussed:  Patient is satisfied with current OTC regimen and denies issues  Plan  Continue current medications  Medication Management   Pt uses [facility] for all medications Uses pill box? Yes Pt endorses 90% compliance  We discussed: Verbal consent obtained for [facility] enhanced pharmacy services (medication synchronization, adherence packaging, delivery coordination). A medication sync plan was created to allow patient to get all medications delivered once every 30 to 90 days per patient preference. Patient understands they have freedom to choose pharmacy and clinical pharmacist will coordinate care between all prescribers and [facility].    Plan  Utilize [facility] for medication  synchronization, packaging and delivery   Follow up: 3 month phone visit  Charlene Brooke, PharmD, BCACP Clinical Pharmacist [facility] at [facility] [phone]

## 2020-04-13 ENCOUNTER — Telehealth: Payer: Self-pay

## 2020-04-13 ENCOUNTER — Other Ambulatory Visit: Payer: Self-pay | Admitting: Cardiology

## 2020-04-13 MED ORDER — MONTELUKAST SODIUM 10 MG PO TABS: 10.0000 mg | ORAL_TABLET | Freq: Every day | ORAL | 1 refills | Status: DC

## 2020-04-13 MED ORDER — POTASSIUM CHLORIDE ER 20 MEQ PO TBCR: 20.0000 meq | EXTENDED_RELEASE_TABLET | ORAL | 2 refills | Status: DC

## 2020-04-13 NOTE — Telephone Encounter (Signed)
*  STAT* If patient is at the pharmacy, call can be transferred to refill team.   1. Which medications need to be refilled? (please list name of each medication and dose if known) metoprolol succinate (TOPROL-XL) 100 MG 24 hr tablet  2. Which pharmacy/location (including street and city if local pharmacy) is medication to be sent to? [facility] - [place], [place] - [place] [address]  3. Do they need a 30 day or 90 day supply? 90 day  Patient is out of medication

## 2020-04-13 NOTE — Progress Notes (Signed)
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

## 2020-04-13 NOTE — Telephone Encounter (Signed)
-----   Message from [place], [facility] sent at 04/13/2020  9:27 AM EDT ----- Regarding: Med refills This patient is switching to [facility] for med delivery, can you order her refills there?  Potassium chloride 20 mEq Montelukast 10 mg

## 2020-04-14 ENCOUNTER — Other Ambulatory Visit: Payer: Self-pay

## 2020-04-14 DIAGNOSIS — E7800 Pure hypercholesterolemia, unspecified: Secondary | ICD-10-CM

## 2020-04-14 DIAGNOSIS — I2510 Atherosclerotic heart disease of native coronary artery without angina pectoris: Secondary | ICD-10-CM

## 2020-04-14 DIAGNOSIS — R0602 Shortness of breath: Secondary | ICD-10-CM

## 2020-04-14 DIAGNOSIS — I10 Essential (primary) hypertension: Secondary | ICD-10-CM

## 2020-04-14 MED ORDER — NITROGLYCERIN 0.4 MG SL SUBL: 0.4000 mg | SUBLINGUAL_TABLET | SUBLINGUAL | 4 refills | Status: DC | PRN

## 2020-04-14 MED ORDER — AMLODIPINE BESYLATE 10 MG PO TABS: 10.0000 mg | ORAL_TABLET | Freq: Every day | ORAL | 3 refills | Status: DC

## 2020-04-14 MED ORDER — METOPROLOL SUCCINATE ER 100 MG PO TB24: ORAL_TABLET | ORAL | 0 refills | Status: DC

## 2020-04-14 MED ORDER — ROSUVASTATIN CALCIUM 20 MG PO TABS: 20.0000 mg | ORAL_TABLET | Freq: Every day | ORAL | 3 refills | Status: DC

## 2020-04-14 NOTE — Telephone Encounter (Signed)
Refill for Metoprolol sent to pharmacy. ?

## 2020-04-15 ENCOUNTER — Encounter: Payer: Self-pay | Admitting: Family Medicine

## 2020-04-15 ENCOUNTER — Ambulatory Visit (INDEPENDENT_AMBULATORY_CARE_PROVIDER_SITE_OTHER): Payer: Medicare Other | Admitting: Family Medicine

## 2020-04-15 VITALS — BP 102/74 | HR 58 | Ht 65.0 in | Wt 150.0 lb

## 2020-04-15 DIAGNOSIS — R202 Paresthesia of skin: Secondary | ICD-10-CM | POA: Diagnosis not present

## 2020-04-15 DIAGNOSIS — M545 Low back pain, unspecified: Secondary | ICD-10-CM

## 2020-04-15 DIAGNOSIS — G8929 Other chronic pain: Secondary | ICD-10-CM | POA: Diagnosis not present

## 2020-04-15 NOTE — Patient Instructions (Signed)
Thank you for coming in today. Ok to advance activity and exercise.  If worsening we can do more.  Recheck as needed.

## 2020-04-15 NOTE — Progress Notes (Signed)
I, Wendy Poet, LAT, ATC, am serving as scribe for Dr. Lynne Leader.  Brenda [name] is a 64 y.o. female who presents to [place] at [facility] today for f/u of B leg numbness/tingling and L-spine MRI review.  She was last seen by Dr. Georgina Snell on 03/09/20 and was prescribed Gabapentin and referred for an L-spine MRI.  Since her last visit, pt reports that everything is about the same.  She con't to feel heat and tightness in her feet but is not having an issue w/ the LE numbness/tingling.  Overall she think she is doing pretty well with less back and leg pain now.  Diagnostic testing: L-spine MRI- 04/12/20; L-spine XR- 12/19/17   Pertinent review of systems: No fevers or chills  Relevant historical information: Peripheral vascular disease, carotid artery disease.   Exam:  BP 102/74 (BP Location: Right Arm, Patient Position: Sitting, Cuff Size: Normal)   Pulse (!) 58   Ht 5\' 5"  (1.651 m)   Wt 150 lb (68 kg)   LMP  (LMP Unknown)   SpO2 99%   BMI 24.96 kg/m  General: Well Developed, well nourished, and in no acute distress.   MSK: L-spine normal-appearing normal motion.  Normal gait.    Lab and Radiology Results No results found for this or any previous visit (from the past 72 hour(s)). MR Lumbar Spine Wo Contrast  Result Date: 04/12/2020 CLINICAL DATA:  Paresthesias of feet EXAM: MRI LUMBAR SPINE WITHOUT CONTRAST TECHNIQUE: Multiplanar, multisequence MR imaging of the lumbar spine was performed. No intravenous contrast was administered. COMPARISON:  March 18, 2017 FINDINGS: Segmentation:  Standard. Alignment: Stable mild retrolisthesis at L3-L4 and anterolisthesis at L4-L5. Vertebrae: Stable vertebral body heights. Slightly increased degenerative endplate irregularity at L5-S1. There are primarily chronic appearing degenerative endplate marrow changes at this level with superimposed minor edema. Conus medullaris and cauda equina: Conus extends to the L2 level. Conus and  cauda equina appear normal. Paraspinal and other soft tissues: Right upper pole renal atrophy. Otherwise unremarkable. Disc levels: L1-L2:  No canal or foraminal stenosis. L2-L3:  Disc bulge.  No canal or foraminal stenosis. L3-L4:  Disc bulge.  No canal or foraminal stenosis. L4-L5: Anterolisthesis with uncovering of disc bulge. Moderate right and marked left facet arthropathy. No canal or foraminal stenosis. L5-S1: Disc bulge. Subarticular protrusions have regressed. Mild facet arthropathy. No canal or foraminal stenosis. IMPRESSION: Degenerative changes as detailed above without high-grade stenosis. Facet arthropathy is greatest at L4-L5. Electronically Signed   By: Macy Mis M.D.   On: 04/12/2020 14:28   I, Lynne Leader, personally (independently) visualized and performed the interpretation of the images attached in this note.    Assessment and Plan: 64 y.o. female with back pain and foot and leg tingling.    No evidence of radiculopathy on recent lumbar MRI.  Patient had a normal nerve conduction study in 2018 also.  Possibility of her symptoms are bothersome enough it may be worthwhile repeating the nerve conduction study but when asked she says her problems are not bad enough to warrant it.  Recommend continue exercise recheck back as needed.  Back pain: More mild now.  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  Certainly could proceed with epidural steroid injection if needed in the future however again she has her symptoms are not bad enough to warrant it.  Plan to continue her exercise program and recheck back as needed.    Discussed warning signs or symptoms. Please see  discharge instructions. Patient expresses understanding.   The above documentation has been reviewed and is accurate and complete Lynne Leader, M.D.

## 2020-04-15 NOTE — Addendum Note (Signed)
Addended by: Karle Barr on: 04/15/2020 04:23 PM   Modules accepted: Orders

## 2020-04-29 NOTE — Progress Notes (Signed)
HPI: FU coronary artery disease and cerebrovascular disease. Patient is status post coronary artery bypass graft in 2001 (LIMA to the LAD, sequential saphenous vein graft to the distal right coronary and PDA, sequential saphenous vein graft to the ramus and obtuse marginal). She also had carotid endarterectomy at that time as well. Renal Dopplers in January of 2012 showed normal renal arteries and no abdominal aortic aneurysm. Cardiac catheterization June 2019 showed normal LV function, 80% left main, occluded right coronary artery, 95% ramus and 100% obtuse marginal. Sequential saphenous vein graft to the PLA-PDA and LIMA to the LAD patent. Sequential saphenous vein graft to the obtuse marginal and ramus intermedius occluded. Medical therapy recommended. Peripheral vascular disease followed by vascular surgery. Echocardiogram July 2020 showed normal LV function, mild left ventricular hypertrophy, grade 1 diastolic dysfunction. ABIs November 2020 normal. Carotid Dopplers December 2020 showed 40 to 59% right and 1 to 39% left stenosis. The left subclavian was stenotic and there was right innominate artery stenosis. Since last seenthere is no dyspnea, chest pain, palpitations or syncope.  Current Outpatient Medications  Medication Sig Dispense Refill  . amLODipine (NORVASC) 10 MG tablet Take 1 tablet (10 mg total) by mouth daily. 90 tablet 3  . aspirin 81 MG tablet Take 1 tablet (81 mg total) by mouth daily. With Food 30 tablet 2  . carboxymethylcellulose (REFRESH PLUS) 0.5 % SOLN Apply to eye.    . Cholecalciferol (VITAMIN D-3) 1000 units CAPS Take 2,000 Units by mouth daily.    Marland Kitchen dicyclomine (BENTYL) 20 MG tablet Take 1 tablet (20 mg total) by mouth 2 (two) times daily. 20 tablet 0  . FIBER ADULT GUMMIES PO Take by mouth.    . gabapentin (NEURONTIN) 300 MG capsule Take 1 capsule (300 mg total) by mouth 3 (three) times daily as needed (nerve pain). 90 capsule 3  . metoprolol succinate  (TOPROL-XL) 100 MG 24 hr tablet TAKE 1 TABLET BY MOUTH ONCE DAILY. TAKE WITH OR IMMEDIATELY FOLLOWING MEALS 90 tablet 0  . montelukast (SINGULAIR) 10 MG tablet Take 1 tablet (10 mg total) by mouth at bedtime. 90 tablet 1  . Multiple Vitamins-Calcium (ONE-A-DAY WOMENS PO) Take 1 tablet by mouth daily.    . nitroGLYCERIN (NITROSTAT) 0.4 MG SL tablet Place 1 tablet (0.4 mg total) under the tongue every 5 (five) minutes as needed for chest pain. 1 tablet under tongue every 5 minutes as needed for chest discomfort (max 2 Tab/day) 25 tablet 4  . Potassium Chloride ER 20 MEQ TBCR Take 20 mEq by mouth every Monday, Wednesday, Friday, Saturday, and Sunday at 6 PM. 60 tablet 2  . rosuvastatin (CRESTOR) 20 MG tablet Take 1 tablet (20 mg total) by mouth daily. 90 tablet 3   No current facility-administered medications for this visit.     Past Medical History:  Diagnosis Date  . Alcoholism ([place])    Quit 1998  . Allergy   . Anemia   . Anxiety   . 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 Carotid artery stenosis    bilateral---  04-27-2000  s/p  left carotid endarterectomy:  last doppler in epic 08-13-2018  right ICA 40-59%,  right ECA >50%,  left ICA 1-39% ,  bilateral subclavians  stenotic  . CHF (congestive heart failure) ([place])   . Chronic stable angina ([place])    followed by cardiology  . Complication of anesthesia    PONV  . Coronary artery disease cardiologist-- dr Stanford Breed   06-12-2000  s/p  CABG x5  :  cardiac cath 02-21-2018 --  normal LVF, 80% LM, occluded RCA, 95% Ramus and 100% OM,  Patent seqSVG to  PLA-PDA & LIMA to LAD,  seqSVG to OM and RI occluded  . Depression   . Family history of lung cancer   . History of blood transfusion   . History of CVA with residual  deficit    short term memory loss per pt  . History of Helicobacter pylori infection 2001  . Hyperlipidemia   . Hypertension   . Hyperthyroidism    dx yrs ago, per pt have taken medication for awhile  . Mild mitral regurgitation    per echo 06/ 2019  . Peripheral vascular disease ([place])    managed by vascular-- dr Donzetta Matters  . PONV (postoperative nausea and vomiting)   . S/P CABG x 5 06-12-2000   by dr Lucianne Lei tright @[facility]   . Stroke [facility]) 2002   mild memory loss    Past Surgical History:  Procedure Laterality Date  . ABDOMINAL AORTOGRAM W/LOWER EXTREMITY N/A 06/13/2017   Procedure: ABDOMINAL AORTOGRAM W/LOWER EXTREMITY;  Surgeon: Waynetta Sandy, MD;  Location: [place] [facility];  Service: Cardiovascular;  Laterality: N/A;  Bilateral  . ABDOMINAL HYSTERECTOMY  1998  . BREAST LUMPECTOMY WITH AXILLARY LYMPH NODE DISSECTION Left 1999  . CARDIAC CATHETERIZATION  04-24-2000  dr Caryl Comes   3V CAD , subtotal PDA w/ slow flow colleterals , ef 48%  . CARDIAC CATHETERIZATION  06/11/2000   3V CAD with progression of LAD disease  . CAROTID ENDARTERECTOMY Left 04-27-2000   dr Amedeo Plenty  @[facility]   . COLONOSCOPY  06/24/2019   [facility]  . CORONARY ARTERY BYPASS GRAFT  06-12-2000   dr Lucianne Lei tright @[facility]    x 5 (left internal mammary artery to left anterior  descending coronary artery  . LEFT HEART CATH AND CORS/GRAFTS ANGIOGRAPHY N/A 02/21/2018   Procedure: LEFT HEART CATH AND CORS/GRAFTS ANGIOGRAPHY;  Surgeon: Lorretta Harp, MD;  Location: [place] [facility];  Service: Cardiovascular;  Laterality: N/A;  . PERIPHERAL VASCULAR INTERVENTION  06/13/2017   Procedure: PERIPHERAL VASCULAR INTERVENTION;  Surgeon: Waynetta Sandy, MD;  Location: [place] [facility];  Service: Cardiovascular;;  Bilateral Iliac Stents  . ROBOTIC ASSISTED SALPINGO OOPHERECTOMY Bilateral 03/13/2019   Procedure: XI ROBOTIC ASSISTED BILATERAL  SALPINGO OOPHORECTOMY;  Surgeon: Everitt Amber, MD;  Location: [facility];   Service: Gynecology;  Laterality: Bilateral;  . TOTAL MASTECTOMY Bilateral 11/07/2018   Procedure: BILATERAL MASTECTOMIES;  Surgeon: Stark Klein, MD;  Location: [place];  Service: General;  Laterality: Bilateral;    Social History   Socioeconomic History  . Marital status: Single    Spouse name: Not on file  . Number of children: 1  . Years of education: Not on file  . Highest education level: Not on file  Occupational History  . 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 Smoking status: Former Smoker    Years: 17.00    Types: Cigarettes    Quit date: 09/11/2001    Years since quitting: 18.6  . Smokeless tobacco: Never Used  Vaping Use  . Vaping Use: Never used  Substance  and Sexual Activity  . Alcohol use: Not Currently    Comment: Quit 1998  . Drug use: No  . Sexual activity: Not on file    Comment: Hysterectomy  Other Topics Concern  . Not on file  Social History Narrative  . Not on file   Social Determinants of Health   Financial Resource Strain: Low Risk   . Difficulty of Paying Living Expenses: Not hard at all  Food Insecurity:   . Worried About Charity fundraiser in the Last Year: Not on file  . Ran Out of Food in the Last Year: Not on file  Transportation Needs:   . Lack of Transportation (Medical): Not on file  . Lack of Transportation (Non-Medical): Not on file  Physical Activity:   . Days of Exercise per Week: Not on file  . Minutes of Exercise per Session: Not on file  Stress:   . Feeling of Stress : Not on file  Social Connections:   . Frequency of Communication with Friends and Family: Not on file  . Frequency of Social Gatherings with Friends and Family: Not on file  . Attends Religious Services: Not on file  . Active Member of Clubs or Organizations: Not on file  . Attends Archivist Meetings: Not on file  . Marital Status: Not on file  Intimate Partner Violence:   . Fear of  Current or Ex-Partner: Not on file  . Emotionally Abused: Not on file  . Physically Abused: Not on file  . Sexually Abused: Not on file    Family History  Problem Relation Age of Onset  . Lung cancer Mother 56       died at an early age  . Other Other        There is no Hx of premature coronary artery disease in her family  . Colon cancer Neg Hx   . Rectal cancer Neg Hx   . Stomach cancer Neg Hx     ROS: no fevers or chills, productive cough, hemoptysis, dysphasia, odynophagia, melena, hematochezia, dysuria, hematuria, rash, seizure activity, orthopnea, PND, pedal edema, claudication. Remaining systems are negative.  Physical Exam: Well-developed well-nourished in no acute distress.  Skin is warm and dry.  HEENT is normal.  Neck is supple.  Bilateral bruits Chest is clear to auscultation with normal expansion.  Cardiovascular exam is regular rate and rhythm.  Abdominal exam nontender or distended. No masses palpated. Extremities show no edema. neuro grossly intact  A/P  1 coronary artery disease-patient denies chest pain.  Plan to continue medical therapy with aspirin and statin.  2 hypertension-patient's blood pressure is controlled.  Continue present medications and follow.  3 hyperlipidemia-given documented vascular disease we will treat with increased dose of Crestor (40 mg daily); check lipids and liver in 12 weeks.  4 carotid artery disease-patient will need follow-up carotid Dopplers December 2021.  Kirk Ruths, MD

## 2020-05-04 ENCOUNTER — Encounter: Payer: Self-pay | Admitting: Cardiology

## 2020-05-04 ENCOUNTER — Ambulatory Visit (INDEPENDENT_AMBULATORY_CARE_PROVIDER_SITE_OTHER): Payer: Medicare Other | Admitting: Cardiology

## 2020-05-04 ENCOUNTER — Other Ambulatory Visit: Payer: Self-pay

## 2020-05-04 VITALS — BP 112/78 | HR 92 | Ht 63.0 in | Wt 149.0 lb

## 2020-05-04 DIAGNOSIS — I6529 Occlusion and stenosis of unspecified carotid artery: Secondary | ICD-10-CM | POA: Diagnosis not present

## 2020-05-04 DIAGNOSIS — I10 Essential (primary) hypertension: Secondary | ICD-10-CM

## 2020-05-04 DIAGNOSIS — I25118 Atherosclerotic heart disease of native coronary artery with other forms of angina pectoris: Secondary | ICD-10-CM

## 2020-05-04 DIAGNOSIS — E7800 Pure hypercholesterolemia, unspecified: Secondary | ICD-10-CM | POA: Diagnosis not present

## 2020-05-04 MED ORDER — ROSUVASTATIN CALCIUM 40 MG PO TABS: 40.0000 mg | ORAL_TABLET | Freq: Every day | ORAL | 3 refills | Status: DC

## 2020-05-04 NOTE — Patient Instructions (Signed)
Medication Instructions:   INCREASE ROSUVASTATIN TO 40 MG ONCE DAILY= 2 OF THE 20 MG TABLETS ONCE DAILY  *If you need a refill on your cardiac medications before your next appointment, please call your pharmacy*   Lab Work:  Your physician recommends that you return for lab work in 12 WEEKS=FASTING  If you have labs (blood work) drawn today and your tests are completely normal, you will receive your results only by: Marland Kitchen MyChart Message (if you have MyChart) OR . A paper copy in the mail If you have any lab test that is abnormal or we need to change your treatment, we will call you to review the results.  Follow-Up: At [facility] you and your health needs are our priority.  As part of our continuing mission to provide you with exceptional heart care, we have created designated Provider Care Teams.  These Care Teams include your primary Cardiologist (physician) and Advanced Practice Providers (APPs -  Physician Assistants and Nurse Practitioners) who all work together to provide you with the care you need, when you need it.  We recommend signing up for the patient portal called "MyChart".  Sign up information is provided on this After Visit Summary.  MyChart is used to connect with patients for Virtual Visits (Telemedicine).  Patients are able to view lab/test results, encounter notes, upcoming appointments, etc.  Non-urgent messages can be sent to your provider as well.   To learn more about what you can do with MyChart, go to [email or web address].    Your next appointment:   12 month(s)  The format for your next appointment:   In Person  Provider:   You may see Kirk Ruths, MD or one of the following Advanced Practice Providers on your designated Care Team:    Kerin Ransom, PA-C  [place], [place]  Coletta Memos, [place]

## 2020-05-05 ENCOUNTER — Other Ambulatory Visit: Payer: Self-pay | Admitting: Cardiology

## 2020-05-05 ENCOUNTER — Other Ambulatory Visit: Payer: Self-pay | Admitting: Family Medicine

## 2020-05-05 ENCOUNTER — Other Ambulatory Visit: Payer: Self-pay | Admitting: Internal Medicine

## 2020-05-05 DIAGNOSIS — E7800 Pure hypercholesterolemia, unspecified: Secondary | ICD-10-CM

## 2020-05-05 DIAGNOSIS — I10 Essential (primary) hypertension: Secondary | ICD-10-CM

## 2020-05-05 DIAGNOSIS — R0602 Shortness of breath: Secondary | ICD-10-CM

## 2020-05-05 DIAGNOSIS — I2510 Atherosclerotic heart disease of native coronary artery without angina pectoris: Secondary | ICD-10-CM

## 2020-05-20 LAB — LIPID PANEL
Chol/HDL Ratio: 2.5 ratio (ref 0.0–4.4)
Cholesterol, Total: 140 mg/dL (ref 100–199)
HDL: 55 mg/dL (ref >39)
LDL Chol Calc (NIH): 72 mg/dL (ref 0–99)
Triglycerides: 62 mg/dL (ref 0–149)
VLDL Cholesterol Cal: 13 mg/dL (ref 5–40)

## 2020-05-20 LAB — HEPATIC FUNCTION PANEL
ALT: 23 IU/L (ref 0–32)
AST: 25 IU/L (ref 0–40)
Albumin: 4.3 g/dL (ref 3.8–4.8)
Alkaline Phosphatase: 98 IU/L (ref 48–121)
Bilirubin Total: 0.7 mg/dL (ref 0.0–1.2)
Bilirubin, Direct: 0.21 mg/dL (ref 0.00–0.40)
Total Protein: 7.5 g/dL (ref 6.0–8.5)

## 2020-06-22 ENCOUNTER — Telehealth: Payer: Self-pay

## 2020-06-22 NOTE — Telephone Encounter (Signed)
Pt called stating she is feeling a "pinching sensation" in her left axilla where her mastectomy was. Pt denies swelling/tightness/heat to LUE and states she does yoga daily. Pt understands she has annual f/u with Dr Jana Hakim scheduled for 11/3 and asks that this appt is moved up sooner. Message sent to scheduling to arrange this.

## 2020-06-23 ENCOUNTER — Telehealth: Payer: Self-pay | Admitting: Oncology

## 2020-06-23 NOTE — Telephone Encounter (Signed)
Scheduled per 10/13 staff message spoke with patient she is aware

## 2020-06-23 NOTE — Telephone Encounter (Signed)
error 

## 2020-06-25 ENCOUNTER — Inpatient Hospital Stay (HOSPITAL_BASED_OUTPATIENT_CLINIC_OR_DEPARTMENT_OTHER): Payer: Medicare Other | Admitting: Oncology

## 2020-06-25 ENCOUNTER — Inpatient Hospital Stay: Payer: Medicare Other | Attending: Oncology

## 2020-06-25 ENCOUNTER — Telehealth: Payer: Self-pay | Admitting: Oncology

## 2020-06-25 ENCOUNTER — Other Ambulatory Visit: Payer: Self-pay

## 2020-06-25 VITALS — BP 100/60 | HR 58 | Resp 18 | Ht 63.0 in | Wt 150.4 lb

## 2020-06-25 DIAGNOSIS — D0512 Intraductal carcinoma in situ of left breast: Secondary | ICD-10-CM | POA: Diagnosis not present

## 2020-06-25 DIAGNOSIS — Z87891 Personal history of nicotine dependence: Secondary | ICD-10-CM | POA: Diagnosis not present

## 2020-06-25 DIAGNOSIS — Z923 Personal history of irradiation: Secondary | ICD-10-CM | POA: Insufficient documentation

## 2020-06-25 DIAGNOSIS — Z1501 Genetic susceptibility to malignant neoplasm of breast: Secondary | ICD-10-CM | POA: Diagnosis not present

## 2020-06-25 DIAGNOSIS — C50112 Malignant neoplasm of central portion of left female breast: Secondary | ICD-10-CM | POA: Diagnosis not present

## 2020-06-25 DIAGNOSIS — Z90722 Acquired absence of ovaries, bilateral: Secondary | ICD-10-CM | POA: Diagnosis not present

## 2020-06-25 DIAGNOSIS — Z853 Personal history of malignant neoplasm of breast: Secondary | ICD-10-CM | POA: Insufficient documentation

## 2020-06-25 DIAGNOSIS — Z9221 Personal history of antineoplastic chemotherapy: Secondary | ICD-10-CM | POA: Diagnosis not present

## 2020-06-25 DIAGNOSIS — Z171 Estrogen receptor negative status [ER-]: Secondary | ICD-10-CM

## 2020-06-25 DIAGNOSIS — Z9013 Acquired absence of bilateral breasts and nipples: Secondary | ICD-10-CM | POA: Insufficient documentation

## 2020-06-25 LAB — CBC WITH DIFFERENTIAL/PLATELET
Abs Immature Granulocytes: 0.01 10*3/uL (ref 0.00–0.07)
Basophils Absolute: 0 10*3/uL (ref 0.0–0.1)
Basophils Relative: 1 %
Eosinophils Absolute: 0.1 10*3/uL (ref 0.0–0.5)
Eosinophils Relative: 1 %
HCT: 40.1 % (ref 36.0–46.0)
Hemoglobin: 13.6 g/dL (ref 12.0–15.0)
Immature Granulocytes: 0 %
Lymphocytes Relative: 32 %
Lymphs Abs: 2.5 10*3/uL (ref 0.7–4.0)
MCH: 31.9 pg (ref 26.0–34.0)
MCHC: 33.9 g/dL (ref 30.0–36.0)
MCV: 94.1 fL (ref 80.0–100.0)
Monocytes Absolute: 0.6 10*3/uL (ref 0.1–1.0)
Monocytes Relative: 7 %
Neutro Abs: 4.7 10*3/uL (ref 1.7–7.7)
Neutrophils Relative %: 59 %
Platelets: 180 10*3/uL (ref 150–400)
RBC: 4.26 MIL/uL (ref 3.87–5.11)
RDW: 12.3 % (ref 11.5–15.5)
WBC: 7.9 10*3/uL (ref 4.0–10.5)
nRBC: 0 % (ref 0.0–0.2)

## 2020-06-25 LAB — COMPREHENSIVE METABOLIC PANEL
ALT: 23 U/L (ref 0–44)
AST: 27 U/L (ref 15–41)
Albumin: 3.8 g/dL (ref 3.5–5.0)
Alkaline Phosphatase: 91 U/L (ref 38–126)
Anion gap: 5 (ref 5–15)
BUN: 17 mg/dL (ref 8–23)
CO2: 27 mmol/L (ref 22–32)
Calcium: 9.4 mg/dL (ref 8.9–10.3)
Chloride: 106 mmol/L (ref 98–111)
Creatinine, Ser: 0.92 mg/dL (ref 0.44–1.00)
GFR, Estimated: >60 mL/min (ref >60)
Glucose, Bld: 104 mg/dL — ABNORMAL HIGH (ref 70–99)
Potassium: 3.6 mmol/L (ref 3.5–5.1)
Sodium: 138 mmol/L (ref 135–145)
Total Bilirubin: 0.9 mg/dL (ref 0.3–1.2)
Total Protein: 7.8 g/dL (ref 6.5–8.1)

## 2020-06-25 NOTE — Telephone Encounter (Signed)
Scheduled appt per 10/15 los - mailed letter with appt date and time.   Contacted Dr. Iran Planas office for referral and faxed  Info over.

## 2020-06-25 NOTE — Progress Notes (Signed)
Brenda [name]  [phone] [phone]    ID: DYNVER CLEMSON DOB: 1956/08/08  MR#: [id number]  DTH#:[id number]  Patient Care Team: [name], MD as PCP - General (Internal Medicine) Stanford Breed Denice Bors, MD as PCP - Cardiology (Cardiology) Reynold Bowen, MD as Consulting Physician (Endocrinology) Stark Klein, MD as Consulting Physician (General Surgery) Jama Mcmiller, Virgie Dad, MD as Consulting Physician (Oncology) Irene Limbo, MD as Consulting Physician (Plastic Surgery) Juanita Craver, MD as Consulting Physician (Gastroenterology) Everitt Amber, MD as Consulting Physician (Gynecologic Oncology) Charlton Haws, [facility] as Pharmacist (Pharmacist) OTHER MD:   CHIEF COMPLAINT: PALB2 POSITIVE Ductal carcinoma in situ, estrogen receptor positive (s/p bilateral mastectomies)  CURRENT TREATMENT: Observation   INTERVAL HISTORY: Brenda [name] was seen today for follow-up of her 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 She continues under observation.   She noted a change in her left chest wall area a few days ago and called [place] to move up the appointment she already had scheduled for November.  After a couple of days though the problem faded and has not recurred.  She describes the pain as like a stretch or like a tingling or like a sense of tightness in the lateral aspect of the left chest wall.  He was not achy and it was not burning.    REVIEW OF SYSTEMS: Brenda [name] is very active doing yoga and weights.  She has received the [place] vaccine x2 and is scheduled for the booster.  She is concerned about a friend who has prostate cancer.  Otherwise a detailed review of systems today was stable   HISTORY OF CURRENT ILLNESS: From the original intake note:  Brenda [name] has a remote history of breast cancer, and she underwent a lumpectomy of the left breast in 1999.  She does not remember the size of the tumor but knows that 8 lymph nodes were  removed and that they were all clear.  She was treated with chemotherapy and radiation.  She does not recall other details.  She did not receive antiestrogens  More recently, on 08/12/2018 [name] had routine screening mammography showing a possible abnormality in the left breast. She underwent unilateral left diagnostic mammography with tomography at [facility] on 08/15/2018 showing: Breast Density Category B. Spot compression magnification views were performed over the upper outer far posterior left breast. There are grouped microcalcifications in the region of the lumpectomy site in the far upper outer posterior left breast varying in shape, size, and density, spanning approximately 2.7 cm.   Accordingly on 08/19/2018 she proceeded to biopsy of the left breast area in question. The pathology from this procedure showed ([id number]): ductal carcinoma in situ, intermediate nuclear grade with calcifications. Prognostic indicators significant for: estrogen receptor, 100% positive and progesterone receptor, 90% positive, both with strong staining intensity.   The patient's subsequent history is as detailed above.   PAST MEDICAL HISTORY: Past Medical History:  Diagnosis Date  . Alcoholism ([place])    Quit 1998  . Allergy   . Anemia   . Anxiety   . 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 Carotid artery stenosis    bilateral---  04-27-2000  s/p  left carotid endarterectomy:  last doppler in epic 08-13-2018  right  ICA 40-59%,  right ECA >50%,  left ICA 1-39% ,  bilateral subclavians stenotic  . CHF (congestive heart failure) ([place])   . Chronic stable angina ([place])    followed by cardiology  . Complication of anesthesia    PONV  . Coronary artery disease cardiologist-- dr  Stanford Breed   06-12-2000  s/p  CABG x5  :  cardiac cath 02-21-2018 --  normal LVF, 80% LM, occluded RCA, 95% Ramus and 100% OM,  Patent seqSVG to  PLA-PDA & LIMA to LAD,  seqSVG to OM and RI occluded  . Depression   . Family history of lung cancer   . History of blood transfusion   . History of CVA with residual deficit    short term memory loss per pt  . History of Helicobacter pylori infection 2001  . Hyperlipidemia   . Hypertension   . Hyperthyroidism    dx yrs ago, per pt have taken medication for awhile  . Mild mitral regurgitation    per echo 06/ 2019  . Peripheral vascular disease ([place])    managed by vascular-- dr Donzetta Matters  . PONV (postoperative nausea and vomiting)   . S/P CABG x 5 06-12-2000   by dr Lucianne Lei tright @[facility]   . Stroke ([place]) 2002   mild memory loss    PAST SURGICAL HISTORY: Past Surgical History:  Procedure Laterality Date  . ABDOMINAL AORTOGRAM W/LOWER EXTREMITY N/A 06/13/2017   Procedure: ABDOMINAL AORTOGRAM W/LOWER EXTREMITY;  Surgeon: Waynetta Sandy, MD;  Location: [facility];  Service: Cardiovascular;  Laterality: N/A;  Bilateral  . ABDOMINAL HYSTERECTOMY  1998  . BREAST LUMPECTOMY WITH AXILLARY LYMPH NODE DISSECTION Left 1999  . CARDIAC CATHETERIZATION  04-24-2000  dr Caryl Comes   3V CAD , subtotal PDA w/ slow flow colleterals , ef 48%  . CARDIAC CATHETERIZATION  06/11/2000   3V CAD with progression of LAD disease  . CAROTID ENDARTERECTOMY Left 04-27-2000   dr Amedeo Plenty  @[facility]   . COLONOSCOPY  06/24/2019   [place]  . CORONARY ARTERY BYPASS GRAFT  06-12-2000   dr Lucianne Lei tright @[facility]    x 5 (left internal mammary artery to left anterior  descending coronary artery  . LEFT HEART CATH AND CORS/GRAFTS ANGIOGRAPHY N/A 02/21/2018   Procedure: LEFT HEART CATH AND CORS/GRAFTS ANGIOGRAPHY;  Surgeon: Lorretta Harp, MD;  Location: [facility];  Service: Cardiovascular;  Laterality: N/A;  . PERIPHERAL VASCULAR INTERVENTION  06/13/2017   Procedure: PERIPHERAL VASCULAR  INTERVENTION;  Surgeon: Waynetta Sandy, MD;  Location: [place];  Service: Cardiovascular;;  Bilateral Iliac Stents  . ROBOTIC ASSISTED SALPINGO OOPHERECTOMY Bilateral 03/13/2019   Procedure: XI ROBOTIC ASSISTED BILATERAL  SALPINGO OOPHORECTOMY;  Surgeon: Everitt Amber, MD;  Location: [facility];  Service: Gynecology;  Laterality: Bilateral;  . TOTAL MASTECTOMY Bilateral 11/07/2018   Procedure: BILATERAL MASTECTOMIES;  Surgeon: Stark Klein, MD;  Location: [facility];  Service: General;  Laterality: Bilateral;    FAMILY HISTORY: Family History  Problem Relation Age of Onset  . Lung cancer Mother 26       died at an early age  . Other Other        There is no Hx of premature coronary artery disease in her family  . Colon cancer Neg Hx   . Rectal cancer Neg Hx   . Stomach cancer Neg Hx    Shanele's has no information on her father.  The patient's mother died when she was 34  years old and the patient was put in foster care and partly raised by great aunts. The patient has 1 sister, but she does not know her sister's medical history.   GYNECOLOGIC HISTORY:  No LMP recorded (lmp unknown). Patient has had a hysterectomy. Menarche age 1, first live birth age 50, the patient is GX P1.  She had hysterectomy around age 22.  She did not receive hormone replacement.  She did not have bilateral salpingo-oophorectomy   SOCIAL HISTORY: (updated 10/25/2018) Raquel Sarna grew up in a foster home given that her mother died so young.  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  Daughter [name], age 58, works for the [employer] but currently is under [employer] because of an injury inflicted by a Ship broker.  The patient has 2 granddaughters, age 56 and 54 as of January 2019.  At home is just the patient. Daneshia's sister lives outside of [place],  [place]: Not in place   HEALTH MAINTENANCE: Social History   Tobacco Use  . Smoking status: Former Smoker    Years: 17.00    Types: Cigarettes    Quit date: 09/11/2001    Years since quitting: 18.8  . Smokeless tobacco: Never Used  Vaping Use  . Vaping Use: Never used  Substance Use Topics  . Alcohol use: Not Currently    Comment: Quit 1998  . Drug use: No    Colonoscopy: January 2012  PAP: Status post hysterectomy  Bone density: May 2012 at [facility], T score -0.5   Allergies  Allergen Reactions  . Adhesive [Tape] Itching  . Latex Rash    Current Outpatient Medications  Medication Sig Dispense Refill  . amLODipine (NORVASC) 10 MG tablet Take 1 tablet by mouth once daily 90 tablet 4  . aspirin 81 MG tablet Take 1 tablet (81 mg total) by mouth daily. With Food 30 tablet 2  . carboxymethylcellulose (REFRESH PLUS) 0.5 % SOLN Apply to eye.    . Cholecalciferol (VITAMIN D-3) 1000 units CAPS Take 2,000 Units by mouth daily.    Marland Kitchen dicyclomine (BENTYL) 20 MG tablet Take 1 tablet (20 mg total) by mouth 2 (two) times daily. 20 tablet 0  . FIBER ADULT GUMMIES PO Take by mouth.    . gabapentin (NEURONTIN) 300 MG capsule Take 1 capsule (300 mg total) by mouth 3 (three) times daily as needed (nerve pain). 90 capsule 3  . metoprolol succinate (TOPROL-XL) 100 MG 24 hr tablet TAKE 1 TABLET BY MOUTH ONCE DAILY WITH OR IMMEDIATELY FOLLOWING MEALS. 90 tablet 4  . montelukast (SINGULAIR) 10 MG tablet Take 1 tablet (10 mg total) by mouth at bedtime. 90 tablet 1  . Multiple Vitamins-Calcium (ONE-A-DAY WOMENS PO) Take 1 tablet by mouth daily.    . nitroGLYCERIN (NITROSTAT) 0.4 MG SL tablet Place 1 tablet (0.4 mg total) under the tongue every 5 (five) minutes as needed for chest pain. 1 tablet under tongue every 5 minutes as needed for chest discomfort (max 2 Tab/day) 25 tablet 4  . Potassium Chloride ER 20 MEQ TBCR TAKE 1 TABLET BY MOUTH ON MONDAY,WEDNESDAYS,FRIDAYS AND SUNDAYS 30  tablet 2  . rosuvastatin (CRESTOR) 40 MG tablet Take 1 tablet (40 mg total) by mouth daily. 90 tablet 3   No current facility-administered medications for this visit.     OBJECTIVE: African-American woman who looks younger  than stated age  43:   06/25/20 1305  BP: 100/60  Pulse: (!) 58  Resp: 18  SpO2: 100%     Body mass index is 26.64 kg/m.   Wt Readings from Last 3 Encounters:  06/25/20 150 lb 6.4 oz (68.2 kg)  05/04/20 149 lb (67.6 kg)  04/15/20 150 lb (68 kg)      ECOG FS:1 - Symptomatic but completely ambulatory  Sclerae unicteric, EOMs intact Wearing a mask No cervical or supraclavicular adenopathy Lungs no rales or rhonchi Heart regular rate and rhythm Abd soft, nontender, positive bowel sounds MSK no focal spinal tenderness, no upper extremity lymphedema Neuro: nonfocal, well oriented, appropriate affect Breasts: Status post bilateral mastectomies.  There is no evidence of chest wall recurrence.  The chest wall on the right is a little bit more irregular and there is a little bit more of a "dog ear" than on the left.  Both axillae are benign.   LAB RESULTS:  CMP     Component Value Date/Time   NA 139 09/04/2019 0827   K 4.1 09/04/2019 0827   CL 101 09/04/2019 0827   CO2 20 09/04/2019 0827   GLUCOSE 114 (H) 09/04/2019 0827   GLUCOSE 117 (H) 08/08/2019 0550   BUN 18 09/04/2019 0827   CREATININE 1.00 09/04/2019 0827   CREATININE 1.14 (H) 10/08/2018 1109   CREATININE 1.04 (H) 01/31/2017 0915   CALCIUM 9.8 09/04/2019 0827   PROT 7.5 05/20/2020 0905   ALBUMIN 4.3 05/20/2020 0905   AST 25 05/20/2020 0905   AST 22 10/08/2018 1109   ALT 23 05/20/2020 0905   ALT 22 10/08/2018 1109   ALKPHOS 98 05/20/2020 0905   BILITOT 0.7 05/20/2020 0905   BILITOT 0.9 10/08/2018 1109   GFRNONAA 59 (L) 09/04/2019 0827   GFRNONAA 51 (L) 10/08/2018 1109   GFRAA 68 09/04/2019 0827   GFRAA 59 (L) 10/08/2018 1109    Lab Results  Component Value Date   WBC 7.9  06/25/2020   NEUTROABS 4.7 06/25/2020   HGB 13.6 06/25/2020   HCT 40.1 06/25/2020   MCV 94.1 06/25/2020   PLT 180 06/25/2020   No results found for: LABCA2  No components found for: [id number]  No results for input(s): INR in the last 168 hours.  No results found for: LABCA2  No results found for: TIR443  No results found for: XVQ008  No results found for: QPY195  No results found for: CA2729  No components found for: HGQUANT  No results found for: CEA1 / No results found for: CEA1   No results found for: AFPTUMOR  No results found for: CHROMOGRNA  No results found for: TOTALPROTELP, ALBUMINELP, A1GS, A2GS, BETS, BETA2SER, GAMS, MSPIKE, SPEI (this displays SPEP labs)  No results found for: KPAFRELGTCHN, LAMBDASER, KAPLAMBRATIO (kappa/lambda light chains)  No results found for: HGBA, HGBA2QUANT, HGBFQUANT, HGBSQUAN (Hemoglobinopathy evaluation)   Lab Results  Component Value Date   LDH 155 07/03/2019    Lab Results  Component Value Date   IRON 75 07/03/2019   IRONPCTSAT 24.0 07/03/2019   (Iron and TIBC)  Lab Results  Component Value Date   FERRITIN 128.8 07/03/2019    Urinalysis    Component Value Date/Time   COLORURINE YELLOW 08/05/2019 1530   APPEARANCEUR CLOUDY (A) 08/05/2019 1530   LABSPEC 1.008 08/05/2019 [place] 5.0 08/05/2019 [place] 08/05/2019 [place] 08/05/2019 [place] 08/05/2019 [place] 08/05/2019 1530  PROTEINUR NEGATIVE 08/05/2019 1530   NITRITE NEGATIVE 08/05/2019 1530   LEUKOCYTESUR SMALL (A) 08/05/2019 1530    STUDIES:  No results found.   ELIGIBLE FOR AVAILABLE RESEARCH PROTOCOL: no   ASSESSMENT: 64 y.o. PALB2 positive [place] woman status post left breast biopsy 08/19/2018 for ductal carcinoma in situ, grade 2, estrogen and progesterone receptor positive  (1) left lumpectomy 1999 for an ?estrogen receptor negative stage I-II invasive breast  cancer  (a) s/p chemotherapy  (b) s/p radiation  (2) genetics testing 10/14/2018 through the Common Hereditary Cancer Panel + EGFR was POSITIVE for a pathogenic variant in PALB2 c.3113G>A (p.Trp1038*).  (a) no additional mutations were noted in APC, ATM, AXIN2, BARD1, BMPR1A, BRCA1, BRCA2, BRIP1, BUB1B, CDH1, CDK4, CDKN2A, CHEK2, CTNNA1, DICER1, ENG, EGFR, EPCAM, GALNT12, GREM1, HOXB13, KIT, MEN1, MLH1, MLH3, MSH2, MSH3, MSH6, MUTYH, NBN, NF1, NTHL1, PALB2, PDGFRA, PMS2, POLD1, POLE, PTEN, RAD50, RAD51C, RAD51D, RNF43, RPS20, SDHA, SDHB, SDHC, SDHD, SMAD4, SMARCA4, STK11, TP53, TSC1, TSC2, VHL.  (3) s/p bilateral mastectomies 11/07/2018 showing  (a) on the right, no evidence of carcinoma  (b) on the left, no evidence of residual ductal carcinoma in situ  (4) ovarian cancer risk: BSO recommended given lack of family history information  (a) status post remote hysterectomy  (b) bilateral salpingo-oophorectomy 03/31/2019, with benign pathology.  (5) pancreatic cancer risk:  (a) MRI of the abdomen with and without contrast June 29, 2017 documented 0.8 cm cyst in the tail of the pancreas appearing to communicate with the main duct.    (b) MRCP 07/04/2018 shows the known cyst in the tail of the pancreas to measure 0.7 cm   (c) CT of the abdomen and pelvis November 2020 showed no evidence of ductal dilatation   PLAN: Chapel is now close to 2 years out from her definitive surgery with no evidence of disease recurrence.  This is favorable.  I reassured her that what she was feeling was most likely musculoskeletal strain.  In general most of the little aches and pains that we developed last 2 to 4days whereas cancer pain generally tends to be relentless and progressive.  I gave her the name of our prostate cancer specialist so she can pass it onto her friend.  She will return to see me in 1 year.  She knows to call for any other issue that may develop before then  Total encounter time 25  minutes.*.   Finesse Fielder, Virgie Dad, MD  06/25/20 1:40 PM Medical Oncology and Hematology [facility] [place], [place] [address] Tel. [phone]    Fax. [phone]    I, Wilburn Mylar, am acting as scribe for Dr. Virgie Dad. Caden Fatica.  I, Lurline Del MD, have reviewed the above documentation for accuracy and completeness, and I agree with the above.   *Total Encounter Time as defined by the Centers for Medicare and Medicaid Services includes, in addition to the face-to-face time of a patient visit (documented in the note above) non-face-to-face time: obtaining and reviewing outside history, ordering and reviewing medications, tests or procedures, care coordination (communications with other health care professionals or caregivers) and documentation in the medical record.

## 2020-07-12 ENCOUNTER — Ambulatory Visit: Payer: Medicare Other | Admitting: Pharmacist

## 2020-07-12 ENCOUNTER — Other Ambulatory Visit: Payer: Self-pay

## 2020-07-12 DIAGNOSIS — E7800 Pure hypercholesterolemia, unspecified: Secondary | ICD-10-CM

## 2020-07-12 DIAGNOSIS — I2510 Atherosclerotic heart disease of native coronary artery without angina pectoris: Secondary | ICD-10-CM

## 2020-07-12 DIAGNOSIS — I10 Essential (primary) hypertension: Secondary | ICD-10-CM

## 2020-07-12 NOTE — Patient Instructions (Signed)
Visit Information  Phone number for Pharmacist: [phone]  Goals Addressed            This Visit's Progress   . Pharmacy Care Plan   On track    CARE PLAN ENTRY (see longitudinal plan of care for additional care plan information)  Current Barriers:  . Chronic Disease Management support, education, and care coordination needs related to Hypertension, Hyperlipidemia, and Coronary Artery Disease   Hypertension BP Readings from Last 3 Encounters:  03/09/20 140/70  12/17/19 114/68  11/20/19 120/72 .  Pharmacist Clinical Goal(s): o Over the next 90 days, patient will work with PharmD and providers to achieve BP goal <130/80 . Current regimen:  o Amlodipine 10 mg daily o Metoprolol succinate 100 mg daily . Interventions: o Discussed BP goals and benefits of medication for prevention of heart attack / stroke o Recommended to move metoprolol to evening to avoid midday fatigue . Patient self care activities - Over the next 90 days, patient will: o Check BP as needed, document, and provide at future appointments o Ensure daily salt intake < 2300 mg/day  Hyperlipidemia / CAD Lab Results  Component Value Date/Time   LDLCALC 57 05/28/2019 11:13 AM   LDLDIRECT 151.5 04/03/2013 08:32 AM .  Pharmacist Clinical Goal(s): o Over the next 90 days, patient will work with PharmD and providers to maintain LDL goal < 70 . Current regimen:  o Rosuvastatin 40 mg daily o Nitroglycerin 0.4 mg SL prn o Aspirin 81 mg daily . Interventions: o Discussed cholesterol goals and benefits of medication for prevention of heart attack / stroke . Patient self care activities - Over the next 90 days, patient will: o Continue medication as prescribed o Continue low cholesterol diet and exercise routine  Medication management . Pharmacist Clinical Goal(s): o Over the next 90 days, patient will work with PharmD and providers to achieve optimal medication adherence . Current pharmacy:  [facility] . Interventions o Comprehensive medication review performed. o Utilize [facility] pharmacy for medication synchronization, packaging and delivery . Patient self care activities - Over the next 90 days, patient will: o Focus on medication adherence by pill pack o Take medications as prescribed o Report any questions or concerns to PharmD and/or provider(s)  Please see past updates related to this goal by clicking on the "Past Updates" button in the selected goal       Patient verbalizes understanding of instructions provided today.  Telephone follow up appointment with pharmacy team member scheduled for: 3 months  Charlene Brooke, PharmD, [facility] Clinical Pharmacist [facility] [phone]

## 2020-07-12 NOTE — Chronic Care Management (AMB) (Signed)
Chronic Care Management Pharmacy  Name: Brenda [name]  MRN: [id number] DOB: June 02, 1956   Chief Complaint/ HPI  Brenda [name],  64 y.o. , female presents for their Follow-Up CCM visit with the clinical pharmacist via telephone.  PCP : [name], MD Patient Care Team: [name], MD as PCP - General (Internal Medicine) Stanford Breed Denice Bors, MD as PCP - Cardiology (Cardiology) Reynold Bowen, MD as Consulting Physician (Endocrinology) Stark Klein, MD as Consulting Physician (General Surgery) Magrinat, Virgie Dad, MD as Consulting Physician (Oncology) Irene Limbo, MD as Consulting Physician (Plastic Surgery) Juanita Craver, MD as Consulting Physician (Gastroenterology) Everitt Amber, MD as Consulting Physician (Gynecologic Oncology) Charlton Haws, [facility] as Pharmacist (Pharmacist)  Their chronic conditions include: Hypertension, Hyperlipidemia, Coronary Artery Disease, Hypothyroidism, Anxiety, Allergic Rhinitis and Hx breast cancer (dx 1999), PVD s/p stent, adjustment disorder, CVA  From [place], her mother died of lung cancer when she was 60, spent some time in foster care. 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 She went to [employer] for college and stayed ever since. Single mother, 1 daughter, 2 granddaughters. Hyperthyroid issues in the late 90s, declined radiation and pursued medical therapy. Breast cancer in 1999 w/ chemo and radiation. CABG in 2001 (likely related to radiation per patient). Retired in 2002. Exercise is "my lifeline" Pt reports a lot of her BP issues in the past stemmed from family stress. Pt reports she is having issues with memory, sometimes forgets to take her meds.  Office Visits: 12/17/19 Dr Sharlet Salina OV: f/u for depression, will start counseling.  Consult Visit: 06/25/20 Dr Jana Hakim (oncology): s/p lumpectomy 1999, s/p bl mastectomies 10/2018. Recent pain likely MSK.   05/04/20 Dr Stanford Breed (cardiology): increased  rosuvastatin to 40 mg, recheck lipid and LFT 12 wks  03/09/20 Dr Georgina Snell (sports med): LE pain and paresthesia w/ degenerative changes, plan MRI lumbar spine, rx gabapentin for nerve pain.  11/20/19 Dr Stanford Breed (cardiology): CABG 2001, cath 2019, PVD, CVA. Previously stopped lisinopril, amlodipine, spironolactone d/t orthostasis w/ wt loss, restarted amlodipine as BP increased.  Allergies  Allergen Reactions  . Adhesive [Tape] Itching  . Latex Rash   Medications: Outpatient Encounter Medications as of 07/12/2020  Medication Sig  . amLODipine (NORVASC) 10 MG tablet Take 1 tablet by mouth once daily  . aspirin 81 MG tablet Take 1 tablet (81 mg total) by mouth daily. With Food  . carboxymethylcellulose (REFRESH PLUS) 0.5 % SOLN Apply to eye.  . Cholecalciferol (VITAMIN D-3) 1000 units CAPS Take 2,000 Units by mouth daily.  Marland Kitchen dicyclomine (BENTYL) 20 MG tablet Take 1 tablet (20 mg total) by mouth 2 (two) times daily.  Marland Kitchen FIBER ADULT GUMMIES PO Take by mouth.  . gabapentin (NEURONTIN) 300 MG capsule Take 1 capsule (300 mg total) by mouth 3 (three) times daily as needed (nerve pain).  . metoprolol succinate (TOPROL-XL) 100 MG 24 hr tablet TAKE 1 TABLET BY MOUTH ONCE DAILY WITH OR IMMEDIATELY FOLLOWING MEALS.  . montelukast (SINGULAIR) 10 MG tablet Take 1 tablet (10 mg total) by mouth at bedtime.  . Multiple Vitamins-Calcium (ONE-A-DAY WOMENS PO) Take 1 tablet by mouth daily.  . nitroGLYCERIN (NITROSTAT) 0.4 MG SL tablet Place 1 tablet (0.4 mg total) under the tongue every 5 (five) minutes as needed for chest pain. 1 tablet under tongue every 5 minutes as needed for chest discomfort (max 2 Tab/day)  . Potassium Chloride ER 20 MEQ TBCR TAKE 1 TABLET BY MOUTH ON MONDAY,WEDNESDAYS,FRIDAYS AND SUNDAYS  .  rosuvastatin (CRESTOR) 40 MG tablet Take 1 tablet (40 mg total) by mouth daily.   No facility-administered encounter medications on file as of 07/12/2020.   Wt Readings from Last 3 Encounters:  06/25/20  150 lb 6.4 oz (68.2 kg)  05/04/20 149 lb (67.6 kg)  04/15/20 150 lb (68 kg)   Current Diagnosis/Assessment:    Goals Addressed            This Visit's Progress   . Pharmacy Care Plan   On track    CARE PLAN ENTRY (see longitudinal plan of care for additional care plan information)  Current Barriers:  . Chronic Disease Management support, education, and care coordination needs related to Hypertension, Hyperlipidemia, and Coronary Artery Disease   Hypertension BP Readings from Last 3 Encounters:  03/09/20 140/70  12/17/19 114/68  11/20/19 120/72 .  Pharmacist Clinical Goal(s): o Over the next 90 days, patient will work with PharmD and providers to achieve BP goal <130/80 . Current regimen:  o Amlodipine 10 mg daily o Metoprolol succinate 100 mg daily . Interventions: o Discussed BP goals and benefits of medication for prevention of heart attack / stroke o Recommended to move metoprolol to evening to avoid midday fatigue . Patient self care activities - Over the next 90 days, patient will: o Check BP as needed, document, and provide at future appointments o Ensure daily salt intake < 2300 mg/day  Hyperlipidemia / CAD Lab Results  Component Value Date/Time   LDLCALC 57 05/28/2019 11:13 AM   LDLDIRECT 151.5 04/03/2013 08:32 AM .  Pharmacist Clinical Goal(s): o Over the next 90 days, patient will work with PharmD and providers to maintain LDL goal < 70 . Current regimen:  o Rosuvastatin 40 mg daily o Nitroglycerin 0.4 mg SL prn o Aspirin 81 mg daily . Interventions: o Discussed cholesterol goals and benefits of medication for prevention of heart attack / stroke . Patient self care activities - Over the next 90 days, patient will: o Continue medication as prescribed o Continue low cholesterol diet and exercise routine  Medication management . Pharmacist Clinical Goal(s): o Over the next 90 days, patient will work with PharmD and providers to achieve optimal  medication adherence . Current pharmacy: [facility] . Interventions o Comprehensive medication review performed. o Utilize [facility] pharmacy for medication synchronization, packaging and delivery . Patient self care activities - Over the next 90 days, patient will: o Focus on medication adherence by pill pack o Take medications as prescribed o Report any questions or concerns to PharmD and/or provider(s)  Please see past updates related to this goal by clicking on the "Past Updates" button in the selected goal        Hypertension   BP goal is:  <130/80  Office blood pressures are  BP Readings from Last 3 Encounters:  06/25/20 100/60  05/04/20 112/78  04/15/20 102/74   Lab Results  Component Value Date   CREATININE 0.92 06/25/2020   BUN 17 06/25/2020   GFR 57.22 (L) 07/03/2019   GFRNONAA >60 06/25/2020   GFRAA 68 09/04/2019   NA 138 06/25/2020   K 3.6 06/25/2020   CALCIUM 9.4 06/25/2020   CO2 27 06/25/2020   Patient checks BP at home when feeling symptomatic Patient home BP readings are ranging: 115/74  Patient has failed these meds in the past: n/a Patient is currently controlled on the following medications:  . Amlodipine 10 mg daily . Metoprolol succinate 100 mg daily  We discussed diet and exercise extensively; walks  2-3 miles per day, yoga, light weight lifting; she reports occasional dizziness particularly after long AM walks when she doesn't eat breakfast; she does take medications after walking in the AM, advised her to eat a small snack before walk in the morning.   She also reports mid-morning fatigue after taking her medications, advised her to move metoprolol to evening to mitigate this issue  Plan  Continue current medications and control with diet and exercise  Move metoprolol to evening  Hyperlipidemia / CAD / PVD   LDL goal < 70 CABG 2001. Hx CVA  Lipid Panel     Component Value Date/Time   CHOL 140 05/20/2020 0905   TRIG 62 05/20/2020 0905    HDL 55 05/20/2020 0905   LDLCALC 72 05/20/2020 0905   LDLDIRECT 151.5 04/03/2013 0832    Hepatic Function Latest Ref Rng & Units 06/25/2020 05/20/2020 08/08/2019  Total Protein 6.5 - 8.1 g/dL 7.8 7.5 7.7  Albumin 3.5 - 5.0 g/dL 3.8 4.3 4.0  AST 15 - 41 U/L _0 ALT 0 - 44 U/L _1 Alk Phosphatase 38 - 126 U/L 91 98 61  Total Bilirubin 0.3 - 1.2 mg/dL 0.9 0.7 1.3(H)  Bilirubin, Direct 0.00 - 0.40 mg/dL - 0.21 -    Patient has failed these meds in past: n/a Patient is currently controlled on the following medications:  . Rosuvastatin 40 mg daily HS . Aspirin 81 mg daily  Nitroglycerin 0.4 mg SL prn  We discussed:  diet and exercise extensively; Cholesterol goals; benefits of statin for ASCVD risk reduction; pt reports compliance with new higher dose of rosuvastatin  Plan  Continue current medications and control with diet and exercise  Medication Management   Pt uses [facility] for all medications Uses pill box? Yes Pt endorses 100% compliance  We discussed: Discussed benefits of medication synchronization, packaging and delivery as well as enhanced pharmacist oversight with [facility].Pt is happy with [facility] and will continue services  Plan  Continue current medication management strategy    Follow up: 3 month phone visit  Charlene Brooke, PharmD, BCACP Clinical Pharmacist [facility] [phone]

## 2020-07-14 ENCOUNTER — Other Ambulatory Visit: Payer: Medicare Other

## 2020-07-14 ENCOUNTER — Ambulatory Visit: Payer: Medicare Other | Admitting: Oncology

## 2020-08-12 ENCOUNTER — Telehealth: Payer: Self-pay | Admitting: Pharmacist

## 2020-08-12 NOTE — Progress Notes (Signed)
Chronic Care Management Pharmacy Assistant   Name: Brenda [name]  MRN: [id number] DOB: 1956/03/16  Reason for Encounter: Medication adherence Call   PCP : [name], MD  Allergies:   Allergies  Allergen Reactions  . Adhesive [Tape] Itching  . Latex Rash    Medications: Outpatient Encounter Medications as of 08/12/2020  Medication Sig  . amLODipine (NORVASC) 10 MG tablet Take 1 tablet by mouth once daily  . aspirin 81 MG tablet Take 1 tablet (81 mg total) by mouth daily. With Food  . carboxymethylcellulose (REFRESH PLUS) 0.5 % SOLN Apply to eye.  . Cholecalciferol (VITAMIN D-3) 1000 units CAPS Take 2,000 Units by mouth daily.  Marland Kitchen dicyclomine (BENTYL) 20 MG tablet Take 1 tablet (20 mg total) by mouth 2 (two) times daily.  Marland Kitchen FIBER ADULT GUMMIES PO Take by mouth.  . gabapentin (NEURONTIN) 300 MG capsule Take 1 capsule (300 mg total) by mouth 3 (three) times daily as needed (nerve pain).  . metoprolol succinate (TOPROL-XL) 100 MG 24 hr tablet TAKE 1 TABLET BY MOUTH ONCE DAILY WITH OR IMMEDIATELY FOLLOWING MEALS.  . montelukast (SINGULAIR) 10 MG tablet Take 1 tablet (10 mg total) by mouth at bedtime.  . Multiple Vitamins-Calcium (ONE-A-DAY WOMENS PO) Take 1 tablet by mouth daily.  . nitroGLYCERIN (NITROSTAT) 0.4 MG SL tablet Place 1 tablet (0.4 mg total) under the tongue every 5 (five) minutes as needed for chest pain. 1 tablet under tongue every 5 minutes as needed for chest discomfort (max 2 Tab/day)  . Potassium Chloride ER 20 MEQ TBCR TAKE 1 TABLET BY MOUTH ON MONDAY,WEDNESDAYS,FRIDAYS AND SUNDAYS  . rosuvastatin (CRESTOR) 40 MG tablet Take 1 tablet (40 mg total) by mouth daily.   No facility-administered encounter medications on file as of 08/12/2020.    Current Diagnosis: Patient Active Problem List   Diagnosis Date Noted  . Adjustment disorder 12/20/2019  . Bruising 07/03/2019  . Pancreatic insufficiency 05/30/2019  . Routine general medical examination at a  health care facility 05/30/2019  . Flatulence/gas pain/belching 05/02/2019  . Abdominal pain 11/21/2018  . Monoallelic mutation of PALB2 gene 10/25/2018  . Genetic testing 10/25/2018  . Malignant neoplasm of central portion of left breast in female, estrogen receptor negative ([place]) 10/08/2018  . 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 Family history of lung cancer   . Loss of transverse plantar arch of left foot 07/19/2018  . Diarrhea 04/25/2018  . Hypothyroidism 03/02/2018  . Lesion of pancreas 06/22/2017  . Constipation 06/22/2017  . Peripheral vascular disease ([place]) 06/01/2017  . Left leg pain 05/10/2017  . Allergic rhinitis 02/25/2016  . Low back pain 09/29/2015  . Carpal tunnel syndrome, right 08/20/2015  . Hx of completed stroke 05/21/2015  . Memory impairment 05/21/2015  . Hyperthyroidism 12/29/2008  . HYPERCHOLESTEROLEMIA 12/29/2008  . Anxiety 12/29/2008  . Essential hypertension 12/29/2008  . Coronary atherosclerosis 12/29/2008  . CAROTID ARTERY STENOSIS 12/29/2008    Goals Addressed   None     Follow-Up:  Pharmacist Review   The patient was on the medication adherence list for her Rosuvastatin for a last fill date of 05/04/2020. After speaking with the patient she stated that she lost the medicine bottle but she found the bottle and she took the medication this morning.   She stated that she has enough pills on hand and her fill date is off at [facility].   Rosendo Gros, [facility]  Practice Team Manager/ CPA (Clinical Pharmacist Assistant)  [phone]             

## 2020-08-13 ENCOUNTER — Ambulatory Visit (HOSPITAL_COMMUNITY)
Admission: RE | Admit: 2020-08-13 | Discharge: 2020-08-13 | Disposition: A | Discharge status: Home / Self Care | Payer: Medicare Other | Source: Ambulatory Visit | Attending: Cardiovascular Disease | Admitting: Cardiovascular Disease

## 2020-08-13 ENCOUNTER — Other Ambulatory Visit: Payer: Self-pay

## 2020-08-13 ENCOUNTER — Other Ambulatory Visit (HOSPITAL_COMMUNITY): Payer: Self-pay | Admitting: Cardiology

## 2020-08-13 DIAGNOSIS — I6523 Occlusion and stenosis of bilateral carotid arteries: Secondary | ICD-10-CM

## 2020-08-13 DIAGNOSIS — G458 Other transient cerebral ischemic attacks and related syndromes: Secondary | ICD-10-CM

## 2020-08-17 MED ORDER — AMLODIPINE BESYLATE 10 MG PO TABS
10.0000 mg | ORAL_TABLET | Freq: Every day | ORAL | 3 refills | Status: DC
Start: 2020-08-17 — End: 2021-06-02

## 2020-08-24 ENCOUNTER — Encounter: Payer: Self-pay | Admitting: *Deleted

## 2020-09-07 ENCOUNTER — Telehealth: Payer: Self-pay | Admitting: Pharmacist

## 2020-09-07 NOTE — Progress Notes (Signed)
Chronic Care Management Pharmacy Assistant   Name: KAILYN VANDERSLICE  MRN: [id number] DOB: 09-25-1955  Reason for Encounter: General Adherence Call   PCP : [name], MD  Allergies:   Allergies  Allergen Reactions  . Adhesive [Tape] Itching  . Latex Rash    Medications: Outpatient Encounter Medications as of 09/07/2020  Medication Sig  . amLODipine (NORVASC) 10 MG tablet Take 1 tablet (10 mg total) by mouth daily.  Marland Kitchen aspirin 81 MG tablet Take 1 tablet (81 mg total) by mouth daily. With Food  . carboxymethylcellulose (REFRESH PLUS) 0.5 % SOLN Apply to eye.  . Cholecalciferol (VITAMIN D-3) 1000 units CAPS Take 2,000 Units by mouth daily.  Marland Kitchen dicyclomine (BENTYL) 20 MG tablet Take 1 tablet (20 mg total) by mouth 2 (two) times daily.  Marland Kitchen FIBER ADULT GUMMIES PO Take by mouth.  . gabapentin (NEURONTIN) 300 MG capsule Take 1 capsule (300 mg total) by mouth 3 (three) times daily as needed (nerve pain).  . metoprolol succinate (TOPROL-XL) 100 MG 24 hr tablet TAKE 1 TABLET BY MOUTH ONCE DAILY WITH OR IMMEDIATELY FOLLOWING MEALS.  . montelukast (SINGULAIR) 10 MG tablet Take 1 tablet (10 mg total) by mouth at bedtime.  . Multiple Vitamins-Calcium (ONE-A-DAY WOMENS PO) Take 1 tablet by mouth daily.  . nitroGLYCERIN (NITROSTAT) 0.4 MG SL tablet Place 1 tablet (0.4 mg total) under the tongue every 5 (five) minutes as needed for chest pain. 1 tablet under tongue every 5 minutes as needed for chest discomfort (max 2 Tab/day)  . Potassium Chloride ER 20 MEQ TBCR TAKE 1 TABLET BY MOUTH ON MONDAY,WEDNESDAYS,FRIDAYS AND SUNDAYS  . rosuvastatin (CRESTOR) 40 MG tablet Take 1 tablet (40 mg total) by mouth daily.   No facility-administered encounter medications on file as of 09/07/2020.    Current Diagnosis: Patient Active Problem List   Diagnosis Date Noted  . Adjustment disorder 12/20/2019  . Bruising 07/03/2019  . Pancreatic insufficiency 05/30/2019  . Routine general medical  examination at a health care facility 05/30/2019  . Flatulence/gas pain/belching 05/02/2019  . Abdominal pain 11/21/2018  . Monoallelic mutation of PALB2 gene 10/25/2018  . Genetic testing 10/25/2018  . Malignant neoplasm of central portion of left breast in female, estrogen receptor negative ([place]) 10/08/2018  . 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 Family history of lung cancer   . Loss of transverse plantar arch of left foot 07/19/2018  . Diarrhea 04/25/2018  . Hypothyroidism 03/02/2018  . Lesion of pancreas 06/22/2017  . Constipation 06/22/2017  . Peripheral vascular disease ([place]) 06/01/2017  . Left leg pain 05/10/2017  . Allergic rhinitis 02/25/2016  . Low back pain 09/29/2015  . Carpal tunnel syndrome, right 08/20/2015  . Hx of completed stroke 05/21/2015  . Memory impairment 05/21/2015  . Hyperthyroidism 12/29/2008  . HYPERCHOLESTEROLEMIA 12/29/2008  . Anxiety 12/29/2008  . Essential hypertension 12/29/2008  . Coronary atherosclerosis 12/29/2008  . CAROTID ARTERY STENOSIS 12/29/2008    Goals Addressed   None     Follow-Up:  Pharmacist Review   A general adherence wellness call was made to Ms. Hornbeck to see how she has been doing on the metoprolol since she last talk with the clinical pharmacist Mendel Ryder. The patient stated that she was taking metoprolol in the daytime and noticed that she was getting really tired and sleepy through the day. She said that now since she takes it at night she can tell a big difference not napping much in  the daytime. She states that over all she is doing well and not having any other issues with her health at this time. I let the patient know that I would pass along this information to Delsa Bern, [place]

## 2020-09-09 ENCOUNTER — Telehealth: Payer: Self-pay | Admitting: Pharmacist

## 2020-09-09 NOTE — Progress Notes (Signed)
    Chronic Care Management Pharmacy Assistant   Name: Brenda [name]  MRN: [id number] DOB: 1955-09-15  Reason for Encounter: Chart Review   PCP : [name], MD  Allergies:   Allergies  Allergen Reactions  . Adhesive [Tape] Itching  . Latex Rash    Medications: Outpatient Encounter Medications as of 09/09/2020  Medication Sig  . amLODipine (NORVASC) 10 MG tablet Take 1 tablet (10 mg total) by mouth daily.  Marland Kitchen aspirin 81 MG tablet Take 1 tablet (81 mg total) by mouth daily. With Food  . carboxymethylcellulose (REFRESH PLUS) 0.5 % SOLN Apply to eye.  . Cholecalciferol (VITAMIN D-3) 1000 units CAPS Take 2,000 Units by mouth daily.  Marland Kitchen dicyclomine (BENTYL) 20 MG tablet Take 1 tablet (20 mg total) by mouth 2 (two) times daily.  Marland Kitchen FIBER ADULT GUMMIES PO Take by mouth.  . gabapentin (NEURONTIN) 300 MG capsule Take 1 capsule (300 mg total) by mouth 3 (three) times daily as needed (nerve pain).  . metoprolol succinate (TOPROL-XL) 100 MG 24 hr tablet TAKE 1 TABLET BY MOUTH ONCE DAILY WITH OR IMMEDIATELY FOLLOWING MEALS.  . montelukast (SINGULAIR) 10 MG tablet Take 1 tablet (10 mg total) by mouth at bedtime.  . Multiple Vitamins-Calcium (ONE-A-DAY WOMENS PO) Take 1 tablet by mouth daily.  . nitroGLYCERIN (NITROSTAT) 0.4 MG SL tablet Place 1 tablet (0.4 mg total) under the tongue every 5 (five) minutes as needed for chest pain. 1 tablet under tongue every 5 minutes as needed for chest discomfort (max 2 Tab/day)  . Potassium Chloride ER 20 MEQ TBCR TAKE 1 TABLET BY MOUTH ON MONDAY,WEDNESDAYS,FRIDAYS AND SUNDAYS  . rosuvastatin (CRESTOR) 40 MG tablet Take 1 tablet (40 mg total) by mouth daily.   No facility-administered encounter medications on file as of 09/09/2020.    Current Diagnosis: Patient Active Problem List   Diagnosis Date Noted  . Adjustment disorder 12/20/2019  . Bruising 07/03/2019  . Pancreatic insufficiency 05/30/2019  . Routine general medical examination at a  health care facility 05/30/2019  . Flatulence/gas pain/belching 05/02/2019  . Abdominal pain 11/21/2018  . Monoallelic mutation of PALB2 gene 10/25/2018  . Genetic testing 10/25/2018  . Malignant neoplasm of central portion of left breast in female, estrogen receptor negative ([place]) 10/08/2018  . 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 Family history of lung cancer   . Loss of transverse plantar arch of left foot 07/19/2018  . Diarrhea 04/25/2018  . Hypothyroidism 03/02/2018  . Lesion of pancreas 06/22/2017  . Constipation 06/22/2017  . Peripheral vascular disease ([place]) 06/01/2017  . Left leg pain 05/10/2017  . Allergic rhinitis 02/25/2016  . Low back pain 09/29/2015  . Carpal tunnel syndrome, right 08/20/2015  . Hx of completed stroke 05/21/2015  . Memory impairment 05/21/2015  . Hyperthyroidism 12/29/2008  . HYPERCHOLESTEROLEMIA 12/29/2008  . Anxiety 12/29/2008  . Essential hypertension 12/29/2008  . Coronary atherosclerosis 12/29/2008  . CAROTID ARTERY STENOSIS 12/29/2008    Goals Addressed   None     Follow-Up:  Pharmacist Review   Reviewed chart for medication changes and adherence.  No OVs, Consults, or hospital visits since last care coordination call / Pharmacist visit. No medication changes indicated  No gaps in adherence identified. Patient has follow up scheduled with pharmacy team. No further action required.   Rosendo Gros, [facility]  Practice Team Manager/ CPA (Clinical Pharmacist Assistant) [phone]

## 2020-10-12 ENCOUNTER — Ambulatory Visit (INDEPENDENT_AMBULATORY_CARE_PROVIDER_SITE_OTHER): Payer: Medicare Other | Admitting: Pharmacist

## 2020-10-12 ENCOUNTER — Other Ambulatory Visit: Payer: Self-pay

## 2020-10-12 DIAGNOSIS — I2510 Atherosclerotic heart disease of native coronary artery without angina pectoris: Secondary | ICD-10-CM | POA: Diagnosis not present

## 2020-10-12 DIAGNOSIS — I10 Essential (primary) hypertension: Secondary | ICD-10-CM

## 2020-10-12 DIAGNOSIS — E7800 Pure hypercholesterolemia, unspecified: Secondary | ICD-10-CM

## 2020-10-12 NOTE — Chronic Care Management (AMB) (Signed)
Chronic Care Management Pharmacy Note  10/12/2020 Name:  Brenda [name] MRN:  [id number] DOB:  05-11-1956  Subjective: Brenda [name] is an 65 y.o. year old female who is a primary patient of Brenda Koch, MD.  The CCM team was consulted for assistance with disease management and care coordination needs.    Engaged with patient face to face for follow up visit in response to provider referral for pharmacy case management and/or care coordination services.   Consent to Services:  The patient was given the following information about Chronic Care Management services today, agreed to services, and gave verbal consent: 1. CCM service includes personalized support from designated clinical staff supervised by the primary care provider, including individualized plan of care and coordination with other care providers 2. 24/7 contact phone numbers for assistance for urgent and routine care needs. 3. Service will only be billed when office clinical staff spend 20 minutes or more in a month to coordinate care. 4. Only one practitioner may furnish and bill the service in a calendar month. 5.The patient may stop CCM services at any time (effective at the end of the month) by phone call to the office staff. 6. The patient will be responsible for cost sharing (co-pay) of up to 20% of the service fee (after annual deductible is met). Patient agreed to services and consent obtained.  Patient Care Team: Brenda Koch, MD as PCP - General (Internal Medicine) Brenda [name] Brenda Bors, MD as PCP - Cardiology (Cardiology) Brenda Bowen, MD as Consulting Physician (Endocrinology) Brenda Klein, MD as Consulting Physician (General Surgery) Brenda, Virgie Dad, MD as Consulting Physician (Oncology) Brenda Limbo, MD as Consulting Physician (Plastic Surgery) Brenda Craver, MD as Consulting Physician (Gastroenterology) Brenda Amber, MD as Consulting Physician (Gynecologic Oncology) Brenda [name], [facility]  as Pharmacist (Pharmacist)  Patient is from [facility], her mother died of lung cancer when she was 11, spent some time in foster care. 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 She went to [employer] for college and stayed ever since. Single mother, 1 daughter, 2 granddaughters. Hyperthyroid issues in the late 90s, declined radiation and pursued medical therapy. Breast cancer in 1999 w/ chemo and radiation. CABG in 2001 (likely related to radiation per patient). Retired in 2002. Exercise is "my lifeline" Pt reports a lot of her BP issues in the past stemmed from family stress. Pt reports she is having issues with memory, sometimes forgets to take her meds.  Recent office visits: 12/17/19 Dr Sharlet Salina OV: f/u for depression, will start counseling.  Recent consult visits: 06/25/20 Dr Jana Hakim (oncology): s/p lumpectomy 1999, s/p bl mastectomies 10/2018. Recent pain likely MSK.   05/04/20 Dr Brenda [name] (cardiology): increased rosuvastatin to 40 mg, recheck lipid and LFT 12 wks  Objective:  Lab Results  Component Value Date   CREATININE 0.92 06/25/2020   BUN 17 06/25/2020   GFR 57.22 (L) 07/03/2019   GFRNONAA >60 06/25/2020   GFRAA 68 09/04/2019   NA 138 06/25/2020   K 3.6 06/25/2020   CALCIUM 9.4 06/25/2020   CO2 27 06/25/2020    Lab Results  Component Value Date/Time   HGBA1C 6.4 05/28/2019 11:13 AM   HGBA1C 5.7 05/21/2015 10:10 AM   GFR 57.22 (L) 07/03/2019 01:33 PM   GFR 65.73 05/28/2019 11:13 AM    Last diabetic Eye exam: No results found for: HMDIABEYEEXA  Last diabetic Foot exam: No results found for: HMDIABFOOTEX   Lab Results  Component Value Date  CHOL 140 05/20/2020   HDL 55 05/20/2020   LDLCALC 72 05/20/2020   LDLDIRECT 151.5 04/03/2013   TRIG 62 05/20/2020   CHOLHDL 2.5 05/20/2020    Hepatic Function Latest Ref Rng & Units 06/25/2020 05/20/2020 08/08/2019  Total Protein 6.5 - 8.1 g/dL 7.8 7.5 7.7  Albumin 3.5 - 5.0 g/dL 3.8 4.3 4.0  AST 15 - 41 U/L 27  25 25   ALT 0 - 44 U/L 23 23 22   Alk Phosphatase 38 - 126 U/L 91 98 61  Total Bilirubin 0.3 - 1.2 mg/dL 0.9 0.7 1.3(H)  Bilirubin, Direct 0.00 - 0.40 mg/dL - 0.21 -    Lab Results  Component Value Date/Time   TSH 0.62 07/03/2019 01:33 PM   TSH 1.51 04/25/2018 03:45 PM   FREET4 1.06 04/25/2018 03:45 PM   FREET4 0.92 05/21/2015 10:10 AM    CBC Latest Ref Rng & Units 06/25/2020 08/08/2019 08/05/2019  WBC 4.0 - 10.5 K/uL 7.9 11.8(H) 11.5(H)  Hemoglobin 12.0 - 15.0 g/dL 13.6 13.5 14.3  Hematocrit 36.0 - 46.0 % 40.1 39.5 41.7  Platelets 150 - 400 K/uL 180 238 264    Lab Results  Component Value Date/Time   VD25OH 43.58 07/03/2019 01:33 PM   VD25OH 42.62 05/10/2017 02:57 PM    Clinical ASCVD: No  The 10-year ASCVD risk score Mikey Bussing DC Jr., et al., 2013) is: 4%   Values used to calculate the score:     Age: 37 years     Sex: Female     Is Non-Hispanic African American: Yes     Diabetic: No     Tobacco smoker: No     Systolic Blood Pressure: 086 mmHg     Is BP treated: Yes     HDL Cholesterol: 55 mg/dL     Total Cholesterol: 140 mg/dL    BP Readings from Last 3 Encounters:  06/25/20 100/60  05/04/20 112/78  04/15/20 102/74   Pulse Readings from Last 3 Encounters:  06/25/20 (!) 58  05/04/20 92  04/15/20 (!) 58   Wt Readings from Last 3 Encounters:  06/25/20 150 lb 6.4 oz (68.2 kg)  05/04/20 149 lb (67.6 kg)  04/15/20 150 lb (68 kg)    Assessment/Interventions: Review of patient past medical history, allergies, medications, health status, including review of consultants reports, laboratory and other test data, was performed as part of comprehensive evaluation and provision of chronic care management services.   SDOH:  (Social Determinants of Health) assessments and interventions performed:    CCM Care Plan  Allergies  Allergen Reactions  . Adhesive [Tape] Itching  . Latex Rash    Medications Reviewed Today    Reviewed by Brenda [name], [facility] (Pharmacist)  on 10/12/20 at 34  Med List Status: <None>  Medication Order Taking? Sig Documenting Provider Last Dose Status Informant  amLODipine (NORVASC) 10 MG tablet [id number] Yes Take 1 tablet (10 mg total) by mouth daily. Lelon Perla, MD Taking Active   aspirin 81 MG tablet [id number] Yes Take 1 tablet (81 mg total) by mouth daily. With Food Roxan Hockey, MD Taking Active Self  carboxymethylcellulose (REFRESH PLUS) 0.5 % SOLN [id number] Yes Apply to eye. [provider] Taking Active   Cholecalciferol (VITAMIN D-3) 1000 units CAPS [id number] Yes Take 2,000 Units by mouth daily. [provider] Taking Active Self  dicyclomine (BENTYL) 20 MG tablet [id number] Yes Take 1 tablet (20 mg total) by mouth 2 (two) times daily. Lacretia Leigh, MD Taking Active   FIBER  ADULT GUMMIES PO [id number] Yes Take by mouth. [provider] Taking Active   gabapentin (NEURONTIN) 300 MG capsule [id number] Yes Take 1 capsule (300 mg total) by mouth 3 (three) times daily as needed (nerve pain). Gregor Hams, MD Taking Active   metoprolol succinate (TOPROL-XL) 100 MG 24 hr tablet [id number] Yes TAKE 1 TABLET BY MOUTH ONCE DAILY WITH OR IMMEDIATELY FOLLOWING MEALS. Lelon Perla, MD Taking Active   montelukast (SINGULAIR) 10 MG tablet [id number] Yes Take 1 tablet (10 mg total) by mouth at bedtime. Brenda Koch, MD Taking Active   Multiple Vitamins-Calcium (ONE-A-DAY WOMENS PO) [id number] Yes Take 1 tablet by mouth daily. [provider] Taking Active Self  nitroGLYCERIN (NITROSTAT) 0.4 MG SL tablet [id number] Yes Place 1 tablet (0.4 mg total) under the tongue every 5 (five) minutes as needed for chest pain. 1 tablet under tongue every 5 minutes as needed for chest discomfort (max 2 Tab/day) Lelon Perla, MD Taking Active   Potassium Chloride ER 20 MEQ TBCR [id number] Yes TAKE 1 TABLET BY MOUTH ON MONDAY,WEDNESDAYS,FRIDAYS AND SUNDAYS Brenda Koch, MD Taking Active    rosuvastatin (CRESTOR) 40 MG tablet [id number] Yes Take 1 tablet (40 mg total) by mouth daily. Lelon Perla, MD Taking Active           Patient Active Problem List   Diagnosis Date Noted  . Adjustment disorder 12/20/2019  . Bruising 07/03/2019  . Pancreatic insufficiency 05/30/2019  . Routine general medical examination at a health care facility 05/30/2019  . Flatulence/gas pain/belching 05/02/2019  . Abdominal pain 11/21/2018  . Monoallelic mutation of PALB2 gene 10/25/2018  . Genetic testing 10/25/2018  . Malignant neoplasm of central portion of left breast in female, estrogen receptor negative ([place]) 10/08/2018  . 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 Family history of lung cancer   . Loss of transverse plantar arch of left foot 07/19/2018  . Diarrhea 04/25/2018  . Hypothyroidism 03/02/2018  . Lesion of pancreas 06/22/2017  . Constipation 06/22/2017  . Peripheral vascular disease ([place]) 06/01/2017  . Left leg pain 05/10/2017  . Allergic rhinitis 02/25/2016  . Low back pain 09/29/2015  . Carpal tunnel syndrome, right 08/20/2015  . Hx of completed stroke 05/21/2015  . Memory impairment 05/21/2015  . Hyperthyroidism 12/29/2008  . HYPERCHOLESTEROLEMIA 12/29/2008  . Anxiety 12/29/2008  . Essential hypertension 12/29/2008  . Coronary atherosclerosis 12/29/2008  . CAROTID ARTERY STENOSIS 12/29/2008    Immunization History  Administered Date(s) Administered  . Fluad Quad(high Dose 65+) 05/23/2019  . Influenza,inj,Quad PF,6+ Mos 08/20/2015, 06/21/2017  . Influenza-Unspecified 06/11/2014, 07/13/2015, 05/29/2018  . Pneumococcal Polysaccharide-23 03/26/2019  . Tdap 09/11/2008, 06/04/2018, 11/20/2018    Conditions to be addressed/monitored:  CAD, HTN and HLD  Patient Care Plan: CCM Pharmacy Care Plan    Problem Identified: HTN, HLD, CAD   Priority: High    Goal: Patient-Specific Goal   Start Date: 10/12/2020  Expected End Date: 04/11/2021  This  Visit's Progress: On track  Priority: High  Note:   Current Barriers:  . Chronic Disease Management support, education, and care coordination needs related to Hypertension, Hyperlipidemia, and Coronary Artery Disease   Hypertension . Pharmacist Clinical Goal(s): o Over the next 90 days, patient will work with PharmD and providers to achieve BP goal <130/80 . Current regimen:  o Amlodipine 10 mg daily o Metoprolol succinate 100 mg daily . Interventions: o Discussed BP goals and benefits of medication for prevention of heart attack /  stroke o Moving metoprolol to bedtime caused her to occasionally forget the dose, so she moved back to AM with other meds and is doing fin . Patient self care activities - Over the next 90 days, patient will: o Check BP as needed, document, and provide at future appointments o Ensure daily salt intake < 2300 mg/day  Hyperlipidemia / CAD . Pharmacist Clinical Goal(s): o Over the next 90 days, patient will work with PharmD and providers to maintain LDL goal < 70 . Current regimen:  o Rosuvastatin 40 mg daily o Nitroglycerin 0.4 mg SL prn o Aspirin 81 mg daily . Interventions: o Discussed cholesterol goals and benefits of medication for prevention of heart attack / stroke . Patient self care activities - Over the next 90 days, patient will: o Continue medication as prescribed o Continue low cholesterol diet and exercise routine  Medication management . Pharmacist Clinical Goal(s): o Over the next 90 days, patient will work with PharmD and providers to achieve optimal medication adherence . Current pharmacy: [facility] . Interventions o Comprehensive medication review performed. o Utilize [facility] pharmacy for medication synchronization, packaging and delivery . Patient self care activities - Over the next 90 days, patient will: o Focus on medication adherence by pill pack o Take medications as prescribed o Report any questions or concerns to PharmD  and/or provider(s)  Please see past updates related to this goal by clicking on the "Past Updates" button in the selected goal      Medication Assistance: None required.  Patient affirms current coverage meets needs.  Patient's preferred pharmacy is:  [facility] [address] ([address]), [place] [address] [place] ([place]) [place] [address] Phone: [phone] Fax: [phone]  Uses pill box? Yes Pt endorses 100% compliance  We discussed: Current pharmacy is preferred with insurance plan and patient is satisfied with pharmacy services  Plan: Continue current medication management strategy    Follow Up:  Patient agrees to Care Plan and Follow-up.  Plan: Telephone follow up appointment with care management team member scheduled for:  6 months  Charlene Brooke, PharmD, [facility] Clinical Pharmacist [facility] at [facility] [phone]

## 2020-10-12 NOTE — Patient Instructions (Addendum)
Visit Information  Phone number for Pharmacist: [phone]  Goals Addressed            This Visit's Progress   . Track and Manage My Blood Pressure-Hypertension       Timeframe:  Long-Range Goal Priority:  High Start Date:             10/12/20                Expected End Date:       04/11/21                 - check blood pressure 3 times per week - choose a place to take my blood pressure (home, clinic or office, retail store) - write blood pressure results in a log or diary  -Bring log to future doctor appointments   Why is this important?    You won't feel high blood pressure, but it can still hurt your blood vessels.   High blood pressure can cause heart or kidney problems. It can also cause a stroke.   Making lifestyle changes like losing a little weight or eating less salt will help.   Checking your blood pressure at home and at different times of the day can help to control blood pressure.   If the doctor prescribes medicine remember to take it the way the doctor ordered.   Call the office if you cannot afford the medicine or if there are questions about it.         Patient Care Plan: CCM Pharmacy Care Plan    Problem Identified: HTN, HLD, CAD   Priority: High    Goal: Patient-Specific Goal   Start Date: 10/12/2020  Expected End Date: 04/11/2021  This Visit's Progress: On track  Priority: High  Note:   Current Barriers:  . Chronic Disease Management support, education, and care coordination needs related to Hypertension, Hyperlipidemia, and Coronary Artery Disease   Hypertension . Pharmacist Clinical Goal(s): o Over the next 90 days, patient will work with PharmD and providers to achieve BP goal <130/80 . Current regimen:  o Amlodipine 10 mg daily o Metoprolol succinate 100 mg daily . Interventions: o Discussed BP goals and benefits of medication for prevention of heart attack / stroke o Moving metoprolol to bedtime caused her to occasionally forget the  dose, so she moved back to AM with other meds and is doing fin . Patient self care activities - Over the next 90 days, patient will: o Check BP as needed, document, and provide at future appointments o Ensure daily salt intake < 2300 mg/day  Hyperlipidemia / CAD . Pharmacist Clinical Goal(s): o Over the next 90 days, patient will work with PharmD and providers to maintain LDL goal < 70 . Current regimen:  o Rosuvastatin 40 mg daily o Nitroglycerin 0.4 mg SL prn o Aspirin 81 mg daily . Interventions: o Discussed cholesterol goals and benefits of medication for prevention of heart attack / stroke . Patient self care activities - Over the next 90 days, patient will: o Continue medication as prescribed o Continue low cholesterol diet and exercise routine  Medication management . Pharmacist Clinical Goal(s): o Over the next 90 days, patient will work with PharmD and providers to achieve optimal medication adherence . Current pharmacy: [facility] . Interventions o Comprehensive medication review performed. o Utilize [facility] pharmacy for medication synchronization, packaging and delivery . Patient self care activities - Over the next 90 days, patient will: o Focus on medication adherence  by pill pack o Take medications as prescribed o Report any questions or concerns to PharmD and/or provider(s)  Please see past updates related to this goal by clicking on the "Past Updates" button in the selected goal      The patient verbalized understanding of instructions, educational materials, and care plan provided today and declined offer to receive copy of patient instructions, educational materials, and care plan.  Telephone follow up appointment with pharmacy team member scheduled for: 6 months  Charlene Brooke, PharmD, [facility] Clinical Pharmacist [facility] [place] [phone]

## 2020-12-17 ENCOUNTER — Other Ambulatory Visit: Payer: Self-pay | Admitting: Internal Medicine

## 2020-12-23 ENCOUNTER — Telehealth: Payer: Self-pay | Admitting: Pharmacist

## 2020-12-23 NOTE — Progress Notes (Signed)
Chronic Care Management Pharmacy Assistant   Name: Brenda [name]  MRN: [id number] DOB: 09-23-1955   Reason for Encounter: Hypertension Disease States Call   Conditions to be addressed/monitored: HTN   Recent office visits:  None ID  Recent consult visits:  None ID  Hospital visits:  None in previous 6 months  Medications: Outpatient Encounter Medications as of 12/23/2020  Medication Sig  . amLODipine (NORVASC) 10 MG tablet Take 1 tablet (10 mg total) by mouth daily.  Marland Kitchen aspirin 81 MG tablet Take 1 tablet (81 mg total) by mouth daily. With Food  . carboxymethylcellulose (REFRESH PLUS) 0.5 % SOLN Apply to eye.  . Cholecalciferol (VITAMIN D-3) 1000 units CAPS Take 2,000 Units by mouth daily.  Marland Kitchen dicyclomine (BENTYL) 20 MG tablet Take 1 tablet (20 mg total) by mouth 2 (two) times daily.  Marland Kitchen FIBER ADULT GUMMIES PO Take by mouth.  . gabapentin (NEURONTIN) 300 MG capsule Take 1 capsule (300 mg total) by mouth 3 (three) times daily as needed (nerve pain).  . metoprolol succinate (TOPROL-XL) 100 MG 24 hr tablet TAKE 1 TABLET BY MOUTH ONCE DAILY WITH OR IMMEDIATELY FOLLOWING MEALS.  . montelukast (SINGULAIR) 10 MG tablet Take 1 tablet (10 mg total) by mouth at bedtime.  . Multiple Vitamins-Calcium (ONE-A-DAY WOMENS PO) Take 1 tablet by mouth daily.  . nitroGLYCERIN (NITROSTAT) 0.4 MG SL tablet Place 1 tablet (0.4 mg total) under the tongue every 5 (five) minutes as needed for chest pain. 1 tablet under tongue every 5 minutes as needed for chest discomfort (max 2 Tab/day)  . Potassium Chloride ER 20 MEQ TBCR TAKE 1 TABLET BY MOUTH ON MONDAY,WEDNESDAYS,FRIDAYS AND SUNDAYS  . rosuvastatin (CRESTOR) 40 MG tablet Take 1 tablet (40 mg total) by mouth daily.   No facility-administered encounter medications on file as of 12/23/2020.    Reviewed chart prior to disease state call. Spoke with patient regarding BP  Recent Office Vitals: BP Readings from Last 3 Encounters:  06/25/20 100/60   05/04/20 112/78  04/15/20 102/74   Pulse Readings from Last 3 Encounters:  06/25/20 (!) 58  05/04/20 92  04/15/20 (!) 58    Wt Readings from Last 3 Encounters:  06/25/20 150 lb 6.4 oz (68.2 kg)  05/04/20 149 lb (67.6 kg)  04/15/20 150 lb (68 kg)     Kidney Function Lab Results  Component Value Date/Time   CREATININE 0.92 06/25/2020 12:50 PM   CREATININE 1.00 09/04/2019 08:27 AM   CREATININE 1.14 (H) 10/08/2018 11:09 AM   CREATININE 1.04 (H) 01/31/2017 09:15 AM   CREATININE 1.01 (H) 01/01/2017 02:38 PM   GFR 57.22 (L) 07/03/2019 01:33 PM   GFRNONAA >60 06/25/2020 12:50 PM   GFRNONAA 51 (L) 10/08/2018 11:09 AM   GFRAA 68 09/04/2019 08:27 AM   GFRAA 59 (L) 10/08/2018 11:09 AM    BMP Latest Ref Rng & Units 06/25/2020 09/04/2019 08/08/2019  Glucose 70 - 99 mg/dL 104(H) 114(H) 117(H)  BUN 8 - 23 mg/dL 17 18 36(H)  Creatinine 0.44 - 1.00 mg/dL 0.92 1.00 1.45(H)  BUN/Creat Ratio 12 - 28 - 18 -  Sodium 135 - 145 mmol/L 138 139 135  Potassium 3.5 - 5.1 mmol/L 3.6 4.1 3.7  Chloride 98 - 111 mmol/L 106 101 104  CO2 22 - 32 mmol/L 27 20 18(L)  Calcium 8.9 - 10.3 mg/dL 9.4 9.8 9.9    . Current antihypertensive regimen:  ? Amlodipine 10 mg daily ? Metoprolol succinate 100 mg daily  .  How often are you checking your Blood Pressure? The patient she takes her blood pressure everyday at home  . Current home BP readings:Patient states that her blood pressure ranges from 109-123/60-70 before she takes her medication  . What recent interventions/DTPs have been made by any provider to improve Blood Pressure control since last CPP Visit: Check BP as needed and log readings  . Any recent hospitalizations or ED visits since last visit with CPP? None ID  . What diet changes have been made to improve Blood Pressure Control? The patient states that she is eating a healthy diet but does have a soda and chips at time  . What exercise is being done to improve your Blood Pressure Control?   The patient states that she walks, yoga and biking about 3-4 times a week  Adherence Review: Is the patient currently on ACE/ARB medication? Yes, Rosuvastatin  Does the patient have >5 day gap between last estimated fill dates? No   Star Rating Drugs: Rosuvastatin 11/09/20 90 ds   [place] Pharmacist Assistant [phone]  Time spent:30

## 2021-01-13 ENCOUNTER — Telehealth: Payer: Self-pay | Admitting: Pharmacist

## 2021-01-13 NOTE — Progress Notes (Signed)
Chronic Care Management Pharmacy Assistant   Name: Brenda [name]  MRN: [id number] DOB: 1956-06-24  Reason for Encounter: Disease State-Hypertension   Recent office visits:  None noted  Recent consult visits:  None noted  Hospital visits:  None in previous 6 months  Medications: Outpatient Encounter Medications as of 01/13/2021  Medication Sig  . amLODipine (NORVASC) 10 MG tablet Take 1 tablet (10 mg total) by mouth daily.  Marland Kitchen aspirin 81 MG tablet Take 1 tablet (81 mg total) by mouth daily. With Food  . carboxymethylcellulose (REFRESH PLUS) 0.5 % SOLN Apply to eye.  . Cholecalciferol (VITAMIN D-3) 1000 units CAPS Take 2,000 Units by mouth daily.  Marland Kitchen dicyclomine (BENTYL) 20 MG tablet Take 1 tablet (20 mg total) by mouth 2 (two) times daily.  Marland Kitchen FIBER ADULT GUMMIES PO Take by mouth.  . gabapentin (NEURONTIN) 300 MG capsule Take 1 capsule (300 mg total) by mouth 3 (three) times daily as needed (nerve pain).  . metoprolol succinate (TOPROL-XL) 100 MG 24 hr tablet TAKE 1 TABLET BY MOUTH ONCE DAILY WITH OR IMMEDIATELY FOLLOWING MEALS.  . montelukast (SINGULAIR) 10 MG tablet Take 1 tablet (10 mg total) by mouth at bedtime.  . Multiple Vitamins-Calcium (ONE-A-DAY WOMENS PO) Take 1 tablet by mouth daily.  . nitroGLYCERIN (NITROSTAT) 0.4 MG SL tablet Place 1 tablet (0.4 mg total) under the tongue every 5 (five) minutes as needed for chest pain. 1 tablet under tongue every 5 minutes as needed for chest discomfort (max 2 Tab/day)  . Potassium Chloride ER 20 MEQ TBCR TAKE 1 TABLET BY MOUTH ON MONDAY,WEDNESDAYS,FRIDAYS AND SUNDAYS  . rosuvastatin (CRESTOR) 40 MG tablet Take 1 tablet (40 mg total) by mouth daily.   No facility-administered encounter medications on file as of 01/13/2021.    Reviewed chart prior to disease state call. Spoke with patient regarding BP  Recent Office Vitals: BP Readings from Last 3 Encounters:  06/25/20 100/60  05/04/20 112/78  04/15/20 102/74   Pulse Readings  from Last 3 Encounters:  06/25/20 (!) 58  05/04/20 92  04/15/20 (!) 58    Wt Readings from Last 3 Encounters:  06/25/20 150 lb 6.4 oz (68.2 kg)  05/04/20 149 lb (67.6 kg)  04/15/20 150 lb (68 kg)     Kidney Function Lab Results  Component Value Date/Time   CREATININE 0.92 06/25/2020 12:50 PM   CREATININE 1.00 09/04/2019 08:27 AM   CREATININE 1.14 (H) 10/08/2018 11:09 AM   CREATININE 1.04 (H) 01/31/2017 09:15 AM   CREATININE 1.01 (H) 01/01/2017 02:38 PM   GFR 57.22 (L) 07/03/2019 01:33 PM   GFRNONAA >60 06/25/2020 12:50 PM   GFRNONAA 51 (L) 10/08/2018 11:09 AM   GFRAA 68 09/04/2019 08:27 AM   GFRAA 59 (L) 10/08/2018 11:09 AM    BMP Latest Ref Rng & Units 06/25/2020 09/04/2019 08/08/2019  Glucose 70 - 99 mg/dL 104(H) 114(H) 117(H)  BUN 8 - 23 mg/dL 17 18 36(H)  Creatinine 0.44 - 1.00 mg/dL 0.92 1.00 1.45(H)  BUN/Creat Ratio 12 - 28 - 18 -  Sodium 135 - 145 mmol/L 138 139 135  Potassium 3.5 - 5.1 mmol/L 3.6 4.1 3.7  Chloride 98 - 111 mmol/L 106 101 104  CO2 22 - 32 mmol/L 27 20 18(L)  Calcium 8.9 - 10.3 mg/dL 9.4 9.8 9.9    . Current antihypertensive regimen:  ? Amlodipine 10 mg daily ? Metoprolol succinate 100 mg daily  . How often are you checking your Blood Pressure? daily   .  Current home BP readings:  . 5/1 125/67 . 5/3 142/81 . 5/5 139/85 . 5/6 122/77 . 5/7  95/78 . 5/8  134/77 . 5/11 133/73   . What recent interventions/DTPs have been made by any provider to improve Blood Pressure control since last CPP Visit:   None noted  . Any recent hospitalizations or ED visits since last visit with CPP? Yes    . What diet changes have been made to improve Blood Pressure Control?  Patient states she try to eat healthy, she mostly eats salmon, salad, baked chicken and veggies. She occasionally drinks Cokes and love to eat snacks.  . What exercise is being done to improve your Blood Pressure Control?  o Patient states she is very active, she walks every morning.  She attends [employer] for some exercise classes and the [employer] for yoga.  Adherence Review: Is the patient currently on ACE/ARB medication? Yes   Does the patient have >5 day gap between last estimated fill dates? No ? Amlodipine - 12/03/20 90D ? Metoprolol succinate - 11/19/20 90D   Star Rating Drugs: Rosuvastatin 11/09/20 [place], RMA Clinical Pharmacists Assistant [phone]  Time Spent: 1

## 2021-01-19 ENCOUNTER — Telehealth: Payer: Self-pay | Admitting: Pharmacist

## 2021-01-19 NOTE — Progress Notes (Signed)
error 

## 2021-01-31 ENCOUNTER — Encounter: Payer: Self-pay | Admitting: Neurology

## 2021-01-31 ENCOUNTER — Telehealth: Payer: Self-pay | Admitting: Neurology

## 2021-01-31 ENCOUNTER — Other Ambulatory Visit: Payer: Self-pay

## 2021-01-31 ENCOUNTER — Ambulatory Visit (INDEPENDENT_AMBULATORY_CARE_PROVIDER_SITE_OTHER): Payer: Medicare Other | Admitting: Neurology

## 2021-01-31 VITALS — BP 124/72 | HR 58 | Ht 65.0 in | Wt 150.0 lb

## 2021-01-31 DIAGNOSIS — I739 Peripheral vascular disease, unspecified: Secondary | ICD-10-CM | POA: Insufficient documentation

## 2021-01-31 DIAGNOSIS — R41842 Visuospatial deficit: Secondary | ICD-10-CM

## 2021-01-31 DIAGNOSIS — I2510 Atherosclerotic heart disease of native coronary artery without angina pectoris: Secondary | ICD-10-CM | POA: Diagnosis not present

## 2021-01-31 DIAGNOSIS — R4189 Other symptoms and signs involving cognitive functions and awareness: Secondary | ICD-10-CM | POA: Diagnosis not present

## 2021-01-31 DIAGNOSIS — G4719 Other hypersomnia: Secondary | ICD-10-CM | POA: Insufficient documentation

## 2021-01-31 DIAGNOSIS — I69911 Memory deficit following unspecified cerebrovascular disease: Secondary | ICD-10-CM

## 2021-01-31 DIAGNOSIS — K8689 Other specified diseases of pancreas: Secondary | ICD-10-CM

## 2021-01-31 DIAGNOSIS — R0683 Snoring: Secondary | ICD-10-CM | POA: Insufficient documentation

## 2021-01-31 DIAGNOSIS — Z8673 Personal history of transient ischemic attack (TIA), and cerebral infarction without residual deficits: Secondary | ICD-10-CM

## 2021-01-31 DIAGNOSIS — E039 Hypothyroidism, unspecified: Secondary | ICD-10-CM

## 2021-01-31 NOTE — Progress Notes (Signed)
SLEEP MEDICINE CLINIC    Provider:  Larey Seat, MD  Primary Care Physician:  [name], MD [place] [address]     Referring Provider: Reynold Bowen, [place],  [place] [address]          Chief Complaint according to patient   Patient presents with:    . New Patient (Initial Visit)           HISTORY OF PRESENT ILLNESS:  Brenda [name] is a 65 y.o. year old Native ( [place]) and [place] American female patient seen here upon referral on 01/31/2021 from Dr Forde Dandy, her PCP,  Primarily for a memory evaluation.   Chief concern according to patient :  I don't sleep well, I have to go to the gym in AM and take me meds after the Gym, at night I can't sleep as well without medication.  She watches TV after 11 Pm if she wakes up.     I have the pleasure of seeing Brenda [name] today, a right-handed African-American female with a possible sleep disorder.  She   has a past medical history of Alcoholism ([place]), Allergy, Anemia, Anxiety, Breast cancer, left [facility]) (oncologist-- Dr Jana Hakim, lumpectomy in 1999- double mastectomy in 2020), Carotid artery stenosis, CHF (congestive heart failure) ([place]), Chronic stable angina ([place]), Complication of anesthesia, Coronary artery disease (cardiologist-- dr Stanford Breed), Depression, Family history of lung cancer, History of blood transfusion, History of CVA with residual deficit, History of Helicobacter pylori infection (2001), Hyperlipidemia, Hypertension, Hyperthyroidism, Mild mitral regurgitation, Peripheral vascular disease ([place]), PONV (postoperative nausea and vomiting), S/P CABG x 5 (06-12-2000   by dr Lucianne Lei tright @[facility] ), and CHF with Stroke ([place]) (2002).  She was forced to leave her job in 2002-2003 after stroke, has had memory testing at that time.  Disability.     Family medical /sleep history: Brac gene carrier- breast cancer/ no other family member with OSA, insomnia, sleep walkers.    Social  history: she hand her sibling were 49 and 44 when her mother died, raised by aunts, [place]-  Patient is  retired from [employer], .  and lives in a household with  alone. Family status is unmarried with one adult daughter, Madelynn Done, 67 years-old .  Tobacco use: 2002 quit .  ETOH use quit in 1998- untreated depression at the time,  Caffeine intake in form of Coffee( 2 cups in AM ) Soda( may be one ). Regular exercise in form of Yoga, cycling, body pump.      Sleep habits are as follows: The patient's dinner time is between 5-7 PM. The patient goes to bed at 9-10 PM  Often after she fell asleep already on the couch- and continues to sleep for intervals of 1-2 hours, wakes for 1-2 bathroom breaks.The preferred sleep position is supine , with the support of 2 pillows. Dreams are reportedly rare. She feels often anxious.  6 AM is the usual rise time. The patient wakes up spontaneously at about 5.30.  She reports not feeling refreshed or restored in AM, with symptoms such as dry mouth and rarely  headaches, and residual fatigue.  Naps are taken in frequently, lasting from 15-30 minutes after she takes afternoon medications and are more refreshing than nocturnal sleep.    Review of Systems: Out of a complete 14 system review, the patient complains of only the following symptoms, and all other reviewed systems are negative.:  CJF, stroke, radiation to the breast and lumpectomy,  than a mastectomy.   Fatigue, sleepiness , snoring, fragmented sleep, Insomnia.  G D S - 3/ 15 points.   Loneliness.  Memory loss.    How likely are you to doze in the following situations: 0 = not likely, 1 = slight chance, 2 = moderate chance, 3 = high chance   Sitting and Reading? Watching Television? Sitting inactive in a public place (theater or meeting)? As a passenger in a car for an hour without a break? Lying down in the afternoon when circumstances permit? Sitting and talking to  someone? Sitting quietly after lunch without alcohol? In a car, while stopped for a few minutes in traffic?   Total = 12/ 24 points   FSS endorsed at 38/ 63 points.    >mmse Montreal Cognitive Assessment  01/31/2021  Visuospatial/ Executive (0/5) 3  Naming (0/3) 3  Attention: Read list of digits (0/2) 1  Attention: Read list of letters (0/1) 1  Attention: Serial 7 subtraction starting at 100 (0/3) 2  Language: Repeat phrase (0/2) 2  Language : Fluency (0/1) 1  Abstraction (0/2) 2  Delayed Recall (0/5) 2  Orientation (0/6) 6  Total 23   I would have not given her the point for cube drawing.   mixes up appointments.  Unfocussed.    Social History   Socioeconomic History  . Marital status: Single    Spouse name: Not on file  . Number of children: 1  . Years of education: Not on file  . Highest education level: Not on file  Occupational History  . 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 Smoking status: Former Smoker    Years: 17.00    Types: Cigarettes    Quit date: 09/11/2001    Years since quitting: 19.4  . Smokeless tobacco: Never Used  Vaping Use  . Vaping Use: Never used  Substance and Sexual Activity  . Alcohol use: Not Currently    Comment: Quit 1998  . Drug use: No  . Sexual activity: Not on file    Comment: Hysterectomy  Other Topics Concern  . Not on file  Social History Narrative  . Not on file   Social Determinants of Health   Financial Resource Strain: Low Risk   . Difficulty of Paying Living Expenses: Not hard at all  Food Insecurity: Not on file  Transportation Needs: Not on file  Physical Activity: Not on file  Stress: Not on file  Social Connections: Not on file    Family History  Problem Relation Age of Onset  . Lung cancer Mother 49       died at an early age  . Other Other        There is no Hx of premature coronary artery disease in her family  . Colon cancer Neg Hx   . Rectal cancer  Neg Hx   . Stomach cancer Neg Hx     Past Medical History:  Diagnosis Date  . Alcoholism ([place])    Quit 1998  . Allergy   . Anemia   . Anxiety   . 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 Carotid artery stenosis  bilateral---  04-27-2000  s/p  left carotid endarterectomy:  last doppler in epic 08-13-2018  right ICA 40-59%,  right ECA >50%,  left ICA 1-39% ,  bilateral subclavians stenotic  . CHF (congestive heart failure) ([place])   . Chronic stable angina ([facility])    followed by cardiology  . Complication of anesthesia    PONV  . Coronary artery disease cardiologist-- dr Stanford Breed   06-12-2000  s/p  CABG x5  :  cardiac cath 02-21-2018 --  normal LVF, 80% LM, occluded RCA, 95% Ramus and 100% OM,  Patent seqSVG to  PLA-PDA & LIMA to LAD,  seqSVG to OM and RI occluded  . Depression   . Family history of lung cancer   . History of blood transfusion   . History of CVA with residual deficit    short term memory loss per pt  . History of Helicobacter pylori infection 2001  . Hyperlipidemia   . Hypertension   . Hyperthyroidism    dx yrs ago, per pt have taken medication for awhile  . Mild mitral regurgitation    per echo 06/ 2019  . Peripheral vascular disease ([place])    managed by vascular-- dr Donzetta Matters  . PONV (postoperative nausea and vomiting)   . S/P CABG x 5 06-12-2000   by dr Lucianne Lei tright @[facility]   . Stroke [facility]) 2002   mild memory loss    Past Surgical History:  Procedure Laterality Date  . ABDOMINAL AORTOGRAM W/LOWER EXTREMITY N/A 06/13/2017   Procedure: ABDOMINAL AORTOGRAM W/LOWER EXTREMITY;  Surgeon: Waynetta Sandy, MD;  Location: [place] [facility];  Service: Cardiovascular;  Laterality: N/A;  Bilateral  . ABDOMINAL HYSTERECTOMY  1998  . BREAST  LUMPECTOMY WITH AXILLARY LYMPH NODE DISSECTION Left 1999  . CARDIAC CATHETERIZATION  04-24-2000  dr Caryl Comes   3V CAD , subtotal PDA w/ slow flow colleterals , ef 48%  . CARDIAC CATHETERIZATION  06/11/2000   3V CAD with progression of LAD disease  . CAROTID ENDARTERECTOMY Left 04-27-2000   dr Amedeo Plenty  @[facility]   . COLONOSCOPY  06/24/2019   [facility]  . CORONARY ARTERY BYPASS GRAFT  06-12-2000   dr Lucianne Lei tright @[facility]    x 5 (left internal mammary artery to left anterior  descending coronary artery  . LEFT HEART CATH AND CORS/GRAFTS ANGIOGRAPHY N/A 02/21/2018   Procedure: LEFT HEART CATH AND CORS/GRAFTS ANGIOGRAPHY;  Surgeon: Lorretta Harp, MD;  Location: [place] [facility];  Service: Cardiovascular;  Laterality: N/A;  . PERIPHERAL VASCULAR INTERVENTION  06/13/2017   Procedure: PERIPHERAL VASCULAR INTERVENTION;  Surgeon: Waynetta Sandy, MD;  Location: [place] [facility];  Service: Cardiovascular;;  Bilateral Iliac Stents  . ROBOTIC ASSISTED SALPINGO OOPHERECTOMY Bilateral 03/13/2019   Procedure: XI ROBOTIC ASSISTED BILATERAL  SALPINGO OOPHORECTOMY;  Surgeon: Everitt Amber, MD;  Location: [facility];  Service: Gynecology;  Laterality: Bilateral;  . TOTAL MASTECTOMY Bilateral 11/07/2018   Procedure: BILATERAL MASTECTOMIES;  Surgeon: Stark Klein, MD;  Location: [place];  Service: General;  Laterality: Bilateral;     Current Outpatient Medications on File Prior to Visit  Medication Sig Dispense Refill  . amLODipine (NORVASC) 10 MG tablet Take 1 tablet (10 mg total) by mouth daily. 90 tablet 3  . aspirin 81 MG tablet Take 1 tablet (81 mg total) by mouth daily. With Food 30 tablet 2  . carboxymethylcellulose (REFRESH PLUS) 0.5 % SOLN Apply to eye.    . Cholecalciferol (VITAMIN D-3) 1000 units CAPS Take 2,000  Units by mouth daily.    Marland Kitchen FIBER ADULT GUMMIES PO Take by mouth.    . gabapentin (NEURONTIN) 300 MG capsule Take 1 capsule (300 mg total) by mouth 3 (three) times daily  as needed (nerve pain). 90 capsule 3  . metoprolol succinate (TOPROL-XL) 100 MG 24 hr tablet TAKE 1 TABLET BY MOUTH ONCE DAILY WITH OR IMMEDIATELY FOLLOWING MEALS. 90 tablet 4  . nitroGLYCERIN (NITROSTAT) 0.4 MG SL tablet Place 1 tablet (0.4 mg total) under the tongue every 5 (five) minutes as needed for chest pain. 1 tablet under tongue every 5 minutes as needed for chest discomfort (max 2 Tab/day) 25 tablet 4  . Potassium Chloride ER 20 MEQ TBCR TAKE 1 TABLET BY MOUTH ON MONDAY,WEDNESDAYS,FRIDAYS AND SUNDAYS 30 tablet 0  . rosuvastatin (CRESTOR) 40 MG tablet Take 1 tablet (40 mg total) by mouth daily. 90 tablet 3   No current facility-administered medications on file prior to visit.    Allergies  Allergen Reactions  . Adhesive [Tape] Itching  . Latex Rash    Physical exam:  Today's Vitals   01/31/21 1412  BP: 124/72  Pulse: (!) 58  Weight: 150 lb (68 kg)  Height: 5\' 5"  (1.651 m)   Body mass index is 24.96 kg/m.   Wt Readings from Last 3 Encounters:  01/31/21 150 lb (68 kg)  06/25/20 150 lb 6.4 oz (68.2 kg)  05/04/20 149 lb (67.6 kg)     Ht Readings from Last 3 Encounters:  01/31/21 5\' 5"  (1.651 m)  06/25/20 5\' 3"  (1.6 m)  05/04/20 5\' 3"  (1.6 m)      General: The patient is awake, alert and appears not in acute distress. The patient is well groomed. Head: Normocephalic, atraumatic. Neck is supple. Mallampati  3, dry mouth noted.  neck circumference:13 inches . Nasal airflow  patent.  Retrognathia is seen.  Dental status: poor.  Cardiovascular:  Regular rate and cardiac rhythm by pulse,  without distended neck veins. Respiratory: Lungs are clear to auscultation.  Skin:  Without evidence of ankle edema, or rash. Trunk: The patient's posture is erect.   Neurologic exam : The patient is awake and alert, oriented to place and time.   Memory subjective described as intact.  Attention span & concentration ability appears normal.  Speech is fluent,  without dysarthria,  dysphonia or aphasia.  Mood and affect are appropriate.   Montreal Cognitive Assessment Blind 01/31/2021  Attention: Read list of digits (0/2) 1  Attention: Read list of letters (0/1) 1  Attention: Serial 7 subtraction starting at 100 (0/3) 2  Language: Repeat phrase (0/2) 2  Language : Fluency (0/1) 1  Abstraction (0/2) 2  Delayed Recall (0/5) 2  Orientation (0/6) 6  Total 23    Cranial nerves: no loss of smell or taste reported  Pupils are equal and briskly reactive to light. Funduscopic exam deferred. .  Extraocular movements in vertical and horizontal planes were intact and without nystagmus. No Diplopia. She reports difficulties reading with her current glasses.  Visual fields by finger perimetry are limited.  Hearing was intact to soft voice and finger rubbing.   Facial sensation intact to fine touch.  Facial motor strength is symmetric and tongue and uvula move midline.  Neck ROM : rotation, tilt and flexion extension were normal for age and shoulder shrug was symmetrical.    Motor exam:  Symmetric bulk, tone and ROM.   Normal tone without cog wheeling, symmetric grip strength .   Sensory:  Fine touch  and vibration were normal. She reports right finger numbness and both feet, had PAD and has stents in the femoral artery .  Proprioception tested in the upper extremities was normal.   Coordination: Rapid alternating movements in the fingers/hands were of normal speed.  The Finger-to-nose maneuver was intact without evidence of ataxia, dysmetria or tremor.   Gait and station: Patient could rise unassisted from a seated position, walked without assistive device.  Stance is of normal width/ base and the patient turned with 3 steps.  Toe and heel walk were deferred.  Deep tendon reflexes: in the  upper and lower extremities are symmetric and intact.  Babinski response was deferred .     After spending a total time of 45 minutes face to face and additional time for physical  and neurologic examination, review of laboratory studies, there were no previous memory studies in EPIC.  personal review of imaging studies, reports and results of other testing and review of referral information / records as far as provided in visit, I have established the following assessments:  1)  Visio-spatial deficits notable on MOCA exam- this can be related t recent changes in vision.  Difficulties in reading would fit in here. GeT MRI brain - with and without. History of breast cancer, of CHF, of PAD.  2)  Thyroid disease- possible neuropathy to be checked also. Marland Kitchen  3) non restorative sleep, EDS, snoring and small airway anatomy    My Plan is to proceed with:  1) labs from dr [facility] office in march , not to be repeated.  2)  MRI brain  3) HST for sleep apnea screening.   I would like to thank [name], MD and Reynold Bowen, [place],  [place] [address] for allowing me to meet with and to take care of this pleasant patient.   In short, Brenda [name] is presenting with memory loss and sleepiness.   I plan to follow up either personally or through our NP within 3 month.     Electronically signed by: Larey Seat, MD 01/31/2021 2:36 PM  [facility] Board certified by The [employer] of Sleep Medicine and Diplomate of the [employer] of Sleep Medicine. Board certified In Neurology through the [place], Fellow of the [employer] of Neurology. Medical Director of [facility].

## 2021-01-31 NOTE — Telephone Encounter (Signed)
MRI auth: npr via [facility] website  Sent to GI for scheduling

## 2021-01-31 NOTE — Patient Instructions (Signed)
There are well-accepted and sensible ways to reduce risk for Alzheimers disease and other degenerative brain disorders .  Exercise Daily Walk A daily 20 minute walk should be part of your routine. Disease related apathy can be a significant roadblock to exercise and the only way to overcome this is to make it a daily routine and perhaps have a reward at the end (something your loved one loves to eat or drink perhaps) or a personal trainer coming to the home can also be very useful. Most importantly, the patient is much more likely to exercise if the caregiver / spouse does it with him/her. In general a structured, repetitive schedule is best.  General Health: Any diseases which effect your body will effect your brain such as a pneumonia, urinary infection, blood clot, heart attack or stroke. Keep contact with your primary care doctor for regular follow ups.  Sleep. A good nights sleep is healthy for the brain. Seven hours is recommended. If you have insomnia or poor sleep habits we can give you some instructions. If you have sleep apnea wear your mask.  Diet: Eating a heart healthy diet is also a good idea; fish and poultry instead of red meat, nuts (mostly non-peanuts), vegetables, fruits, olive oil or canola oil (instead of butter), minimal salt (use other spices to flavor foods), whole grain rice, bread, cereal and pasta and wine in moderation.Research is now showing that the MIND diet, which is a combination of The Mediterranean diet and the DASH diet, is beneficial for cognitive processing and longevity. Information about this diet can be found in The MIND Diet, a book by Doyne Keel, MS, RDN, and online at NotebookDistributors.si  Finances, Power of Attorney and Advance Directives: You should consider putting legal safeguards in place with regard to financial and medical decision making. While the spouse always has power of attorney for medical and financial issues in the  absence of any form, you should consider what you want in case the spouse / caregiver is no longer around or capable of making decisions.   The Alzheimers Association Position on Disease Prevention  Can Alzheimer's be prevented? It's a question that continues to intrigue researchers and fuel new investigations. There are no clear-cut answers yet -- partially due to the need for more large-scale studies in diverse populations -- but promising research is under way. The Alzheimer's Association is leading the worldwide effort to find a treatment for Alzheimer's, delay its onset and prevent it from developing.   What causes Alzheimer's? Experts agree that in the vast majority of cases, Alzheimer's, like other common chronic conditions, probably develops as a result of complex interactions among multiple factors, including age, genetics, environment, lifestyle and coexisting medical conditions. Although some risk factors -- such as age or genes -- cannot be changed, other risk factors -- such as high blood pressure and lack of exercise -- usually can be changed to help reduce risk. Research in these areas may lead to new ways to detect those at highest risk.  Prevention studies A small percentage of people with Alzheimer's disease (less than 1 percent) have an early-onset type associated with genetic mutations. Individuals who have these genetic mutations are guaranteed to develop the disease. An ongoing clinical trial conducted by the [facility] ([facility]), is testing whether antibodies to beta-amyloid can reduce the accumulation of beta-amyloid plaque in the brains of people with such genetic mutations and thereby reduce, delay or prevent symptoms. Participants in the trial are receiving antibodies (  or placebo) before they develop symptoms, and the development of beta-amyloid plaques is being monitored by brain scans and other tests.  Another clinical trial, known as the A4 trial  (Anti-Amyloid Treatment in Asymptomatic Alzheimer's), is testing whether antibodies to beta-amyloid can reduce the risk of Alzheimer's disease in older people (ages 31 to 22) at high risk for the disease. The A4 trial is being conducted by the Alzheimer's Disease Cooperative Study.  Though research is still evolving, evidence is strong that people can reduce their risk by making key lifestyle changes, including participating in regular activity and maintaining good heart health. Based on this research, the Alzheimer's Association offers 10 Ways to Love Your Brain -- a collection of tips that can reduce the risk of cognitive decline.  Heart-head connection  New research shows there are things we can do to reduce the risk of mild cognitive impairment and dementia.  Several conditions known to increase the risk of cardiovascular disease -- such as high blood pressure, diabetes and high cholesterol -- also increase the risk of developing Alzheimer's. Some autopsy studies show that as many as 60 percent of individuals with Alzheimer's disease also have cardiovascular disease.  A longstanding question is why some people develop hallmark Alzheimer's plaques and tangles but do not develop the symptoms of Alzheimer's. Vascular disease may help researchers eventually find an answer. Some autopsy studies suggest that plaques and tangles may be present in the brain without causing symptoms of cognitive decline unless the brain also shows evidence of vascular disease. More research is needed to better understand the link between vascular health and Alzheimer's.  Physical exercise and diet Regular physical exercise may be a beneficial strategy to lower the risk of Alzheimer's and vascular dementia. Exercise may directly benefit brain cells by increasing blood and oxygen flow in the brain. Because of its known cardiovascular benefits, a medically approved exercise program is a valuable part of any overall wellness  plan.  Current evidence suggests that heart-healthy eating may also help protect the brain. Heart-healthy eating includes limiting the intake of sugar and saturated fats and making sure to eat plenty of fruits, vegetables, and whole grains. No one diet is best. Two diets that have been studied and may be beneficial are the DASH (Dietary Approaches to Stop Hypertension) diet and the Mediterranean diet. The DASH diet emphasizes vegetables, fruits and fat-free or low-fat dairy products; includes whole grains, fish, poultry, beans, seeds, nuts and vegetable oils; and limits sodium, sweets, sugary beverages and red meats. A Mediterranean diet includes relatively little red meat and emphasizes whole grains, fruits and vegetables, fish and shellfish, and nuts, olive oil and other healthy fats.  Social connections and intellectual activity A number of studies indicate that maintaining strong social connections and keeping mentally active as we age might lower the risk of cognitive decline and Alzheimer's. Experts are not certain about the reason for this association. It may be due to direct mechanisms through which social and mental stimulation strengthen connections between nerve cells in the brain.  Head trauma There appears to be a strong link between future risk of Alzheimer's and serious head trauma, especially when injury involves loss of consciousness. You can help reduce your risk of Alzheimer's by protecting your head. . Wear a seat belt . Use a helmet when participating in sports . "Fall-proof" your home .  What you can do now While research is not yet conclusive, certain lifestyle choices, such as physical activity and diet, may help support brain  health and prevent Alzheimer's. Many of these lifestyle changes have been shown to lower the risk of other diseases, like heart disease and diabetes, which have been linked to Alzheimer's. With few drawbacks and plenty of known benefits, healthy lifestyle  choices can improve your health and possibly protect your brain.  Learn more about brain health. You can help increase our knowledge by considering participation in a clinical study. Our free clinical trial matching services, TrialMatch, can help you find clinical trials in your area that are seeking volunteers.  Understanding prevention research Here are some things to keep in mind about the research underlying much of our current knowledge about possible prevention: . Insights about potentially modifiable risk factors apply to large population groups, not to individuals. Studies can show that factor X is associated with outcome Y, but cannot guarantee that any specific person will have that outcome. As a result, you can "do everything right" and still have a serious health problem or "do everything wrong" and live to be 100. . Much of our current evidence comes from large epidemiological studies such as the [place] Aging Study, the Nurses' Health Study, the Adult Changes in Thought Study and the [employer] These studies explore pre-existing behaviors and use statistical methods to relate those behaviors to health outcomes. This type of study can show an "association" between a factor and an outcome but cannot "prove" cause and effect. This is why we describe evidence based on these studies with such language as "suggests," "may show," "might protect," and "is associated with." . The gold standard for showing cause and effect is a clinical trial in which participants are randomly assigned to a prevention or risk management strategy or a control group. Researchers follow the two groups over time to see if their outcomes differ significantly. . It is unlikely that some prevention or risk management strategies will ever be tested in randomized trials for ethical or practical reasons. One example is exercise. Definitively testing the impact of exercise on Alzheimer's risk would require a huge  trial enrolling thousands of people and following them for many years. The expense and logistics of such a trial would be prohibitive, and it would require some people to go without exercise, a known health benefit.   Mild Neurocognitive Disorder Mild neurocognitive disorder, formerly known as mild cognitive impairment, is a disorder in which memory does not work as well as it should. This disorder may also cause problems with other mental functions, including thought, communication, behavior, and completion of tasks. These problems can be noticed and measured, but they usually do not interfere with daily activities or the ability to live independently. Mild neurocognitive disorder typically develops after 67 years of age, but it can also develop at younger ages. It is not as serious as major neurocognitive disorder, also known as dementia, but it may be the first sign of it. Generally, symptoms of this condition get worse over time. In rare cases, symptoms can get better. What are the causes? This condition may be caused by:  Brain disorders like Alzheimer's disease, Parkinson's disease, and other conditions that gradually damage nerve cells (neurodegenerative conditions).  Diseases that affect blood vessels in the brain and result in small strokes.  Certain infections, such as HIV.  Traumatic brain injury.  Other medical conditions, such as brain tumors, underactive thyroid (hypothyroidism), and vitamin B12 deficiency.  Use of certain drugs or prescription medicines. What increases the risk? The following factors may make you more likely to develop this  condition:  Being older than 65 years.  Being female.  Low education level.  Diabetes, high blood pressure, high cholesterol, and other conditions that increase the risk for blood vessel diseases.  Untreated or undertreated sleep apnea.  Having a certain type of gene that can be passed from parent to child (inherited).  Chronic  health problems such as heart disease, lung disease, liver disease, kidney disease, or depression. What are the signs or symptoms? Symptoms of this condition include:  Difficulty remembering. You may: ? Forget names, phone numbers, or details of recent events. ? Forget social events and appointments. ? Repeatedly forget where you put your car keys or other items.  Difficulty thinking and solving problems. You may have trouble with complex tasks, such as: ? Paying bills. ? Driving in unfamiliar places.  Difficulty communicating. You may have trouble: ? Finding the right word or naming an object. ? Forming a sentence that makes sense, or understanding what you read or hear.  Changes in your behavior or personality. When this happens, you may: ? Lose interest in the things that you used to enjoy. ? Withdraw from social situations. ? Get angry more easily than usual. ? Act before thinking. How is this diagnosed? This condition is diagnosed based on:  Your symptoms. Your health care provider may ask you and the people you spend time with, such as family and friends, about your symptoms.  Evaluation of mental functions (neuropsychological testing). Your health care provider may refer you to a neurologist or mental health specialist to evaluate your mental functions in detail. To identify the cause of your condition, your health care provider may:  Get a detailed medical history.  Ask about use of alcohol, drugs, and prescription medicines.  Do a physical exam.  Order blood tests and brain imaging exams. How is this treated? Mild neurocognitive disorder that is caused by medicine use, drug use, infection, or another medical condition may improve when the cause is treated, or when medicines or drugs are stopped. If this disorder has another cause, it generally does not improve and may get worse. In these cases, the goal of treatment is to help you manage the loss of mental function.  Treatments in these cases include:  Medicine. Medicine mainly helps memory and behavior symptoms.  Talk therapy. Talk therapy provides education, emotional support, memory aids, and other ways of making up for problems with mental function.  Lifestyle changes, including: ? Getting regular exercise. ? Eating a healthy diet that includes omega-3 fatty acids. ? Challenging your thinking and memory skills. ? Having more social interaction. Follow these instructions at home: Eating and drinking  Drink enough fluid to keep your urine pale yellow.  Eat a healthy diet that includes omega-3 fatty acids. These can be found in: ? Fish. ? Nuts. ? Leafy vegetables. ? Vegetable oils.  If you drink alcohol: ? Limit how much you use to:  0-1 drink a day for women.  0-2 drinks a day for men. ? Be aware of how much alcohol is in your drink. In the [place] one drink equals one 12 oz bottle of beer (355 mL), one 5 oz glass of wine (148 mL), or one 1 oz glass of hard liquor (44 mL).   Lifestyle  Get regular exercise as told by your health care provider.  Do not use any products that contain nicotine or tobacco, such as cigarettes, e-cigarettes, and chewing tobacco. If you need help quitting, ask your health care provider.  Practice  ways to manage stress. If you need help managing stress, ask your health care provider.  Continue to have social interaction.  Keep your mind active with stimulating activities you enjoy, such as reading or playing games.  Make sure to get quality sleep. Follow these tips: ? Avoid napping during the day. ? Keep your sleeping area dark and cool. ? Avoid exercising during the few hours before you go to bed. ? Avoid caffeine products in the evening.   General instructions  Take over-the-counter and prescription medicines only as told by your health care provider. Your health care provider may recommend that you avoid taking medicines that can affect thinking, such  as pain medicines or sleep medicines.  Work with your health care provider to find out what you need help with and what your safety needs are.  Keep all follow-up visits. This is important. Where to find more information  [employer] on Aging: http://kim-miller.com/ Contact a health care provider if:  You have any new symptoms. Get help right away if:  You develop new confusion or your confusion gets worse.  You act in ways that place you or your family in danger. Summary  Mild neurocognitive disorder is a disorder in which memory does not work as well as it should.  Mild neurocognitive disorder can have many causes. It may be the first stage of dementia.  To manage your condition, get regular exercise, keep your mind active, get quality sleep, and eat a healthy diet. This information is not intended to replace advice given to you by your health care provider. Make sure you discuss any questions you have with your health care provider. Document Revised: 01/12/2020 Document Reviewed: 01/12/2020 Elsevier Patient Education  [place].

## 2021-02-01 NOTE — Progress Notes (Signed)
Normal blood cell count, high albumin level, normalized creatinine, elevated HbAic at 5.8, elevated sed rate ( inflammatory marker) and normal CRP . Normal TSH ( thyroid) and vitamin B 12 level.

## 2021-02-02 ENCOUNTER — Other Ambulatory Visit: Payer: Self-pay

## 2021-02-02 ENCOUNTER — Ambulatory Visit
Admission: RE | Admit: 2021-02-02 | Discharge: 2021-02-02 | Disposition: A | Discharge status: Home / Self Care | Payer: Medicare Other | Source: Ambulatory Visit | Attending: Neurology | Admitting: Neurology

## 2021-02-02 DIAGNOSIS — E039 Hypothyroidism, unspecified: Secondary | ICD-10-CM

## 2021-02-02 DIAGNOSIS — R0683 Snoring: Secondary | ICD-10-CM

## 2021-02-02 DIAGNOSIS — I69911 Memory deficit following unspecified cerebrovascular disease: Secondary | ICD-10-CM

## 2021-02-02 DIAGNOSIS — I739 Peripheral vascular disease, unspecified: Secondary | ICD-10-CM

## 2021-02-02 DIAGNOSIS — K8689 Other specified diseases of pancreas: Secondary | ICD-10-CM

## 2021-02-02 DIAGNOSIS — Z8673 Personal history of transient ischemic attack (TIA), and cerebral infarction without residual deficits: Secondary | ICD-10-CM

## 2021-02-02 DIAGNOSIS — G4719 Other hypersomnia: Secondary | ICD-10-CM

## 2021-02-02 DIAGNOSIS — I2510 Atherosclerotic heart disease of native coronary artery without angina pectoris: Secondary | ICD-10-CM

## 2021-02-02 LAB — ANA W/REFLEX: ANA Titer 1: NEGATIVE

## 2021-02-02 MED ORDER — GADOBENATE DIMEGLUMINE 529 MG/ML IV SOLN
15.0000 mL | Freq: Once | INTRAVENOUS | Status: AC | PRN
  Administered 2021-02-02: 15 mL via INTRAVENOUS

## 2021-02-03 NOTE — Progress Notes (Signed)
Negative ANA.

## 2021-02-06 ENCOUNTER — Encounter: Payer: Self-pay | Admitting: Neurology

## 2021-02-07 LAB — CBC WITH DIFFERENTIAL/PLATELET
Basophils Absolute: 0 10*3/uL (ref 0.0–0.2)
Basos: 1 %
EOS (ABSOLUTE): 0.1 10*3/uL (ref 0.0–0.4)
Eos: 1 %
Hematocrit: 43.2 % (ref 34.0–46.6)
Hemoglobin: 14.7 g/dL (ref 11.1–15.9)
Immature Grans (Abs): 0 10*3/uL (ref 0.0–0.1)
Immature Granulocytes: 0 %
Lymphocytes Absolute: 2.3 10*3/uL (ref 0.7–3.1)
Lymphs: 32 %
MCH: 32.7 pg (ref 26.6–33.0)
MCHC: 34 g/dL (ref 31.5–35.7)
MCV: 96 fL (ref 79–97)
Monocytes Absolute: 0.6 10*3/uL (ref 0.1–0.9)
Monocytes: 8 %
Neutrophils Absolute: 4.2 10*3/uL (ref 1.4–7.0)
Neutrophils: 58 %
Platelets: 162 10*3/uL (ref 150–450)
RBC: 4.5 x10E6/uL (ref 3.77–5.28)
RDW: 12.1 % (ref 11.7–15.4)
WBC: 7.1 10*3/uL (ref 3.4–10.8)

## 2021-02-07 LAB — COMPREHENSIVE METABOLIC PANEL
ALT: 29 IU/L (ref 0–32)
AST: 33 IU/L (ref 0–40)
Albumin/Globulin Ratio: 1.7 (ref 1.2–2.2)
Albumin: 5.1 g/dL — ABNORMAL HIGH (ref 3.8–4.8)
Alkaline Phosphatase: 106 IU/L (ref 44–121)
BUN/Creatinine Ratio: 15 (ref 12–28)
BUN: 13 mg/dL (ref 8–27)
Bilirubin Total: 0.9 mg/dL (ref 0.0–1.2)
CO2: 20 mmol/L (ref 20–29)
Calcium: 9.9 mg/dL (ref 8.7–10.3)
Chloride: 100 mmol/L (ref 96–106)
Creatinine, Ser: 0.85 mg/dL (ref 0.57–1.00)
Globulin, Total: 3 g/dL (ref 1.5–4.5)
Glucose: 76 mg/dL (ref 65–99)
Potassium: 4.4 mmol/L (ref 3.5–5.2)
Sodium: 141 mmol/L (ref 134–144)
Total Protein: 8.1 g/dL (ref 6.0–8.5)
eGFR: 75 mL/min/{1.73_m2} (ref >59)

## 2021-02-07 LAB — MULTIPLE MYELOMA PANEL, SERUM
Albumin SerPl Elph-Mcnc: 4.3 g/dL (ref 2.9–4.4)
Albumin/Glob SerPl: 1.2 (ref 0.7–1.7)
Alpha 1: 0.3 g/dL (ref 0.0–0.4)
Alpha2 Glob SerPl Elph-Mcnc: 0.8 g/dL (ref 0.4–1.0)
B-Globulin SerPl Elph-Mcnc: 1.3 g/dL (ref 0.7–1.3)
Gamma Glob SerPl Elph-Mcnc: 1.4 g/dL (ref 0.4–1.8)
Globulin, Total: 3.8 g/dL (ref 2.2–3.9)
IgA/Immunoglobulin A, Serum: 570 mg/dL — ABNORMAL HIGH (ref 87–352)
IgG (Immunoglobin G), Serum: 1391 mg/dL (ref 586–1602)
IgM (Immunoglobulin M), Srm: 52 mg/dL (ref 26–217)

## 2021-02-07 LAB — C-REACTIVE PROTEIN: CRP: 2 mg/L (ref 0–10)

## 2021-02-07 LAB — VITAMIN B1: Thiamine: 128.4 nmol/L (ref 66.5–200.0)

## 2021-02-07 LAB — HEMOGLOBIN A1C
Est. average glucose Bld gHb Est-mCnc: 120 mg/dL
Hgb A1c MFr Bld: 5.8 % — ABNORMAL HIGH (ref 4.8–5.6)

## 2021-02-07 LAB — TSH: TSH: 1.32 u[IU]/mL (ref 0.450–4.500)

## 2021-02-07 LAB — VITAMIN B12: Vitamin B-12: 1014 pg/mL (ref 232–1245)

## 2021-02-07 LAB — SEDIMENTATION RATE: Sed Rate: 50 mm/hr — ABNORMAL HIGH (ref 0–40)

## 2021-02-07 LAB — SJOGREN'S SYNDROME ANTIBODS(SSA + SSB)
ENA SSA (RO) Ab: 0.2 AI (ref 0.0–0.9)
ENA SSB (LA) Ab: <0.2 AI (ref 0.0–0.9)

## 2021-02-07 LAB — HEPATITIS B SURFACE ANTIBODY,QUALITATIVE: Hep B Surface Ab, Qual: REACTIVE

## 2021-02-08 ENCOUNTER — Encounter: Payer: Self-pay | Admitting: Neurology

## 2021-02-08 NOTE — Progress Notes (Signed)
CLINICAL HISTORY: 65 year old female with memory loss. IMPRESSION:   MRI brain with and without contrast demonstrating: -Mild periventricular and subcortical foci of nonspecific gliosis. -No acute findings  Penni Bombard, MD

## 2021-02-08 NOTE — Progress Notes (Signed)
Elevated HbA1c and polyclonal IGA was found. Elevated sed rate ( unpezcfic marker of inflammation)

## 2021-02-21 ENCOUNTER — Ambulatory Visit (INDEPENDENT_AMBULATORY_CARE_PROVIDER_SITE_OTHER): Payer: Medicare Other | Admitting: Neurology

## 2021-02-21 DIAGNOSIS — E039 Hypothyroidism, unspecified: Secondary | ICD-10-CM

## 2021-02-21 DIAGNOSIS — G4733 Obstructive sleep apnea (adult) (pediatric): Secondary | ICD-10-CM

## 2021-02-21 DIAGNOSIS — R0683 Snoring: Secondary | ICD-10-CM

## 2021-02-21 DIAGNOSIS — I739 Peripheral vascular disease, unspecified: Secondary | ICD-10-CM

## 2021-02-21 DIAGNOSIS — R4189 Other symptoms and signs involving cognitive functions and awareness: Secondary | ICD-10-CM

## 2021-02-21 DIAGNOSIS — G4719 Other hypersomnia: Secondary | ICD-10-CM

## 2021-02-21 DIAGNOSIS — K8689 Other specified diseases of pancreas: Secondary | ICD-10-CM

## 2021-02-21 DIAGNOSIS — R41842 Visuospatial deficit: Secondary | ICD-10-CM

## 2021-02-21 DIAGNOSIS — I2510 Atherosclerotic heart disease of native coronary artery without angina pectoris: Secondary | ICD-10-CM

## 2021-02-21 DIAGNOSIS — I69911 Memory deficit following unspecified cerebrovascular disease: Secondary | ICD-10-CM

## 2021-02-21 DIAGNOSIS — Z8673 Personal history of transient ischemic attack (TIA), and cerebral infarction without residual deficits: Secondary | ICD-10-CM

## 2021-02-22 NOTE — Progress Notes (Signed)
[facility] TEST (Watch PAT) REPORT  STUDY DATE: 02-21-21  DOB: 06-06-1956  MRN: [id number]  ORDERING CLINICIAN: Larey Seat, MD   REFERRING CLINICIAN: Roque Cash, MD   CLINICAL INFORMATION/HISTORY: Brenda [name] , a 65 year-old right-handed Native -American and African-American female, presented on 01-31-2021 upon a consultation request by Dr. Forde Dandy for a possible sleep disorder.  She reports excessive daytime sleepiness, memory decline, and Insomnia. She has a medical history of Alcoholism ([place]), Respiratory Allergy, Anemia with cancer treatment / Breast cancer, left [facility]) (oncologist-- Dr Jana Hakim, lumpectomy in 1999- double mastectomy in 2020), Carotid artery stenosis, CHF (congestive heart failure) ([place]), Chronic stable angina ([place]), Complication of anesthesia, Coronary artery disease (cardiologist-- dr Stanford Breed), Depression,  History of CVA with residual deficit, History of Helicobacter pylori infection (2001), Hyperlipidemia, Hypertension, Hyperthyroidism, Mitral regurgitation, Peripheral vascular disease ([place]), PONV (postoperative nausea and vomiting), 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 Tharon Aquas Tright @[facility] ), and CHF with Stroke [facility]) (2002).  Epworth sleepiness score: 12/24. FSS at 38/63 points, both elevated  BMI: 25.0 kg/m  Neck Cirfumerence: 13"  Sleep Summary:   Total Recording Time (hours, min): 6 h 50 min Total Sleep Time (hours, min):  6 h 3 min  Percent REM (%):    12.12%   Respiratory Indices (per hour):   Calculated pAHI (per hour):              41.2        REM pAHI:                                        53.2     NREM pAHI:                                   39.4 Supine AHI:                                      40.4   Oxygen Saturation Statistics (%):    Oxygen Saturation ,Mean:           95%  Nadir oxygen saturation :                 85%  O2 Saturation Range (%):                85-100%   O2 Saturation (minutes) <89%:        0.3  min  Pulse Rate Statistics:   Pulse Mean (bpm):  53  Pulse Range: from 46-70 bpm.    IMPRESSION: This HST indicated the presence of SEVERE OSA (obstructive sleep apnea) with an AHI of 41.2/h. REM accentuated the apnea further while sleep position did not. Notable was a reduced REM sleep proportion and a trend to bradycardia without documented hypoxia.  Snoring was not present, according to the RDI. Sleep was not fragmented, the sleep position did rarely change.   RECOMMENDATION: This degree of Sleep Apnea far too severe to not be treated. I strongly favor auto titration CPAP for this patient and would start with 6-18 cm water, 3 cm EPR , heated humidification and a mask of her choice. Please note that the patient did not snore and may be best served with a  nasal mask or nasal pillow/ cradle.       INTERPRETING PHYSICIAN:  Larey Seat, MD    [facility] Sleep Board certified by The [employer] of Sleep Medicine and Diplomate of the [employer] of Sleep Medicine. Board certified In Neurology through the [employer], Fellow of the [employer] of Neurology. Medical Director of [employer].  Sleep Summary   Start Study Time: End Study Time: Total Recording Time:      10:58:43 PM 5:49:37 AM 6 h, 50 min  Total Sleep Time % REM of Sleep Time:  6 h, 3 min  12.1     Respiratory Indices     Total Events REM NREM All Night  pRDI: pAHI 3%: ODI 4%: pAHIc 3%: % CSR: pAHI 4%:  241  241  104  6 0.0 105 53.2 53.2 43.6 5.5 39.4 39.4 14.1 0.4 41.2 41.2 17.8 1.1 0.0 17.9     Oxygen Saturation Statistics   Mean: 95 Minimum: 85 Maximum: 100  Mean of Desaturations Nadirs (%):   93  Oxygen Desatur. %:  4-9 10-20 >20 Total  Events Number Total   100  4 96.2 3.8  0 0.0  104 100.0  Oxygen Saturation: <90 <=88 <85 <80 <70  Duration (minutes): Sleep % 0.4 0.1 0.3 0.0 0.1 0.0 0.0 0.0 0.0 0.0     Pulse Rate Statistics during  Sleep (BPM)     Mean: 53 Minimum: 46 Maximum: 70       Body Position Statistics  Position Supine Prone Right Left Non-Supine  Sleep (min) 328.0 0.0 0.0 33.0 33.0  Sleep % 90.4 0.0 0.0 9.1 9.1  pRDI 40.4 N/A N/A 49.1 49.1  pAHI 3% 40.4 N/A N/A 49.1 49.1  ODI 4% 15.6 N/A N/A 40.0 40.0     Snoring Statistics Snoring Level (dB) >40 >50 >60 >70 >80 >Threshold (45)  Sleep (min) 59.2 3.2 1.2 0.0 0.0 4.3  Sleep % 16.3 0.9 0.3 0.0 0.0 1.2   Mean: 40 dB

## 2021-02-25 ENCOUNTER — Encounter: Payer: Self-pay | Admitting: Neurology

## 2021-02-25 DIAGNOSIS — G4733 Obstructive sleep apnea (adult) (pediatric): Secondary | ICD-10-CM

## 2021-02-25 HISTORY — DX: Obstructive sleep apnea (adult) (pediatric): G47.33

## 2021-02-25 NOTE — Addendum Note (Signed)
Addended by: Larey Seat on: 02/25/2021 05:07 PM   Modules accepted: Orders

## 2021-02-25 NOTE — Procedures (Signed)
HOME SLEEP TEST (Watch PAT) REPORT  STUDY DATE: 02-21-21  DOB: 13-Oct-1955  MRN: [id number]  ORDERING CLINICIAN: Larey Seat, MD   REFERRING CLINICIAN: Roque Cash, MD   CLINICAL INFORMATION/HISTORY: Brenda [name] , a 65 year-old right-handed Native -American and African-American female, presented on 01-31-2021 upon a consultation request by Dr. Forde Dandy for a possible sleep disorder.  She reports excessive daytime sleepiness, memory decline, and Insomnia. She has a medical history of Alcoholism ([place]), Respiratory Allergy, Anemia with cancer treatment / Breast cancer, left 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 Tharon Aquas Tright @[facility] ), and CHF with Stroke [facility]) (2002).  Epworth sleepiness score: 12/24. FSS at 38/63 points, both elevated  BMI: 25.0 kg/m  Neck Cirfumerence: 13"  Sleep Summary:   Total Recording Time (hours, min): 6 h 50 min Total Sleep Time (hours, min):  6 h 3 min  Percent REM (%):    12.12%   Respiratory Indices (per hour):   Calculated pAHI (per hour):              41.2        REM pAHI:                                        53.2     NREM pAHI:                                   39.4 Supine AHI:                                      40.4   Oxygen Saturation Statistics (%):    Oxygen Saturation ,Mean:           95%  Nadir oxygen saturation :                 85%  O2 Saturation Range (%):                85-100%   O2 Saturation (minutes) <89%:        0.3 min  Pulse Rate  Statistics:   Pulse Mean (bpm):  53  Pulse Range: from 46-70 bpm.    IMPRESSION: This HST indicated the presence of SEVERE OSA (obstructive sleep apnea) with an AHI of 41.2/h. REM accentuated the apnea further while sleep position did not. Notable was a reduced REM sleep proportion and a trend to bradycardia without documented hypoxia.  Snoring was not present, according to the RDI. Sleep was not fragmented, the sleep position did rarely change.   RECOMMENDATION: This degree of Sleep Apnea far too severe to not be treated. I strongly favor auto titration CPAP for this patient and would start with 6-18 cm water, 3 cm EPR , heated humidification and a mask of her choice. Please note that the patient did not snore and may be best served with a nasal mask or nasal pillow/ cradle.  INTERPRETING PHYSICIAN:  Larey Seat, MD    [facility] and [facility] Sleep Board certified by The [employer] of Sleep Medicine and Diplomate of the [employer] of Sleep Medicine. Board certified In Neurology through the [place], Fellow of the [employer] of Neurology. Medical Director of [employer].  Sleep Summary   Start Study Time: End Study Time: Total Recording Time:      10:58:43 PM 5:49:37 AM 6 h, 50 min  Total Sleep Time % REM of Sleep Time:  6 h, 3 min  12.1     Respiratory Indices     Total Events REM NREM All Night  pRDI: pAHI 3%: ODI 4%: pAHIc 3%: % CSR: pAHI 4%:  241  241  104  6 0.0 105 53.2 53.2 43.6 5.5 39.4 39.4 14.1 0.4 41.2 41.2 17.8 1.1 0.0 17.9     Oxygen Saturation Statistics   Mean: 95 Minimum: 85 Maximum: 100  Mean of Desaturations Nadirs (%):   93  Oxygen Desatur. %:  4-9 10-20 >20 Total  Events Number Total   100  4 96.2 3.8  0 0.0  104 100.0  Oxygen Saturation: <90 <=88 <85 <80 <70  Duration (minutes): Sleep % 0.4 0.1 0.3 0.0 0.1 0.0 0.0 0.0 0.0 0.0     Pulse Rate Statistics during Sleep (BPM)      Mean: 53 Minimum: 46 Maximum: 70       Body Position Statistics  Position Supine Prone Right Left Non-Supine  Sleep (min) 328.0 0.0 0.0 33.0 33.0  Sleep % 90.4 0.0 0.0 9.1 9.1  pRDI 40.4 N/A N/A 49.1 49.1  pAHI 3% 40.4 N/A N/A 49.1 49.1  ODI 4% 15.6 N/A N/A 40.0 40.0     Snoring Statistics Snoring Level (dB) >40 >50 >60 >70 >80 >Threshold (45)  Sleep (min) 59.2 3.2 1.2 0.0 0.0 4.3  Sleep % 16.3 0.9 0.3 0.0 0.0 1.2   Mean: 40 dB

## 2021-02-25 NOTE — Progress Notes (Signed)
IMPRESSION: This HST indicated the presence of SEVERE OSA (obstructive sleep apnea) with an AHI of 41.2/h. REM accentuated the apnea further while sleep position did not. Notable was a reduced REM sleep proportion and a trend to bradycardia without documented hypoxia. Snoring was not present, according to the RDI. Sleep was not fragmented, the sleep position did rarely change.  RECOMMENDATION: This degree of Sleep Apnea far too severe to not be treated. I strongly favor auto titration CPAP for this patient and would start with 6-18 cm water, 3 cm EPR , heated humidification and a mask of her choice. Please note that the patient did not snore and may be best served with a nasal mask or nasal pillow/ cradle.       INTERPRETING PHYSICIAN: Larey Seat, MD  [facility] Sleep

## 2021-02-28 ENCOUNTER — Encounter: Payer: Self-pay | Admitting: Neurology

## 2021-02-28 ENCOUNTER — Telehealth: Payer: Self-pay | Admitting: Neurology

## 2021-02-28 NOTE — Telephone Encounter (Signed)
I called pt. I advised pt that Dr. Brett Fairy reviewed their sleep study results and found that pt has severe sleep apnea. Dr. Brett Fairy recommends that pt starts auto CPAP. I reviewed PAP compliance expectations with the pt. Pt is agreeable to starting a CPAP. I advised pt that an order will be sent to a DME, [facility] and [facility] ([facility]) will call the pt within about one week after they file with the pt's insurance. [facility]) will show the pt how to use the machine, fit for masks, and troubleshoot the CPAP if needed. A follow up appt was made for insurance purposes with Dr. Brett Fairy on Sept 28,2022 at 1:30 pm. Pt verbalized understanding to arrive 15 minutes early and bring their CPAP. A letter with all of this information in it will be mailed to the pt as a reminder. I verified with the pt that the address we have on file is correct. Pt verbalized understanding of results. Pt had no questions at this time but was encouraged to call back if questions arise. I have sent the order to [facility]) and have received confirmation that they have received the order.

## 2021-02-28 NOTE — Telephone Encounter (Signed)
Called patient to discuss sleep study results. No answer at this time. LVM for the patient to call back.   

## 2021-02-28 NOTE — Telephone Encounter (Signed)
-----   Message from Larey Seat, MD sent at 02/25/2021  5:07 PM EDT ----- IMPRESSION: This HST indicated the presence of SEVERE OSA (obstructive sleep apnea) with an AHI of 41.2/h. REM accentuated the apnea further while sleep position did not. Notable was a reduced REM sleep proportion and a trend to bradycardia without documented hypoxia. Snoring was not present, according to the RDI. Sleep was not fragmented, the sleep position did rarely change.  RECOMMENDATION: This degree of Sleep Apnea far too severe to not be treated. I strongly favor auto titration CPAP for this patient and would start with 6-18 cm water, 3 cm EPR , heated humidification and a mask of her choice. Please note that the patient did not snore and may be best served with a nasal mask or nasal pillow/ cradle.       INTERPRETING PHYSICIAN: Larey Seat, MD  [facility] Sleep

## 2021-03-08 ENCOUNTER — Telehealth: Payer: Self-pay | Admitting: Neurology

## 2021-03-08 NOTE — Telephone Encounter (Signed)
Called the pt back, she was wanting to change companies. After talking with her she has agreed she will stick with where she is now.  The patient is struggling with not getting enough sleep, confusion and just felt like she was not receiving good communication. I advised the patient when she schedules an apt she should make sure that she has someone that can go with her to the visit to there is a second set of eyes to help teach her Pt verbalized understanding.

## 2021-03-08 NOTE — Telephone Encounter (Signed)
Pt requesting call back in regards to CPAP company. Please give pt a call.

## 2021-03-15 ENCOUNTER — Encounter: Payer: Self-pay | Admitting: Neurology

## 2021-03-15 NOTE — Telephone Encounter (Signed)
Pt called, not comfortable with current CPAP company. Can you schedule with another company. Would like a call from the nurse.

## 2021-03-15 NOTE — Telephone Encounter (Signed)
Called the patient last week about this and spoke with her for 30 min on this matter. I have forwarded a mychart to the patient asking her to tell me who she would like to use and then I will send orders tto whoever she chooses.

## 2021-03-16 NOTE — Telephone Encounter (Signed)
Called the patient back. She states that she is scheduled on Thursday to get set up with auto CPAP through the company. I encouraged to make sure she takes someone with her due to her memory concerns. She plans to take her 67 yr old daughter with her.

## 2021-03-22 ENCOUNTER — Other Ambulatory Visit: Payer: Self-pay | Admitting: Internal Medicine

## 2021-03-28 ENCOUNTER — Encounter: Payer: Self-pay | Admitting: Neurology

## 2021-03-28 ENCOUNTER — Telehealth: Payer: Self-pay | Admitting: Neurology

## 2021-03-28 NOTE — Telephone Encounter (Signed)
Pt called, would like a call from the nurse to discuss sooner appt. Having problem with vision in right eye. Ophthalmologist  said, I may have had stroke.

## 2021-03-29 ENCOUNTER — Ambulatory Visit (INDEPENDENT_AMBULATORY_CARE_PROVIDER_SITE_OTHER): Payer: Medicare Other | Admitting: Neurology

## 2021-03-29 ENCOUNTER — Encounter: Payer: Self-pay | Admitting: Neurology

## 2021-03-29 VITALS — BP 125/72 | HR 59 | Ht 63.0 in | Wt 155.5 lb

## 2021-03-29 DIAGNOSIS — H4612 Retrobulbar neuritis, left eye: Secondary | ICD-10-CM | POA: Diagnosis not present

## 2021-03-29 DIAGNOSIS — G4733 Obstructive sleep apnea (adult) (pediatric): Secondary | ICD-10-CM

## 2021-03-29 DIAGNOSIS — I2510 Atherosclerotic heart disease of native coronary artery without angina pectoris: Secondary | ICD-10-CM

## 2021-03-29 DIAGNOSIS — H4611 Retrobulbar neuritis, right eye: Secondary | ICD-10-CM

## 2021-03-29 NOTE — Patient Instructions (Addendum)
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  Much less likely a multiple sclerosis manifestation.  She is already on aspirin and she has risk factors for vascular disease.  I will increase her from a baby aspirin to a full size aspirin and suggest that we meet again within 6 months at that time I will repeat order an MRI at this time the special attention to the optic nerve.  I would also request a repeat visual field to see if her symptoms have actually decreased.

## 2021-03-29 NOTE — Progress Notes (Addendum)
SLEEP MEDICINE CLINIC    Provider:  Larey Seat, MD  Primary Care Physician:  [name], MD [place] [address]     Referring Provider: [name], [place],  [place] [id number]          Chief Complaint according to patient   Patient presents with:     New Patient (Initial Visit)     Pt states about a week, she started having problem vision problems in her right eye. Pt saw her Optometrist Dr. Alois Cliche last Friday. Here to rule out retro bulbar neuritis vs stroke. This visit became rededicated form sleep care to address her main concern. Pt states she she received her cpap Thursday(7/4) and has been using it off/on, unable to adjust and causing her anxiety. States shewould like to try a smaller nasal pillow. Is very  agitated when speaking about her experience with CPAP, has not used it more than 4 days       HISTORY OF PRESENT ILLNESS:  Brenda [name] is a 65 y.o. year old Native ( [place]) and [place] American female patient seen here upon referral on 03/29/2021 from Dr Forde Dandy, her PCP, and now here to follo up on her abnormal vision test, memory test, and on her sleep test.   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 Right eye.   2) HST 02-21-2021: This HST indicated the presence of SEVERE OSA (obstructive sleep apnea) with an AHI of 41.2/h. REM accentuated the apnea further while sleep position did not. Notable was a reduced REM sleep proportion and a trend to bradycardia without documented hypoxia. Snoring was not present, according to the RDI. Sleep was not fragmented, the sleep position did rarely change.   RECOMMENDATION: This degree of Sleep Apnea far too severe to not be treated.  I strongly favor auto titration CPAP for this patient and would start with 6-18 cm water, 3 cm EPR , heated humidification and a mask of her  choice. Please note that the patient did not snore and may be best served with a nasal mask or nasal pillow/ cradle.   [place]: 03-29-2021: She now reports she has too much anxiety and can't use CPAP , but only had the machine for a week ! She has CHF, breast cancer , is  with BMI of 27 kg/m2.  Brenda [name] has used the machine 5 days but she was only set up on 16 March 2021, this is an auto titratable is a pressure from 6-18 cmH2O without EPR.   Her 95th percentile pressure requirement was only 6 cmH2O she did have a mild air leak and her reduction in AHI is 1.7/h with no central apneas arising - numerically, this result looks very good as to the treatment of sleep apnea -Daughter and date have reported snoring.     Retrobulbar optic neuritis. - work up: MRI brain w/ wo had been ordered already with last consultation visit and read by Dr Leta Baptist, 5-26-2022_ MRI shows no uptake on contrast in the optic nerve,  there is a possible hygroma above the eye , left frontal. Her right eye is affected, not her left-  Vision is blurred- central field blurring.  Had no diagnosis of eye disease or disorder before.  Will order visual evoked potential,  The patient reports of blurring of the central vision field of her right  eye this by age related differential is most likely an ischemic optic neuritis.  Much less likely a multiple sclerosis manifestation.  She is already on aspirin and she has risk factors for vascular disease.   I will increase her from a baby aspirin to a full size aspirin and suggest that we meet again within 6 months at that time I will repeat order an MRI at this time the special attention to the optic nerve.   I would also request a repeat visual field to see if her blurred spot and other symptoms have actually decreased.     She was primarily seen for a memory evaluation.   Chief concern according to patient :  I don't sleep well, I have to go to the gym in AM and take me meds after the Gym,  at night I can't sleep as well without medication.  She watches TV after 11 Pm if she wakes up.      Brenda [name] has a past medical history of Alcoholism ([place]), Allergy, Anemia, Anxiety, Breast cancer, left [facility]) (oncologist-- Dr Jana Hakim, lumpectomy in 1999- double mastectomy in 2020), Carotid artery stenosis, CHF (congestive heart failure) ([place]), Chronic stable angina ([place]), Complication of anesthesia, Coronary artery disease (cardiologist-- dr Stanford Breed), Depression, Family history of lung cancer, History of blood transfusion, History of CVA with residual deficit, History of Helicobacter pylori infection (2001), Hyperlipidemia, Hypertension, Hyperthyroidism, Mild mitral regurgitation, Peripheral vascular disease ([place]), PONV (postoperative nausea and vomiting), S/P CABG x 5 (06-12-2000   by dr Lucianne Lei tright @[facility] ), and CHF with Stroke ([place]) (2002).  She was forced to leave her job in 2002-2003 after stroke, has had memory testing at that time.  Disability. Family medical /sleep history: Brac gene carrier- breast cancer/ no other family member with OSA, insomnia, sleep walkers.  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  and lives in a household with  alone. Family status is unmarried with one adult daughter, Madelynn Done, 43 years-old .  Tobacco use: 2002 quit .  ETOH use quit in 1998- untreated depression at the time,  Caffeine intake in form of Coffee( 2 cups in AM ) Soda( may be one ). Regular exercise in form of Yoga, cycling, body pump.      Sleep habits are as follows: The patient's dinner time is between 5-7 PM. The patient goes to bed at 9-10 PM  Often after she fell asleep already on the couch- and continues to sleep for intervals of 1-2 hours, wakes for 1-2 bathroom breaks.The preferred sleep position is supine , with the support of 2 pillows. Dreams are reportedly rare. She feels often  anxious.  6 AM is the usual rise time. The patient wakes up spontaneously at about 5.30.  She reports not feeling refreshed or restored in AM, with symptoms such as dry mouth and rarely  headaches, and residual fatigue.  Naps are taken in frequently, lasting from 15-30 minutes after she takes afternoon medications and are more refreshing than nocturnal sleep.    Review of Systems: Out of a complete 14 system review, the patient complains of only the following symptoms, and all other reviewed systems are negative.:   CJF, stroke, radiation to the breast and lumpectomy,  than a mastectomy.   Fatigue, sleepiness , snoring, fragmented sleep, Insomnia. ANXIETY, AGITATION, worried about health.   G D S - 7/ 15  points.   Loneliness. Subjective Memory loss.    How likely are you to doze in the following situations: 0 = not likely, 1 = slight chance, 2 = moderate chance, 3 = high chance   Sitting and Reading? Watching Television? Sitting inactive in a public place (theater or meeting)? As a passenger in a car for an hour without a break? Lying down in the afternoon when circumstances permit? Sitting and talking to someone? Sitting quietly after lunch without alcohol? In a car, while stopped for a few minutes in traffic?   Total = 12/ 24 points   FSS endorsed at 38/ 63 points.    >mmse Montreal Cognitive Assessment  01/31/2021  Visuospatial/ Executive (0/5) 3  Naming (0/3) 3  Attention: Read list of digits (0/2) 1  Attention: Read list of letters (0/1) 1  Attention: Serial 7 subtraction starting at 100 (0/3) 2  Language: Repeat phrase (0/2) 2  Language : Fluency (0/1) 1  Abstraction (0/2) 2  Delayed Recall (0/5) 2  Orientation (0/6) 6  Total 23   ( I would have not have given her the point for cube drawing) mixes up appointments.  Unfocussed.    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  Tobacco Use   Smoking status: Former    Years: 17.00    Types: Cigarettes    Quit date: 09/11/2001    Years since quitting: 19.5   Smokeless tobacco: Never  Vaping Use   Vaping Use: Never used  Substance and Sexual Activity   Alcohol use: Not Currently    Comment: Quit 1998   Drug use: No   Sexual activity: Not on file    Comment: Hysterectomy  Other Topics Concern   Not on file  Social History Narrative   Not on file   Social Determinants of Health   Financial Resource Strain: Low Risk    Difficulty of Paying Living Expenses: Not hard at all  Food Insecurity: Not on file  Transportation Needs: Not on file  Physical Activity: Not on file  Stress: Not on file  Social Connections: Not on file    Family History  Problem Relation Age of Onset   Lung cancer Mother 30       died at an early age   Other Other        There is no Hx of premature coronary artery disease in her family   Colon cancer Neg Hx    Rectal cancer Neg Hx    Stomach cancer Neg Hx     Past Medical History:  Diagnosis Date   Alcoholism ([place])    Quit 1998   Allergy    Anemia    Anxiety    Breast cancer, left [facility]) oncologist-- dr Jana Hakim   dx 1999,  DCIS;  s/p  left lumpectomy w/ node dissection's;  completed chemo and radiation 2000:  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, Grade 2, Stage 0 (ER/PR +);  11-07-2018  s/p  bilateral mastectomies   Carotid artery stenosis    bilateral---  04-27-2000  s/p  left carotid endarterectomy:  last doppler in epic 08-13-2018  right ICA 40-59%,  right ECA >50%,  left ICA 1-39% ,  bilateral subclavians stenotic   CHF (congestive heart failure) ([place])    Chronic stable angina ([place])    followed by cardiology   Complication of anesthesia    PONV   Coronary artery disease cardiologist-- dr Stanford Breed    06-12-2000  s/p  CABG x5  :  cardiac cath 02-21-2018 --  normal LVF, 80% LM, occluded RCA, 95% Ramus and 100% OM,  Patent seqSVG to  PLA-PDA & LIMA to LAD,  seqSVG to OM and RI occluded   Depression    Family history of lung cancer    History of blood transfusion    History of CVA with residual deficit    short term memory loss per pt   History of Helicobacter pylori infection 2001   Hyperlipidemia    Hypertension    Hyperthyroidism    dx yrs ago, per pt have taken medication for awhile   Mild mitral regurgitation    per echo 06/ 2019   Peripheral vascular disease ([place])    managed by vascular-- dr Donzetta Matters   PONV (postoperative nausea and vomiting)    S/P CABG x 5 06-12-2000   by dr Lucianne Lei tright @[facility]    Severe obstructive sleep apnea-hypopnea syndrome 02/25/2021   Stroke ([place]) 2002   mild memory loss    Past Surgical History:  Procedure Laterality Date   ABDOMINAL AORTOGRAM W/LOWER EXTREMITY N/A 06/13/2017   Procedure: ABDOMINAL AORTOGRAM W/LOWER EXTREMITY;  Surgeon: Waynetta Sandy, MD;  Location: [facility];  Service: Cardiovascular;  Laterality: N/A;  Bilateral   ABDOMINAL HYSTERECTOMY  1998   BREAST LUMPECTOMY WITH AXILLARY LYMPH NODE DISSECTION Left 1999   CARDIAC CATHETERIZATION  04-24-2000  dr Caryl Comes   3V CAD , subtotal PDA w/ slow flow colleterals , ef 48%   CARDIAC CATHETERIZATION  06/11/2000   3V CAD with progression of LAD disease   CAROTID ENDARTERECTOMY Left 04-27-2000   dr Amedeo Plenty  @[facility]    COLONOSCOPY  06/24/2019   [facility]   CORONARY ARTERY BYPASS GRAFT  06-12-2000   dr Lucianne Lei tright @[facility]    x 5 (left internal mammary artery to left anterior  descending coronary artery   LEFT HEART CATH AND CORS/GRAFTS ANGIOGRAPHY N/A 02/21/2018   Procedure: LEFT HEART CATH AND CORS/GRAFTS ANGIOGRAPHY;  Surgeon: Lorretta Harp, MD;  Location: [facility];  Service: Cardiovascular;  Laterality: N/A;   PERIPHERAL VASCULAR INTERVENTION  06/13/2017   Procedure: PERIPHERAL  VASCULAR INTERVENTION;  Surgeon: Waynetta Sandy, MD;  Location: [facility];  Service: Cardiovascular;;  Bilateral Iliac Stents   ROBOTIC ASSISTED SALPINGO OOPHERECTOMY Bilateral 03/13/2019   Procedure: XI ROBOTIC ASSISTED BILATERAL  SALPINGO OOPHORECTOMY;  Surgeon: Everitt Amber, MD;  Location: [facility];  Service: Gynecology;  Laterality: Bilateral;   TOTAL MASTECTOMY Bilateral 11/07/2018   Procedure: BILATERAL MASTECTOMIES;  Surgeon: Stark Klein, MD;  Location: [facility];  Service: General;  Laterality: Bilateral;     Current Outpatient Medications on File Prior to Visit  Medication Sig Dispense Refill   amLODipine (NORVASC) 10 MG tablet Take 1 tablet (10 mg total) by mouth daily. 90 tablet 3   aspirin 81 MG tablet Take 1 tablet (81 mg total) by mouth daily. With Food 30 tablet 2   carboxymethylcellulose (REFRESH PLUS) 0.5 % SOLN Apply to eye.     Cholecalciferol (VITAMIN D-3) 1000 units CAPS Take 2,000 Units by mouth daily.     FIBER ADULT GUMMIES PO Take by mouth.     gabapentin (  NEURONTIN) 300 MG capsule Take 1 capsule (300 mg total) by mouth 3 (three) times daily as needed (nerve pain). 90 capsule 3   metoprolol succinate (TOPROL-XL) 100 MG 24 hr tablet TAKE 1 TABLET BY MOUTH ONCE DAILY WITH OR IMMEDIATELY FOLLOWING MEALS. 90 tablet 4   nitroGLYCERIN (NITROSTAT) 0.4 MG SL tablet Place 1 tablet (0.4 mg total) under the tongue every 5 (five) minutes as needed for chest pain. 1 tablet under tongue every 5 minutes as needed for chest discomfort (max 2 Tab/day) 25 tablet 4   Potassium Chloride ER 20 MEQ TBCR TAKE 1 TABLET BY MOUTH ON MONDAY,WEDNESDAYS,FRIDAYS AND SUNDAYS 30 tablet 0   rosuvastatin (CRESTOR) 40 MG tablet Take 1 tablet (40 mg total) by mouth daily. 90 tablet 3   No current facility-administered medications on file prior to visit.    Allergies  Allergen Reactions   Adhesive [Tape] Itching   Latex Rash    Physical  exam:  Today's Vitals   03/29/21 1017  BP: 125/72  Pulse: (!) 59  Weight: 155 lb 8 oz (70.5 kg)  Height: 5\' 3"  (1.6 m)   Body mass index is 27.55 kg/m.   Wt Readings from Last 3 Encounters:  03/29/21 155 lb 8 oz (70.5 kg)  01/31/21 150 lb (68 kg)  06/25/20 150 lb 6.4 oz (68.2 kg)     Ht Readings from Last 3 Encounters:  03/29/21 5\' 3"  (1.6 m)  01/31/21 5\' 5"  (1.651 m)  06/25/20 5\' 3"  (1.6 m)      General: The patient is awake, alert and appears not in acute distress. The patient is well groomed. Head: Normocephalic, atraumatic. Neck is supple. Mallampati  3, dry mouth noted.  neck circumference:13 inches . Nasal airflow  patent.  Retrognathia is seen.  Dental status: poor.  Cardiovascular:  Regular rate and cardiac rhythm by pulse,  without distended neck veins. Respiratory: Lungs are clear to auscultation.  Skin:  Without evidence of ankle edema, or rash. Trunk: The patient's posture is erect.   Neurologic exam : The patient is awake and alert, oriented to place and time.   Memory subjective described as intact.  Attention span & concentration ability appears limited - too anxious, poor memory of recent dates, no memory of exact concerns as voiced by her optometrist, and she dd not bring any test results, either-  Speech is fluent,  without dysarthria, dysphonia or aphasia.  Mood and affect are anxious and upset today, states she cannot and will not use CPAP- it causes her to have panic attacks. Jenetta Loges Cognitive Assessment Blind 01/31/2021  Attention: Read list of digits (0/2) 1  Attention: Read list of letters (0/1) 1  Attention: Serial 7 subtraction starting at 100 (0/3) 2  Language: Repeat phrase (0/2) 2  Language : Fluency (0/1) 1  Abstraction (0/2) 2  Delayed Recall (0/5) 2  Orientation (0/6) 6  Total 23   Montreal Cognitive Assessment  03/29/2021  Visuospatial/ Executive (0/5) 3  Naming (0/3) 3  Attention: Read list of digits (0/2) 0  Attention: Read  list of letters (0/1) 1  Attention: Serial 7 subtraction starting at 100 (0/3) 2  Language: Repeat phrase (0/2) 2  Language : Fluency (0/1) 1  Abstraction (0/2) 2  Delayed Recall (0/5) 3  Orientation (0/6) 6  Total 23     Cranial nerves: no loss of smell or taste reported  Pupils are equal and briskly reactive to light. Funduscopic exam: pale retinae- no swelling.  Extraocular movements  without nystagmus. No Diplopia. She reports difficulties reading with her current glasses.  Visual fields by finger perimetry are limited. Right central vision is blurred. Missing are the optometric records- she was not taken these to our appointment.  Hearing was intact to soft voice . Facial sensation intact to fine touch.  Facial motor strength is symmetric and tongue and uvula move midline.  Neck ROM : rotation, tilt and flexion extension were normal for age and shoulder shrug was symmetrical.    Motor exam:  Symmetric bulk, tone and ROM.   Normal tone without cog wheeling, symmetric grip strength .   Sensory:  Fine touch  and vibration were normal. She reports right finger numbness and both feet, had PAD and has had vascular  stents placed in the femoral artery. History of vascular claudication.  Proprioception tested in the upper extremities was normal. Coordination: Rapid alternating movements in the fingers/hands were of normal speed.  The Finger-to-nose maneuver was intact without evidence of ataxia, dysmetria or tremor. Gait and station: Patient could rise unassisted from a seated position, walked without assistive device.  Stance is of normal width/ base and the patient turned with 3 steps.  Toe and heel walk were deferred.  Deep tendon reflexes: in the  upper and lower extremities are symmetric and intact.  Babinski response was deferred.     After spending a total time of 45 minutes face to face and additional time for physical and neurologic examination, review of laboratory studies,  there were no previous memory studies in EPIC.  MOCA was added.   personal review of imaging studies, reports and results of other testing and review of referral information / records as far as provided in visit, I have established the following assessments:  1)  Visio-spatial deficits notable on MOCA exam- this can be related to recent changes in vision.  Her reported difficulties reading would fit in here.  MRI brain - with and without read as normal.   She stated her Optometrist found abnormalities on eye examination, NO RECORDS HERE_   History of breast cancer, of CHF, CAD of PAD.  2)  Thyroid disease- possible neuropathy to be checked also. Marland Kitchen  3) surprisingly severe OSA but unable to tolerate CPAP due to anxiety-  non restorative sleep, EDS, snoring and small airway anatomy    My Plan is to proceed with:  1) the patient describes a central blurring of her right eye visual field-  I did not detect an abnormal color sensation which is most often a manifestation of optic nerve damage.  Ischemic optic neuritis however is a possible diagnosis. she also does not have any swelling of the optic nerve by recent MRI ( [id number]) .  Retina looks a little bit pale I think we will increase her aspirin from 81 mg to a full size aspirin to be taken after some food is in her stomach to prevent irritation,03/29/21 Hesitate to start steroids.  I would like for her to see her ophthalmologist or optometrist again with a full visual field exam within the next 4 months we will follow-up in 6 months.  Onset of symptoms was more than 10-14 days ago-  Ordered a VEP.    I would like to thank [name], MD and [name], [address] [place],  [place] [address] for allowing me to meet with and to take care of this pleasant patient.   In short, Brenda [name] is presenting  with memory loss and sleepiness, and OSA- cannot tolerate CPAP due to anxiety and I asked DME to see her for  desensitization- the patient has barely used CPAP thus far, 5 days ? Marland Kitchen Needs possible more anxiety treatment through behavior health than actual sleep clinic appointments.   Addendum from 07 April 2021 I just received the copies of Dr. Collier Salina Dunn's note he is an optometrist practicing in [place] on [address].  Patient reported to him that she noticed cloudiness of her vision with  onset on Sunday, 20 March 2021.(?) - there were recorded concerns about vision in our last visit).   When she looks at someone she can see the person's mouth and  lower part of the nose on the lower part of the ears but the central and upper facial structures are blurred- vision seems to be cloudy, not a black spot-.    He describes her as a glaucoma suspect presenting findings corrective glasses progressive on the  right +4.5 with 0.5x170 at +2.75- and are on the  left side +3.25 corrective glasses -0.25 x 175 at +2.75.  Contact lens prescription was also quoted he stated she had normal saccades smooth and ocular eye movements cup-to-disc ratio on the right horizontal 0.7 vertical 0.7 on the left horizontal 0.65 and vertical 0.65.    Tonometry on the right is 26 mmHg pressure on the left 28 mmHg  Goldmann visual field  test performed /dilation performed /visual fields :restriction in the superior field on the right.  Fields were full on the left.   Slit-lamp exam normal surface,  conjunctiva is bilaterally healthy- iris is round and reactive - there is an opacity in the lens cortex mild opacification.   No chamber inflammation healthy peripheral retinal structures and vasculature ,no drusen, no exudate ,no hemorrhage and no evidence of retinopathy.   Cup size for the optic nerves is large and the optic nerve appears symmetric, macula normal, retina flat, impression right eye visual disturbance.    I found no confirmation of Mrs. Bachicha report that she was told she had a optic neuritis -on the contrary.  Based  on this I would like her to just continue with the elevated aspirin dose and we will see her in 6 months again -  but in the meantime  I like to have a detailed visual field exam repeated in 2-3 month for this patient.    I plan to follow up either personally or through our NP within 6 month.     Electronically signed by: Larey Seat, MD 03/29/2021 11:00 AM  [facility] Board certified by The [employer] of Sleep Medicine and Diplomate of the [employer] of Sleep Medicine. Board certified In Neurology through the [place], Fellow of the [employer] of Neurology. Medical Director of [facility].

## 2021-03-29 NOTE — Telephone Encounter (Signed)
Apt made to be see on 03/29/21 at 10 am

## 2021-04-07 ENCOUNTER — Other Ambulatory Visit: Payer: Self-pay

## 2021-04-07 ENCOUNTER — Ambulatory Visit (INDEPENDENT_AMBULATORY_CARE_PROVIDER_SITE_OTHER): Payer: Medicare Other | Admitting: Pharmacist

## 2021-04-07 DIAGNOSIS — I10 Essential (primary) hypertension: Secondary | ICD-10-CM | POA: Diagnosis not present

## 2021-04-07 DIAGNOSIS — F4323 Adjustment disorder with mixed anxiety and depressed mood: Secondary | ICD-10-CM

## 2021-04-07 DIAGNOSIS — I2510 Atherosclerotic heart disease of native coronary artery without angina pectoris: Secondary | ICD-10-CM | POA: Diagnosis not present

## 2021-04-07 DIAGNOSIS — E7800 Pure hypercholesterolemia, unspecified: Secondary | ICD-10-CM

## 2021-04-07 NOTE — Patient Instructions (Addendum)
Visit Information  Phone number for Pharmacist: [phone]   Goals Addressed             This Visit's Progress    Track and Manage My Blood Pressure-Hypertension       Timeframe:  Long-Range Goal Priority:  High Start Date:             10/12/20                Expected End Date:       04/09/22             - check blood pressure 3 times per week - choose a place to take my blood pressure (home, clinic or office, retail store) - write blood pressure results in a log or diary  -Bring log to future doctor appointments   Why is this important?   You won't feel high blood pressure, but it can still hurt your blood vessels.  High blood pressure can cause heart or kidney problems. It can also cause a stroke.  Making lifestyle changes like losing a little weight or eating less salt will help.  Checking your blood pressure at home and at different times of the day can help to control blood pressure.  If the doctor prescribes medicine remember to take it the way the doctor ordered.  Call the office if you cannot afford the medicine or if there are questions about it.           Care Plan : CCM Pharmacy Care Plan  Updates made by Charlton Haws, RPH since 04/07/2021 12:00 AM     Problem: Hypertension, Hyperlipidemia, Coronary Artery Disease, Depression, and Anxiety   Priority: High     Long-Range Goal: Disease management   Start Date: 10/12/2020  Expected End Date: 04/07/2022  This Visit's Progress: On track  Recent Progress: On track  Priority: High  Note:   Current Barriers:  Unable to independently monitor therapeutic efficacy  Pharmacist Clinical Goal(s):  Patient will achieve adherence to monitoring guidelines and medication adherence to achieve therapeutic efficacy through collaboration with PharmD and provider.   Interventions: 1:1 collaboration with [name], MD regarding development and update of comprehensive plan of care as evidenced by provider  attestation and co-signature Inter-disciplinary care team collaboration (see longitudinal plan of care) Comprehensive medication review performed; medication list updated in electronic medical record  Hypertension / OSA (Goal: BP < 130/80) Controlled - pt reports SBP 115-120; recently diagnosed with severe OSA, has not been able to use CPAP due to discomfort, causing anxiety/depressed mood; she is interested in [place] device Current regimen:  Amlodipine 10 mg daily Metoprolol succinate 100 mg daily -Counseled on benefits of CPAP for OSA and overall health; advised to discuss CPAP issues with neurologist - alternatives masks, fittings are available to make it more comfortable; gave brief overview of Inspire device implantation and cost -Recommend to continue current medication  Hyperlipidemia / CAD (Goal: LDL < 70) Controlled - most recent LDL at goal; pt endorses compliance with statin; aspirin was recently increased to 325 by neurologist, concern for high risk vascular ischemia -Hx PAD, carotid artery disease, coronary atherosclerosis Current regimen:  Rosuvastatin 40 mg daily Nitroglycerin 0.4 mg SL prn Aspirin 325 mg daily -Discussed cholesterol goals and benefits of medication for prevention of heart attack / stroke -Recommend to continue current medication  Depression/Anxiety (Goal: manage symptoms) -Not ideally controlled - pt is tearful during visit regarding recent eye issues, memory issues, CPAP issues; she  endorses feeling overwhelmed and helpless -Current treatment: none -Medications previously tried/failed: bupropion, escitalopram, mirtazapine, venlafaxine -PHQ9: 1 (05/2019) -GAD7: not on file -Connected with PCP for mental health support - pt has appt with PCP tomorrow -Consider counseling/medication - defer to PCP  Patient Goals/Self-Care Activities Patient will:  - take medications as prescribed focus on medication adherence by routine -Keep appt with PCP  04/08/21 -follow up with neurologist regarding CPAP alternatives      Patient verbalizes understanding of instructions provided today and agrees to view in [place].  Telephone follow up appointment with pharmacy team member scheduled for: 3 months  Charlene Brooke, PharmD, [place], CPP Clinical Pharmacist [facility] at [facility] [phone]

## 2021-04-07 NOTE — Progress Notes (Signed)
Chronic Care Management Pharmacy Note  04/07/2021 Name:  Brenda [name] MRN:  [id number] DOB:  [birth year]  Summary: -Not taking potassium for several weeks (ran out) -Cannot tolerate CPAP due to making her depressed/anxious  Recommendations/Changes made from today's visit: -Recommend BMP at PCP appt tomorrow  -Advised to f/u with neurologist regarding CPAP titration/alternatives -Advised to keep PCP appt tomorrow   Subjective: Brenda [name] is an 65 y.o. year old female who is a primary patient of [name], MD.  The CCM team was consulted for assistance with disease management and care coordination needs.    Engaged with patient by telephone for follow up visit in response to provider referral for pharmacy case management and/or care coordination services.   Consent to Services:  The patient was given information about Chronic Care Management services, agreed to services, and gave verbal consent prior to initiation of services.  Please see initial visit note for detailed documentation.   Patient Care Team: [name], MD as PCP - General (Internal Medicine) Stanford Breed Denice Bors, MD as PCP - Cardiology (Cardiology) Reynold Bowen, MD as Consulting Physician (Endocrinology) Stark Klein, MD as Consulting Physician (General Surgery) Magrinat, Virgie Dad, MD as Consulting Physician (Oncology) Irene Limbo, MD as Consulting Physician (Plastic Surgery) Juanita Craver, MD as Consulting Physician (Gastroenterology) Everitt Amber, MD as Consulting Physician (Gynecologic Oncology) Charlton Haws, [facility] as Pharmacist (Pharmacist)   Patient is from [facility], her mother died of lung cancer when she was 60, spent some time in foster care. 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 She went to [employer] for college and stayed ever since. Single mother, 1 daughter, 2 granddaughters. Retired in 2002. Exercise is "my lifeline".  Patient-reported medical  history: -Hyperthyroid issues in the late 90s, declined radiation and pursued medical therapy.  -Breast cancer in 1999 w/ chemo and radiation.  -CABG in 2001 (likely related to radiation per patient).  -Pt reports a lot of her BP issues in the past stemmed from family stress.  -Pt reports she is having issues with memory, sometimes forgets to take her meds.  Recent office visits: 12/17/19 Dr Sharlet Salina OV: physical  Recent consult visits: 03/29/21 Dr Dohmeier (neurology): pt c/o anxiety precludes CPAP use. Increase aspirin to 325 mg due to vascular disease risk. Repeat MRI  01/31/21 Dr Brett Fairy (neurology): excessive sleepiness, memory deficit. Order MRI, sleep study. MRI no acute findings. Sleep study - severe OSA, rec'd CPAP.  Hospital visits: None in previous 6 months   Objective:  Lab Results  Component Value Date   CREATININE 0.85 01/31/2021   BUN 13 01/31/2021   GFR 57.22 (L) 07/03/2019   GFRNONAA >60 06/25/2020   GFRAA 68 09/04/2019   NA 141 01/31/2021   K 4.4 01/31/2021   CALCIUM 9.9 01/31/2021   CO2 20 01/31/2021   GLUCOSE 76 01/31/2021    Lab Results  Component Value Date/Time   HGBA1C 5.8 (H) 01/31/2021 03:32 PM   HGBA1C 6.4 05/28/2019 11:13 AM   GFR 57.22 (L) 07/03/2019 01:33 PM   GFR 65.73 05/28/2019 11:13 AM    Last diabetic Eye exam: No results found for: HMDIABEYEEXA  Last diabetic Foot exam: No results found for: HMDIABFOOTEX   Lab Results  Component Value Date   CHOL 140 05/20/2020   HDL 55 05/20/2020   LDLCALC 72 05/20/2020   LDLDIRECT 151.5 04/03/2013   TRIG 62 05/20/2020   CHOLHDL 2.5 05/20/2020    Hepatic Function Latest Ref Rng &  Units 01/31/2021 06/25/2020 05/20/2020  Total Protein 6.0 - 8.5 g/dL 8.1 7.8 7.5  Albumin 3.8 - 4.8 g/dL 5.1(H) 3.8 4.3  AST 0 - 40 IU/L 33 27 25  ALT 0 - 32 IU/L 29 23 23   Alk Phosphatase 44 - 121 IU/L 106 91 98  Total Bilirubin 0.0 - 1.2 mg/dL 0.9 0.9 0.7  Bilirubin, Direct 0.00 - 0.40 mg/dL - - 0.21    Lab  Results  Component Value Date/Time   TSH 1.320 01/31/2021 03:32 PM   TSH 0.62 07/03/2019 01:33 PM   FREET4 1.06 04/25/2018 03:45 PM   FREET4 0.92 05/21/2015 10:10 AM    CBC Latest Ref Rng & Units 01/31/2021 06/25/2020 08/08/2019  WBC 3.4 - 10.8 x10E3/uL 7.1 7.9 11.8(H)  Hemoglobin 11.1 - 15.9 g/dL 14.7 13.6 13.5  Hematocrit 34.0 - 46.6 % 43.2 40.1 39.5  Platelets 150 - 450 x10E3/uL 162 180 238    Lab Results  Component Value Date/Time   VD25OH 43.58 07/03/2019 01:33 PM   VD25OH 42.62 05/10/2017 02:57 PM    Clinical ASCVD: Yes  The 10-year ASCVD risk score Mikey Bussing DC Jr., et al., 2013) is: 7%   Values used to calculate the score:     Age: 27 years     Sex: Female     Is Non-Hispanic African American: Yes     Diabetic: No     Tobacco smoker: No     Systolic Blood Pressure: 326 mmHg     Is BP treated: Yes     HDL Cholesterol: 55 mg/dL     Total Cholesterol: 140 mg/dL    Depression screen [facility] 2/9 05/28/2019 06/21/2017  Decreased Interest 0 0  Down, Depressed, Hopeless 1 1  PHQ - 2 Score 1 1  Some recent data might be hidden     Social History   Tobacco Use  Smoking Status Former   Years: 17.00   Types: Cigarettes   Quit date: 09/11/2001   Years since quitting: 19.5  Smokeless Tobacco Never   BP Readings from Last 3 Encounters:  03/29/21 125/72  01/31/21 124/72  06/25/20 100/60   Pulse Readings from Last 3 Encounters:  03/29/21 (!) 59  01/31/21 (!) 58  06/25/20 (!) 58   Wt Readings from Last 3 Encounters:  03/29/21 155 lb 8 oz (70.5 kg)  01/31/21 150 lb (68 kg)  06/25/20 150 lb 6.4 oz (68.2 kg)   BMI Readings from Last 3 Encounters:  03/29/21 27.55 kg/m  01/31/21 24.96 kg/m  06/25/20 26.64 kg/m    Assessment/Interventions: Review of patient past medical history, allergies, medications, health status, including review of consultants reports, laboratory and other test data, was performed as part of comprehensive evaluation and provision of chronic care  management services.   SDOH:  (Social Determinants of Health) assessments and interventions performed: Yes  SDOH Screenings   Alcohol Screen: Not on file  Depression (PHQ2-9): Not on file  Financial Resource Strain: Low Risk    Difficulty of Paying Living Expenses: Not hard at all  Food Insecurity: Not on file  Housing: Not on file  Physical Activity: Not on file  Social Connections: Not on file  Stress: Not on file  Tobacco Use: Medium Risk   Smoking Tobacco Use: Former   Smokeless Tobacco Use: Never  Transportation Needs: Not on file    [place]  Allergies  Allergen Reactions   Adhesive [Tape] Itching   Latex Rash    Medications Reviewed Today  Reviewed by Charlton Haws, RPH (Pharmacist) on 04/07/21 at [place] List Status: <None>   Medication Order Taking? Sig Documenting Provider Last Dose Status Informant  amLODipine (NORVASC) 10 MG tablet [id number] Yes Take 1 tablet (10 mg total) by mouth daily. Lelon Perla, MD Taking Active   aspirin 325 MG tablet [id number] Yes Take 325 mg by mouth daily. [provider] Taking Active   carboxymethylcellulose (REFRESH PLUS) 0.5 % SOLN [id number] Yes Apply to eye. [provider] Taking Active   Cholecalciferol (VITAMIN D-3) 1000 units CAPS [id number] Yes Take 2,000 Units by mouth daily. [provider] Taking Active Self  FIBER ADULT GUMMIES PO [id number] Yes Take by mouth. [provider] Taking Active   gabapentin (NEURONTIN) 300 MG capsule [id number] Yes Take 1 capsule (300 mg total) by mouth 3 (three) times daily as needed (nerve pain). Gregor Hams, MD Taking Active   metoprolol succinate (TOPROL-XL) 100 MG 24 hr tablet [id number] Yes TAKE 1 TABLET BY MOUTH ONCE DAILY WITH OR IMMEDIATELY FOLLOWING MEALS. Lelon Perla, MD Taking Active   nitroGLYCERIN (NITROSTAT) 0.4 MG SL tablet [id number] Yes Place 1 tablet (0.4 mg total) under the tongue every 5 (five) minutes as needed  for chest pain. 1 tablet under tongue every 5 minutes as needed for chest discomfort (max 2 Tab/day) Lelon Perla, MD Taking Active   Potassium Chloride ER 20 MEQ TBCR [id number] No TAKE 1 TABLET BY MOUTH ON MONDAY,WEDNESDAYS,FRIDAYS AND SUNDAYS  Patient not taking: Reported on 04/07/2021   [name], MD Not Taking Active   rosuvastatin (CRESTOR) 40 MG tablet [id number] Yes Take 1 tablet (40 mg total) by mouth daily. Lelon Perla, MD Taking Active             Patient Active Problem List   Diagnosis Date Noted   Optic neuritis, retrobulbar (acute), left 03/29/2021   Severe obstructive sleep apnea-hypopnea syndrome 02/25/2021   PAD (peripheral artery disease) ([place]) 01/31/2021   Memory deficit following unspecified cerebrovascular disease 01/31/2021   Snoring 01/31/2021   Excessive daytime sleepiness 01/31/2021   Cognitive deficit with impaired visuospatial function 01/31/2021   Adjustment disorder 12/20/2019   Bruising 07/03/2019   Pancreatic insufficiency 05/30/2019   Routine general medical examination at a health care facility 05/30/2019   Flatulence/gas pain/belching 05/02/2019   Abdominal pain 18/29/9371   Monoallelic mutation of PALB2 gene 10/25/2018   Genetic testing 10/25/2018   Malignant neoplasm of central portion of left breast in female, estrogen receptor negative ([place]) 10/08/2018   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/08/2018   Family history of lung cancer    Loss of transverse plantar arch of left foot 07/19/2018   Diarrhea 04/25/2018   Hypothyroidism 03/02/2018   Lesion of pancreas 06/22/2017   Constipation 06/22/2017   Peripheral vascular disease ([place]) 06/01/2017   Left leg pain 05/10/2017   Allergic rhinitis 02/25/2016   Low back pain 09/29/2015   Carpal tunnel syndrome, right 08/20/2015   Hx of completed stroke 05/21/2015   Memory impairment 05/21/2015   Hyperthyroidism 12/29/2008   HYPERCHOLESTEROLEMIA 12/29/2008    Anxiety 12/29/2008   Essential hypertension 12/29/2008   Coronary atherosclerosis 12/29/2008   CAROTID ARTERY STENOSIS 12/29/2008    Immunization History  Administered Date(s) Administered   Fluad Quad(high Dose 65+) 05/23/2019   Influenza,inj,Quad PF,6+ Mos 08/20/2015, 06/21/2017   Influenza-Unspecified 06/11/2014, 07/13/2015, 05/29/2018   Pneumococcal Polysaccharide-23 03/26/2019   Tdap 09/11/2008, 06/04/2018, 11/20/2018    Conditions to be  addressed/monitored:  Hypertension, Hyperlipidemia, Coronary Artery Disease, Depression, and Anxiety  Care Plan : [place]  Updates made by Charlton Haws, RPH since 04/07/2021 12:00 AM     Problem: Hypertension, Hyperlipidemia, Coronary Artery Disease, Depression, and Anxiety   Priority: High     Long-Range Goal: Disease management   Start Date: 10/12/2020  Expected End Date: 04/07/2022  This Visit's Progress: On track  Recent Progress: On track  Priority: High  Note:   Current Barriers:  Unable to independently monitor therapeutic efficacy  Pharmacist Clinical Goal(s):  Patient will achieve adherence to monitoring guidelines and medication adherence to achieve therapeutic efficacy through collaboration with PharmD and provider.   Interventions: 1:1 collaboration with [name], MD regarding development and update of comprehensive plan of care as evidenced by provider attestation and co-signature Inter-disciplinary care team collaboration (see longitudinal plan of care) Comprehensive medication review performed; medication list updated in electronic medical record  Hypertension / OSA (Goal: BP < 130/80) Controlled - pt reports SBP 115-120; recently diagnosed with severe OSA, has not been able to use CPAP due to discomfort, causing anxiety/depressed mood; she is interested in [place] device Current regimen:  Amlodipine 10 mg daily Metoprolol succinate 100 mg daily -Counseled on benefits of CPAP for OSA  and overall health; advised to discuss CPAP issues with neurologist - alternatives masks, fittings are available to make it more comfortable; gave brief overview of Inspire device implantation and cost -Recommend to continue current medication  Hyperlipidemia / CAD (Goal: LDL < 70) Controlled - most recent LDL at goal; pt endorses compliance with statin; aspirin was recently increased to 325 by neurologist, concern for high risk vascular ischemia -Hx PAD, carotid artery disease, coronary atherosclerosis Current regimen:  Rosuvastatin 40 mg daily Nitroglycerin 0.4 mg SL prn Aspirin 325 mg daily -Discussed cholesterol goals and benefits of medication for prevention of heart attack / stroke -Recommend to continue current medication  Depression/Anxiety (Goal: manage symptoms) -Not ideally controlled - pt is tearful during visit regarding recent eye issues, memory issues, CPAP issues; she endorses feeling overwhelmed and helpless -Current treatment: none -Medications previously tried/failed: bupropion, escitalopram, mirtazapine, venlafaxine -PHQ9: 1 (05/2019) -GAD7: not on file -Connected with PCP for mental health support - pt has appt with PCP tomorrow -Consider counseling/medication - defer to PCP  Patient Goals/Self-Care Activities Patient will:  - take medications as prescribed focus on medication adherence by routine -Keep appt with PCP 04/08/21 -follow up with neurologist regarding CPAP alternatives      Medication Assistance: None required.  Patient affirms current coverage meets needs.  Compliance/Adherence/Medication fill history: Care Gaps: COVID vaccine Shingrix Prevnar (due 03/25/20) Mammogram (due 08/15/20) Colonoscopy (due 08/15/20)  Star-Rating Drugs: Rosuvastatin - LF 02/16/21  Patient's preferred pharmacy is:  [facility] ([facility]), [place] [address] [place] ([place]) [place] [address] Phone: [phone] Fax:  [phone]  Uses pill box? No -   Pt endorses 90% compliance  We discussed: Current pharmacy is preferred with insurance plan and patient is satisfied with pharmacy services Patient decided to: Continue current medication management strategy  Care Plan and Follow Up Patient Decision:  Patient agrees to Care Plan and Follow-up.  Plan: Telephone follow up appointment with care management team member scheduled for:  3 months  Charlene Brooke, PharmD, [place], CPP Clinical Pharmacist [facility] at [facility] [phone]

## 2021-04-08 ENCOUNTER — Ambulatory Visit (INDEPENDENT_AMBULATORY_CARE_PROVIDER_SITE_OTHER): Payer: Medicare Other | Admitting: Internal Medicine

## 2021-04-08 ENCOUNTER — Encounter: Payer: Self-pay | Admitting: Internal Medicine

## 2021-04-08 VITALS — BP 124/62 | HR 61 | Temp 98.4°F | Resp 18 | Ht 63.0 in | Wt 156.6 lb

## 2021-04-08 DIAGNOSIS — I10 Essential (primary) hypertension: Secondary | ICD-10-CM | POA: Diagnosis not present

## 2021-04-08 DIAGNOSIS — Z23 Encounter for immunization: Secondary | ICD-10-CM | POA: Diagnosis not present

## 2021-04-08 DIAGNOSIS — I739 Peripheral vascular disease, unspecified: Secondary | ICD-10-CM | POA: Diagnosis not present

## 2021-04-08 DIAGNOSIS — H547 Unspecified visual loss: Secondary | ICD-10-CM | POA: Insufficient documentation

## 2021-04-08 DIAGNOSIS — Z0001 Encounter for general adult medical examination with abnormal findings: Secondary | ICD-10-CM

## 2021-04-08 MED ORDER — POTASSIUM CHLORIDE ER 20 MEQ PO TBCR: EXTENDED_RELEASE_TABLET | ORAL | 3 refills | Status: DC

## 2021-04-08 MED ORDER — NITROGLYCERIN 0.4 MG SL SUBL: 0.4000 mg | SUBLINGUAL_TABLET | SUBLINGUAL | 4 refills | Status: DC | PRN

## 2021-04-08 NOTE — Patient Instructions (Addendum)
We will not check the labs today.

## 2021-04-08 NOTE — Assessment & Plan Note (Signed)
Ordered MRI brain for 3 week onset vision loss.

## 2021-04-08 NOTE — Assessment & Plan Note (Signed)
Overall stable, taking aspirin 325 mg daily and crestor 40 mg daily. Continue.

## 2021-04-08 NOTE — Progress Notes (Signed)
   Subjective:   Patient ID: Brenda [name], female    DOB: 06-Feb-1956, 65 y.o.   MRN: [id number]  HPI The patient is a 65 YO female coming in for physical.  Also new problem of vision change right eye, has seen eye doctor and they were unable to find any problem, recent MRI brain May before this which was normal.   PMH, [employer], social history reviewed and updated  Review of Systems  Constitutional: Negative.   HENT: Negative.    Eyes:  Positive for visual disturbance.  Respiratory:  Negative for cough, chest tightness and shortness of breath.   Cardiovascular:  Negative for chest pain, palpitations and leg swelling.  Gastrointestinal:  Negative for abdominal distention, abdominal pain, constipation, diarrhea, nausea and vomiting.  Musculoskeletal: Negative.   Skin: Negative.   Neurological: Negative.   Psychiatric/Behavioral: Negative.     Objective:  Physical Exam Constitutional:      Appearance: She is well-developed.  HENT:     Head: Normocephalic and atraumatic.  Cardiovascular:     Rate and Rhythm: Normal rate and regular rhythm.  Pulmonary:     Effort: Pulmonary effort is normal. No respiratory distress.     Breath sounds: Normal breath sounds. No wheezing or rales.  Abdominal:     General: Bowel sounds are normal. There is no distension.     Palpations: Abdomen is soft.     Tenderness: There is no abdominal tenderness. There is no rebound.  Musculoskeletal:     Cervical back: Normal range of motion.  Skin:    General: Skin is warm and dry.  Neurological:     Mental Status: She is alert and oriented to person, place, and time.     Coordination: Coordination normal.    Vitals:   04/08/21 1340  BP: 124/62  Pulse: 61  Resp: 18  Temp: 98.4 F (36.9 C)  TempSrc: Oral  SpO2: 99%  Weight: 156 lb 9.6 oz (71 kg)  Height: '5\' 3"'$  (1.6 m)    This visit occurred during the SARS-CoV-2 public health emergency.  Safety protocols were in place, including screening  questions prior to the visit, additional usage of staff PPE, and extensive cleaning of exam room while observing appropriate contact time as indicated for disinfecting solutions.   Assessment & Plan:  Prevnar 20 given at visit

## 2021-04-08 NOTE — Assessment & Plan Note (Signed)
Taking metoprolol and amlodipine and BP at goal. Recent labs at goal so will continue.

## 2021-04-08 NOTE — Assessment & Plan Note (Signed)
Flu shot yearly. Covid-19 counseled about booster. Pneumonia given 20 to complete. Shingrix counseled to get at pharmacy. Tetanus due 2030. Colonoscopy will ask GI when due last done 2020. Mammogram not indicated, pap smear aged out and dexa complete. Counseled about sun safety and mole surveillance. Counseled about the dangers of distracted driving. Given 10 year screening recommendations.

## 2021-04-14 ENCOUNTER — Encounter: Payer: Self-pay | Admitting: Neurology

## 2021-04-16 ENCOUNTER — Other Ambulatory Visit: Payer: Self-pay

## 2021-04-16 ENCOUNTER — Ambulatory Visit
Admission: RE | Admit: 2021-04-16 | Discharge: 2021-04-16 | Disposition: A | Discharge status: Home / Self Care | Payer: Medicare Other | Source: Ambulatory Visit | Attending: Internal Medicine | Admitting: Internal Medicine

## 2021-04-16 DIAGNOSIS — H547 Unspecified visual loss: Secondary | ICD-10-CM

## 2021-04-18 ENCOUNTER — Encounter: Payer: Self-pay | Admitting: Internal Medicine

## 2021-04-27 ENCOUNTER — Telehealth: Payer: Self-pay | Admitting: Neurology

## 2021-04-27 NOTE — Telephone Encounter (Signed)
Received a notification from [facility] that the patient has turned in her CPAP.  "patient Brenda [name] d.o.b  12/21/1955 and BT# [id number] has returned her [place] equipment on 08/08 .  Patient states she just can't adjust to the mask and she doesn't like how it makes her feel claustrophobic.  Patient states she went to see her doctor and they will figure out a different plan for therapy."

## 2021-04-29 NOTE — Progress Notes (Signed)
HPI: FU coronary artery disease and cerebrovascular disease. Patient is status post coronary artery bypass graft in 2001 (LIMA to the LAD, sequential saphenous vein graft to the distal right coronary and PDA, sequential saphenous vein graft to the ramus and obtuse marginal). She also had carotid endarterectomy at that time as well. Renal Dopplers in January of 2012 showed normal renal arteries and no abdominal aortic aneurysm. Cardiac catheterization June 2019 showed normal LV function, 80% left main, occluded right coronary artery, 95% ramus and 100% obtuse marginal.  Sequential saphenous vein graft to the PLA-PDA and LIMA to the LAD patent. Sequential saphenous vein graft to the obtuse marginal and ramus intermedius occluded.  Medical therapy recommended. Peripheral vascular disease followed by vascular surgery. Echocardiogram July 2020 showed normal LV function, mild left ventricular hypertrophy, grade 1 diastolic dysfunction. ABIs November 2020 normal. Carotid Dopplers December 2021 showed 1 to 39% right and patent endarterectomy site on the left; left subclavian stenosis noted.  Since last seen there is no dyspnea, chest pain, palpitations or syncope.  Current Outpatient Medications  Medication Sig Dispense Refill   amLODipine (NORVASC) 10 MG tablet Take 1 tablet (10 mg total) by mouth daily. 90 tablet 3   aspirin 325 MG tablet Take 325 mg by mouth daily.     carboxymethylcellulose (REFRESH PLUS) 0.5 % SOLN Apply to eye.     FIBER ADULT GUMMIES PO Take by mouth.     gabapentin (NEURONTIN) 300 MG capsule Take 1 capsule (300 mg total) by mouth 3 (three) times daily as needed (nerve pain). 90 capsule 3   metoprolol succinate (TOPROL-XL) 100 MG 24 hr tablet TAKE 1 TABLET BY MOUTH ONCE DAILY WITH OR IMMEDIATELY FOLLOWING MEALS. 90 tablet 4   nitroGLYCERIN (NITROSTAT) 0.4 MG SL tablet Place 1 tablet (0.4 mg total) under the tongue every 5 (five) minutes as needed for chest pain. 1 tablet under  tongue every 5 minutes as needed for chest discomfort (max 2 Tab/day) 25 tablet 4   Potassium Chloride ER 20 MEQ TBCR TAKE 1 TABLET BY MOUTH ON MONDAY,WEDNESDAYS,FRIDAYS AND SUNDAYS 45 tablet 3   rosuvastatin (CRESTOR) 40 MG tablet Take 1 tablet (40 mg total) by mouth daily. 90 tablet 3   Vitamin D, Ergocalciferol, 50000 units CAPS Take 1 capsule by mouth 4 (four) times a week.     No current facility-administered medications for this visit.     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, Grade 2, Stage 0 (ER/PR +);  11-07-2018  s/p  bilateral mastectomies   Carotid artery stenosis    bilateral---  04-27-2000  s/p  left carotid endarterectomy:  last doppler in epic 08-13-2018  right ICA 40-59%,  right ECA >50%,  left ICA 1-39% ,  bilateral subclavians stenotic   CHF (congestive heart failure) ([place])    Chronic stable angina ([place])    followed by cardiology   Complication of anesthesia    PONV   Coronary artery disease cardiologist-- dr Stanford Breed   06-12-2000  s/p  CABG x5  :  cardiac cath 02-21-2018 --  normal LVF, 80% LM, occluded RCA, 95% Ramus and 100% OM,  Patent seqSVG to  PLA-PDA & LIMA to LAD,  seqSVG to OM and RI occluded   Depression    Family history of lung cancer    History of blood transfusion    History of CVA with residual deficit    short term memory loss per pt   History of Helicobacter pylori infection 2001   Hyperlipidemia    Hypertension    Hyperthyroidism    dx yrs ago, per pt have taken medication for awhile   Mild mitral regurgitation    per echo 06/ 2019   Peripheral vascular disease ([place])    managed by vascular-- dr Donzetta Matters   PONV (postoperative nausea and vomiting)    S/P CABG x 5 06-12-2000   by dr Lucianne Lei  tright '@[facility]    Severe obstructive sleep apnea-hypopnea syndrome 02/25/2021   Stroke ([place]) 2002   mild memory loss    Past Surgical History:  Procedure Laterality Date   ABDOMINAL AORTOGRAM W/LOWER EXTREMITY N/A 06/13/2017   Procedure: ABDOMINAL AORTOGRAM W/LOWER EXTREMITY;  Surgeon: Waynetta Sandy, MD;  Location: [place] [facility];  Service: Cardiovascular;  Laterality: N/A;  Bilateral   ABDOMINAL HYSTERECTOMY  1998   BREAST LUMPECTOMY WITH AXILLARY LYMPH NODE DISSECTION Left 1999   CARDIAC CATHETERIZATION  04-24-2000  dr Caryl Comes   3V CAD , subtotal PDA w/ slow flow colleterals , ef 48%   CARDIAC CATHETERIZATION  06/11/2000   3V CAD with progression of LAD disease   CAROTID ENDARTERECTOMY Left 04-27-2000   dr Amedeo Plenty  '@[facility]    COLONOSCOPY  06/24/2019   [facility]   CORONARY ARTERY BYPASS GRAFT  06-12-2000   dr Lucianne Lei tright '@[facility]    x 5 (left internal mammary artery to left anterior  descending coronary artery   LEFT HEART CATH AND CORS/GRAFTS ANGIOGRAPHY N/A 02/21/2018   Procedure: LEFT HEART CATH AND CORS/GRAFTS ANGIOGRAPHY;  Surgeon: Lorretta Harp, MD;  Location: [place] [facility];  Service: Cardiovascular;  Laterality: N/A;   PERIPHERAL VASCULAR INTERVENTION  06/13/2017   Procedure: PERIPHERAL VASCULAR INTERVENTION;  Surgeon: Waynetta Sandy, MD;  Location: [place] [facility];  Service: Cardiovascular;;  Bilateral Iliac Stents   ROBOTIC ASSISTED SALPINGO OOPHERECTOMY Bilateral 03/13/2019   Procedure: XI ROBOTIC ASSISTED BILATERAL  SALPINGO OOPHORECTOMY;  Surgeon: Everitt Amber, MD;  Location: [place];  Service: Gynecology;  Laterality: Bilateral;   TOTAL MASTECTOMY Bilateral 11/07/2018   Procedure: BILATERAL MASTECTOMIES;  Surgeon: Stark Klein, MD;  Location: [place];  Service: General;  Laterality: Bilateral;    Social History   Socioeconomic History   Marital status: Single    Spouse name: Not on file   Number of children: 1   Years of  education: Not on file   Highest education level: Not on file  Occupational History   Occupation: Education officer, museum, 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  Tobacco Use   Smoking status: Former    Years: 17.00    Types: Cigarettes    Quit date: 09/11/2001    Years since quitting: 19.6   Smokeless tobacco: Never  Vaping Use   Vaping Use: Never used  Substance and Sexual Activity   Alcohol use: Not Currently    Comment: Quit 1998   Drug use: No   Sexual activity: Not on file    Comment: Hysterectomy  Other Topics Concern   Not on file  Social History Narrative   Not on file   Social Determinants of Health   Financial Resource Strain: Not on file  Food Insecurity: Not on file  Transportation Needs: Not on file  Physical Activity: Not on file  Stress: Not on file  Social Connections: Not on file  Intimate Partner Violence: Not on file    Family History  Problem Relation Age of Onset   Lung cancer Mother 87       died at an early age   Other Other        There is no Hx of premature coronary artery disease in her family   Colon cancer Neg Hx    Rectal cancer Neg Hx    Stomach cancer Neg Hx     ROS: Recent loss of vision right eye but no fevers or chills, productive cough, hemoptysis, dysphasia, odynophagia, melena, hematochezia, dysuria, hematuria, rash, seizure activity, orthopnea, PND, pedal edema, claudication. Remaining systems are negative.  Physical Exam: Well-developed well-nourished in no acute distress.  Skin is warm and dry.  HEENT is normal.  Neck is supple.  Chest is clear to auscultation with normal expansion.  Cardiovascular exam is regular rate and rhythm.  3/6 systolic murmur left sternal border. Abdominal exam nontender or distended. No masses palpated. Extremities show no edema. neuro grossly intact  ECG-sinus bradycardia at a rate of 58, normal axis, left atrial enlargement, nonspecific ST changes.  Personally reviewed  A/P  1 coronary artery  disease-patient denies chest pain.  Continue aspirin and statin.  2 carotid artery disease-plan follow-up carotid Dopplers December 2022.  3 hypertension-blood pressure controlled.  Continue present medical regimen.  4 hyperlipidemia-continue statin.  Check lipids and liver.  5 murmur-patient has a louder systolic murmur on examination of the left sternal border.  Does not sound like aortic stenosis.  We will arrange an echocardiogram to further assess.  Kirk Ruths, MD

## 2021-05-09 ENCOUNTER — Encounter: Payer: Self-pay | Admitting: Cardiology

## 2021-05-09 ENCOUNTER — Ambulatory Visit (INDEPENDENT_AMBULATORY_CARE_PROVIDER_SITE_OTHER): Payer: Medicare Other | Admitting: Cardiology

## 2021-05-09 ENCOUNTER — Other Ambulatory Visit: Payer: Self-pay

## 2021-05-09 VITALS — BP 94/60 | HR 58 | Ht 63.0 in | Wt 154.0 lb

## 2021-05-09 DIAGNOSIS — E7800 Pure hypercholesterolemia, unspecified: Secondary | ICD-10-CM | POA: Diagnosis not present

## 2021-05-09 DIAGNOSIS — I6523 Occlusion and stenosis of bilateral carotid arteries: Secondary | ICD-10-CM | POA: Diagnosis not present

## 2021-05-09 DIAGNOSIS — I25118 Atherosclerotic heart disease of native coronary artery with other forms of angina pectoris: Secondary | ICD-10-CM | POA: Diagnosis not present

## 2021-05-09 DIAGNOSIS — I10 Essential (primary) hypertension: Secondary | ICD-10-CM

## 2021-05-09 DIAGNOSIS — R011 Cardiac murmur, unspecified: Secondary | ICD-10-CM

## 2021-05-09 NOTE — Patient Instructions (Signed)
  Lab Work:  Your physician recommends that you return for lab work FASTING  If you have labs (blood work) drawn today and your tests are completely normal, you will receive your results only by: [place] (if you have [place]) OR A paper copy in the mail If you have any lab test that is abnormal or we need to change your treatment, we will call you to review the results.   Testing/Procedures:  Your physician has requested that you have an echocardiogram. Echocardiography is a painless test that uses sound waves to create images of your heart. It provides your doctor with information about the size and shape of your heart and how well your heart's chambers and valves are working. This procedure takes approximately one hour. There are no restrictions for this procedure. [facility]   Follow-Up: At [facility], you and your health needs are our priority.  As part of our continuing mission to provide you with exceptional heart care, we have created designated Provider Care Teams.  These Care Teams include your primary Cardiologist (physician) and Advanced Practice Providers (APPs -  Physician Assistants and Nurse Practitioners) who all work together to provide you with the care you need, when you need it.  We recommend signing up for the patient portal called "MyChart".  Sign up information is provided on this After Visit Summary.  MyChart is used to connect with patients for Virtual Visits (Telemedicine).  Patients are able to view lab/test results, encounter notes, upcoming appointments, etc.  Non-urgent messages can be sent to your provider as well.   To learn more about what you can do with MyChart, go to [email or web address].    Your next appointment:   12 month(s)  The format for your next appointment:   In Person  Provider:   Kirk Ruths, MD

## 2021-05-20 ENCOUNTER — Ambulatory Visit (INDEPENDENT_AMBULATORY_CARE_PROVIDER_SITE_OTHER): Payer: Medicare Other | Admitting: *Deleted

## 2021-05-20 DIAGNOSIS — Z0000 Encounter for general adult medical examination without abnormal findings: Secondary | ICD-10-CM | POA: Diagnosis not present

## 2021-05-20 NOTE — Patient Instructions (Signed)
Health Maintenance, Female Adopting a healthy lifestyle and getting preventive care are important in promoting health and wellness. Ask your health care provider about: The right schedule for you to have regular tests and exams. Things you can do on your own to prevent diseases and keep yourself healthy. What should I know about diet, weight, and exercise? Eat a healthy diet  Eat a diet that includes plenty of vegetables, fruits, low-fat dairy products, and lean protein. Do not eat a lot of foods that are high in solid fats, added sugars, or sodium. Maintain a healthy weight Body mass index (BMI) is used to identify weight problems. It estimates body fat based on height and weight. Your health care provider can help determine your BMI and help you achieve or maintain a healthy weight. Get regular exercise Get regular exercise. This is one of the most important things you can do for your health. Most adults should: Exercise for at least 150 minutes each week. The exercise should increase your heart rate and make you sweat (moderate-intensity exercise). Do strengthening exercises at least twice a week. This is in addition to the moderate-intensity exercise. Spend less time sitting. Even light physical activity can be beneficial. Watch cholesterol and blood lipids Have your blood tested for lipids and cholesterol at 67 years of age, then have this test every 5 years. Have your cholesterol levels checked more often if: Your lipid or cholesterol levels are high. You are older than 67 years of age. You are at high risk for heart disease. What should I know about cancer screening? Depending on your health history and family history, you may need to have cancer screening at various ages. This may include screening for: Breast cancer. Cervical cancer. Colorectal cancer. Skin cancer. Lung cancer. What should I know about heart disease, diabetes, and high blood pressure? Blood pressure and heart  disease High blood pressure causes heart disease and increases the risk of stroke. This is more likely to develop in people who have high blood pressure readings, are of African descent, or are overweight. Have your blood pressure checked: Every 3-5 years if you are 18-39 years of age. Every year if you are 40 years old or older. Diabetes Have regular diabetes screenings. This checks your fasting blood sugar level. Have the screening done: Once every three years after age 40 if you are at a normal weight and have a low risk for diabetes. More often and at a younger age if you are overweight or have a high risk for diabetes. What should I know about preventing infection? Hepatitis B If you have a higher risk for hepatitis B, you should be screened for this virus. Talk with your health care provider to find out if you are at risk for hepatitis B infection. Hepatitis C Testing is recommended for: Everyone born from 1945 through 1965. Anyone with known risk factors for hepatitis C. Sexually transmitted infections (STIs) Get screened for STIs, including gonorrhea and chlamydia, if: You are sexually active and are younger than 67 years of age. You are older than 67 years of age and your health care provider tells you that you are at risk for this type of infection. Your sexual activity has changed since you were last screened, and you are at increased risk for chlamydia or gonorrhea. Ask your health care provider if you are at risk. Ask your health care provider about whether you are at high risk for HIV. Your health care provider may recommend a prescription medicine   to help prevent HIV infection. If you choose to take medicine to prevent HIV, you should first get tested for HIV. You should then be tested every 3 months for as long as you are taking the medicine. Pregnancy If you are about to stop having your period (premenopausal) and you may become pregnant, seek counseling before you get  pregnant. Take 400 to 800 micrograms (mcg) of folic acid every day if you become pregnant. Ask for birth control (contraception) if you want to prevent pregnancy. Osteoporosis and menopause Osteoporosis is a disease in which the bones lose minerals and strength with aging. This can result in bone fractures. If you are 65 years old or older, or if you are at risk for osteoporosis and fractures, ask your health care provider if you should: Be screened for bone loss. Take a calcium or vitamin D supplement to lower your risk of fractures. Be given hormone replacement therapy (HRT) to treat symptoms of menopause. Follow these instructions at home: Lifestyle Do not use any products that contain nicotine or tobacco, such as cigarettes, e-cigarettes, and chewing tobacco. If you need help quitting, ask your health care provider. Do not use street drugs. Do not share needles. Ask your health care provider for help if you need support or information about quitting drugs. Alcohol use Do not drink alcohol if: Your health care provider tells you not to drink. You are pregnant, may be pregnant, or are planning to become pregnant. If you drink alcohol: Limit how much you use to 0-1 drink a day. Limit intake if you are breastfeeding. Be aware of how much alcohol is in your drink. In the [place] one drink equals one 12 oz bottle of beer (355 mL), one 5 oz glass of wine (148 mL), or one 1 oz glass of hard liquor (44 mL). General instructions Schedule regular health, dental, and eye exams. Stay current with your vaccines. Tell your health care provider if: You often feel depressed. You have ever been abused or do not feel safe at home. Summary Adopting a healthy lifestyle and getting preventive care are important in promoting health and wellness. Follow your health care provider's instructions about healthy diet, exercising, and getting tested or screened for diseases. Follow your health care provider's  instructions on monitoring your cholesterol and blood pressure. This information is not intended to replace advice given to you by your health care provider. Make sure you discuss any questions you have with your health care provider. Document Revised: 11/05/2020 Document Reviewed: 08/21/2018 Elsevier Patient Education  2022 [employer]  

## 2021-05-20 NOTE — Progress Notes (Signed)
Subjective:   Brenda [name] is a 65 y.o. female who presents for Medicare Annual (Subsequent) preventive examination.  I connected with  Brenda [name] on 05/20/21 by audio enabled telemedicine application and verified that I am speaking with the correct person using two identifiers.   I discussed the limitations of evaluation and management by telemedicine. The patient expressed understanding and agreed to proceed.   Location of patient: Home Location of Provider: Office Persons participating in visit: Brenda [name] (patient) & Brenda [name], CMA  Review of Systems    Defer to PCP Cardiac Risk Factors include: none     Objective:    There were no vitals filed for this visit. There is no height or weight on file to calculate BMI.  Advanced Directives 05/20/2021 08/08/2019 03/13/2019 02/21/2019 02/05/2019 11/07/2018 11/01/2018  Does Patient Have a Medical Advance Directive? Yes No Yes Yes Yes Yes No  Type of Paramedic of [place];Living will;Out of facility DNR (pink MOST or yellow form) - Living will [place];Living will [place];Living will [place] -  Does patient want to make changes to medical advance directive? No - Patient declined - No - Patient declined No - Patient declined - - -  Copy of [place] in Chart? Yes - validated most recent copy scanned in chart (See row information) - - Yes - validated most recent copy scanned in chart (See row information) Yes - validated most recent copy scanned in chart (See row information) Yes - validated most recent copy scanned in chart (See row information) -  Would patient like information on creating a medical advance directive? - No - Patient declined - - - - Yes (MAU/Ambulatory/Procedural Areas - Information given)    Current Medications (verified) Outpatient Encounter Medications as of 05/20/2021  Medication Sig   amLODipine (NORVASC) 10 MG tablet  Take 1 tablet (10 mg total) by mouth daily.   aspirin 325 MG tablet Take 325 mg by mouth daily.   carboxymethylcellulose (REFRESH PLUS) 0.5 % SOLN Apply to eye.   FIBER ADULT GUMMIES PO Take by mouth.   gabapentin (NEURONTIN) 300 MG capsule Take 1 capsule (300 mg total) by mouth 3 (three) times daily as needed (nerve pain).   metoprolol succinate (TOPROL-XL) 100 MG 24 hr tablet TAKE 1 TABLET BY MOUTH ONCE DAILY WITH OR IMMEDIATELY FOLLOWING MEALS.   nitroGLYCERIN (NITROSTAT) 0.4 MG SL tablet Place 1 tablet (0.4 mg total) under the tongue every 5 (five) minutes as needed for chest pain. 1 tablet under tongue every 5 minutes as needed for chest discomfort (max 2 Tab/day)   Potassium Chloride ER 20 MEQ TBCR TAKE 1 TABLET BY MOUTH ON MONDAY,WEDNESDAYS,FRIDAYS AND SUNDAYS   rosuvastatin (CRESTOR) 40 MG tablet Take 1 tablet (40 mg total) by mouth daily.   Vitamin D, Ergocalciferol, 50000 units CAPS Take 1 capsule by mouth 4 (four) times a week.   No facility-administered encounter medications on file as of 05/20/2021.    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, Grade 2, Stage 0 (ER/PR +);  11-07-2018  s/p  bilateral mastectomies   Carotid artery stenosis    bilateral---  04-27-2000  s/p  left carotid endarterectomy:  last doppler in epic 08-13-2018  right ICA 40-59%,  right ECA >50%,  left ICA 1-39% ,  bilateral subclavians stenotic   CHF (congestive heart failure) ([place])    Chronic stable angina ([place])    followed by cardiology   Complication of anesthesia    PONV   Coronary artery disease cardiologist-- dr Stanford Breed   06-12-2000  s/p  CABG x5  :  cardiac cath 02-21-2018 --   normal LVF, 80% LM, occluded RCA, 95% Ramus and 100% OM,  Patent seqSVG to  PLA-PDA & LIMA to LAD,  seqSVG to OM and RI occluded   Depression    Family history of lung cancer    History of blood transfusion    History of CVA with residual deficit    short term memory loss per pt   History of Helicobacter pylori infection 2001   Hyperlipidemia    Hypertension    Hyperthyroidism    dx yrs ago, per pt have taken medication for awhile   Mild mitral regurgitation    per echo 06/ 2019   Peripheral vascular disease ([place])    managed by vascular-- dr Donzetta Matters   PONV (postoperative nausea and vomiting)    S/P CABG x 5 06-12-2000   by dr Lucianne Lei tright [facility]    Severe obstructive sleep apnea-hypopnea syndrome 02/25/2021   Stroke ([place]) 2002   mild memory loss   Past Surgical History:  Procedure Laterality Date   ABDOMINAL AORTOGRAM W/LOWER EXTREMITY N/A 06/13/2017   Procedure: ABDOMINAL AORTOGRAM W/LOWER EXTREMITY;  Surgeon: Waynetta Sandy, MD;  Location: [place] [facility];  Service: Cardiovascular;  Laterality: N/A;  Bilateral   ABDOMINAL HYSTERECTOMY  1998   BREAST LUMPECTOMY WITH AXILLARY LYMPH NODE DISSECTION Left 1999   CARDIAC CATHETERIZATION  04-24-2000  dr Caryl Comes   3V CAD , subtotal PDA w/ slow flow colleterals , ef 48%   CARDIAC CATHETERIZATION  06/11/2000   3V CAD with progression of LAD disease   CAROTID ENDARTERECTOMY Left 04-27-2000   dr Amedeo Plenty  '@[facility]    COLONOSCOPY  06/24/2019   [facility]   CORONARY ARTERY BYPASS GRAFT  06-12-2000   dr Lucianne Lei tright [facility]    x 5 (left internal mammary artery to left anterior  descending coronary artery   LEFT HEART CATH AND CORS/GRAFTS ANGIOGRAPHY N/A 02/21/2018   Procedure: LEFT HEART CATH AND CORS/GRAFTS ANGIOGRAPHY;  Surgeon: Lorretta Harp, MD;  Location: [place] [facility];  Service: Cardiovascular;  Laterality: N/A;   PERIPHERAL VASCULAR INTERVENTION  06/13/2017   Procedure: PERIPHERAL VASCULAR INTERVENTION;  Surgeon: Waynetta Sandy, MD;   Location: [place] [facility];  Service: Cardiovascular;;  Bilateral Iliac Stents   ROBOTIC ASSISTED SALPINGO OOPHERECTOMY Bilateral 03/13/2019   Procedure: XI ROBOTIC ASSISTED BILATERAL  SALPINGO OOPHORECTOMY;  Surgeon: Everitt Amber, MD;  Location: [place];  Service: Gynecology;  Laterality: Bilateral;   TOTAL MASTECTOMY Bilateral 11/07/2018   Procedure: BILATERAL MASTECTOMIES;  Surgeon: Stark Klein, MD;  Location: [place];  Service: General;  Laterality: Bilateral;   Family History  Problem Relation Age of Onset   Lung cancer Mother 88       died at an early age   Other Other        There is no Hx of premature coronary artery disease in her family   Colon cancer Neg Hx  Rectal cancer Neg Hx    Stomach cancer Neg Hx    Social History   Socioeconomic History   Marital status: Single    Spouse name: Not on file   Number of children: 1   Years of education: Not on file   Highest education level: Not on file  Occupational History   Occupation: Education officer, museum, 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  Tobacco Use   Smoking status: Former    Years: 17.00    Types: Cigarettes    Quit date: 09/11/2001    Years since quitting: 19.7   Smokeless tobacco: Never  Vaping Use   Vaping Use: Never used  Substance and Sexual Activity   Alcohol use: Not Currently    Comment: Quit 1998   Drug use: No   Sexual activity: Not on file    Comment: Hysterectomy  Other Topics Concern   Not on file  Social History Narrative   Not on file   Social Determinants of Health   Financial Resource Strain: Low Risk    Difficulty of Paying Living Expenses: Not hard at all  Food Insecurity: No Food Insecurity   Worried About Charity fundraiser in the Last Year: Never true   [place] in the Last Year: Never true  Transportation Needs: No Transportation Needs   Lack of Transportation (Medical): No   Lack of Transportation (Non-Medical): No  Physical Activity:  Sufficiently Active   Days of Exercise per Week: 5 days   Minutes of Exercise per Session: 30 min  Stress: No Stress Concern Present   Feeling of Stress : Not at all  Social Connections: Moderately Isolated   Frequency of Communication with Friends and Family: More than three times a week   Frequency of Social Gatherings with Friends and Family: More than three times a week   Attends Religious Services: More than 4 times per year   Active Member of [employer] or Organizations: No   Attends Archivist Meetings: Never   Marital Status: Never married    Tobacco Counseling Counseling given: Not Answered   Clinical Intake:     Pain : No/denies pain   Diabetic? No  Interpreter Needed?: No      Activities of Daily Living In your present state of health, do you have any difficulty performing the following activities: 05/20/2021 04/08/2021  Hearing? N N  Vision? Y N  Comment right eye -  Difficulty concentrating or making decisions? Y N  Walking or climbing stairs? N N  Dressing or bathing? N N  Doing errands, shopping? N N  Preparing Food and eating ? N -  Using the Toilet? N -  In the past six months, have you accidently leaked urine? N -  Do you have problems with loss of bowel control? N -  Managing your Medications? N -  Managing your Finances? N -  Housekeeping or managing your Housekeeping? N -  Some recent data might be hidden    Patient Care Team: [name], MD as PCP - General (Internal Medicine) Stanford Breed, Denice Bors, MD as PCP - Cardiology (Cardiology) Reynold Bowen, MD as Consulting Physician (Endocrinology) Stark Klein, MD as Consulting Physician (General Surgery) Magrinat, Virgie Dad, MD as Consulting Physician (Oncology) Irene Limbo, MD as Consulting Physician (Plastic Surgery) Juanita Craver, MD as Consulting Physician (Gastroenterology) Everitt Amber, MD as Consulting Physician (Gynecologic Oncology) Charlton Haws, [facility] as  Pharmacist (Pharmacist)  Indicate any recent Medical Services  you may have received from other than [facility] providers in the past year (date may be approximate).     Assessment:   This is a routine wellness examination for [place].  Hearing/Vision screen No results found.  Dietary issues and exercise activities discussed: Current Exercise Habits: Home exercise routine, Type of exercise: walking;yoga;strength training/weights, Time (Minutes): 30, Frequency (Times/Week): 5, Weekly Exercise (Minutes/Week): 150, Intensity: Moderate   Goals Addressed   None    Depression Screen PHQ 2/9 Scores 05/20/2021 04/08/2021 05/28/2019 06/21/2017  PHQ - 2 Score 0 0 1 1    Fall Risk Fall Risk  05/20/2021 01/31/2021 05/28/2019  Falls in the past year? 0 0 0  Number falls in past yr: 0 - -  Injury with Fall? 0 - -    FALL RISK PREVENTION PERTAINING TO THE HOME:  Any stairs in or around the home? Yes  If so, are there any without handrails? No  Home free of loose throw rugs in walkways, pet beds, electrical cords, etc? Yes  Adequate lighting in your home to reduce risk of falls? Yes   ASSISTIVE DEVICES UTILIZED TO PREVENT FALLS:  Life alert? No  Use of a cane, walker or w/c? No  Grab bars in the bathroom? No  Shower chair or bench in shower? No  Elevated toilet seat or a handicapped toilet? No   TIMED UP AND GO:  Was the test performed? No .    Cognitive Function:   Montreal Cognitive Assessment  01/31/2021  Visuospatial/ Executive (0/5) 3  Naming (0/3) 3  Attention: Read list of digits (0/2) 1  Attention: Read list of letters (0/1) 1  Attention: Serial 7 subtraction starting at 100 (0/3) 2  Language: Repeat phrase (0/2) 2  Language : Fluency (0/1) 1  Abstraction (0/2) 2  Delayed Recall (0/5) 2  Orientation (0/6) 6  Total 23   6CIT Screen 05/20/2021  What Year? 0 points  What month? 0 points  What time? 0 points  Count back from 20 0 points  Months in reverse 2 points  Repeat phrase  0 points  Total Score 2    Immunizations Immunization History  Administered Date(s) Administered   Fluad Quad(high Dose 65+) 05/23/2019   Influenza,inj,Quad PF,6+ Mos 08/20/2015, 06/21/2017   Influenza-Unspecified 06/11/2014, 07/13/2015, 05/29/2018   PNEUMOCOCCAL CONJUGATE-20 04/08/2021   Pneumococcal Polysaccharide-23 03/26/2019   Tdap 09/11/2008, 06/04/2018, 11/20/2018    TDAP status: Up to date  Flu Vaccine status: Due, Education has been provided regarding the importance of this vaccine. Advised may receive this vaccine at local pharmacy or Health Dept. Aware to provide a copy of the vaccination record if obtained from local pharmacy or Health Dept. Verbalized acceptance and understanding.  Pneumococcal vaccine status: Up to date  Covid-19 vaccine status: Information provided on how to obtain vaccines.   Qualifies for Shingles Vaccine? Yes   Zostavax completed No   Shingrix Completed?: No.    Education has been provided regarding the importance of this vaccine. Patient has been advised to call insurance company to determine out of pocket expense if they have not yet received this vaccine. Advised may also receive vaccine at local pharmacy or Health Dept. Verbalized acceptance and understanding.  Screening Tests Health Maintenance  Topic Date Due   COVID-19 Vaccine (1) Never done   Zoster Vaccines- Shingrix (1 of 2) Never done   INFLUENZA VACCINE  12/09/2021 (Originally 04/11/2021)   PNA vac Low Risk Adult (2 of 2 - PCV13) 04/08/2022   TETANUS/TDAP  11/19/2028   COLONOSCOPY (Pts 45-66yr Insurance coverage will need to be confirmed)  08/17/2029   DEXA SCAN  Completed   Hepatitis C Screening  Completed   HPV VACCINES  Aged Out    Health Maintenance  Health Maintenance Due  Topic Date Due   COVID-19 Vaccine (1) Never done   Zoster Vaccines- Shingrix (1 of 2) Never done    Colorectal cancer screening: Type of screening: Colonoscopy. Completed 08/18/2019. Repeat every 10  years  Mammogram status: No longer required due to double mastectomy.  Bone Density status: Completed 01/24/2011. Results reflect: Bone density results: OSTEOPOROSIS. Repeat every 5 years.  Lung Cancer Screening: (Low Dose CT Chest recommended if Age 67-80years, 30 pack-year currently smoking OR have quit w/in 15years.) does not qualify.    Additional Screening:  Hepatitis C Screening: does not qualify; Completed   Vision Screening: Recommended annual ophthalmology exams for early detection of glaucoma and other disorders of the eye. Is the patient up to date with their annual eye exam?  Yes  Who is the provider or what is the name of the office in which the patient attends annual eye exams? Dr. PAlois ClicheIf pt is not established with a provider, would they like to be referred to a provider to establish care? No .   Dental Screening: Recommended annual dental exams for proper oral hygiene  Community Resource Referral / Chronic Care Management: CRR required this visit?  No   CCM required this visit?  No      Plan:     I have personally reviewed and noted the following in the patient's chart:   Medical and social history Use of alcohol, tobacco or illicit drugs  Current medications and supplements including opioid prescriptions.  Functional ability and status Nutritional status Physical activity Advanced directives List of other physicians Hospitalizations, surgeries, and ER visits in previous 12 months Vitals Screenings to include cognitive, depression, and falls Referrals and appointments  In addition, I have reviewed and discussed with patient certain preventive protocols, quality metrics, and best practice recommendations. A written personalized care plan for preventive services as well as general preventive health recommendations were provided to patient.     [place]annon [place]  05/20/2021   Nurse Notes: 25 minutes non face to face

## 2021-05-23 ENCOUNTER — Other Ambulatory Visit (HOSPITAL_BASED_OUTPATIENT_CLINIC_OR_DEPARTMENT_OTHER): Payer: Medicare Other

## 2021-05-27 ENCOUNTER — Other Ambulatory Visit: Payer: Self-pay

## 2021-05-27 ENCOUNTER — Ambulatory Visit (HOSPITAL_COMMUNITY): Payer: Medicare Other | Attending: Cardiology

## 2021-05-27 DIAGNOSIS — R011 Cardiac murmur, unspecified: Secondary | ICD-10-CM

## 2021-05-27 LAB — ECHOCARDIOGRAM COMPLETE
Area-P 1/2: 4.39 cm2
MV M vel: 4.71 m/s
MV Peak grad: 88.7 mmHg
S' Lateral: 1.8 cm

## 2021-06-02 ENCOUNTER — Other Ambulatory Visit: Payer: Self-pay

## 2021-06-02 MED ORDER — AMLODIPINE BESYLATE 10 MG PO TABS
10.0000 mg | ORAL_TABLET | Freq: Every day | ORAL | 3 refills | Status: DC
Start: 2021-06-02 — End: 2022-10-25

## 2021-06-08 ENCOUNTER — Ambulatory Visit: Payer: Self-pay | Admitting: Neurology

## 2021-06-13 ENCOUNTER — Telehealth: Payer: Self-pay | Admitting: Oncology

## 2021-06-13 NOTE — Telephone Encounter (Signed)
Rescheduled per 10/3 sch msg, pt was called and confirmed pt

## 2021-06-13 NOTE — Telephone Encounter (Signed)
disregard

## 2021-06-14 ENCOUNTER — Encounter: Payer: Self-pay | Admitting: *Deleted

## 2021-06-23 ENCOUNTER — Other Ambulatory Visit: Payer: Medicare Other

## 2021-06-23 ENCOUNTER — Ambulatory Visit: Payer: Medicare Other | Admitting: Oncology

## 2021-06-27 ENCOUNTER — Other Ambulatory Visit: Payer: Self-pay | Admitting: Cardiology

## 2021-06-27 DIAGNOSIS — E7800 Pure hypercholesterolemia, unspecified: Secondary | ICD-10-CM

## 2021-06-27 DIAGNOSIS — R0602 Shortness of breath: Secondary | ICD-10-CM

## 2021-06-27 DIAGNOSIS — I2510 Atherosclerotic heart disease of native coronary artery without angina pectoris: Secondary | ICD-10-CM

## 2021-06-27 DIAGNOSIS — I10 Essential (primary) hypertension: Secondary | ICD-10-CM

## 2021-06-28 ENCOUNTER — Encounter: Payer: Self-pay | Admitting: *Deleted

## 2021-06-28 ENCOUNTER — Other Ambulatory Visit: Payer: Self-pay | Admitting: Cardiology

## 2021-06-28 DIAGNOSIS — E7800 Pure hypercholesterolemia, unspecified: Secondary | ICD-10-CM

## 2021-07-05 ENCOUNTER — Ambulatory Visit (INDEPENDENT_AMBULATORY_CARE_PROVIDER_SITE_OTHER): Payer: Medicare Other | Admitting: Pharmacist

## 2021-07-05 ENCOUNTER — Other Ambulatory Visit: Payer: Self-pay

## 2021-07-05 DIAGNOSIS — I2510 Atherosclerotic heart disease of native coronary artery without angina pectoris: Secondary | ICD-10-CM

## 2021-07-05 DIAGNOSIS — E7800 Pure hypercholesterolemia, unspecified: Secondary | ICD-10-CM

## 2021-07-05 DIAGNOSIS — I10 Essential (primary) hypertension: Secondary | ICD-10-CM

## 2021-07-05 NOTE — Progress Notes (Signed)
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 year old female who is a primary patient of [name], MD.  The CCM team was consulted for assistance with disease management and care coordination needs.    Engaged with patient by telephone for follow up visit in response to provider referral for pharmacy case management and/or care coordination services.   Consent to Services:  The patient was given information about Chronic Care Management services, agreed to services, and gave verbal consent prior to initiation of services.  Please see initial visit note for detailed documentation.   Patient Care Team: [name], MD as PCP - General (Internal Medicine) Stanford Breed Denice Bors, MD as PCP - Cardiology (Cardiology) Reynold Bowen, MD as Consulting Physician (Endocrinology) Stark Klein, MD as Consulting Physician (General Surgery) Magrinat, Virgie Dad, MD as Consulting Physician (Oncology) Irene Limbo, MD as Consulting Physician (Plastic Surgery) Juanita Craver, MD as Consulting Physician (Gastroenterology) Everitt Amber, MD as Consulting Physician (Gynecologic Oncology) Charlton Haws, [facility] as Pharmacist (Pharmacist)   Patient is from [facility], her mother died of lung cancer when she was 50, spent some time in foster care. 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 She went to [employer] for college and stayed ever since. Single mother, 1 daughter, 2 granddaughters. Retired in 2002. Exercise is "my  lifeline".  Patient-reported medical history: -Hyperthyroid issues in the late 90s, declined radiation and pursued medical therapy.  -Breast cancer in 1999 w/ chemo and radiation.  -CABG in 2001 (likely related to radiation per patient).  -Pt reports a lot of her BP issues in the past stemmed from family stress.  -Pt reports she is having issues with memory, sometimes forgets to take her meds.  Recent office visits: 04/01/21 Dr Sharlet Salina OV: CPE; ordered MRI brain for vision loss - no acute changes.  Recent consult visits: 05/09/21 Dr Stanford Breed (cardiology): f/u CAD; ordered lipid panel and LFTs (pt did not get labs); ordered ECHO - normal LV function, mild mitral regurg  03/29/21 Dr Brett Fairy (neurology): pt c/o anxiety precludes CPAP use. Increase aspirin to 325 mg due to vascular disease risk. Repeat MRI  01/31/21 Dr Brett Fairy (neurology): excessive sleepiness, memory deficit. Order MRI, sleep study. MRI no acute findings. Sleep study - severe OSA, rec'd CPAP.  Hospital visits: None in previous 6 months   Objective:  Lab Results  Component Value Date   CREATININE 0.85 01/31/2021   BUN 13 01/31/2021   GFR 57.22 (L) 07/03/2019   GFRNONAA >60 06/25/2020   GFRAA 68 09/04/2019   NA 141 01/31/2021   K 4.4 01/31/2021   CALCIUM 9.9 01/31/2021   CO2 20 01/31/2021   GLUCOSE 76 01/31/2021    Lab Results  Component Value Date/Time   HGBA1C 5.8 (H) 01/31/2021 03:32 PM   HGBA1C 6.4 05/28/2019 11:13 AM   GFR 57.22 (L) 07/03/2019 01:33 PM   GFR 65.73 05/28/2019 11:13 AM    Last diabetic Eye exam: No results found for: HMDIABEYEEXA  Last diabetic Foot exam: No results found for: HMDIABFOOTEX  Lab Results  Component Value Date   CHOL 140 05/20/2020   HDL 55 05/20/2020   LDLCALC 72 05/20/2020   LDLDIRECT 151.5 04/03/2013   TRIG 62 05/20/2020   CHOLHDL 2.5 05/20/2020    Hepatic Function Latest Ref Rng & Units 01/31/2021 06/25/2020 05/20/2020  Total Protein 6.0 - 8.5 g/dL 8.1 7.8 7.5   Albumin 3.8 - 4.8 g/dL 5.1(H) 3.8 4.3  AST 0 - 40 IU/L 33 27 25  ALT 0 - 32 IU/L 29 23 23   Alk Phosphatase 44 - 121 IU/L 106 91 98  Total Bilirubin 0.0 - 1.2 mg/dL 0.9 0.9 0.7  Bilirubin, Direct 0.00 - 0.40 mg/dL - - 0.21    Lab Results  Component Value Date/Time   TSH 1.320 01/31/2021 03:32 PM   TSH 0.62 07/03/2019 01:33 PM   FREET4 1.06 04/25/2018 03:45 PM   FREET4 0.92 05/21/2015 10:10 AM    CBC Latest Ref Rng & Units 01/31/2021 06/25/2020 08/08/2019  WBC 3.4 - 10.8 x10E3/uL 7.1 7.9 11.8(H)  Hemoglobin 11.1 - 15.9 g/dL 14.7 13.6 13.5  Hematocrit 34.0 - 46.6 % 43.2 40.1 39.5  Platelets 150 - 450 x10E3/uL 162 180 238    Lab Results  Component Value Date/Time   VD25OH 43.58 07/03/2019 01:33 PM   VD25OH 42.62 05/10/2017 02:57 PM    Clinical ASCVD: Yes  The 10-year ASCVD risk score (Arnett DK, et al., 2019) is: 3.8%   Values used to calculate the score:     Age: 65 years     Sex: Female     Is Non-Hispanic African American: Yes     Diabetic: No     Tobacco smoker: No     Systolic Blood Pressure: 94 mmHg     Is BP treated: Yes     HDL Cholesterol: 55 mg/dL     Total Cholesterol: 140 mg/dL    Depression screen [facility] 2/9 05/20/2021 04/08/2021 05/28/2019  Decreased Interest 0 0 0  Down, Depressed, Hopeless 0 0 1  PHQ - 2 Score 0 0 1  Some recent data might be hidden     Social History   Tobacco Use  Smoking Status Former   Years: 17.00   Types: Cigarettes   Quit date: 09/11/2001   Years since quitting: 19.8  Smokeless Tobacco Never   BP Readings from Last 3 Encounters:  05/09/21 94/60  04/08/21 124/62  03/29/21 125/72   Pulse Readings from Last 3 Encounters:  05/09/21 (!) 58  04/08/21 61  03/29/21 (!) 59   Wt Readings from Last 3 Encounters:  05/09/21 154 lb 0.6 oz (69.9 kg)  04/08/21 156 lb 9.6 oz (71 kg)  03/29/21 155 lb 8 oz (70.5 kg)   BMI Readings from Last 3 Encounters:  05/09/21 27.29 kg/m  04/08/21 27.74 kg/m  03/29/21 27.55 kg/m     Assessment/Interventions: Review of patient past medical history, allergies, medications, health status, including review of consultants reports, laboratory and other test data, was performed as part of comprehensive evaluation and provision of chronic care management services.   SDOH:  (Social Determinants of Health) assessments and interventions performed: Yes  SDOH Screenings   Alcohol Screen: Low Risk    Last Alcohol Screening Score (AUDIT): 0  Depression (PHQ2-9): Low Risk    PHQ-2 Score: 0  Financial Resource Strain: Low Risk    Difficulty of Paying Living Expenses: Not hard at all  Food Insecurity: No Food Insecurity   Worried About Charity fundraiser in the Last Year:  Never true   Ran Out of Food in the Last Year: Never true  Housing: Low Risk    Last Housing Risk Score: 0  Physical Activity: Sufficiently Active   Days of Exercise per Week: 5 days   Minutes of Exercise per Session: 30 min  Social Connections: Moderately Isolated   Frequency of Communication with Friends and Family: More than three times a week   Frequency of Social Gatherings with Friends and Family: More than three times a week   Attends Religious Services: More than 4 times per year   Active Member of [employer] or Organizations: No   Attends Music therapist: Never   Marital Status: Never married  Stress: No Stress Concern Present   Feeling of Stress : Not at all  Tobacco Use: Medium Risk   Smoking Tobacco Use: Former   Smokeless Tobacco Use: Never   Passive Exposure: Not on file  Transportation Needs: No Transportation Needs   Lack of Transportation (Medical): No   Lack of Transportation (Non-Medical): No    CCM Care Plan  Allergies  Allergen Reactions   Adhesive [Tape] Itching   Latex Rash    Medications Reviewed Today     Reviewed by Charlton Haws, [facility] (Pharmacist) on 07/05/21 at 72  Med List Status: <None>   Medication Order Taking? Sig Documenting Provider Last  Dose Status Informant  amLODipine (NORVASC) 10 MG tablet [id number] Yes Take 1 tablet (10 mg total) by mouth daily. Lelon Perla, MD Taking Active   aspirin 325 MG tablet [id number] Yes Take 325 mg by mouth daily. [provider] Taking Active   carboxymethylcellulose (REFRESH PLUS) 0.5 % SOLN [id number] Yes Apply to eye. [provider] Taking Active   FIBER ADULT [place] [id number] Yes Take by mouth. [provider] Taking Active   gabapentin (NEURONTIN) 300 MG capsule [id number] Yes Take 1 capsule (300 mg total) by mouth 3 (three) times daily as needed (nerve pain). Gregor Hams, MD Taking Active   metoprolol succinate (TOPROL-XL) 100 MG 24 hr tablet [id number] Yes TAKE 1 TABLET BY MOUTH ONCE DAILY WITH OR IMMEDIATELY FOLLOWING MEALS. Lelon Perla, MD Taking Active   nitroGLYCERIN (NITROSTAT) 0.4 MG SL tablet [id number] Yes Place 1 tablet (0.4 mg total) under the tongue every 5 (five) minutes as needed for chest pain. 1 tablet under tongue every 5 minutes as needed for chest discomfort (max 2 Tab/day) [name], MD Taking Active   Potassium Chloride ER 20 MEQ TBCR [id number] Yes TAKE 1 TABLET BY MOUTH ON MONDAY,WEDNESDAYS,FRIDAYS AND SUNDAYS [name], MD Taking Active   rosuvastatin (CRESTOR) 40 MG tablet [id number] Yes Take 1 tablet by mouth once daily Lelon Perla, MD Taking Active   Vitamin D, Ergocalciferol, 50000 units CAPS [id number] Yes Take 1 capsule by mouth 4 (four) times a week. [provider] Taking Active             Patient Active Problem List   Diagnosis Date Noted   Vision loss 04/08/2021   Optic neuritis, retrobulbar (acute), left 03/29/2021   Severe obstructive sleep apnea-hypopnea syndrome 02/25/2021   PAD (peripheral artery disease) ([place]) 01/31/2021   Memory deficit following unspecified cerebrovascular disease 01/31/2021   Snoring 01/31/2021   Excessive daytime sleepiness 01/31/2021   Cognitive  deficit with impaired visuospatial function 01/31/2021   Adjustment disorder 12/20/2019   Pancreatic insufficiency 05/30/2019   Encounter for general adult medical examination with abnormal findings 30/16/0109   Monoallelic mutation  of PALB2 gene 10/25/2018   Genetic testing 10/25/2018   Malignant neoplasm of central portion of left breast in female, estrogen receptor negative ([place]) 10/08/2018   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/08/2018   Loss of transverse plantar arch of left foot 07/19/2018   Hypothyroidism 03/02/2018   Lesion of pancreas 06/22/2017   Constipation 06/22/2017   Peripheral vascular disease ([place]) 06/01/2017   Left leg pain 05/10/2017   Allergic rhinitis 02/25/2016   Low back pain 09/29/2015   Carpal tunnel syndrome, right 08/20/2015   Hx of completed stroke 05/21/2015   Memory impairment 05/21/2015   Hyperthyroidism 12/29/2008   HYPERCHOLESTEROLEMIA 12/29/2008   Anxiety 12/29/2008   Essential hypertension 12/29/2008   Coronary atherosclerosis 12/29/2008   CAROTID ARTERY STENOSIS 12/29/2008    Immunization History  Administered Date(s) Administered   Fluad Quad(high Dose 65+) 05/23/2019   Influenza,inj,Quad PF,6+ Mos 08/20/2015, 06/21/2017   Influenza-Unspecified 06/11/2014, 07/13/2015, 05/29/2018   PFIZER Comirnaty(Gray Top)Covid-19 Tri-Sucrose Vaccine 10/16/2019, 11/06/2019, 06/28/2020, 02/22/2021   PNEUMOCOCCAL CONJUGATE-20 04/08/2021   Pneumococcal Polysaccharide-23 03/26/2019   Tdap 09/11/2008, 06/04/2018, 11/20/2018    Conditions to be addressed/monitored:  Hypertension, Hyperlipidemia, and Coronary Artery Disease  Care Plan : [place]  Updates made by Charlton Haws, [place] since 07/05/2021 12:00 AM     Problem: Hypertension, Hyperlipidemia, and Coronary Artery Disease   Priority: High     Long-Range Goal: Disease management   Start Date: 10/12/2020  Expected End Date: 04/07/2022  This Visit's Progress: On track   Recent Progress: On track  Priority: High  Note:   Current Barriers:  Unable to independently monitor therapeutic efficacy  Pharmacist Clinical Goal(s):  Patient will achieve adherence to monitoring guidelines and medication adherence to achieve therapeutic efficacy through collaboration with PharmD and provider.   Interventions: 1:1 collaboration with [name], MD regarding development and update of comprehensive plan of care as evidenced by provider attestation and co-signature Inter-disciplinary care team collaboration (see longitudinal plan of care) Comprehensive medication review performed; medication list updated in electronic medical record  Hypertension / OSA (Goal: BP < 130/80) Controlled - pt reports SBP 117 recently; she was diagnosed with severe OSA summer 2022, has not been able to use CPAP due to discomfort Current regimen:  Amlodipine 10 mg daily Metoprolol succinate 100 mg daily -Counseled on medication compliance -Recommend to continue current medication  Hyperlipidemia / CAD (Goal: LDL < 70) Controlled - most recent LDL at goal however she is overdue for repeat - labs were ordered per cardiology 8/29 but pt forgot to stay for labwork; pt endorses compliance with statin; aspirin was recently increased to 325 by neurologist, concern for high risk vascular ischemia -Hx PAD, carotid artery disease, coronary atherosclerosis Current regimen:  Rosuvastatin 40 mg daily Nitroglycerin 0.4 mg SL prn Aspirin 325 mg daily -Discussed cholesterol goals and benefits of medication for prevention of heart attack / stroke -Advised to follow up with cardiology for repeat lipid panel, LFTs  Patient Goals/Self-Care Activities Patient will:  - take medications as prescribed -focus on medication adherence by routine -Follow up with cardiology for labwork -follow up with neurologist regarding CPAP alternatives     Medication Assistance: None required.  Patient affirms  current coverage meets needs.  Compliance/Adherence/Medication fill history: Care Gaps: COVID booster Shingrix  Star-Rating Drugs: Rosuvastatin - LF 06/30/21 x 90 ds  Patient's preferred pharmacy is:  [facility] ([facility]), [place] - [address] [place] (  [place]) [place] [address] Phone: [phone] Fax: [phone]  Uses pill box? Yes Pt endorses 90% compliance  We discussed: Current pharmacy is preferred with insurance plan and patient is satisfied with pharmacy services Patient decided to: Continue current medication management strategy  Care Plan and Follow Up Patient Decision:  Patient agrees to Care Plan and Follow-up.  Plan: Telephone follow up appointment with care management team member scheduled for:  6 months  Charlene Brooke, PharmD, [place], CPP Clinical Pharmacist [facility] at [facility] [phone]

## 2021-07-05 NOTE — Patient Instructions (Signed)
Visit Information  Phone number for Pharmacist: [phone]   Goals Addressed             This Visit's Progress    Track and Manage My Blood Pressure-Hypertension       Timeframe:  Long-Range Goal Priority:  High Start Date:             10/12/20                Expected End Date:       04/09/22             Follow up date: April 2023  - check blood pressure 3 times per week - choose a place to take my blood pressure (home, clinic or office, retail store) - write blood pressure results in a log or diary  -Bring log to future doctor appointments   Why is this important?   You won't feel high blood pressure, but it can still hurt your blood vessels.  High blood pressure can cause heart or kidney problems. It can also cause a stroke.  Making lifestyle changes like losing a little weight or eating less salt will help.  Checking your blood pressure at home and at different times of the day can help to control blood pressure.  If the doctor prescribes medicine remember to take it the way the doctor ordered.  Call the office if you cannot afford the medicine or if there are questions about it.           Care Plan : [place]  Updates made by Charlton Haws, RPH since 07/05/2021 12:00 AM     Problem: Hypertension, Hyperlipidemia, and Coronary Artery Disease   Priority: High     Long-Range Goal: Disease management   Start Date: 10/12/2020  Expected End Date: 04/07/2022  This Visit's Progress: On track  Recent Progress: On track  Priority: High  Note:   Current Barriers:  Unable to independently monitor therapeutic efficacy  Pharmacist Clinical Goal(s):  Patient will achieve adherence to monitoring guidelines and medication adherence to achieve therapeutic efficacy through collaboration with PharmD and provider.   Interventions: 1:1 collaboration with [name], MD regarding development and update of comprehensive plan of care as evidenced by  provider attestation and co-signature Inter-disciplinary care team collaboration (see longitudinal plan of care) Comprehensive medication review performed; medication list updated in electronic medical record  Hypertension / OSA (Goal: BP < 130/80) Controlled - pt reports SBP 117 recently; she was diagnosed with severe OSA summer 2022, has not been able to use CPAP due to discomfort Current regimen:  Amlodipine 10 mg daily Metoprolol succinate 100 mg daily -Counseled on medication compliance -Recommend to continue current medication  Hyperlipidemia / CAD (Goal: LDL < 70) Controlled - most recent LDL at goal however she is overdue for repeat - labs were ordered per cardiology 8/29 but pt forgot to stay for labwork; pt endorses compliance with statin; aspirin was recently increased to 325 by neurologist, concern for high risk vascular ischemia -Hx PAD, carotid artery disease, coronary atherosclerosis Current regimen:  Rosuvastatin 40 mg daily Nitroglycerin 0.4 mg SL prn Aspirin 325 mg daily -Discussed cholesterol goals and benefits of medication for prevention of heart attack / stroke -Advised to follow up with cardiology for repeat lipid panel, LFTs  Patient Goals/Self-Care Activities Patient will:  - take medications as prescribed -focus on medication adherence by routine -Follow up with cardiology for labwork -follow up with neurologist regarding CPAP alternatives  Patient verbalizes understanding of instructions provided today and agrees to view in [place].  Telephone follow up appointment with pharmacy team member scheduled for: 6 months  Charlene Brooke, PharmD, [place], CPP Clinical Pharmacist [facility] at [facility] [phone]

## 2021-07-11 DIAGNOSIS — I2510 Atherosclerotic heart disease of native coronary artery without angina pectoris: Secondary | ICD-10-CM

## 2021-07-11 DIAGNOSIS — I10 Essential (primary) hypertension: Secondary | ICD-10-CM

## 2021-07-11 DIAGNOSIS — E7800 Pure hypercholesterolemia, unspecified: Secondary | ICD-10-CM | POA: Diagnosis not present

## 2021-07-11 LAB — HEPATIC FUNCTION PANEL
ALT: 19 IU/L (ref 0–32)
AST: 25 IU/L (ref 0–40)
Albumin: 4.5 g/dL (ref 3.8–4.8)
Alkaline Phosphatase: 98 IU/L (ref 44–121)
Bilirubin Total: 0.8 mg/dL (ref 0.0–1.2)
Bilirubin, Direct: 0.18 mg/dL (ref 0.00–0.40)
Total Protein: 7.2 g/dL (ref 6.0–8.5)

## 2021-07-11 LAB — LIPID PANEL
Chol/HDL Ratio: 2.6 ratio (ref 0.0–4.4)
Cholesterol, Total: 141 mg/dL (ref 100–199)
HDL: 54 mg/dL (ref >39)
LDL Chol Calc (NIH): 76 mg/dL (ref 0–99)
Triglycerides: 50 mg/dL (ref 0–149)
VLDL Cholesterol Cal: 11 mg/dL (ref 5–40)

## 2021-07-20 ENCOUNTER — Other Ambulatory Visit (HOSPITAL_COMMUNITY): Payer: Self-pay | Admitting: Cardiology

## 2021-07-20 DIAGNOSIS — I6523 Occlusion and stenosis of bilateral carotid arteries: Secondary | ICD-10-CM

## 2021-07-26 ENCOUNTER — Ambulatory Visit: Payer: Medicare Other | Admitting: Family Medicine

## 2021-08-15 ENCOUNTER — Ambulatory Visit (HOSPITAL_COMMUNITY)
Admission: RE | Admit: 2021-08-15 | Discharge: 2021-08-15 | Disposition: A | Discharge status: Home / Self Care | Payer: Medicare Other | Source: Ambulatory Visit | Attending: Cardiology | Admitting: Cardiology

## 2021-08-15 ENCOUNTER — Other Ambulatory Visit: Payer: Self-pay

## 2021-08-15 DIAGNOSIS — I6523 Occlusion and stenosis of bilateral carotid arteries: Secondary | ICD-10-CM | POA: Insufficient documentation

## 2021-08-23 ENCOUNTER — Other Ambulatory Visit: Payer: Self-pay

## 2021-08-23 ENCOUNTER — Inpatient Hospital Stay: Payer: Medicare Other | Attending: Oncology

## 2021-08-23 ENCOUNTER — Inpatient Hospital Stay (HOSPITAL_BASED_OUTPATIENT_CLINIC_OR_DEPARTMENT_OTHER): Payer: Medicare Other | Admitting: Oncology

## 2021-08-23 VITALS — BP 124/64 | HR 66 | Temp 97.9°F | Resp 16 | Ht 63.0 in | Wt 153.5 lb

## 2021-08-23 DIAGNOSIS — Z171 Estrogen receptor negative status [ER-]: Secondary | ICD-10-CM

## 2021-08-23 DIAGNOSIS — C50112 Malignant neoplasm of central portion of left female breast: Secondary | ICD-10-CM

## 2021-08-23 DIAGNOSIS — Z9013 Acquired absence of bilateral breasts and nipples: Secondary | ICD-10-CM | POA: Diagnosis not present

## 2021-08-23 DIAGNOSIS — Z86000 Personal history of in-situ neoplasm of breast: Secondary | ICD-10-CM | POA: Insufficient documentation

## 2021-08-23 DIAGNOSIS — Z923 Personal history of irradiation: Secondary | ICD-10-CM | POA: Diagnosis not present

## 2021-08-23 DIAGNOSIS — D0512 Intraductal carcinoma in situ of left breast: Secondary | ICD-10-CM | POA: Diagnosis not present

## 2021-08-23 LAB — CBC WITH DIFFERENTIAL/PLATELET
Abs Immature Granulocytes: 0.02 10*3/uL (ref 0.00–0.07)
Basophils Absolute: 0 10*3/uL (ref 0.0–0.1)
Basophils Relative: 0 %
Eosinophils Absolute: 0.1 10*3/uL (ref 0.0–0.5)
Eosinophils Relative: 1 %
HCT: 40.4 % (ref 36.0–46.0)
Hemoglobin: 13.6 g/dL (ref 12.0–15.0)
Immature Granulocytes: 0 %
Lymphocytes Relative: 38 %
Lymphs Abs: 2.8 10*3/uL (ref 0.7–4.0)
MCH: 32 pg (ref 26.0–34.0)
MCHC: 33.7 g/dL (ref 30.0–36.0)
MCV: 95.1 fL (ref 80.0–100.0)
Monocytes Absolute: 0.6 10*3/uL (ref 0.1–1.0)
Monocytes Relative: 8 %
Neutro Abs: 3.7 10*3/uL (ref 1.7–7.7)
Neutrophils Relative %: 53 %
Platelets: 153 10*3/uL (ref 150–400)
RBC: 4.25 MIL/uL (ref 3.87–5.11)
RDW: 12.3 % (ref 11.5–15.5)
WBC: 7.2 10*3/uL (ref 4.0–10.5)
nRBC: 0 % (ref 0.0–0.2)

## 2021-08-23 LAB — COMPREHENSIVE METABOLIC PANEL
ALT: 19 U/L (ref 0–44)
AST: 26 U/L (ref 15–41)
Albumin: 4 g/dL (ref 3.5–5.0)
Alkaline Phosphatase: 82 U/L (ref 38–126)
Anion gap: 8 (ref 5–15)
BUN: 18 mg/dL (ref 8–23)
CO2: 25 mmol/L (ref 22–32)
Calcium: 9.2 mg/dL (ref 8.9–10.3)
Chloride: 107 mmol/L (ref 98–111)
Creatinine, Ser: 0.94 mg/dL (ref 0.44–1.00)
GFR, Estimated: >60 mL/min (ref >60)
Glucose, Bld: 88 mg/dL (ref 70–99)
Potassium: 3.7 mmol/L (ref 3.5–5.1)
Sodium: 140 mmol/L (ref 135–145)
Total Bilirubin: 0.9 mg/dL (ref 0.3–1.2)
Total Protein: 7.9 g/dL (ref 6.5–8.1)
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
## 2021-09-24 ENCOUNTER — Other Ambulatory Visit: Payer: Self-pay | Admitting: Cardiology

## 2021-09-24 DIAGNOSIS — R0602 Shortness of breath: Secondary | ICD-10-CM

## 2021-09-24 DIAGNOSIS — I10 Essential (primary) hypertension: Secondary | ICD-10-CM

## 2021-09-24 DIAGNOSIS — I2510 Atherosclerotic heart disease of native coronary artery without angina pectoris: Secondary | ICD-10-CM

## 2021-09-24 DIAGNOSIS — E7800 Pure hypercholesterolemia, unspecified: Secondary | ICD-10-CM

## 2021-09-27 ENCOUNTER — Other Ambulatory Visit (HOSPITAL_COMMUNITY): Payer: Self-pay | Admitting: Cardiology

## 2021-09-27 DIAGNOSIS — I6523 Occlusion and stenosis of bilateral carotid arteries: Secondary | ICD-10-CM

## 2021-10-12 ENCOUNTER — Telehealth: Payer: Self-pay | Admitting: Cardiology

## 2021-10-12 DIAGNOSIS — E7800 Pure hypercholesterolemia, unspecified: Secondary | ICD-10-CM

## 2021-10-12 DIAGNOSIS — R0602 Shortness of breath: Secondary | ICD-10-CM

## 2021-10-12 DIAGNOSIS — I2510 Atherosclerotic heart disease of native coronary artery without angina pectoris: Secondary | ICD-10-CM

## 2021-10-12 DIAGNOSIS — I10 Essential (primary) hypertension: Secondary | ICD-10-CM

## 2021-10-12 MED ORDER — METOPROLOL SUCCINATE ER 100 MG PO TB24: ORAL_TABLET | ORAL | 0 refills | Status: DC

## 2021-10-12 NOTE — Telephone Encounter (Signed)
Refill sent to the pharmacy electronically.  

## 2021-10-12 NOTE — Addendum Note (Signed)
Addended by: Cristopher Estimable on: 10/12/2021 04:58 PM   Modules accepted: Orders

## 2021-10-12 NOTE — Telephone Encounter (Signed)
Spoke with patient regarding metoprolol refills. It was sent in on 1/16. Pt asked if she should still take aspirin 325 mg or go back to 81mg . She stated her neurologist put her on 325 to help clear her eye issue, which has been resolved. I recommended that she contact her neurologist. She stated she would have to make an appointment for the dose to change. Please advise cardiac wise.

## 2021-10-13 ENCOUNTER — Other Ambulatory Visit: Payer: Self-pay | Admitting: Cardiology

## 2021-10-13 ENCOUNTER — Other Ambulatory Visit: Payer: Self-pay

## 2021-10-13 DIAGNOSIS — R0602 Shortness of breath: Secondary | ICD-10-CM

## 2021-10-13 DIAGNOSIS — E7800 Pure hypercholesterolemia, unspecified: Secondary | ICD-10-CM

## 2021-10-13 DIAGNOSIS — I2510 Atherosclerotic heart disease of native coronary artery without angina pectoris: Secondary | ICD-10-CM

## 2021-10-13 DIAGNOSIS — I10 Essential (primary) hypertension: Secondary | ICD-10-CM

## 2021-10-13 MED ORDER — ASPIRIN EC 81 MG PO TBEC: 81.0000 mg | DELAYED_RELEASE_TABLET | Freq: Every day | ORAL | 3 refills | Status: AC

## 2021-10-13 NOTE — Telephone Encounter (Signed)
Spoke with patient to inform her that aspirin 81mg  enteric coated is ok to take from a cardiac standpoint. She asked for the prescription sent to her pharmacy. Done.

## 2021-10-13 NOTE — Telephone Encounter (Signed)
Prescription for Metoprolol succinate was filled  on Feb 1. 2023.  Today request denied

## 2021-10-21 ENCOUNTER — Other Ambulatory Visit: Payer: Self-pay

## 2021-10-21 ENCOUNTER — Ambulatory Visit
Admission: EM | Admit: 2021-10-21 | Discharge: 2021-10-21 | Disposition: A | Discharge status: Home / Self Care | Payer: Medicare Other | Attending: Internal Medicine | Admitting: Internal Medicine

## 2021-10-21 DIAGNOSIS — M542 Cervicalgia: Secondary | ICD-10-CM

## 2021-10-21 DIAGNOSIS — M545 Low back pain, unspecified: Secondary | ICD-10-CM | POA: Diagnosis not present

## 2021-10-21 NOTE — Discharge Instructions (Signed)
Please go to [facility] to have x-ray completed.  You will enter through the emergency department and ask for the imaging center.  Please do not sign into the emergency department.  Go to the imaging center and have x-ray completed.  We will call you if there are any abnormalities on x-ray.
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
## 2021-10-22 ENCOUNTER — Other Ambulatory Visit (HOSPITAL_BASED_OUTPATIENT_CLINIC_OR_DEPARTMENT_OTHER): Admission: RE | Admit: 2021-10-22 | Payer: Medicare Other | Source: Ambulatory Visit | Admitting: Emergency Medicine

## 2021-10-22 ENCOUNTER — Ambulatory Visit (HOSPITAL_BASED_OUTPATIENT_CLINIC_OR_DEPARTMENT_OTHER)
Admission: RE | Admit: 2021-10-22 | Discharge: 2021-10-22 | Disposition: A | Discharge status: Home / Self Care | Payer: Medicare Other | Source: Ambulatory Visit | Attending: Internal Medicine | Admitting: Internal Medicine

## 2021-10-22 DIAGNOSIS — Z041 Encounter for examination and observation following transport accident: Secondary | ICD-10-CM | POA: Insufficient documentation

## 2021-10-22 DIAGNOSIS — M5136 Other intervertebral disc degeneration, lumbar region: Secondary | ICD-10-CM | POA: Insufficient documentation

## 2021-10-22 DIAGNOSIS — M47816 Spondylosis without myelopathy or radiculopathy, lumbar region: Secondary | ICD-10-CM | POA: Diagnosis not present

## 2021-10-25 ENCOUNTER — Ambulatory Visit (INDEPENDENT_AMBULATORY_CARE_PROVIDER_SITE_OTHER): Payer: Medicare Other | Admitting: Family Medicine

## 2021-10-25 ENCOUNTER — Ambulatory Visit (HOSPITAL_COMMUNITY)
Admission: RE | Admit: 2021-10-25 | Discharge: 2021-10-25 | Disposition: A | Discharge status: Home / Self Care | Payer: Medicare Other | Source: Ambulatory Visit | Attending: Cardiology | Admitting: Cardiology

## 2021-10-25 ENCOUNTER — Other Ambulatory Visit: Payer: Self-pay

## 2021-10-25 VITALS — BP 136/82 | HR 66 | Ht 63.0 in | Wt 154.8 lb

## 2021-10-25 DIAGNOSIS — M545 Low back pain, unspecified: Secondary | ICD-10-CM | POA: Diagnosis not present

## 2021-10-25 DIAGNOSIS — I739 Peripheral vascular disease, unspecified: Secondary | ICD-10-CM | POA: Diagnosis present

## 2021-10-25 DIAGNOSIS — R202 Paresthesia of skin: Secondary | ICD-10-CM | POA: Diagnosis not present

## 2021-10-25 DIAGNOSIS — G8929 Other chronic pain: Secondary | ICD-10-CM

## 2021-10-25 NOTE — Patient Instructions (Addendum)
Thank you for coming in today.   I've referred you to Physical Therapy.  Let us know if you don't hear from them in one week.   [place], [address], appointment is at 11:00 today.  Recheck back in 6-8 weeks
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
## 2021-10-25 NOTE — Progress Notes (Signed)
The vascular study preliminary results look normal.  No evidence of restriction to the artery blood flow to your legs.  This is a preliminary result and the final result should be back probably tomorrow.

## 2021-10-26 ENCOUNTER — Encounter: Payer: Self-pay | Admitting: Family Medicine

## 2021-10-26 NOTE — Progress Notes (Signed)
No acute abnormality on x-ray.  Patient was called and notified of result.  She reports that she has been seeing her orthopedist and has physical therapy scheduled. No Change to plan at this time.

## 2021-10-26 NOTE — Progress Notes (Signed)
Final results came back from the arterial blood flow study for the legs.  No evidence of restricted blood flow to the legs.  The stent is working appropriately.

## 2021-11-02 ENCOUNTER — Ambulatory Visit: Payer: Medicare Other | Admitting: Physical Therapy
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
## 2021-11-03 ENCOUNTER — Ambulatory Visit (INDEPENDENT_AMBULATORY_CARE_PROVIDER_SITE_OTHER): Payer: Medicare Other | Admitting: Physical Therapy

## 2021-11-03 ENCOUNTER — Encounter: Payer: Self-pay | Admitting: Physical Therapy

## 2021-11-03 ENCOUNTER — Other Ambulatory Visit: Payer: Self-pay

## 2021-11-03 DIAGNOSIS — M5441 Lumbago with sciatica, right side: Secondary | ICD-10-CM

## 2021-11-03 DIAGNOSIS — M6281 Muscle weakness (generalized): Secondary | ICD-10-CM | POA: Diagnosis not present

## 2021-11-08 ENCOUNTER — Encounter: Payer: Medicare Other | Admitting: Physical Therapy

## 2021-11-09 ENCOUNTER — Other Ambulatory Visit: Payer: Self-pay

## 2021-11-09 ENCOUNTER — Ambulatory Visit (INDEPENDENT_AMBULATORY_CARE_PROVIDER_SITE_OTHER): Payer: Medicare Other | Admitting: Physical Therapy

## 2021-11-09 ENCOUNTER — Encounter: Payer: Self-pay | Admitting: Physical Therapy

## 2021-11-09 DIAGNOSIS — M5441 Lumbago with sciatica, right side: Secondary | ICD-10-CM

## 2021-11-09 DIAGNOSIS — M6281 Muscle weakness (generalized): Secondary | ICD-10-CM

## 2021-11-09 NOTE — Therapy (Deleted)
?OUTPATIENT PHYSICAL THERAPY TREATMENT NOTE ? ? ?Patient Name: Brenda [name] ?MRN: [id number] ?DOB:1955-12-08, 66 y.o., female ?Today's Date: 11/09/2021 ? ?PCP: [name], MD ?REFERRING PROVIDER: Gregor Hams, MD ? ? ?Past Medical History:  ?Diagnosis Date  ? Alcoholism ([place])   ? Quit 1998  ? Allergy   ? Anemia   ? Anxiety   ? Breast cancer, left [facility]) oncologist-- dr Jana Hakim  ? 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 Carotid artery stenosis   ? bilateral---  04-27-2000  s/p  left carotid endarterectomy:  last doppler in epic 08-13-2018  right ICA 40-59%,  right ECA >50%,  left ICA 1-39% ,  bilateral subclavians stenotic  ? CHF (congestive heart failure) ([place])   ? Chronic stable angina (HCC)   ? followed by cardiology  ? Complication of anesthesia   ? PONV  ? Coronary artery disease cardiologist-- dr Stanford Breed  ? 06-12-2000  s/p  CABG x5  :  cardiac cath 02-21-2018 --  normal LVF, 80% LM, occluded RCA, 95% Ramus and 100% OM,  Patent seqSVG to  PLA-PDA & LIMA to LAD,  seqSVG to OM and RI occluded  ? Depression   ? Family history of lung cancer   ? History of blood transfusion   ? History of CVA with residual deficit   ? short term memory loss per pt  ? History of Helicobacter pylori infection 2001  ? Hyperlipidemia   ? Hypertension   ? Hyperthyroidism   ? dx yrs ago, per pt have taken medication for awhile  ? Mild mitral regurgitation   ? per echo 06/ 2019  ? Peripheral vascular disease ([place])   ? managed by vascular-- dr Donzetta Matters  ? PONV (postoperative nausea and vomiting)   ? S/P CABG x 5 06-12-2000   by dr Lucianne Lei tright _0   ? Severe obstructive sleep apnea-hypopnea syndrome 02/25/2021  ? Stroke [facility]) 2002  ? mild memory loss  ? ?Past Surgical History:  ?Procedure Laterality Date  ? ABDOMINAL AORTOGRAM W/LOWER EXTREMITY N/A 06/13/2017  ?  Procedure: ABDOMINAL AORTOGRAM W/LOWER EXTREMITY;  Surgeon: Waynetta Sandy, MD;  Location: [place] [facility];  Service: Cardiovascular;  Laterality: N/A;  Bilateral  ? ABDOMINAL HYSTERECTOMY  1998  ? BREAST LUMPECTOMY WITH AXILLARY LYMPH NODE DISSECTION Left 1999  ? CARDIAC CATHETERIZATION  04-24-2000  dr Caryl Comes  ? 3V CAD , subtotal PDA w/ slow flow colleterals , ef 48%  ? CARDIAC CATHETERIZATION  06/11/2000  ? 3V CAD with progression of LAD disease  ? CAROTID ENDARTERECTOMY Left 04-27-2000   dr Amedeo Plenty  _1   ? COLONOSCOPY  06/24/2019  ? Danis  ? CORONARY ARTERY BYPASS GRAFT  06-12-2000   dr Lucianne Lei tright _2   ? x 5 (left internal mammary artery to left anterior  descending coronary artery  ? LEFT HEART CATH AND CORS/GRAFTS ANGIOGRAPHY N/A 02/21/2018  ? Procedure: LEFT HEART CATH AND CORS/GRAFTS ANGIOGRAPHY;  Surgeon: Lorretta Harp, MD;  Location: [place] [facility];  Service: Cardiovascular;  Laterality: N/A;  ? PERIPHERAL VASCULAR INTERVENTION  06/13/2017  ? Procedure: PERIPHERAL VASCULAR INTERVENTION;  Surgeon: Waynetta Sandy, MD;  Location: [place] [facility];  Service: Cardiovascular;;  Bilateral Iliac Stents  ? ROBOTIC ASSISTED SALPINGO OOPHERECTOMY Bilateral 03/13/2019  ? Procedure: XI ROBOTIC ASSISTED BILATERAL  SALPINGO OOPHORECTOMY;  Surgeon: Everitt Amber, MD;  Location: [facility];  Service: Gynecology;  Laterality: Bilateral;  ? TOTAL MASTECTOMY Bilateral 11/07/2018  ? Procedure: BILATERAL MASTECTOMIES;  Surgeon: Stark Klein, MD;  Location: [place];  Service: General;  Laterality: Bilateral;  ? ?Patient Active Problem List  ? Diagnosis Date Noted  ? Vision loss 04/08/2021  ? Optic neuritis, retrobulbar (acute), left 03/29/2021  ? Severe obstructive sleep apnea-hypopnea syndrome 02/25/2021  ? PAD (peripheral artery disease) ([place]) 01/31/2021  ? Memory deficit following unspecified cerebrovascular disease 01/31/2021  ? Snoring 01/31/2021  ? Excessive  daytime sleepiness 01/31/2021  ? Cognitive deficit with impaired visuospatial function 01/31/2021  ? Adjustment disorder 12/20/2019  ? Pancreatic insufficiency 05/30/2019  ? Encounter for general adult medical examination with abnormal findings 05/30/2019  ? Monoallelic mutation of PALB2 gene 10/25/2018  ? Genetic testing 10/25/2018  ? Malignant neoplasm of central portion of left breast in female, estrogen receptor negative ([place]) 10/08/2018  ? 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/08/2018  ? Loss of transverse plantar arch of left foot 07/19/2018  ? Hypothyroidism 03/02/2018  ? Lesion of pancreas 06/22/2017  ? Constipation 06/22/2017  ? Peripheral vascular disease ([place]) 06/01/2017  ? Left leg pain 05/10/2017  ? Allergic rhinitis 02/25/2016  ? Low back pain 09/29/2015  ? Carpal tunnel syndrome, right 08/20/2015  ? Hx of completed stroke 05/21/2015  ? Memory impairment 05/21/2015  ? Hyperthyroidism 12/29/2008  ? HYPERCHOLESTEROLEMIA 12/29/2008  ? Anxiety 12/29/2008  ? Essential hypertension 12/29/2008  ? Coronary atherosclerosis 12/29/2008  ? CAROTID ARTERY STENOSIS 12/29/2008  ? ? ?REFERRING DIAG: M54.50,G89.29 (ICD-10-CM) - Chronic bilateral low back pain without sciatica ? ?THERAPY DIAG:  ?No diagnosis found. ? ?PERTINENT HISTORY: *** ? ?PRECAUTIONS: *** ? ?SUBJECTIVE: *** ? ?PAIN:  ?Are you having pain? {yes/no:20286} ?NPRS scale: ***/10 ?Pain location: *** ?Pain orientation: {Pain Orientation:25161}  ?PAIN TYPE: {type:313116} ?Pain description: {PAIN DESCRIPTION:21022940}  ?Aggravating factors: *** ?Relieving factors: *** ? ? ? ? ?(Copy Eval's Objective through Plan section here) ? ? ?Jamey Reas, PT ?11/09/2021, 8:27 AM ? ?   ?

## 2021-11-09 NOTE — Therapy (Signed)
OUTPATIENT PHYSICAL THERAPY TREATMENT NOTE   Patient Name: Brenda [name] MRN: [id number] DOB:Nov 21, 1955, 66 y.o., female Today's Date: 11/10/2021  PCP: [name], MD REFERRING PROVIDER: Gregor Hams, MD   PT End of Session - 11/09/21 1550     Visit Number 2    Number of Visits 12    Date for PT Re-Evaluation 12/15/21    PT Start Time 0230    PT Stop Time 0311    PT Time Calculation (min) 41 min    Activity Tolerance Patient tolerated treatment well    Behavior During Therapy [facility] for tasks assessed/performed             Past Medical History:  Diagnosis Date   Alcoholism ([place])    Quit 1998   Allergy    Anemia    Anxiety    Breast cancer, left [facility]) oncologist-- dr Jana Hakim   dx 1999,  DCIS;  s/p  left lumpectomy w/ node dissection's;  completed chemo and radiation 2000:  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, Grade 2, Stage 0 (ER/PR +);  11-07-2018  s/p  bilateral mastectomies   Carotid artery stenosis    bilateral---  04-27-2000  s/p  left carotid endarterectomy:  last doppler in epic 08-13-2018  right ICA 40-59%,  right ECA >50%,  left ICA 1-39% ,  bilateral subclavians stenotic   CHF (congestive heart failure) ([place])    Chronic stable angina ([place])    followed by cardiology   Complication of anesthesia    PONV   Coronary artery disease cardiologist-- dr Stanford Breed   06-12-2000  s/p  CABG x5  :  cardiac cath 02-21-2018 --  normal LVF, 80% LM, occluded RCA, 95% Ramus and 100% OM,  Patent seqSVG to  PLA-PDA & LIMA to LAD,  seqSVG to OM and RI occluded   Depression    Family history of lung cancer    History of blood transfusion    History of CVA with residual deficit    short term memory loss per pt   History of Helicobacter pylori infection 2001   Hyperlipidemia    Hypertension    Hyperthyroidism    dx yrs ago, per pt have taken medication for awhile   Mild mitral regurgitation    per echo 06/ 2019   Peripheral vascular disease ([place])    managed  by vascular-- dr Donzetta Matters   PONV (postoperative nausea and vomiting)    S/P CABG x 5 06-12-2000   by dr Lucianne Lei tright @[facility]    Severe obstructive sleep apnea-hypopnea syndrome 02/25/2021   Stroke ([place]) 2002   mild memory loss   Past Surgical History:  Procedure Laterality Date   ABDOMINAL AORTOGRAM W/LOWER EXTREMITY N/A 06/13/2017   Procedure: ABDOMINAL AORTOGRAM W/LOWER EXTREMITY;  Surgeon: Waynetta Sandy, MD;  Location: [facility];  Service: Cardiovascular;  Laterality: N/A;  Bilateral   ABDOMINAL HYSTERECTOMY  1998   BREAST LUMPECTOMY WITH AXILLARY LYMPH NODE DISSECTION Left 1999   CARDIAC CATHETERIZATION  04-24-2000  dr Caryl Comes   3V CAD , subtotal PDA w/ slow flow colleterals , ef 48%   CARDIAC CATHETERIZATION  06/11/2000   3V CAD with progression of LAD disease   CAROTID ENDARTERECTOMY Left 04-27-2000   dr Amedeo Plenty  @[facility]    COLONOSCOPY  06/24/2019   [facility]   CORONARY ARTERY BYPASS GRAFT  06-12-2000   dr Lucianne Lei tright @[facility]    x 5 (left internal mammary artery to left anterior  descending coronary artery  LEFT HEART CATH AND CORS/GRAFTS ANGIOGRAPHY N/A 02/21/2018   Procedure: LEFT HEART CATH AND CORS/GRAFTS ANGIOGRAPHY;  Surgeon: Lorretta Harp, MD;  Location: [facility];  Service: Cardiovascular;  Laterality: N/A;   PERIPHERAL VASCULAR INTERVENTION  06/13/2017   Procedure: PERIPHERAL VASCULAR INTERVENTION;  Surgeon: Waynetta Sandy, MD;  Location: [facility];  Service: Cardiovascular;;  Bilateral Iliac Stents   ROBOTIC ASSISTED SALPINGO OOPHERECTOMY Bilateral 03/13/2019   Procedure: XI ROBOTIC ASSISTED BILATERAL  SALPINGO OOPHORECTOMY;  Surgeon: Everitt Amber, MD;  Location: [facility];  Service: Gynecology;  Laterality: Bilateral;   TOTAL MASTECTOMY Bilateral 11/07/2018   Procedure: BILATERAL MASTECTOMIES;  Surgeon: Stark Klein, MD;  Location: [facility];  Service: General;  Laterality: Bilateral;   Patient Active Problem List    Diagnosis Date Noted   Vision loss 04/08/2021   Optic neuritis, retrobulbar (acute), left 03/29/2021   Severe obstructive sleep apnea-hypopnea syndrome 02/25/2021   PAD (peripheral artery disease) ([place]) 01/31/2021   Memory deficit following unspecified cerebrovascular disease 01/31/2021   Snoring 01/31/2021   Excessive daytime sleepiness 01/31/2021   Cognitive deficit with impaired visuospatial function 01/31/2021   Adjustment disorder 12/20/2019   Pancreatic insufficiency 05/30/2019   Encounter for general adult medical examination with abnormal findings 93/55/2174   Monoallelic mutation of PALB2 gene 10/25/2018   Genetic testing 10/25/2018   Malignant neoplasm of central portion of left breast in female, estrogen receptor negative ([place]) 10/08/2018   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/08/2018   Loss of transverse plantar arch of left foot 07/19/2018   Hypothyroidism 03/02/2018   Lesion of pancreas 06/22/2017   Constipation 06/22/2017   Peripheral vascular disease ([place]) 06/01/2017   Left leg pain 05/10/2017   Allergic rhinitis 02/25/2016   Low back pain 09/29/2015   Carpal tunnel syndrome, right 08/20/2015   Hx of completed stroke 05/21/2015   Memory impairment 05/21/2015   Hyperthyroidism 12/29/2008   HYPERCHOLESTEROLEMIA 12/29/2008   Anxiety 12/29/2008   Essential hypertension 12/29/2008   Coronary atherosclerosis 12/29/2008   CAROTID ARTERY STENOSIS 12/29/2008    PCP: [name], MD   REFERRING PROVIDER: Gregor Hams, MD   REFERRING DIAG: [phone] (ICD-10-CM) - Chronic bilateral low back pain without sciatica   THERAPY DIAG:  Acute bilateral low back pain with right-sided sciatica   Muscle weakness (generalized)   ONSET DATE: 10/21/2021   SUBJECTIVE:  SUBJECTIVE STATEMENT: Pt reports mild pain in the Lft hip today after walking 1.5 miles and working part time yesterday. She reports HEP compliance and that the exercises helped to decrease her pain. She also reports using heat on her low back last night which helped as well.     PERTINENT HISTORY:  Anxiety, Breast cancer, left ([place]), Carotid artery stenosis, CHF (congestive heart failure) ([facility]), Chronic stable angina ([place]), Coronary artery disease, Depression, History of CVA with residual deficit,  Peripheral vascular disease ([place]), Stroke ([place])   PAIN:  Are you having pain? Yes NPRS scale: Mild Pain location: Low back, bilat hips Pain orientation: Right, Left, and Lower  PAIN TYPE: aching, throbbing, and tight Pain description: intermittent  Aggravating factors: Stair movements, walking longer distances  Relieving factors: Heat    PRECAUTIONS: None   WEIGHT BEARING RESTRICTIONS No   FALLS:  Has patient fallen in last 6 months? 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 2. Mild degenerative disc disease and facet arthropathy   PATIENT SURVEYS:  FOTO 53%   SCREENING FOR RED FLAGS: Bowel or bladder incontinence: No Spinal tumors: No Cauda equina syndrome: No Compression fracture: No Abdominal aneurysm: No   COGNITION:          Overall cognitive status: Within functional limits for tasks assessed                  MUSCLE LENGTH: Hamstrings: Right 85 deg; Left 85 deg Modified thomas test (leg off side of bed): Right 82 deg; Left 90 deg   PALPATION: Triggerpoint/ Tightness noted around piriformis bilat   LUMBARAROM/PROM   A/PROM A/PROM  11/03/2021  Flexion Full and  pain free  Extension 75% of full ext w/ concordant pain  Right lateral flexion 50% of full ROM   Left lateral flexion 50% of full ROM   Right rotation 50% of full ROM with concordant pain  Left rotation 25% of full ROM    (Blank rows = not tested)     LE MMT:   MMT Right 11/03/2021 Left 11/03/2021  Hip flexion 4- 4-  Hip extension 4- 4-  Hip abduction 4- 4-  Hip adduction 3+ 3+  Hip internal rotation 4+ 4+  Hip external rotation 4+ 4+   (Blank rows = not tested)   LUMBAR SPECIAL TESTS:  Slump test: Negative for radicular sx, positive for hamstring tightness, Quadrant test: Negative, and Thomas test: Positive   11/09/2021  Therapeutic Exercise:  Aerobic: Nustep Lvl 4 Seat 6 x 8 minutes Supine:Bridges with ball squeeze 2 x 10; lumbar rotation bilat x 10;  Prone:  Seated: Slump stretch x 8 bilat;  Standing: Gastroc stretch 3 x 30 sec; Hip abduction and extension bilat 2 x 10 2.5#; lumbar ext x 10 Neuromuscular Re-education: Manual Therapy: Percussive device to bilat hip abductors, glutes, and IT band Therapeutic Activity: Self Care: Trigger Point Dry Needling:  Modalities:     TREATMENT 11/03/2021 Reviewed pt's HEP       PATIENT EDUCATION:  Education details: HEP Person educated: Patient Education method: Consulting civil engineer, Media planner, Verbal cues, and Handouts Education comprehension: verbalized understanding, verbal cues required, and needs further education  HOME EXERCISE PROGRAM: Access Code: [email or web address] URL: [email or web address] Date: 11/03/2021 Prepared by: Elsie Ra   Exercises Supine Hamstring Stretch with Strap - 2 x daily - 6 x weekly - 1 sets - 3 reps - 30 hold Supine Quadriceps Stretch with Strap on Table - 2 x daily - 6 x weekly - 2-3 reps - 30 hold Supine Figure 4 Piriformis Stretch - 2 x daily - 6 x weekly - 1 sets - 3 reps - 30 hold Supine Bridge - 2 x daily - 6 x weekly - 1-2 sets - 10 reps - 5 hold lumbar-thoracic extension  mobilization with movement using towel - 2 x daily - 6 x weekly - 1-2 sets - 10 reps - 5 hold Standing Hip Abduction with Resistance at Ankles and Counter Support - 2 x daily - 6 x weekly - 3 sets - 10 reps Standing Hip Extension with Resistance at Ankles and Counter Support - 2 x daily - 6 x weekly - 3 sets - 10 reps     ASSESSMENT:   CLINICAL IMPRESSION: Session focused on bilat hip strengthening, increasing lumbar ROM, and decreasing radicular sx in the LE's. Pt did not have radicular sx at the beginning of the session, however with exercise radicular sx became present. Slump stretch and repeated lumbar ext exercises were performed today to help decrease these radicular sx, and showed improvements with the pt's sx. Percussive device was also utilized today to help with muscle tightness around bilat hips, pt tolerated this treatment well.    OBJECTIVE IMPAIRMENTS: decreased activity tolerance, difficulty walking, decreased balance, decreased endurance, decreased mobility, decreased ROM, decreased strength, impaired flexibility, impaired LE use,  and pain.   ACTIVITY LIMITATIONS: bending, lifting, carry, locomotion, cleaning, community activity, and occupation   PERSONAL FACTORS: Anxiety, Breast cancer, left ([place]), Carotid artery stenosis, CHF (congestive heart failure) ([place]), Chronic stable angina ([place]), Coronary artery disease, Depression, History of CVA with residual deficit,  Peripheral vascular disease ([place]), Stroke (H[facility])are also affecting patient's functional outcome.     REHAB POTENTIAL: Good   CLINICAL DECISION MAKING: stable/uncomplicated   EVALUATION COMPLEXITY: Low       GOALS: Short term PT Goals (target date for Short term goals are 4 weeks 12/01/21) Pt will be I and compliant with HEP. Baseline:  Goal status: New, ongoing, met Pt will decrease pain by 25% overall Baseline: Goal status: New   Long term PT goals (target dates for all long term goals are 6 weeks  12/15/21) Pt will improve lumbar ROM to [facility] to improve functional mobility Baseline: Goal status: New Pt will improve  hip strength to at least 4+/5 MMT to improve functional strength Baseline: Goal status: New Pt will improve FOTO to at least 68% functional to show improved function Baseline: Goal status: New Pt will reduce pain to overall less than 2-3/10 with usual activity and work activity. Baseline: Goal status: New Pt will be able to return to walking previous mileage WNL gait pattern without complaints Baseline: Goal status: New   PLAN: PT FREQUENCY: 1-2 times per week    PT DURATION: 6 weeks   PLANNED INTERVENTIONS (unless contraindicated): aquatic PT, cryotherapy, Electrical stimulation, Iontophoresis with 4 mg/ml dexamethasome, Moist heat, traction, Ultrasound, gait training, Therapeutic exercise, balance training, neuromuscular re-education, patient/family education, manual techniques, passive ROM, dry needling, taping, vasopnuematic device, spinal manipulations, joint manipulations   PLAN FOR NEXT SESSION: Reassess pt's response to percussive device, update HEP, progress hip strengthening and lumbar ROM exercises.  Ysidro Evert,  SPT

## 2021-11-14 ENCOUNTER — Other Ambulatory Visit: Payer: Self-pay

## 2021-11-14 ENCOUNTER — Ambulatory Visit (INDEPENDENT_AMBULATORY_CARE_PROVIDER_SITE_OTHER): Payer: Medicare Other | Admitting: Physical Therapy

## 2021-11-14 ENCOUNTER — Encounter: Payer: Self-pay | Admitting: Physical Therapy

## 2021-11-14 DIAGNOSIS — M5441 Lumbago with sciatica, right side: Secondary | ICD-10-CM | POA: Diagnosis not present

## 2021-11-14 DIAGNOSIS — M6281 Muscle weakness (generalized): Secondary | ICD-10-CM | POA: Diagnosis not present

## 2021-11-14 NOTE — Therapy (Signed)
°OUTPATIENT PHYSICAL THERAPY TREATMENT NOTE ° ° °Patient Name: Brenda [name] °MRN: [id number] °DOB:01/03/1956, 65 y.o., female °Today's Date: 11/14/2021 ° °PCP: Crawford, Elizabeth A, MD °REFERRING PROVIDER: Corey, Evan S, MD ° ° PT End of Session - 11/14/21 1017   ° ° Visit Number 3   ° Number of Visits 12   ° Date for PT Re-Evaluation 12/15/21   ° PT Start Time 1015   ° PT Stop Time 1055   ° PT Time Calculation (min) 40 min   ° Activity Tolerance Patient tolerated treatment well   ° Behavior During Therapy WFL for tasks assessed/performed   ° °  °  ° °  ° ° ° °Past Medical History:  °Diagnosis Date  ° Alcoholism (HCC)   ° Quit 1998  ° Allergy   ° Anemia   ° Anxiety   ° Breast cancer, left (HCC) oncologist-- dr magrinat  ° dx 1999,  DCIS;  s/p  left lumpectomy w/ node dissection's;  completed chemo and radiation 2000:  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, Grade 2, Stage 0 (ER/PR +);  11-07-2018  s/p  bilateral mastectomies  ° Carotid artery stenosis   ° bilateral---  04-27-2000  s/p  left carotid endarterectomy:  last doppler in epic 08-13-2018  right ICA 40-59%,  right ECA >50%,  left ICA 1-39% ,  bilateral subclavians stenotic  ° CHF (congestive heart failure) (HCC)   ° Chronic stable angina (HCC)   ° followed by cardiology  ° Complication of anesthesia   ° PONV  ° Coronary artery disease cardiologist-- dr crenshaw  ° 06-12-2000  s/p  CABG x5  :  cardiac cath 02-21-2018 --  normal LVF, 80% LM, occluded RCA, 95% Ramus and 100% OM,  Patent seqSVG to  PLA-PDA & LIMA to LAD,  seqSVG to OM and RI occluded  ° Depression   ° Family history of lung cancer   ° History of blood transfusion   ° History of CVA with residual deficit   ° short term memory loss per pt  ° History of Helicobacter pylori infection 2001  ° Hyperlipidemia   ° Hypertension   ° Hyperthyroidism   ° dx yrs ago, per pt have taken medication for awhile  ° Mild mitral regurgitation   ° per echo 06/ 2019  ° Peripheral vascular disease (HCC)   °  managed by vascular-- dr cain  ° PONV (postoperative nausea and vomiting)   ° S/P CABG x 5 06-12-2000   by dr van tright @[facility]  ° Severe obstructive sleep apnea-hypopnea syndrome 02/25/2021  ° Stroke (HCC) 2002  ° mild memory loss  ° °Past Surgical History:  °Procedure Laterality Date  ° ABDOMINAL AORTOGRAM W/LOWER EXTREMITY N/A 06/13/2017  ° Procedure: ABDOMINAL AORTOGRAM W/LOWER EXTREMITY;  Surgeon: Cain, Brandon Christopher, MD;  [facility]: [facility];  Service: Cardiovascular;  Laterality: N/A;  Bilateral  ° ABDOMINAL HYSTERECTOMY  1998  ° BREAST LUMPECTOMY WITH AXILLARY LYMPH NODE DISSECTION Left 1999  ° CARDIAC CATHETERIZATION  04-24-2000  dr klein  ° 3V CAD , subtotal PDA w/ slow flow colleterals , ef 48%  ° CARDIAC CATHETERIZATION  06/11/2000  ° 3V CAD with progression of LAD disease  ° CAROTID ENDARTERECTOMY Left 04-27-2000   dr hayes  @[facility]  ° COLONOSCOPY  06/24/2019  ° [facility]  ° CORONARY ARTERY BYPASS GRAFT  06-12-2000   dr van tright @[facility]  ° x 5 (left internal mammary artery to left anterior  descending coronary artery  °   LEFT HEART CATH AND CORS/GRAFTS ANGIOGRAPHY N/A 02/21/2018  ° Procedure: LEFT HEART CATH AND CORS/GRAFTS ANGIOGRAPHY;  Surgeon: Berry, Jonathan J, MD;  [facility]: [facility];  Service: Cardiovascular;  Laterality: N/A;  ° PERIPHERAL VASCULAR INTERVENTION  06/13/2017  ° Procedure: PERIPHERAL VASCULAR INTERVENTION;  Surgeon: Cain, Brandon Christopher, MD;  [facility]: [facility];  Service: Cardiovascular;;  Bilateral Iliac Stents  ° ROBOTIC ASSISTED SALPINGO OOPHERECTOMY Bilateral 03/13/2019  ° Procedure: XI ROBOTIC ASSISTED BILATERAL  SALPINGO OOPHORECTOMY;  Surgeon: Rossi, Emma, MD;  Location: [facility];  Service: Gynecology;  Laterality: Bilateral;  ° TOTAL MASTECTOMY Bilateral 11/07/2018  ° Procedure: BILATERAL MASTECTOMIES;  Surgeon: Byerly, Faera, MD;  Location: [facility];  Service: General;  Laterality: Bilateral;  ° °Patient Active Problem  List  ° Diagnosis Date Noted  ° Vision loss 04/08/2021  ° Optic neuritis, retrobulbar (acute), left 03/29/2021  ° Severe obstructive sleep apnea-hypopnea syndrome 02/25/2021  ° PAD (peripheral artery disease) (HCC) 01/31/2021  ° Memory deficit following unspecified cerebrovascular disease 01/31/2021  ° Snoring 01/31/2021  ° Excessive daytime sleepiness 01/31/2021  ° Cognitive deficit with impaired visuospatial function 01/31/2021  ° Adjustment disorder 12/20/2019  ° Pancreatic insufficiency 05/30/2019  ° Encounter for general adult medical examination with abnormal findings 05/30/2019  ° Monoallelic mutation of PALB2 gene 10/25/2018  ° Genetic testing 10/25/2018  ° Malignant neoplasm of central portion of left breast in female, estrogen receptor negative (HCC) 10/08/2018  ° 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/08/2018  ° Loss of transverse plantar arch of left foot 07/19/2018  ° Hypothyroidism 03/02/2018  ° Lesion of pancreas 06/22/2017  ° Constipation 06/22/2017  ° Peripheral vascular disease (HCC) 06/01/2017  ° Left leg pain 05/10/2017  ° Allergic rhinitis 02/25/2016  ° Low back pain 09/29/2015  ° Carpal tunnel syndrome, right 08/20/2015  ° Hx of completed stroke 05/21/2015  ° Memory impairment 05/21/2015  ° Hyperthyroidism 12/29/2008  ° HYPERCHOLESTEROLEMIA 12/29/2008  ° Anxiety 12/29/2008  ° Essential hypertension 12/29/2008  ° Coronary atherosclerosis 12/29/2008  ° CAROTID ARTERY STENOSIS 12/29/2008  ° ° °PCP: Crawford, Elizabeth A, MD °  °REFERRING PROVIDER: Corey, Evan S, MD °  °REFERRING DIAG: M54.50,G89.29 (ICD-10-CM) - Chronic bilateral low back pain without sciatica °  °THERAPY DIAG:  °Acute bilateral low back pain with right-sided sciatica °  °Muscle weakness (generalized) °  °ONSET DATE: 10/21/2021 °  °SUBJECTIVE:                                                                                                                                                                                           °  °  SUBJECTIVE STATEMENT: Lt foot is slightly tingling; will go into work at 4:00 today.  Otherwise weekend was good.  °  °PERTINENT HISTORY:  °Anxiety, Breast cancer, left (HCC), Carotid artery stenosis, CHF (congestive heart failure) (HCC), Chronic stable angina (HCC), Coronary artery disease, Depression, History of CVA with residual deficit,  Peripheral vascular disease (HCC), Stroke (HCC) °  °PAIN:  °Are you having pain? Yes °NPRS scale: did not rate; reported Lt foot tingling °Pain location: Low back, bilat hips °Pain orientation: Right, Left, and Lower  °PAIN TYPE: aching, throbbing, and tight °Pain description: intermittent  °Aggravating factors: Stair movements, walking longer distances  °Relieving factors: Heat  °  °PRECAUTIONS: None °  °WEIGHT BEARING RESTRICTIONS No °  °FALLS:  °Has patient fallen in last 6 months? 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 2. Mild degenerative disc disease and facet arthropathy °  °PATIENT SURVEYS:  °11/03/21 FOTO 53% °  °MUSCLE LENGTH: °Hamstrings: Right 85 deg; Left 85 deg °Modified thomas test (leg off side of bed): Right 82 deg; Left 90 deg °  °PALPATION: °Triggerpoint/ Tightness noted around piriformis bilat °  °LUMBARAROM/PROM °  °A/PROM A/PROM  °11/03/2021  °Flexion Full and pain free  °Extension 75% of full ext w/ concordant pain  °Right lateral flexion 50% of full ROM   °Left lateral flexion 50% of full ROM   °Right rotation 50% of full ROM with concordant pain  °Left rotation 25% of full ROM   ° (Blank rows = not tested) °  °  °LE MMT: °  °MMT Right °11/03/2021 Left °11/03/2021  °Hip flexion 4- 4-  °Hip extension 4- 4-  °Hip abduction 4- 4-  °Hip adduction  3+ 3+  °Hip internal rotation 4+ 4+  °Hip external rotation 4+ 4+  ° (Blank rows = not tested) °  °LUMBAR SPECIAL TESTS:  °Slump test: Negative for radicular sx, positive for hamstring tightness, Quadrant test: Negative, and Thomas test: Positive °  ° °Today's Treatment ° ° 11/14/21  °Therapeutic Exercise: ° Lt lateral shift correction 10 x 10 sec hold ° Standing lumbar extension 10 x 10 sec hold ° Bridges with ball squeeze 2 x 10 °Lumbar rotation bilat x 10 °Single limb clamshell with L4 band 2x10 bil °Standing calf raises x 20 reps ° ° °11/09/2021  °Therapeutic Exercise: ° Aerobic: Nustep Lvl 4 Seat 6 x 8 minutes °Supine:Bridges with ball squeeze 2 x 10; lumbar rotation bilat x 10;  °Prone: ° Seated: Slump stretch x 8 bilat; ° Standing: Gastroc stretch 3 x 30 sec; Hip abduction and extension bilat 2 x 10 2.5#; lumbar ext x 10 °Neuromuscular Re-education: °Manual Therapy: Percussive device to bilat hip abductors, glutes, and IT band °Therapeutic Activity: °Self Care: °Trigger Point Dry Needling:  °Modalities:   ° ° °TREATMENT 11/03/2021 °Reviewed pt's HEP ° ° °  °  °PATIENT EDUCATION:  °Education details: HEP °Person educated: Patient °Education method: Explanation, Demonstration, Verbal cues, and Handouts °Education comprehension: verbalized understanding, verbal cues required, and needs further education °  °  °HOME EXERCISE PROGRAM: °Access Code: [email or web address] °URL: [email or web address] °Date: 11/03/2021 °Prepared by: Brian Nelson °  °Exercises °Supine Hamstring Stretch with Strap -   2 x daily - 6 x weekly - 1 sets - 3 reps - 30 hold Supine Quadriceps Stretch with Strap on Table - 2 x daily - 6 x weekly - 2-3 reps - 30 hold Supine Figure 4 Piriformis Stretch - 2 x daily - 6 x weekly - 1 sets - 3 reps - 30 hold Supine Bridge - 2 x daily - 6 x weekly - 1-2 sets - 10 reps - 5 hold lumbar-thoracic extension mobilization with movement using towel - 2 x daily - 6 x weekly - 1-2 sets - 10 reps - 5  hold Standing Hip Abduction with Resistance at Ankles and Counter Support - 2 x daily - 6 x weekly - 3 sets - 10 reps Standing Hip Extension with Resistance at Ankles and Counter Support - 2 x daily - 6 x weekly - 3 sets - 10 reps     ASSESSMENT:   CLINICAL IMPRESSION: Pt tolerated session well today, and added lateral shift to see if this helps with radicular symptoms.  Will continue to benefit from PT to maximize function.    OBJECTIVE IMPAIRMENTS: decreased activity tolerance, difficulty walking, decreased balance, decreased endurance, decreased mobility, decreased ROM, decreased strength, impaired flexibility, impaired LE use,  and pain.   ACTIVITY LIMITATIONS: bending, lifting, carry, locomotion, cleaning, community activity, and occupation   PERSONAL FACTORS: Anxiety, Breast cancer, left ([place]), Carotid artery stenosis, CHF (congestive heart failure) ([place]), Chronic stable angina ([place]), Coronary artery disease, Depression, History of CVA with residual deficit,  Peripheral vascular disease ([place]), Stroke (HCC)are also affecting patient's functional outcome.     REHAB POTENTIAL: Good   CLINICAL DECISION MAKING: stable/uncomplicated   EVALUATION COMPLEXITY: Low       GOALS: Short term PT Goals (target date for Short term goals are 4 weeks 12/01/21) Pt will be I and compliant with HEP. Baseline:  Goal status: New, ongoing, met Pt will decrease pain by 25% overall Baseline: Goal status: New   Long term PT goals (target dates for all long term goals are 6 weeks 12/15/21) Pt will improve lumbar ROM to [facility] to improve functional mobility Baseline: Goal status: New Pt will improve  hip strength to at least 4+/5 MMT to improve functional strength Baseline: Goal status: New Pt will improve FOTO to at least 68% functional to show improved function Baseline: Goal status: New Pt will reduce pain to overall less than 2-3/10 with usual activity and work activity. Baseline: Goal status:  New Pt will be able to return to walking previous mileage WNL gait pattern without complaints Baseline: Goal status: New   PLAN: PT FREQUENCY: 1-2 times per week    PT DURATION: 6 weeks   PLANNED INTERVENTIONS (unless contraindicated): aquatic PT, cryotherapy, Electrical stimulation, Iontophoresis with 4 mg/ml dexamethasome, Moist heat, traction, Ultrasound, gait training, Therapeutic exercise, balance training, neuromuscular re-education, patient/family education, manual techniques, passive ROM, dry needling, taping, vasopnuematic device, spinal manipulations, joint manipulations   PLAN FOR NEXT SESSION: assess response to extension based exercises, hip strengthening    Laureen Abrahams, PT, DPT 11/14/21 10:59 AM

## 2021-11-15 ENCOUNTER — Encounter: Payer: Medicare Other | Admitting: Physical Therapy

## 2021-11-22 ENCOUNTER — Encounter: Payer: Self-pay | Admitting: Physical Therapy

## 2021-11-22 ENCOUNTER — Ambulatory Visit (INDEPENDENT_AMBULATORY_CARE_PROVIDER_SITE_OTHER): Payer: Medicare Other | Admitting: Physical Therapy

## 2021-11-22 ENCOUNTER — Other Ambulatory Visit: Payer: Self-pay

## 2021-11-22 DIAGNOSIS — M5441 Lumbago with sciatica, right side: Secondary | ICD-10-CM | POA: Diagnosis not present

## 2021-11-22 DIAGNOSIS — M6281 Muscle weakness (generalized): Secondary | ICD-10-CM | POA: Diagnosis not present

## 2021-11-22 NOTE — Therapy (Signed)
?OUTPATIENT PHYSICAL THERAPY TREATMENT NOTE ?DISCHARGE SUMMARY ? ? ?Patient Name: Brenda [name] ?MRN: [id number] ?DOB:05-31-1956, 66 y.o., female ?Today's Date: 11/22/2021 ? ?PCP: [name], MD ?REFERRING PROVIDER: Gregor Hams, MD ? ? PT End of Session - 11/22/21 0932   ? ? Visit Number 4   ? Number of Visits 12   ? Date for PT Re-Evaluation 12/15/21   ? PT Start Time [phone]   ? PT Stop Time 1001   ? PT Time Calculation (min) 33 min   ? Activity Tolerance Patient tolerated treatment well   ? Behavior During Therapy [facility] for tasks assessed/performed   ? ?  ?  ? ?  ? ? ? ? ?Past Medical History:  ?Diagnosis Date  ? Alcoholism ([place])   ? Quit 1998  ? Allergy   ? Anemia   ? Anxiety   ? Breast cancer, left [facility]) oncologist-- dr Jana Hakim  ? 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 Carotid artery stenosis   ? bilateral---  04-27-2000  s/p  left carotid endarterectomy:  last doppler in epic 08-13-2018  right ICA 40-59%,  right ECA >50%,  left ICA 1-39% ,  bilateral subclavians stenotic  ? CHF (congestive heart failure) ([place])   ? Chronic stable angina (HCC)   ? followed by cardiology  ? Complication of anesthesia   ? PONV  ? Coronary artery disease cardiologist-- dr Stanford Breed  ? 06-12-2000  s/p  CABG x5  :  cardiac cath 02-21-2018 --  normal LVF, 80% LM, occluded RCA, 95% Ramus and 100% OM,  Patent seqSVG to  PLA-PDA & LIMA to LAD,  seqSVG to OM and RI occluded  ? Depression   ? Family history of lung cancer   ? History of blood transfusion   ? History of CVA with residual deficit   ? short term memory loss per pt  ? History of Helicobacter pylori infection 2001  ? Hyperlipidemia   ? Hypertension   ? Hyperthyroidism   ? dx yrs ago, per pt have taken medication for awhile  ? Mild mitral regurgitation   ? per echo 06/ 2019  ? Peripheral vascular  disease ([place])   ? managed by vascular-- dr Donzetta Matters  ? PONV (postoperative nausea and vomiting)   ? S/P CABG x 5 06-12-2000   by dr Lucianne Lei tright _0   ? Severe obstructive sleep apnea-hypopnea syndrome 02/25/2021  ? Stroke [facility]) 2002  ? mild memory loss  ? ?Past Surgical History:  ?Procedure Laterality Date  ? ABDOMINAL AORTOGRAM W/LOWER EXTREMITY N/A 06/13/2017  ? Procedure: ABDOMINAL AORTOGRAM W/LOWER EXTREMITY;  Surgeon: Waynetta Sandy, MD;  Location: [place] [facility];  Service: Cardiovascular;  Laterality: N/A;  Bilateral  ? ABDOMINAL HYSTERECTOMY  1998  ? BREAST LUMPECTOMY WITH AXILLARY LYMPH NODE DISSECTION Left 1999  ? CARDIAC CATHETERIZATION  04-24-2000  dr Caryl Comes  ? 3V CAD , subtotal PDA w/ slow flow colleterals , ef 48%  ? CARDIAC CATHETERIZATION  06/11/2000  ? 3V CAD with progression of LAD disease  ? CAROTID ENDARTERECTOMY Left 04-27-2000   dr Amedeo Plenty  _1   ? COLONOSCOPY  06/24/2019  ? Danis  ? CORONARY ARTERY BYPASS GRAFT  06-12-2000   dr Lucianne Lei tright _2   ? x 5 (left internal mammary artery to left anterior  descending  coronary artery  ? LEFT HEART CATH AND CORS/GRAFTS ANGIOGRAPHY N/A 02/21/2018  ? Procedure: LEFT HEART CATH AND CORS/GRAFTS ANGIOGRAPHY;  Surgeon: Lorretta Harp, MD;  Location: [place] [facility];  Service: Cardiovascular;  Laterality: N/A;  ? PERIPHERAL VASCULAR INTERVENTION  06/13/2017  ? Procedure: PERIPHERAL VASCULAR INTERVENTION;  Surgeon: Waynetta Sandy, MD;  Location: [place] [facility];  Service: Cardiovascular;;  Bilateral Iliac Stents  ? ROBOTIC ASSISTED SALPINGO OOPHERECTOMY Bilateral 03/13/2019  ? Procedure: XI ROBOTIC ASSISTED BILATERAL  SALPINGO OOPHORECTOMY;  Surgeon: Everitt Amber, MD;  Location: [facility];  Service: Gynecology;  Laterality: Bilateral;  ? TOTAL MASTECTOMY Bilateral 11/07/2018  ? Procedure: BILATERAL MASTECTOMIES;  Surgeon: Stark Klein, MD;  Location: [place];  Service: General;  Laterality: Bilateral;   ? ?Patient Active Problem List  ? Diagnosis Date Noted  ? Vision loss 04/08/2021  ? Optic neuritis, retrobulbar (acute), left 03/29/2021  ? Severe obstructive sleep apnea-hypopnea syndrome 02/25/2021  ? PAD (peripheral artery disease) ([place]) 01/31/2021  ? Memory deficit following unspecified cerebrovascular disease 01/31/2021  ? Snoring 01/31/2021  ? Excessive daytime sleepiness 01/31/2021  ? Cognitive deficit with impaired visuospatial function 01/31/2021  ? Adjustment disorder 12/20/2019  ? Pancreatic insufficiency 05/30/2019  ? Encounter for general adult medical examination with abnormal findings 05/30/2019  ? Monoallelic mutation of PALB2 gene 10/25/2018  ? Genetic testing 10/25/2018  ? Malignant neoplasm of central portion of left breast in female, estrogen receptor negative ([place]) 10/08/2018  ? 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/08/2018  ? Loss of transverse plantar arch of left foot 07/19/2018  ? Hypothyroidism 03/02/2018  ? Lesion of pancreas 06/22/2017  ? Constipation 06/22/2017  ? Peripheral vascular disease ([place]) 06/01/2017  ? Left leg pain 05/10/2017  ? Allergic rhinitis 02/25/2016  ? Low back pain 09/29/2015  ? Carpal tunnel syndrome, right 08/20/2015  ? Hx of completed stroke 05/21/2015  ? Memory impairment 05/21/2015  ? Hyperthyroidism 12/29/2008  ? HYPERCHOLESTEROLEMIA 12/29/2008  ? Anxiety 12/29/2008  ? Essential hypertension 12/29/2008  ? Coronary atherosclerosis 12/29/2008  ? CAROTID ARTERY STENOSIS 12/29/2008  ? ? ?PCP: [name], MD ?  ?REFERRING PROVIDER: Gregor Hams, MD ?  ?REFERRING DIAG: M54.50,G89.29 (ICD-10-CM) - Chronic bilateral low back pain without sciatica ?  ?THERAPY DIAG:  ?Acute bilateral low back pain with right-sided sciatica ?  ?Muscle weakness (generalized) ?  ?ONSET DATE: 10/21/2021 ?  ?SUBJECTIVE:                                                                                                                                                                                           ?  ?  SUBJECTIVE STATEMENT: has a knot on her Lt buttock, not sure what it is.  Does feel like symptoms are improving overall.   ?  ?PERTINENT HISTORY:  ?Anxiety, Breast cancer, left ([place]), Carotid artery stenosis, CHF (congestive heart failure) (HCC), Chronic stable angina ([place]), Coronary artery disease, Depression, History of CVA with residual deficit,  Peripheral vascular disease ([place]), Stroke ([place]) ?  ?PAIN:  ?No Pain ?  ?PRECAUTIONS: None ?  ?WEIGHT BEARING RESTRICTIONS No ?  ?FALLS:  ?Has patient fallen in last 6 months? No, Number of falls: 0 ?  ?LIVING ENVIRONMENT: ?Stairs: No ?  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  ?PLOF: Independent ?  ?PATIENT GOALS : Get back to regular routine, Working out (lifting weights), stretching  ?  ?  ?OBJECTIVE:  ?  ?DIAGNOSTIC FINDINGS:  ?X-ray of lumbar spine: No acute findings. 2. Mild degenerative disc disease and facet arthropathy ?  ?PATIENT SURVEYS:  ?11/03/21 FOTO 53% ?11/22/21 FOTO 69% ?  ?MUSCLE LENGTH: ?Hamstrings: Right 85 deg; Left 85 deg ?Modified thomas test (leg off side of bed): Right 82 deg; Left 90 deg ?  ?PALPATION: ?Triggerpoint/ Tightness noted around piriformis bilat ?  ?LUMBARAROM/PROM ?  ?A/PROM A/PROM  ?11/03/2021 AROM ?11/22/21  ?Flexion Full and pain free WNL  ?Extension 75% of full ext w/ concordant pain WNL  ?Right lateral flexion 50% of full ROM  WNL  ?Left lateral flexion 50% of full ROM  WNL  ?Right rotation 50% of full ROM with concordant pain WNL  ?Left rotation 25% of full ROM  WNL  ? (Blank rows = not tested) ?  ?  ?LE MMT: ?  ?MMT Right ?11/03/2021 Left ?11/03/2021 Right ?11/22/21 Left ?11/22/21  ?Hip flexion 4- 4- 4+ 4+  ?Hip extension 4- 4- 4- 4-  ?Hip abduction 4- 4- 4+ 4+  ?Hip adduction 3+ 3+ 4+ 4+  ?Hip internal rotation 4+ 4+ 4+ 4+  ?Hip external rotation 4+ 4+ 4+ 4+  ? (Blank rows = not tested) ?  ?LUMBAR SPECIAL TESTS:  ?Slump test: Negative for radicular  sx, positive for hamstring tightness, Quadrant test: Negative, and Thomas test: Positive ?  ? ?Today's Treatment ?11/22/21 ?Therapeutic Exercise: ? NuStep L5 x 8 min ? MMT and ROM testing ? Discussed use of tenni

## 2021-11-30 ENCOUNTER — Encounter: Payer: Medicare Other | Admitting: Physical Therapy
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
## 2021-12-07 ENCOUNTER — Ambulatory Visit: Payer: Medicare Other | Admitting: Family Medicine

## 2021-12-07 ENCOUNTER — Encounter: Payer: Medicare Other | Admitting: Physical Therapy

## 2021-12-08 ENCOUNTER — Ambulatory Visit (INDEPENDENT_AMBULATORY_CARE_PROVIDER_SITE_OTHER): Payer: Medicare Other | Admitting: Internal Medicine

## 2021-12-08 ENCOUNTER — Encounter: Payer: Self-pay | Admitting: Internal Medicine

## 2021-12-08 DIAGNOSIS — R21 Rash and other nonspecific skin eruption: Secondary | ICD-10-CM

## 2021-12-08 DIAGNOSIS — M79642 Pain in left hand: Secondary | ICD-10-CM

## 2021-12-08 MED ORDER — TRIAMCINOLONE ACETONIDE 0.1 % EX CREA: 1.0000 "application " | TOPICAL_CREAM | Freq: Two times a day (BID) | CUTANEOUS | 0 refills | Status: DC

## 2021-12-08 MED ORDER — MELOXICAM 15 MG PO TABS: 15.0000 mg | ORAL_TABLET | Freq: Every day | ORAL | 0 refills | Status: DC

## 2021-12-08 NOTE — Progress Notes (Signed)
? ?  Subjective:  ? ?Patient ID: Brenda [name], female    DOB: 23-Aug-1956, 66 y.o.   MRN: [id number] ? ?HPI ?The patient is a 66 YO female coming in for concern about left buttock and finger pain. ? ?Review of Systems  ?Constitutional: Negative.   ?HENT: Negative.    ?Eyes: Negative.   ?Respiratory:  Negative for cough, chest tightness and shortness of breath.   ?Cardiovascular:  Negative for chest pain, palpitations and leg swelling.  ?Gastrointestinal:  Negative for abdominal distention, abdominal pain, constipation, diarrhea, nausea and vomiting.  ?Musculoskeletal:  Positive for arthralgias.  ?Skin:  Positive for rash.  ?Neurological: Negative.   ?Psychiatric/Behavioral: Negative.    ? ?Objective:  ?Physical Exam ?Constitutional:   ?   Appearance: She is well-developed.  ?HENT:  ?   Head: Normocephalic and atraumatic.  ?Cardiovascular:  ?   Rate and Rhythm: Normal rate and regular rhythm.  ?Pulmonary:  ?   Effort: Pulmonary effort is normal. No respiratory distress.  ?   Breath sounds: Normal breath sounds. No wheezing or rales.  ?Abdominal:  ?   General: Bowel sounds are normal. There is no distension.  ?   Palpations: Abdomen is soft.  ?   Tenderness: There is no abdominal tenderness. There is no rebound.  ?Musculoskeletal:     ?   General: Tenderness present.  ?   Cervical back: Normal range of motion.  ?   Comments: Left 5th finger pain, normal movement and ROM  ?Skin: ?   General: Skin is warm and dry.  ?Neurological:  ?   Mental Status: She is alert and oriented to person, place, and time.  ?   Coordination: Coordination normal.  ? ? ?Vitals:  ? 12/08/21 0853  ?BP: 122/78  ?Pulse: (!) 53  ?Resp: 18  ?SpO2: 98%  ?Weight: 151 lb 12.8 oz (68.9 kg)  ?Height: '5\' 3"'$  (1.6 m)  ? ? ?This visit occurred during the SARS-CoV-2 public health emergency.  Safety protocols were in place, including screening questions prior to the visit, additional usage of staff PPE, and extensive cleaning of exam room while observing  appropriate contact time as indicated for disinfecting solutions.  ? ?Assessment & Plan:  ? ?

## 2021-12-08 NOTE — Patient Instructions (Signed)
We have sent in a cream to use on the buttock twice a day until this area is gone. ? ?We have sent in an antiinflammatory meloxicam to take 1 pill daily for the next 1-2 weeks to see if this helps the hand. If worsening or not improving let us know. ?

## 2021-12-09 DIAGNOSIS — R21 Rash and other nonspecific skin eruption: Secondary | ICD-10-CM | POA: Insufficient documentation

## 2021-12-09 DIAGNOSIS — M79642 Pain in left hand: Secondary | ICD-10-CM | POA: Insufficient documentation

## 2021-12-09 NOTE — Assessment & Plan Note (Signed)
Rash on left buttock which is creating thickened skin and feels like cyst to her. Rx triamcinolone. ?

## 2021-12-09 NOTE — Assessment & Plan Note (Signed)
Rx meloxicam for pain relief and inflammation. If no improvement in 1-2 weeks can check x-ray hand. Injury was about 1 month ago so we talked about if small fracture this is likely already healed or mostly healed.  ?

## 2021-12-26 ENCOUNTER — Encounter: Payer: Self-pay | Admitting: Internal Medicine

## 2021-12-26 DIAGNOSIS — M79644 Pain in right finger(s): Secondary | ICD-10-CM

## 2021-12-27 ENCOUNTER — Ambulatory Visit (INDEPENDENT_AMBULATORY_CARE_PROVIDER_SITE_OTHER): Payer: Medicare Other

## 2021-12-27 DIAGNOSIS — M79644 Pain in right finger(s): Secondary | ICD-10-CM

## 2021-12-28 ENCOUNTER — Encounter: Payer: Self-pay | Admitting: Internal Medicine

## 2021-12-29 ENCOUNTER — Telehealth: Payer: Self-pay | Admitting: *Deleted

## 2021-12-29 NOTE — Chronic Care Management (AMB) (Signed)
?  Care Management  ? ?Note ? ?12/29/2021 ?Name: Brenda [name] MRN: [id number] DOB: May 06, 1956 ? ?[name] is a 66 y.o. year old female who is a primary care patient of [name], MD and is actively engaged with the care management team. I reached out to [name] by phone today to assist with re-scheduling a follow up visit with the Pharmacist ? ?Follow up plan: ?Unsuccessful telephone outreach attempt made. A HIPAA compliant phone message was left for the patient providing contact information and requesting a return call.  ?The care management team will reach out to the patient again over the next 7 days.  ?If patient returns call to provider office, please advise to call [place]  at [phone]. ? ?Laverda Sorenson  ?Care Guide, Embedded Care Coordination ?[place]  Care Management  ?Direct Dial: [phone] ? ?

## 2022-01-03 ENCOUNTER — Telehealth: Payer: Medicare Other

## 2022-01-05 NOTE — Chronic Care Management (AMB) (Signed)
?  Care Management  ? ?Note ? ?01/05/2022 ?Name: Brenda [name] MRN: [id number] DOB: 05-18-1956 ? ?Brenda [name] is a 66 y.o. year old female who is a primary care patient of [name], MD and is actively engaged with the care management team. I reached out to [name] by phone today to assist with re-scheduling a follow up visit with the Pharmacist ? ?Follow up plan: ?Telephone appointment with care management team member scheduled for:02/23/22 ? ?Laverda Sorenson  ?Care Guide, Embedded Care Coordination ?[place]  Care Management  ?Direct Dial: [phone] ? ?

## 2022-02-22 ENCOUNTER — Telehealth: Payer: Self-pay | Admitting: Hematology and Oncology

## 2022-02-22 NOTE — Telephone Encounter (Signed)
Rescheduled appointment per providers. Patient aware.  ? ?

## 2022-02-23 ENCOUNTER — Telehealth: Payer: Medicare Other

## 2022-04-13 NOTE — Progress Notes (Signed)
I, Peterson Lombard, LAT, ATC acting as a scribe for Lynne Leader, MD.  Brenda [name] is a 66 y.o. female who presents to [place] at [facility] today for L hip pain. Pt was last seen by Dr. Georgina Snell on 10/25/21 for chronic bilat LBP and completed for PT visits, the last of which was on 11/22/21. Today, pt reports L hip pain ongoing for about 2 weeks. Pt reports having stents put in her groin. Pt is on her feet walking a lot for work at the [employer]. Pt locates pain to L buttock w/ radiating pain along the posterior aspect of her L thigh and tingling in her L foot.   She notes the pain is worse with prolonged standing and better with rest.  She notes that exercise does not seem to worsen her pain nor improvement.  She does have a pertinent medical history for peripheral arterial disease with claudication requiring iliac stents.  She is very concerned that her current symptoms are due to recurrent worsening of her peripheral arterial disease and is interested in a repeat assessment of her vascular function if possible.  Dx imaging: 10/22/21 L-spine XR  04/12/20 L-spine MRI  Pertinent review of systems: No fevers or chills  Relevant historical information: History of peripheral arterial disease.   Exam:  BP 132/76   Pulse (!) 56   Ht '5\' 3"'$  (1.6 m)   Wt 151 lb (68.5 kg)   LMP  (LMP Unknown)   SpO2 98%   BMI 26.75 kg/m  General: Well Developed, well nourished, and in no acute distress.   MSK: L-spine: Nontender midline. Normal lumbar motion. Lower extremity strength is intact. Reflexes are intact. Negative slump test. Pulses are intact distally.  Capillary refill is intact distally.    Lab and Radiology Results  EXAM: LUMBAR SPINE - COMPLETE 4+ VIEW   COMPARISON:  04/12/2020   FINDINGS: The alignment appears normal. The vertebral body heights are well preserved. Mild disc space narrowing and endplate spurring is identified throughout the lumbar spine.  Bilateral facet arthropathy is noted at L4-5 and L5-S1. Extensive aortic atherosclerotic calcifications identified. Stents are noted within the proximal iliac arteries.   IMPRESSION: 1. No acute findings. 2. Mild degenerative disc disease and facet arthropathy.     Electronically Signed   By: Kerby Moors M.D.   On: 10/22/2021 15:02 I, Lynne Leader, personally (independently) visualized and performed the interpretation of the images attached in this note.     Assessment and Plan: 66 y.o. female with left leg pain thought to be primarily due to lumbar radiculopathy.  Ideally would like to treat with prednisone and gabapentin.  She like to hold off on prednisone now.  We will go ahead and try gabapentin.  Ann very concerned about recurrent PAD and is interested in repeat ABI to assess her arterial function which is reasonable.  We will go ahead and obtain an ABI.  Assuming ABIs normal we will proceed with prednisone and if that does not help with lumbar spine MRI.  At this point this is a chronic recurrent issue that has failed trial of physical therapy earlier this year and would benefit ultimately from an epidural steroid injection.   PDMP not reviewed this encounter. No orders of the defined types were placed in this encounter.  Meds ordered this encounter  Medications   gabapentin (NEURONTIN) 300 MG capsule    Sig: Take 1 capsule (300 mg total) by mouth 3 (three) times daily  as needed (nerve pain).    Dispense:  90 capsule    Refill:  3     Discussed warning signs or symptoms. Please see discharge instructions. Patient expresses understanding.   The above documentation has been reviewed and is accurate and complete Lynne Leader, M.D.

## 2022-04-14 ENCOUNTER — Ambulatory Visit (INDEPENDENT_AMBULATORY_CARE_PROVIDER_SITE_OTHER): Payer: Medicare Other | Admitting: Family Medicine

## 2022-04-14 VITALS — BP 132/76 | HR 56 | Ht 63.0 in | Wt 151.0 lb

## 2022-04-14 DIAGNOSIS — R202 Paresthesia of skin: Secondary | ICD-10-CM

## 2022-04-14 DIAGNOSIS — I739 Peripheral vascular disease, unspecified: Secondary | ICD-10-CM

## 2022-04-14 DIAGNOSIS — M5416 Radiculopathy, lumbar region: Secondary | ICD-10-CM | POA: Diagnosis not present

## 2022-04-14 MED ORDER — GABAPENTIN 300 MG PO CAPS: 300.0000 mg | ORAL_CAPSULE | Freq: Three times a day (TID) | ORAL | 3 refills | Status: AC | PRN

## 2022-04-14 NOTE — Patient Instructions (Addendum)
Thank you for coming in today.   I sent a refill of your Gabapentin to your pharmacy.  Proceed to vascular ultrasound [facility] [address], [place],  [address] Phone: [phone] Aug 10th, 10AM

## 2022-04-20 ENCOUNTER — Ambulatory Visit (HOSPITAL_COMMUNITY): Payer: Medicare Other

## 2022-04-20 ENCOUNTER — Encounter: Payer: Self-pay | Admitting: Hematology and Oncology

## 2022-04-20 ENCOUNTER — Inpatient Hospital Stay: Payer: Medicare Other | Attending: Hematology and Oncology | Admitting: Hematology and Oncology

## 2022-04-20 ENCOUNTER — Other Ambulatory Visit: Payer: Self-pay

## 2022-04-20 VITALS — BP 144/68 | HR 64 | Temp 98.1°F | Wt 152.3 lb

## 2022-04-20 DIAGNOSIS — Z801 Family history of malignant neoplasm of trachea, bronchus and lung: Secondary | ICD-10-CM | POA: Insufficient documentation

## 2022-04-20 DIAGNOSIS — Z9013 Acquired absence of bilateral breasts and nipples: Secondary | ICD-10-CM | POA: Insufficient documentation

## 2022-04-20 DIAGNOSIS — N6320 Unspecified lump in the left breast, unspecified quadrant: Secondary | ICD-10-CM | POA: Insufficient documentation

## 2022-04-20 DIAGNOSIS — Z923 Personal history of irradiation: Secondary | ICD-10-CM | POA: Insufficient documentation

## 2022-04-20 DIAGNOSIS — D0512 Intraductal carcinoma in situ of left breast: Secondary | ICD-10-CM

## 2022-04-20 DIAGNOSIS — Z87891 Personal history of nicotine dependence: Secondary | ICD-10-CM | POA: Insufficient documentation

## 2022-04-20 DIAGNOSIS — Z86000 Personal history of in-situ neoplasm of breast: Secondary | ICD-10-CM | POA: Insufficient documentation

## 2022-04-20 DIAGNOSIS — Z1509 Genetic susceptibility to other malignant neoplasm: Secondary | ICD-10-CM | POA: Diagnosis not present

## 2022-04-20 DIAGNOSIS — Z1501 Genetic susceptibility to malignant neoplasm of breast: Secondary | ICD-10-CM | POA: Insufficient documentation

## 2022-04-20 NOTE — Progress Notes (Signed)
[place]  [phone] [phone]    ID: Brenda [name] DOB: 10/30/1955  MR#: [id number]  GEZ#:[id number]  Patient Care Team: Brenda Koch, MD as PCP - General (Internal Medicine) Brenda [name] Brenda Bors, MD as PCP - Cardiology (Cardiology) Brenda Bowen, MD as Consulting Physician (Endocrinology) Brenda Klein, MD as Consulting Physician (General Surgery) Brenda [name], Brenda Dad, MD (Inactive) as Consulting Physician (Oncology) Brenda Limbo, MD as Consulting Physician (Plastic Surgery) Brenda Craver, MD as Consulting Physician (Gastroenterology) Brenda Amber, MD as Consulting Physician (Gynecologic Oncology) Brenda [name], [name] as Pharmacist (Pharmacist) Brenda [name], Brenda Partridge, MD as Consulting Physician (Neurology) OTHER MD:   CHIEF COMPLAINT: PALB2 POSITIVE ductal carcinoma in situ, estrogen receptor positive (s/p bilateral mastectomies)  CURRENT TREATMENT: Observation  INTERVAL HISTORY:  Brenda [name] was seen today for follow-up of her 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 She continues under observation.  She was seen at 1:42 PM ( her appointment was at 1:45), she was upset and wondered if we forgot about her.  She is very unsatisfied with her bilateral mastectomy cosmetic outcome and was very tearful about that.  She was hoping to see a Museum/gallery conservator. She then was tearful about her daughter since she is also a carrier but does not like to do mammograms because of the fear of finding something.. She is a otherwise active, walks and exercises regularly. She wonders if her radiation caused her some cardiovascular disease or atherosclerosis.  She is currently asymptomatic from his cardiovascular disease. She otherwise denies any change in breathing, bowel or urinary habits.  She has noticed a single episode of pain last week in the left chest wall radiating to her shoulder. Rest of the pertinent 10 point ROS reviewed and  negative   COVID 19 VACCINATION STATUS: Pfizer x4, last 02/2021   HISTORY OF CURRENT ILLNESS: From the original intake note:  Brenda [name] has a remote history of breast cancer, and she underwent a lumpectomy of the left breast in 1999.  She does not remember the size of the tumor but knows that 8 lymph nodes were removed and that they were all clear.  She was treated with chemotherapy and radiation.  She does not recall other details.  She did not receive antiestrogens  More recently, on 08/12/2018 Brenda [name] had routine screening mammography showing a possible abnormality in the left breast. She underwent unilateral left diagnostic mammography with tomography at [facility] on 08/15/2018 showing: Breast Density Category B. Spot compression magnification views were performed over the upper outer far posterior left breast. There are grouped microcalcifications in the region of the lumpectomy site in the far upper outer posterior left breast varying in shape, size, and density, spanning approximately 2.7 cm.   Accordingly on 08/19/2018 she proceeded to biopsy of the left breast area in question. The pathology from this procedure showed ([id number]): ductal carcinoma in situ, intermediate nuclear grade with calcifications.  Prognostic indicators significant for: estrogen receptor, 100% positive and progesterone receptor, 90% positive, both with strong staining intensity.   The patient's subsequent history is as detailed above.   PAST MEDICAL HISTORY: Past Medical History:  Diagnosis Date   Alcoholism [facility])    Quit 1998   Allergy    Anemia    Anxiety    Breast cancer, left [facility]) oncologist-- dr Brenda [name]   dx 1999,  DCIS;  s/p  left lumpectomy w/ node dissection's;  completed chemo and radiation 2000:  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, Grade 2, Stage  0 (ER/PR +);  11-07-2018  s/p  bilateral mastectomies   Carotid artery stenosis    bilateral---  04-27-2000  s/p  left carotid  endarterectomy:  last doppler in epic 08-13-2018  right ICA 40-59%,  right ECA >50%,  left ICA 1-39% ,  bilateral subclavians stenotic   CHF (congestive heart failure) ([place])    Chronic stable angina ([place])    followed by cardiology   Complication of anesthesia    PONV   Coronary artery disease cardiologist-- dr Brenda [name]   06-12-2000  s/p  CABG x5  :  cardiac cath 02-21-2018 --  normal LVF, 80% LM, occluded RCA, 95% Ramus and 100% OM,  Patent seqSVG to  PLA-PDA & LIMA to LAD,  seqSVG to OM and RI occluded   Depression    Family history of lung cancer    History of blood transfusion    History of CVA with residual deficit    short term memory loss per pt   History of Helicobacter pylori infection 2001   Hyperlipidemia    Hypertension    Hyperthyroidism    dx yrs ago, per pt have taken medication for awhile   Mild mitral regurgitation    per echo 06/ 2019   Peripheral vascular disease ([place])    managed by vascular-- dr Donzetta Matters   PONV (postoperative nausea and vomiting)    S/P CABG x 5 06-12-2000   by dr Lucianne Lei tright _0    Severe obstructive sleep apnea-hypopnea syndrome 02/25/2021   Stroke ([place]) 2002   mild memory loss    PAST SURGICAL HISTORY: Past Surgical History:  Procedure Laterality Date   ABDOMINAL AORTOGRAM W/LOWER EXTREMITY N/A 06/13/2017   Procedure: ABDOMINAL AORTOGRAM W/LOWER EXTREMITY;  Surgeon: Waynetta Sandy, MD;  Location: [place] [facility];  Service: Cardiovascular;  Laterality: N/A;  Bilateral   ABDOMINAL HYSTERECTOMY  1998   BREAST LUMPECTOMY WITH AXILLARY LYMPH NODE DISSECTION Left 1999   CARDIAC CATHETERIZATION  04-24-2000  dr Caryl Comes   3V CAD , subtotal PDA w/ slow flow colleterals , ef 48%   CARDIAC CATHETERIZATION  06/11/2000   3V CAD with progression of LAD disease   CAROTID ENDARTERECTOMY Left 04-27-2000   dr Amedeo Plenty  _1    COLONOSCOPY  06/24/2019   [facility]   CORONARY ARTERY BYPASS GRAFT  06-12-2000   dr Lucianne Lei tright _2    x 5 (left internal mammary  artery to left anterior  descending coronary artery   LEFT HEART CATH AND CORS/GRAFTS ANGIOGRAPHY N/A 02/21/2018   Procedure: LEFT HEART CATH AND CORS/GRAFTS ANGIOGRAPHY;  Surgeon: Lorretta Harp, MD;  Location: [place] [facility];  Service: Cardiovascular;  Laterality: N/A;   PERIPHERAL VASCULAR INTERVENTION  06/13/2017   Procedure: PERIPHERAL VASCULAR INTERVENTION;  Surgeon: Waynetta Sandy, MD;  Location: [place] [facility];  Service: Cardiovascular;;  Bilateral Iliac Stents   ROBOTIC ASSISTED SALPINGO OOPHERECTOMY Bilateral 03/13/2019   Procedure: XI ROBOTIC ASSISTED BILATERAL  SALPINGO OOPHORECTOMY;  Surgeon: Brenda Amber, MD;  Location: [place];  Service: Gynecology;  Laterality: Bilateral;   TOTAL MASTECTOMY Bilateral 11/07/2018   Procedure: BILATERAL MASTECTOMIES;  Surgeon: Brenda Klein, MD;  Location: [place];  Service: General;  Laterality: Bilateral;    FAMILY HISTORY: Family History  Problem Relation Age of Onset   Lung cancer Mother 9       died at an early age   Other Other        There is no Hx of premature coronary artery  disease in her family   Colon cancer Neg Hx    Rectal cancer Neg Hx    Stomach cancer Neg Hx   Osie's has no information on her father.  The patient's mother died when she was 65 years old and the patient was put in foster care and partly raised by great aunts. The patient has 1 sister, but she does not know her sister's medical history.   GYNECOLOGIC HISTORY:  No LMP recorded (lmp unknown). Patient has had a hysterectomy. Menarche age 48, first live birth age 56, the patient is GX P1.  She had hysterectomy around age 50.  She did not receive hormone replacement.  She did not have bilateral salpingo-oophorectomy   SOCIAL HISTORY: (updated 10/25/2018) Raquel Sarna grew up in a foster home given that her mother died so young.  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  Daughter [name], age 58, works for the [employer] but currently is under [employer] because of an injury inflicted by a Ship broker.  The patient has 2 granddaughters, age 29 and 75 as of January 2019.  At home is just the patient. Lyn's sister lives outside of [place], [place]: Not in place   HEALTH MAINTENANCE: Social History   Tobacco Use   Smoking status: Former    Years: 17.00    Types: Cigarettes    Quit date: 09/11/2001    Years since quitting: 20.6   Smokeless tobacco: Never  Vaping Use   Vaping Use: Never used  Substance Use Topics   Alcohol use: Not Currently    Comment: Quit 1998   Drug use: No    Colonoscopy: January 2012  PAP: Status post hysterectomy  Bone density: May 2012 at [place], T score -0.5   Allergies  Allergen Reactions   Adhesive [Tape] Itching   Latex Rash    Current Outpatient Medications  Medication Sig Dispense Refill   amLODipine (NORVASC) 10 MG tablet Take 1 tablet (10 mg total) by mouth daily. 90 tablet 3   aspirin EC 81 MG tablet Take 1 tablet (81 mg total) by mouth daily. Swallow whole. 90 tablet 3   carboxymethylcellulose (REFRESH PLUS) 0.5 % SOLN Apply to eye.     FIBER ADULT GUMMIES PO Take by mouth.     gabapentin (NEURONTIN) 300 MG capsule Take 1 capsule (300 mg total) by mouth 3 (three) times daily as needed (nerve pain). 90 capsule 3   metoprolol succinate (TOPROL-XL) 100 MG 24 hr tablet Take with or immediately following a meal. 90 tablet 0   nitroGLYCERIN (NITROSTAT) 0.4 MG SL tablet Place 1 tablet (0.4 mg total) under the tongue every 5 (five) minutes as needed for chest pain. 1 tablet under tongue every 5 minutes as needed for chest discomfort (max 2 Tab/day) 25 tablet 4   Potassium Chloride ER 20 MEQ TBCR TAKE 1 TABLET BY MOUTH ON MONDAY,WEDNESDAYS,FRIDAYS AND SUNDAYS 45 tablet 3   rosuvastatin (CRESTOR) 40 MG tablet Take 1 tablet by  mouth once daily 90 tablet 3   triamcinolone cream (KENALOG) 0.1 % Apply 1 application. topically 2 (two) times daily. 100 g 0   Vitamin D, Ergocalciferol, 50000 units CAPS Take 1 capsule by mouth 4 (four) times a week.     No current facility-administered medications for this visit.     OBJECTIVE: African-American woman in no  acute distress  Vitals:   04/20/22 1240  BP: (!) 144/68  Pulse: 64  Temp: 98.1 F (36.7 C)  SpO2: 99%      Body mass index is 26.98 kg/m.   Wt Readings from Last 3 Encounters:  04/20/22 152 lb 4.8 oz (69.1 kg)  04/14/22 151 lb (68.5 kg)  12/08/21 151 lb 12.8 oz (68.9 kg)     ECOG FS:1 - Symptomatic but completely ambulatory  Sclerae unicteric, EOMs intact Wearing a mask General appearance: Alert, oriented and in no acute distress Chest: Clear to auscultation bilaterally Breasts: Bilateral breasts inspected, small nodular area in the left chest wall near the surgical scar likely fat necrosis but since she is new to me, I cannot quite comment on the chronicity of this.  LAB RESULTS:  CMP     Component Value Date/Time   NA 140 08/23/2021 1501   NA 141 01/31/2021 1532   K 3.7 08/23/2021 1501   CL 107 08/23/2021 1501   CO2 25 08/23/2021 1501   GLUCOSE 88 08/23/2021 1501   BUN 18 08/23/2021 1501   BUN 13 01/31/2021 1532   CREATININE 0.94 08/23/2021 1501   CREATININE 1.14 (H) 10/08/2018 1109   CREATININE 1.04 (H) 01/31/2017 0915   CALCIUM 9.2 08/23/2021 1501   PROT 7.9 08/23/2021 1501   PROT 7.2 07/11/2021 0822   ALBUMIN 4.0 08/23/2021 1501   ALBUMIN 4.5 07/11/2021 0822   AST 26 08/23/2021 1501   AST 22 10/08/2018 1109   ALT 19 08/23/2021 1501   ALT 22 10/08/2018 1109   ALKPHOS 82 08/23/2021 1501   BILITOT 0.9 08/23/2021 1501   BILITOT 0.8 07/11/2021 0822   BILITOT 0.9 10/08/2018 1109   GFRNONAA >60 08/23/2021 1501   GFRNONAA 51 (L) 10/08/2018 1109   GFRAA 68 09/04/2019 0827   GFRAA 59 (L) 10/08/2018 1109    Lab Results  Component  Value Date   WBC 7.2 08/23/2021   NEUTROABS 3.7 08/23/2021   HGB 13.6 08/23/2021   HCT 40.4 08/23/2021   MCV 95.1 08/23/2021   PLT 153 08/23/2021   No results found for: "LABCA2"  No components found for: "ENIDPO242"  No results for input(s): "INR" in the last 168 hours.  No results found for: "LABCA2"  No results found for: "PNT614"  No results found for: "CAN125"  No results found for: "CAN153"  No results found for: "CA2729"  No components found for: "HGQUANT"  No results found for: "CEA1", "CEA" / No results found for: "CEA1", "CEA"   No results found for: "AFPTUMOR"  No results found for: "CHROMOGRNA"  No results found for: "TOTALPROTELP", "ALBUMINELP", "A1GS", "A2GS", "BETS", "BETA2SER", "GAMS", "MSPIKE", "SPEI" (this displays SPEP labs)  No results found for: "KPAFRELGTCHN", "LAMBDASER", "KAPLAMBRATIO" (kappa/lambda light chains)  No results found for: "HGBA", "HGBA2QUANT", "HGBFQUANT", "HGBSQUAN" (Hemoglobinopathy evaluation)   Lab Results  Component Value Date   LDH 155 07/03/2019    Lab Results  Component Value Date   IRON 75 07/03/2019   IRONPCTSAT 24.0 07/03/2019   (Iron and TIBC)  Lab Results  Component Value Date   FERRITIN 128.8 07/03/2019    Urinalysis    Component Value Date/Time   COLORURINE YELLOW 08/05/2019 1530   APPEARANCEUR CLOUDY (A) 08/05/2019 1530   LABSPEC 1.008 08/05/2019 [place] 5.0 08/05/2019 [place] 08/05/2019 [place] 08/05/2019 [place] 08/05/2019 [place] 08/05/2019 [place] 08/05/2019 1530  NITRITE NEGATIVE 08/05/2019 1530   LEUKOCYTESUR SMALL (A) 08/05/2019 1530    STUDIES:  No results found.   ELIGIBLE FOR AVAILABLE RESEARCH PROTOCOL: no   ASSESSMENT: 66 y.o. PALB2 positive [place] woman status post left breast biopsy 08/19/2018 for ductal carcinoma in situ, grade 2, estrogen and progesterone receptor  positive  (1) left lumpectomy 1999 for an (estrogen receptor negative?) stage I-II invasive breast cancer  (a) s/p chemotherapy  (b) s/p radiation  (2) genetics testing 10/14/2018 through the Common Hereditary Cancer Panel + EGFR was POSITIVE for a pathogenic variant in PALB2 c.3113G>A (p.Trp1038*).  (a) no additional mutations were noted in APC, ATM, AXIN2, BARD1, BMPR1A, BRCA1, BRCA2, BRIP1, BUB1B, CDH1, CDK4, CDKN2A, CHEK2, CTNNA1, DICER1, ENG, EGFR, EPCAM, GALNT12, GREM1, HOXB13, KIT, MEN1, MLH1, MLH3, MSH2, MSH3, MSH6, MUTYH, NBN, NF1, NTHL1, PALB2, PDGFRA, PMS2, POLD1, POLE, PTEN, RAD50, RAD51C, RAD51D, RNF43, RPS20, SDHA, SDHB, SDHC, SDHD, SMAD4, SMARCA4, STK11, TP53, TSC1, TSC2, VHL.  (3) s/p bilateral mastectomies 11/07/2018 showing  (a) on the right, no evidence of carcinoma  (b) on the left, no evidence of residual ductal carcinoma in situ  (4) ovarian cancer risk: BSO recommended given lack of family history information  (a) status post remote hysterectomy  (b) bilateral salpingo-oophorectomy 03/31/2019, with benign pathology.  (5) pancreatic cancer risk:  (a) MRI of the abdomen with and without contrast June 29, 2017 documented 0.8 cm cyst in the tail of the pancreas appearing to communicate with the main duct.    (b) MRCP 07/04/2018 shows the known cyst in the tail of the pancreas to measure 0.7 cm   (c) CT of the abdomen and pelvis November 2020 showed no evidence of ductal dilatation   PLAN:   We will proceed with ultrasound of the left chest wall given the palpable nodule in the left breast mastectomy scar. With regards to her unsatisfactory cosmetic outcome, she is now ready to see a different breast surgeon, sent an in basket to our nurse navigator to arrange for an appointment with a different breast surgeon. Since she does not know much of her family history, I believe it is reasonable to consider annual surveillance of the pancreas for pancreatic cancer especially  with her PAL B2 gene.  I encouraged her to schedule a follow-up with her gastroenterologist to discuss about this. She already had bilateral salpingo-oophorectomy. Total time spent: 30 minutes  *Total Encounter Time as defined by the Centers for Medicare and Medicaid Services includes, in addition to the face-to-face time of a patient visit (documented in the note above) non-face-to-face time: obtaining and reviewing outside history, ordering and reviewing medications, tests or procedures, care coordination (communications with other health care professionals or caregivers) and documentation in the medical record.

## 2022-04-21 ENCOUNTER — Other Ambulatory Visit: Payer: Self-pay | Admitting: Hematology and Oncology

## 2022-04-21 ENCOUNTER — Encounter: Payer: Self-pay | Admitting: *Deleted

## 2022-04-21 DIAGNOSIS — D0512 Intraductal carcinoma in situ of left breast: Secondary | ICD-10-CM

## 2022-04-24 ENCOUNTER — Ambulatory Visit (INDEPENDENT_AMBULATORY_CARE_PROVIDER_SITE_OTHER): Payer: Medicare Other | Admitting: Cardiology

## 2022-04-24 ENCOUNTER — Encounter: Payer: Self-pay | Admitting: Cardiology

## 2022-04-24 VITALS — BP 116/72 | HR 57 | Ht 63.0 in | Wt 153.0 lb

## 2022-04-24 DIAGNOSIS — E7800 Pure hypercholesterolemia, unspecified: Secondary | ICD-10-CM | POA: Diagnosis not present

## 2022-04-24 DIAGNOSIS — I6523 Occlusion and stenosis of bilateral carotid arteries: Secondary | ICD-10-CM

## 2022-04-24 DIAGNOSIS — I10 Essential (primary) hypertension: Secondary | ICD-10-CM

## 2022-04-24 DIAGNOSIS — I25118 Atherosclerotic heart disease of native coronary artery with other forms of angina pectoris: Secondary | ICD-10-CM

## 2022-04-24 DIAGNOSIS — I739 Peripheral vascular disease, unspecified: Secondary | ICD-10-CM

## 2022-04-24 NOTE — Patient Instructions (Signed)
Medication Instructions:  Continue same medications *If you need a refill on your cardiac medications before your next appointment, please call your pharmacy*   Lab Work: Have Lipid and Hepatic panels  If you have labs (blood work) drawn today and your tests are completely normal, you will receive your results only by: [place] (if you have MyChart) OR A paper copy in the mail If you have any lab test that is abnormal or we need to change your treatment, we will call you to review the results.   Testing/Procedures: None ordered   Follow-Up: At [facility], you and your health needs are our priority.  As part of our continuing mission to provide you with exceptional heart care, we have created designated Provider Care Teams.  These Care Teams include your primary Cardiologist (physician) and Advanced Practice Providers (APPs -  Physician Assistants and Nurse Practitioners) who all work together to provide you with the care you need, when you need it.  We recommend signing up for the patient portal called "MyChart".  Sign up information is provided on this After Visit Summary.  MyChart is used to connect with patients for Virtual Visits (Telemedicine).  Patients are able to view lab/test results, encounter notes, upcoming appointments, etc.  Non-urgent messages can be sent to your provider as well.   To learn more about what you can do with MyChart, go to [email or web address].      Your next appointment:  1 year   Call in April to schedule August appointment     The format for your next appointment:  Office   Provider:  Dr.Crenshaw   Important Information About Sugar

## 2022-04-24 NOTE — Progress Notes (Signed)
HPI: FU coronary artery disease and cerebrovascular disease. Patient is status post coronary artery bypass graft in 2001 (LIMA to the LAD, sequential saphenous vein graft to the distal right coronary and PDA, sequential saphenous vein graft to the ramus and obtuse marginal). She also had carotid endarterectomy at that time as well. Renal Dopplers in January of 2012 showed normal renal arteries and no abdominal aortic aneurysm. Cardiac catheterization June 2019 showed normal LV function, 80% left main, occluded right coronary artery, 95% ramus and 100% obtuse marginal.  Sequential saphenous vein graft to the PLA-PDA and LIMA to the LAD patent. Sequential saphenous vein graft to the obtuse marginal and ramus intermedius occluded.  Medical therapy recommended. Peripheral vascular disease followed by vascular surgery. Echocardiogram September 2022 showed ejection fraction greater than 75%, moderate left ventricular hypertrophy, moderate left atrial enlargement, mild mitral regurgitation, prolapse of the anterior mitral valve leaflet.  Carotid Dopplers December 2022 showed 1 to 39% right and patent previous carotid endarterectomy on the left; bilateral subclavian stenosis.  ABIs February 2023 normal.  Since last seen she denies dyspnea, exertional chest pain, palpitations or syncope.  Current Outpatient Medications  Medication Sig Dispense Refill   amLODipine (NORVASC) 10 MG tablet Take 1 tablet (10 mg total) by mouth daily. 90 tablet 3   aspirin EC 81 MG tablet Take 1 tablet (81 mg total) by mouth daily. Swallow whole. 90 tablet 3   carboxymethylcellulose (REFRESH PLUS) 0.5 % SOLN Apply to eye.     FIBER ADULT GUMMIES PO Take by mouth.     gabapentin (NEURONTIN) 300 MG capsule Take 1 capsule (300 mg total) by mouth 3 (three) times daily as needed (nerve pain). 90 capsule 3   metoprolol succinate (TOPROL-XL) 100 MG 24 hr tablet Take with or immediately following a meal. 90 tablet 0   nitroGLYCERIN  (NITROSTAT) 0.4 MG SL tablet Place 1 tablet (0.4 mg total) under the tongue every 5 (five) minutes as needed for chest pain. 1 tablet under tongue every 5 minutes as needed for chest discomfort (max 2 Tab/day) 25 tablet 4   Potassium Chloride ER 20 MEQ TBCR TAKE 1 TABLET BY MOUTH ON MONDAY,WEDNESDAYS,FRIDAYS AND SUNDAYS 45 tablet 3   rosuvastatin (CRESTOR) 40 MG tablet Take 1 tablet by mouth once daily 90 tablet 3   triamcinolone cream (KENALOG) 0.1 % Apply 1 application. topically 2 (two) times daily. 100 g 0   Vitamin D, Ergocalciferol, 50000 units CAPS Take 1 capsule by mouth 4 (four) times a week.     No current facility-administered medications for this visit.     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, Grade 2, Stage 0 (ER/PR +);  11-07-2018  s/p  bilateral mastectomies   Carotid artery stenosis    bilateral---  04-27-2000  s/p  left carotid endarterectomy:  last doppler in epic 08-13-2018  right ICA 40-59%,  right ECA >50%,  left ICA 1-39% ,  bilateral subclavians stenotic   CHF (congestive heart failure) ([place])    Chronic stable angina ([place])    followed by cardiology   Complication of anesthesia    PONV   Coronary artery disease cardiologist-- dr  Gwenetta Devos   06-12-2000  s/p  CABG x5  :  cardiac cath 02-21-2018 --  normal LVF, 80% LM, occluded RCA, 95% Ramus and 100% OM,  Patent seqSVG to  PLA-PDA & LIMA to LAD,  seqSVG to OM and RI occluded   Depression    Family history of lung cancer    History of blood transfusion    History of CVA with residual deficit    short term memory loss per pt   History of Helicobacter pylori infection 2001   Hyperlipidemia    Hypertension    Hyperthyroidism    dx yrs ago, per pt have  taken medication for awhile   Mild mitral regurgitation    per echo 06/ 2019   Peripheral vascular disease ([place])    managed by vascular-- dr Donzetta Matters   PONV (postoperative nausea and vomiting)    S/P CABG x 5 06-12-2000   by dr Lucianne Lei tright [facility]    Severe obstructive sleep apnea-hypopnea syndrome 02/25/2021   Stroke ([place]) 2002   mild memory loss    Past Surgical History:  Procedure Laterality Date   ABDOMINAL AORTOGRAM W/LOWER EXTREMITY N/A 06/13/2017   Procedure: ABDOMINAL AORTOGRAM W/LOWER EXTREMITY;  Surgeon: Waynetta Sandy, MD;  Location: [place] [facility];  Service: Cardiovascular;  Laterality: N/A;  Bilateral   ABDOMINAL HYSTERECTOMY  1998   BREAST LUMPECTOMY WITH AXILLARY LYMPH NODE DISSECTION Left 1999   CARDIAC CATHETERIZATION  04-24-2000  dr Caryl Comes   3V CAD , subtotal PDA w/ slow flow colleterals , ef 48%   CARDIAC CATHETERIZATION  06/11/2000   3V CAD with progression of LAD disease   CAROTID ENDARTERECTOMY Left 04-27-2000   dr Amedeo Plenty  '[facility]    COLONOSCOPY  06/24/2019   [facility]   CORONARY ARTERY BYPASS GRAFT  06-12-2000   dr Lucianne Lei tright [facility]    x 5 (left internal mammary artery to left anterior  descending coronary artery   LEFT HEART CATH AND CORS/GRAFTS ANGIOGRAPHY N/A 02/21/2018   Procedure: LEFT HEART CATH AND CORS/GRAFTS ANGIOGRAPHY;  Surgeon: Lorretta Harp, MD;  Location: [place] [facility];  Service: Cardiovascular;  Laterality: N/A;   PERIPHERAL VASCULAR INTERVENTION  06/13/2017   Procedure: PERIPHERAL VASCULAR INTERVENTION;  Surgeon: Waynetta Sandy, MD;  Location: [place] [facility];  Service: Cardiovascular;;  Bilateral Iliac Stents   ROBOTIC ASSISTED SALPINGO OOPHERECTOMY Bilateral 03/13/2019   Procedure: XI ROBOTIC ASSISTED BILATERAL  SALPINGO OOPHORECTOMY;  Surgeon: Everitt Amber, MD;  Location: [place];  Service: Gynecology;  Laterality: Bilateral;   TOTAL MASTECTOMY Bilateral 11/07/2018   Procedure: BILATERAL MASTECTOMIES;  Surgeon:  Stark Klein, MD;  Location: [place];  Service: General;  Laterality: Bilateral;    Social History   Socioeconomic History   Marital status: Single    Spouse name: Not on file   Number of children: 1   Years of education: Not on file   Highest education level: Not on file  Occupational History   Occupation: Education officer, museum, 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  Tobacco Use   Smoking status: Former    Years: 17.00    Types: Cigarettes    Quit date: 09/11/2001    Years since quitting: 20.6   Smokeless tobacco: Never  Vaping Use   Vaping Use: Never used  Substance and Sexual Activity   Alcohol use: Not Currently    Comment: Quit 1998   Drug use: No   Sexual activity: Not on file    Comment:  Hysterectomy  Other Topics Concern   Not on file  Social History Narrative   Not on file   Social Determinants of Health   Financial Resource Strain: Low Risk  (05/20/2021)   Overall Financial Resource Strain (CARDIA)    Difficulty of Paying Living Expenses: Not hard at all  Food Insecurity: No Food Insecurity (05/20/2021)   Hunger Vital Sign    Worried About Running Out of Food in the Last Year: Never true    [place] in the Last Year: Never true  Transportation Needs: No Transportation Needs (05/20/2021)   PRAPARE - Hydrologist (Medical): No    Lack of Transportation (Non-Medical): No  Physical Activity: Sufficiently Active (05/20/2021)   Exercise Vital Sign    Days of Exercise per Week: 5 days    Minutes of Exercise per Session: 30 min  Stress: No Stress Concern Present (05/20/2021)   [place]    Feeling of Stress : Not at all  Social Connections: Moderately Isolated (05/20/2021)   Social Connection and Isolation Panel [NHANES]    Frequency of Communication with Friends and Family: More than three times a week    Frequency of Social Gatherings with Friends and  Family: More than three times a week    Attends Religious Services: More than 4 times per year    Active Member of [employer] or Organizations: No    Attends Archivist Meetings: Never    Marital Status: Never married  Intimate Partner Violence: Not At Risk (05/20/2021)   Humiliation, Afraid, Rape, and Kick questionnaire    Fear of Current or Ex-Partner: No    Emotionally Abused: No    Physically Abused: No    Sexually Abused: No    Family History  Problem Relation Age of Onset   Lung cancer Mother 40       died at an early age   Other Other        There is no Hx of premature coronary artery disease in her family   Colon cancer Neg Hx    Rectal cancer Neg Hx    Stomach cancer Neg Hx     ROS: no fevers or chills, productive cough, hemoptysis, dysphasia, odynophagia, melena, hematochezia, dysuria, hematuria, rash, seizure activity, orthopnea, PND, pedal edema, claudication. Remaining systems are negative.  Physical Exam: Well-developed well-nourished in no acute distress.  Skin is warm and dry.  HEENT is normal.  Neck is supple.  Chest is clear to auscultation with normal expansion.  Cardiovascular exam is regular rate and rhythm.  Abdominal exam nontender or distended. No masses palpated. Extremities show no edema. neuro grossly intact  ECG-sinus bradycardia at a rate of 57, inferolateral T wave inversion.  Personally reviewed  A/P  1 coronary artery disease-patient doing well with no chest pain.  Plan to continue medical therapy with aspirin and statin.  2 carotid artery disease-she will need follow-up carotid Dopplers December 2023.  3 hypertension-patient's blood pressure is controlled.  Continue present medications.  4 hyperlipidemia-continue statin.  We will recheck lipids.  If LDL greater than 50 will add Zetia 10 mg daily.  Kirk Ruths, MD

## 2022-04-25 ENCOUNTER — Ambulatory Visit (HOSPITAL_COMMUNITY)
Admission: RE | Admit: 2022-04-25 | Discharge: 2022-04-25 | Disposition: A | Discharge status: Home / Self Care | Payer: Medicare Other | Source: Ambulatory Visit | Attending: Family Medicine | Admitting: Family Medicine

## 2022-04-25 DIAGNOSIS — R202 Paresthesia of skin: Secondary | ICD-10-CM | POA: Diagnosis present

## 2022-04-25 LAB — HEPATIC FUNCTION PANEL
ALT: 16 IU/L (ref 0–32)
AST: 25 IU/L (ref 0–40)
Albumin: 4.3 g/dL (ref 3.9–4.9)
Alkaline Phosphatase: 72 IU/L (ref 44–121)
Bilirubin Total: 0.9 mg/dL (ref 0.0–1.2)
Bilirubin, Direct: 0.2 mg/dL (ref 0.00–0.40)
Total Protein: 7.3 g/dL (ref 6.0–8.5)

## 2022-04-25 LAB — LIPID PANEL
Chol/HDL Ratio: 2.8 ratio (ref 0.0–4.4)
Cholesterol, Total: 148 mg/dL (ref 100–199)
HDL: 52 mg/dL (ref >39)
LDL Chol Calc (NIH): 83 mg/dL (ref 0–99)
Triglycerides: 66 mg/dL (ref 0–149)
VLDL Cholesterol Cal: 13 mg/dL (ref 5–40)

## 2022-04-25 NOTE — Progress Notes (Signed)
Vascular ultrasound shows no evidence of restricted artery blood flow in the leg.  The stent still looks good.

## 2022-04-26 ENCOUNTER — Ambulatory Visit (INDEPENDENT_AMBULATORY_CARE_PROVIDER_SITE_OTHER): Payer: Medicare Other | Admitting: Plastic Surgery

## 2022-04-26 ENCOUNTER — Ambulatory Visit: Payer: Medicare Other | Admitting: Family Medicine

## 2022-04-26 ENCOUNTER — Other Ambulatory Visit: Payer: Self-pay

## 2022-04-26 VITALS — BP 112/68 | HR 64 | Ht 65.0 in | Wt 151.8 lb

## 2022-04-26 DIAGNOSIS — E7800 Pure hypercholesterolemia, unspecified: Secondary | ICD-10-CM

## 2022-04-26 DIAGNOSIS — Z1501 Genetic susceptibility to malignant neoplasm of breast: Secondary | ICD-10-CM | POA: Diagnosis not present

## 2022-04-26 DIAGNOSIS — N6459 Other signs and symptoms in breast: Secondary | ICD-10-CM | POA: Diagnosis not present

## 2022-04-26 DIAGNOSIS — Z923 Personal history of irradiation: Secondary | ICD-10-CM

## 2022-04-26 DIAGNOSIS — D0512 Intraductal carcinoma in situ of left breast: Secondary | ICD-10-CM

## 2022-04-26 DIAGNOSIS — G8929 Other chronic pain: Secondary | ICD-10-CM

## 2022-04-26 MED ORDER — PREDNISONE 50 MG PO TABS: ORAL_TABLET | ORAL | 0 refills | Status: DC

## 2022-04-26 MED ORDER — EZETIMIBE 10 MG PO TABS: 10.0000 mg | ORAL_TABLET | Freq: Every day | ORAL | 3 refills | Status: DC

## 2022-04-27 NOTE — Progress Notes (Signed)
Referring Provider [name], MD [place],   [id number]   CC:  Excess skin right lateral breast after mastectomy   Brenda [name] is an 66 y.o. female.  HPI: 66 year old who has had bilateral mastectomies.  She is BRKA positive.  She has a history of left breast cancer and radiation on the left.  She has CHF and a history of stroke.  She is bothered by excess skin right axilla.  She is concerned about a small area of firmness in the left chest.    Allergies  Allergen Reactions   Adhesive [Tape] Itching   Latex Rash    Outpatient Encounter Medications as of 04/26/2022  Medication Sig   amLODipine (NORVASC) 10 MG tablet Take 1 tablet (10 mg total) by mouth daily.   aspirin EC 81 MG tablet Take 1 tablet (81 mg total) by mouth daily. Swallow whole.   carboxymethylcellulose (REFRESH PLUS) 0.5 % SOLN Apply to eye.   FIBER ADULT GUMMIES PO Take by mouth.   gabapentin (NEURONTIN) 300 MG capsule Take 1 capsule (300 mg total) by mouth 3 (three) times daily as needed (nerve pain).   metoprolol succinate (TOPROL-XL) 100 MG 24 hr tablet Take with or immediately following a meal.   nitroGLYCERIN (NITROSTAT) 0.4 MG SL tablet Place 1 tablet (0.4 mg total) under the tongue every 5 (five) minutes as needed for chest pain. 1 tablet under tongue every 5 minutes as needed for chest discomfort (max 2 Tab/day)   Potassium Chloride ER 20 MEQ TBCR TAKE 1 TABLET BY MOUTH ON MONDAY,WEDNESDAYS,FRIDAYS AND SUNDAYS   predniSONE (DELTASONE) 50 MG tablet Take 1 pill daily for 5 days   rosuvastatin (CRESTOR) 40 MG tablet Take 1 tablet by mouth once daily   triamcinolone cream (KENALOG) 0.1 % Apply 1 application. topically 2 (two) times daily.   Vitamin D, Ergocalciferol, 50000 units CAPS Take 1 capsule by mouth 4 (four) times a week.   No facility-administered encounter medications on file as of 04/26/2022.     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, Grade 2, Stage 0 (ER/PR +);  11-07-2018  s/p  bilateral mastectomies   Carotid artery stenosis    bilateral---  04-27-2000  s/p  left carotid endarterectomy:  last doppler in epic 08-13-2018  right ICA 40-59%,  right ECA >50%,  left ICA 1-39% ,  bilateral subclavians stenotic   CHF (congestive heart failure) ([place])    Chronic stable angina ([place])    followed by cardiology   Complication of anesthesia    PONV   Coronary artery disease cardiologist-- dr Stanford Breed   06-12-2000  s/p  CABG x5  :  cardiac cath 02-21-2018 --  normal LVF, 80% LM, occluded RCA, 95% Ramus and 100% OM,  Patent seqSVG to  PLA-PDA & LIMA to LAD,  seqSVG to OM and RI occluded   Depression    Family history of lung cancer    History of blood transfusion    History of CVA with residual deficit    short term memory loss per pt   History of Helicobacter pylori infection 2001   Hyperlipidemia  Hypertension    Hyperthyroidism    dx yrs ago, per pt have taken medication for awhile   Mild mitral regurgitation    per echo 06/ 2019   Peripheral vascular disease ([place])    managed by vascular-- dr Donzetta Matters   PONV (postoperative nausea and vomiting)    S/P CABG x 5 06-12-2000   by dr Lucianne Lei tright [facility]    Severe obstructive sleep apnea-hypopnea syndrome 02/25/2021   Stroke ([place]) 2002   mild memory loss    Past Surgical History:  Procedure Laterality Date   ABDOMINAL AORTOGRAM W/LOWER EXTREMITY N/A 06/13/2017   Procedure: ABDOMINAL AORTOGRAM W/LOWER EXTREMITY;  Surgeon: Waynetta Sandy, MD;  Location: [place] [facility];  Service: Cardiovascular;  Laterality: N/A;  Bilateral   ABDOMINAL HYSTERECTOMY  1998   BREAST LUMPECTOMY WITH AXILLARY LYMPH NODE DISSECTION Left 1999   CARDIAC  CATHETERIZATION  04-24-2000  dr Caryl Comes   3V CAD , subtotal PDA w/ slow flow colleterals , ef 48%   CARDIAC CATHETERIZATION  06/11/2000   3V CAD with progression of LAD disease   CAROTID ENDARTERECTOMY Left 04-27-2000   dr Amedeo Plenty  '[facility]    COLONOSCOPY  06/24/2019   [facility]   CORONARY ARTERY BYPASS GRAFT  06-12-2000   dr Lucianne Lei tright [facility]    x 5 (left internal mammary artery to left anterior  descending coronary artery   LEFT HEART CATH AND CORS/GRAFTS ANGIOGRAPHY N/A 02/21/2018   Procedure: LEFT HEART CATH AND CORS/GRAFTS ANGIOGRAPHY;  Surgeon: Lorretta Harp, MD;  Location: [place] [facility];  Service: Cardiovascular;  Laterality: N/A;   PERIPHERAL VASCULAR INTERVENTION  06/13/2017   Procedure: PERIPHERAL VASCULAR INTERVENTION;  Surgeon: Waynetta Sandy, MD;  Location: [place] [facility];  Service: Cardiovascular;;  Bilateral Iliac Stents   ROBOTIC ASSISTED SALPINGO OOPHERECTOMY Bilateral 03/13/2019   Procedure: XI ROBOTIC ASSISTED BILATERAL  SALPINGO OOPHORECTOMY;  Surgeon: Everitt Amber, MD;  Location: [place];  Service: Gynecology;  Laterality: Bilateral;   TOTAL MASTECTOMY Bilateral 11/07/2018   Procedure: BILATERAL MASTECTOMIES;  Surgeon: Stark Klein, MD;  Location: [place];  Service: General;  Laterality: Bilateral;    Family History  Problem Relation Age of Onset   Lung cancer Mother 93       died at an early age   Other Other        There is no Hx of premature coronary artery disease in her family   Colon cancer Neg Hx    Rectal cancer Neg Hx    Stomach cancer Neg Hx     Social History   Social History Narrative   Not on file     Review of Systems General: Denies fevers, chills, weight loss CV: Denies chest pain, shortness of breath, palpitations   Physical Exam    04/26/2022    2:03 PM 04/24/2022    1:40 PM 04/20/2022   12:40 PM  Vitals with BMI  Height '5\' 5"'$  '5\' 3"'$    Weight 151 lbs 13 oz 153 lbs 152 lbs 5 oz  BMI 25.26 25.05  39.76  Systolic 734 193 790  Diastolic 68 72 68  Pulse 64 57 64    General:  No acute distress,  Alert and oriented, Non-Toxic, Normal speech and affect Bilateral mastectomy scars, excess skin right axilla as transitions into the breast.  She has no clearly palpable mass on the left.  Assessment/Plan Possible candidate for local tissue rearrangement on the right for better contour of the mastectomy site.  She would need to be medically cleared and wants to check her left chest site first.    Lennice Sites 04/27/2022, 9:10 PM

## 2022-05-04 ENCOUNTER — Encounter: Payer: Self-pay | Admitting: Gastroenterology

## 2022-05-12 ENCOUNTER — Other Ambulatory Visit: Payer: Self-pay | Admitting: Hematology and Oncology

## 2022-05-12 ENCOUNTER — Ambulatory Visit
Admission: RE | Admit: 2022-05-12 | Discharge: 2022-05-12 | Disposition: A | Discharge status: Home / Self Care | Payer: Medicare Other | Source: Ambulatory Visit | Attending: Hematology and Oncology | Admitting: Hematology and Oncology

## 2022-05-12 DIAGNOSIS — D0512 Intraductal carcinoma in situ of left breast: Secondary | ICD-10-CM

## 2022-05-12 DIAGNOSIS — N6320 Unspecified lump in the left breast, unspecified quadrant: Secondary | ICD-10-CM

## 2022-05-18 ENCOUNTER — Ambulatory Visit
Admission: RE | Admit: 2022-05-18 | Discharge: 2022-05-18 | Disposition: A | Discharge status: Home / Self Care | Payer: Medicare Other | Source: Ambulatory Visit | Attending: Hematology and Oncology | Admitting: Hematology and Oncology

## 2022-05-18 ENCOUNTER — Other Ambulatory Visit: Payer: Self-pay | Admitting: Hematology and Oncology

## 2022-05-18 DIAGNOSIS — N6320 Unspecified lump in the left breast, unspecified quadrant: Secondary | ICD-10-CM

## 2022-05-24 ENCOUNTER — Ambulatory Visit: Payer: Medicare Other | Admitting: Hematology and Oncology

## 2022-05-24 ENCOUNTER — Other Ambulatory Visit: Payer: Medicare Other

## 2022-05-26 ENCOUNTER — Ambulatory Visit (INDEPENDENT_AMBULATORY_CARE_PROVIDER_SITE_OTHER): Payer: Medicare Other

## 2022-05-26 VITALS — Ht 65.0 in | Wt 150.0 lb

## 2022-05-26 DIAGNOSIS — Z0000 Encounter for general adult medical examination without abnormal findings: Secondary | ICD-10-CM | POA: Diagnosis not present

## 2022-05-26 NOTE — Patient Instructions (Signed)
Brenda [name] , Thank you for taking time to come for your Medicare Wellness Visit. I appreciate your ongoing commitment to your health goals. Please review the following plan we discussed and let me know if I can assist you in the future.   Screening recommendations/referrals: Colonoscopy: 08/18/2019  due 2030 Mammogram: no longer required  Bone Density: 01/24/11 Recommended yearly ophthalmology/optometry visit for glaucoma screening and checkup Recommended yearly dental visit for hygiene and checkup  Vaccinations: Influenza vaccine: due in the fall  Pneumococcal vaccine: completed  Tdap vaccine: 11/20/2018 Shingles vaccine: completed per patient    Covid-19:completed   Advanced directives: [place] Chart   Conditions/risks identified: Aim for 30 minutes of exercise or brisk walking, 6-8 glasses of water, and 5 servings of fruits and vegetables each day.   Next appointment: Follow up in one year for your annual wellness visit    Preventive Care 65 Years and Older, Female Preventive care refers to lifestyle choices and visits with your health care provider that can promote health and wellness. What does preventive care include? A yearly physical exam. This is also called an annual well check. Dental exams once or twice a year. Routine eye exams. Ask your health care provider how often you should have your eyes checked. Personal lifestyle choices, including: Daily care of your teeth and gums. Regular physical activity. Eating a healthy diet. Avoiding tobacco and drug use. Limiting alcohol use. Practicing safe sex. Taking low-dose aspirin every day. Taking vitamin and mineral supplements as recommended by your health care provider. What happens during an annual well check? The services and screenings done by your health care provider during your annual well check will depend on your age, overall health, lifestyle risk factors, and family history of disease. Counseling  Your health care  provider may ask you questions about your: Alcohol use. Tobacco use. Drug use. Emotional well-being. Home and relationship well-being. Sexual activity. Eating habits. History of falls. Memory and ability to understand (cognition). Work and work Statistician. Reproductive health. Screening  You may have the following tests or measurements: Height, weight, and BMI. Blood pressure. Lipid and cholesterol levels. These may be checked every 5 years, or more frequently if you are over 8 years old. Skin check. Lung cancer screening. You may have this screening every year starting at age 8 if you have a 30-pack-year history of smoking and currently smoke or have quit within the past 15 years. Fecal occult blood test (FOBT) of the stool. You may have this test every year starting at age 33. Flexible sigmoidoscopy or colonoscopy. You may have a sigmoidoscopy every 5 years or a colonoscopy every 10 years starting at age 40. Hepatitis C blood test. Hepatitis B blood test. Sexually transmitted disease (STD) testing. Diabetes screening. This is done by checking your blood sugar (glucose) after you have not eaten for a while (fasting). You may have this done every 1-3 years. Bone density scan. This is done to screen for osteoporosis. You may have this done starting at age 34. Mammogram. This may be done every 1-2 years. Talk to your health care provider about how often you should have regular mammograms. Talk with your health care provider about your test results, treatment options, and if necessary, the need for more tests. Vaccines  Your health care provider may recommend certain vaccines, such as: Influenza vaccine. This is recommended every year. Tetanus, diphtheria, and acellular pertussis (Tdap, Td) vaccine. You may need a Td booster every 10 years. Zoster vaccine. You may need  this after age 90. Pneumococcal 13-valent conjugate (PCV13) vaccine. One dose is recommended after age  44. Pneumococcal polysaccharide (PPSV23) vaccine. One dose is recommended after age 97. Talk to your health care provider about which screenings and vaccines you need and how often you need them. This information is not intended to replace advice given to you by your health care provider. Make sure you discuss any questions you have with your health care provider. Document Released: 09/24/2015 Document Revised: 05/17/2016 Document Reviewed: 06/29/2015 Elsevier Interactive Patient Education  2017 [place] Prevention in the Home Falls can cause injuries. They can happen to people of all ages. There are many things you can do to make your home safe and to help prevent falls. What can I do on the outside of my home? Regularly fix the edges of walkways and driveways and fix any cracks. Remove anything that might make you trip as you walk through a door, such as a raised step or threshold. Trim any bushes or trees on the path to your home. Use bright outdoor lighting. Clear any walking paths of anything that might make someone trip, such as rocks or tools. Regularly check to see if handrails are loose or broken. Make sure that both sides of any steps have handrails. Any raised decks and porches should have guardrails on the edges. Have any leaves, snow, or ice cleared regularly. Use sand or salt on walking paths during winter. Clean up any spills in your garage right away. This includes oil or grease spills. What can I do in the bathroom? Use night lights. Install grab bars by the toilet and in the tub and shower. Do not use towel bars as grab bars. Use non-skid mats or decals in the tub or shower. If you need to sit down in the shower, use a plastic, non-slip stool. Keep the floor dry. Clean up any water that spills on the floor as soon as it happens. Remove soap buildup in the tub or shower regularly. Attach bath mats securely with double-sided non-slip rug tape. Do not have throw  rugs and other things on the floor that can make you trip. What can I do in the bedroom? Use night lights. Make sure that you have a light by your bed that is easy to reach. Do not use any sheets or blankets that are too big for your bed. They should not hang down onto the floor. Have a firm chair that has side arms. You can use this for support while you get dressed. Do not have throw rugs and other things on the floor that can make you trip. What can I do in the kitchen? Clean up any spills right away. Avoid walking on wet floors. Keep items that you use a lot in easy-to-reach places. If you need to reach something above you, use a strong step stool that has a grab bar. Keep electrical cords out of the way. Do not use floor polish or wax that makes floors slippery. If you must use wax, use non-skid floor wax. Do not have throw rugs and other things on the floor that can make you trip. What can I do with my stairs? Do not leave any items on the stairs. Make sure that there are handrails on both sides of the stairs and use them. Fix handrails that are broken or loose. Make sure that handrails are as long as the stairways. Check any carpeting to make sure that it is firmly attached to  the stairs. Fix any carpet that is loose or worn. Avoid having throw rugs at the top or bottom of the stairs. If you do have throw rugs, attach them to the floor with carpet tape. Make sure that you have a light switch at the top of the stairs and the bottom of the stairs. If you do not have them, ask someone to add them for you. What else can I do to help prevent falls? Wear shoes that: Do not have high heels. Have rubber bottoms. Are comfortable and fit you well. Are closed at the toe. Do not wear sandals. If you use a stepladder: Make sure that it is fully opened. Do not climb a closed stepladder. Make sure that both sides of the stepladder are locked into place. Ask someone to hold it for you, if  possible. Clearly mark and make sure that you can see: Any grab bars or handrails. First and last steps. Where the edge of each step is. Use tools that help you move around (mobility aids) if they are needed. These include: Canes. Walkers. Scooters. Crutches. Turn on the lights when you go into a dark area. Replace any light bulbs as soon as they burn out. Set up your furniture so you have a clear path. Avoid moving your furniture around. If any of your floors are uneven, fix them. If there are any pets around you, be aware of where they are. Review your medicines with your doctor. Some medicines can make you feel dizzy. This can increase your chance of falling. Ask your doctor what other things that you can do to help prevent falls. This information is not intended to replace advice given to you by your health care provider. Make sure you discuss any questions you have with your health care provider. Document Released: 06/24/2009 Document Revised: 02/03/2016 Document Reviewed: 10/02/2014 Elsevier Interactive Patient Education  2017 [employer].

## 2022-05-26 NOTE — Progress Notes (Signed)
Subjective:   Brenda [name] is a 66 y.o. female who presents for Medicare Annual (Subsequent) preventive examination.   Virtual Visit via Telephone Note  I connected with  Brenda [name] on 05/26/22 at  8:45 AM EDT by telephone and verified that I am speaking with the correct person using two identifiers.  Location: Patient: home  Provider: Paulla Fore  Persons participating in the virtual visit: [name]   I discussed the limitations, risks, security and privacy concerns of performing an evaluation and management service by telephone and the availability of in person appointments. The patient expressed understanding and agreed to proceed.  Interactive audio and video telecommunications were attempted between this nurse and patient, however failed, due to patient having technical difficulties OR patient did not have access to video capability.  We continued and completed visit with audio only.  Some vital signs may be absent or patient reported.   Daphane Shepherd, LPN  Review of Systems     Cardiac Risk Factors include: advanced age (>54mn, >>38women);dyslipidemia;hypertension     Objective:    Today's Vitals   05/26/22 0852  Weight: 150 lb (68 kg)  Height: '5\' 5"'$  (1.651 m)   Body mass index is 24.96 kg/m.     05/26/2022    9:03 AM 11/03/2021   10:18 AM 05/20/2021    5:59 PM 08/08/2019    5:23 AM 03/13/2019    6:48 AM 02/21/2019    8:38 AM 02/05/2019   10:26 AM  Advanced Directives  Does Patient Have a Medical Advance Directive? Yes Yes Yes No Yes Yes Yes  Type of AParamed[place]of [place] will  [place] will;Out of facility DNR (pink MOST or yellow form)  Living will [place] will [place] will  Does patient want to make changes to medical advance directive? No - Patient declined No - Patient declined No - Patient declined  No - Patient declined  No - Patient declined   Copy of [place] Chart? Yes - validated most recent copy scanned in chart (See row information)  Yes - validated most recent copy scanned in chart (See row information)   Yes - validated most recent copy scanned in chart (See row information) Yes - validated most recent copy scanned in chart (See row information)  Would patient like information on creating a medical advance directive?    No - Patient declined       Current Medications (verified) Outpatient Encounter Medications as of 05/26/2022  Medication Sig   amLODipine (NORVASC) 10 MG tablet Take 1 tablet (10 mg total) by mouth daily.   aspirin EC 81 MG tablet Take 1 tablet (81 mg total) by mouth daily. Swallow whole.   carboxymethylcellulose (REFRESH PLUS) 0.5 % SOLN Apply to eye.   ezetimibe (ZETIA) 10 MG tablet Take 1 tablet (10 mg total) by mouth daily.   FIBER ADULT GUMMIES PO Take by mouth.   gabapentin (NEURONTIN) 300 MG capsule Take 1 capsule (300 mg total) by mouth 3 (three) times daily as needed (nerve pain).   metoprolol succinate (TOPROL-XL) 100 MG 24 hr tablet Take with or immediately following a meal.   nitroGLYCERIN (NITROSTAT) 0.4 MG SL tablet Place 1 tablet (0.4 mg total) under the tongue every 5 (five) minutes as needed for chest pain. 1 tablet under tongue every 5 minutes as needed for chest discomfort (max 2 Tab/day)   Potassium Chloride ER  20 MEQ TBCR TAKE 1 TABLET BY MOUTH ON MONDAY,WEDNESDAYS,FRIDAYS AND SUNDAYS   rosuvastatin (CRESTOR) 40 MG tablet Take 1 tablet by mouth once daily   Vitamin D, Ergocalciferol, 50000 units CAPS Take 1 capsule by mouth 4 (four) times a week.   predniSONE (DELTASONE) 50 MG tablet Take 1 pill daily for 5 days (Patient not taking: Reported on 05/26/2022)   triamcinolone cream (KENALOG) 0.1 % Apply 1 application. topically 2 (two) times daily. (Patient not taking: Reported on 05/26/2022)   No facility-administered encounter medications on file as  of 05/26/2022.    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, Grade 2, Stage 0 (ER/PR +);  11-07-2018  s/p  bilateral mastectomies   Carotid artery stenosis    bilateral---  04-27-2000  s/p  left carotid endarterectomy:  last doppler in epic 08-13-2018  right ICA 40-59%,  right ECA >50%,  left ICA 1-39% ,  bilateral subclavians stenotic   CHF (congestive heart failure) ([place])    Chronic stable angina ([place])    followed by cardiology   Complication of anesthesia    PONV   Coronary artery disease cardiologist-- dr Stanford Breed   06-12-2000  s/p  CABG x5  :  cardiac cath 02-21-2018 --  normal LVF, 80% LM, occluded RCA, 95% Ramus and 100% OM,  Patent seqSVG to  PLA-PDA & LIMA to LAD,  seqSVG to OM and RI occluded   Depression    Family history of lung cancer    History of blood transfusion    History of CVA with residual deficit    short term memory loss per pt   History of Helicobacter pylori infection 2001   Hyperlipidemia    Hypertension    Hyperthyroidism    dx yrs ago, per pt have taken medication for awhile   Mild mitral regurgitation    per echo 06/ 2019   Peripheral vascular disease ([place])    managed by vascular-- dr Donzetta Matters   PONV (postoperative nausea and vomiting)    S/P CABG x 5 06-12-2000   by dr Lucianne Lei tright [facility]    Severe obstructive sleep apnea-hypopnea syndrome 02/25/2021   Stroke ([place]) 2002   mild memory loss   Past Surgical History:  Procedure Laterality Date   ABDOMINAL AORTOGRAM W/LOWER EXTREMITY N/A 06/13/2017   Procedure: ABDOMINAL AORTOGRAM W/LOWER EXTREMITY;  Surgeon: Waynetta Sandy, MD;  Location: [place] [facility];  Service: Cardiovascular;   Laterality: N/A;  Bilateral   ABDOMINAL HYSTERECTOMY  1998   BREAST LUMPECTOMY WITH AXILLARY LYMPH NODE DISSECTION Left 1999   CARDIAC CATHETERIZATION  04-24-2000  dr Caryl Comes   3V CAD , subtotal PDA w/ slow flow colleterals , ef 48%   CARDIAC CATHETERIZATION  06/11/2000   3V CAD with progression of LAD disease   CAROTID ENDARTERECTOMY Left 04-27-2000   dr Amedeo Plenty  '@[facility]    COLONOSCOPY  06/24/2019   [facility]   CORONARY ARTERY BYPASS GRAFT  06-12-2000   dr Lucianne Lei tright [facility]    x 5 (left internal mammary artery to left anterior  descending coronary artery   LEFT HEART CATH AND CORS/GRAFTS ANGIOGRAPHY N/A 02/21/2018  Procedure: LEFT HEART CATH AND CORS/GRAFTS ANGIOGRAPHY;  Surgeon: Lorretta Harp, MD;  Location: [place] [facility];  Service: Cardiovascular;  Laterality: N/A;   PERIPHERAL VASCULAR INTERVENTION  06/13/2017   Procedure: PERIPHERAL VASCULAR INTERVENTION;  Surgeon: Waynetta Sandy, MD;  Location: [place] [facility];  Service: Cardiovascular;;  Bilateral Iliac Stents   ROBOTIC ASSISTED SALPINGO OOPHERECTOMY Bilateral 03/13/2019   Procedure: XI ROBOTIC ASSISTED BILATERAL  SALPINGO OOPHORECTOMY;  Surgeon: Everitt Amber, MD;  Location: [facility];  Service: Gynecology;  Laterality: Bilateral;   TOTAL MASTECTOMY Bilateral 11/07/2018   Procedure: BILATERAL MASTECTOMIES;  Surgeon: Stark Klein, MD;  Location: [facility];  Service: General;  Laterality: Bilateral;   Family History  Problem Relation Age of Onset   Lung cancer Mother 34       died at an early age   Other Other        There is no Hx of premature coronary artery disease in her family   Colon cancer Neg Hx    Rectal cancer Neg Hx    Stomach cancer Neg Hx    Social History   Socioeconomic History   Marital status: Single    Spouse name: Not on file   Number of children: 1   Years of education: Not on file   Highest education level: Not on file  Occupational History   Occupation: Research officer, political party, 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  Tobacco Use   Smoking status: Former    Years: 17.00    Types: Cigarettes    Quit date: 09/11/2001    Years since quitting: 20.7   Smokeless tobacco: Never  Vaping Use   Vaping Use: Never used  Substance and Sexual Activity   Alcohol use: Not Currently    Comment: Quit 1998   Drug use: No   Sexual activity: Not on file    Comment: Hysterectomy  Other Topics Concern   Not on file  Social History Narrative   Not on file   Social Determinants of Health   Financial Resource Strain: Low Risk  (05/26/2022)   Overall Financial Resource Strain (CARDIA)    Difficulty of Paying Living Expenses: Not hard at all  Food Insecurity: No Food Insecurity (05/26/2022)   Hunger Vital Sign    Worried About Running Out of Food in the Last Year: Never true    [place] in the Last Year: Never true  Transportation Needs: No Transportation Needs (05/26/2022)   PRAPARE - Hydrologist (Medical): No    Lack of Transportation (Non-Medical): No  Physical Activity: Sufficiently Active (05/26/2022)   Exercise Vital Sign    Days of Exercise per Week: 3 days    Minutes of Exercise per Session: 60 min  Stress: No Stress Concern Present (05/26/2022)   [place]    Feeling of Stress : Not at all  Social Connections: Moderately Integrated (05/26/2022)   Social Connection and Isolation Panel [NHANES]    Frequency of Communication with Friends and Family: More than three times a week    Frequency of Social Gatherings with Friends and Family: More than three times a week    Attends Religious Services: More than 4 times per year    Active Member of [employer] or Organizations: Yes    Attends Music therapist: More than 4 times per year    Marital Status: Never married  Tobacco Counseling Counseling given: Not Answered   Clinical Intake:  Pre-visit  preparation completed: Yes  Pain : No/denies pain     Nutritional Risks: None Diabetes: No  How often do you need to have someone help you when you read instructions, pamphlets, or other written materials from your doctor or pharmacy?: 1 - Never  Diabetic?no   Interpreter Needed?: No  Information entered by :: Jadene Pierini, LPN   Activities of Daily Living    05/26/2022    9:03 AM  In your present state of health, do you have any difficulty performing the following activities:  Hearing? 0  Vision? 0  Difficulty concentrating or making decisions? 0  Walking or climbing stairs? 0  Dressing or bathing? 0  Doing errands, shopping? 0  Preparing Food and eating ? N  Using the Toilet? N  In the past six months, have you accidently leaked urine? N  Do you have problems with loss of bowel control? N  Managing your Medications? N  Managing your Finances? N  Housekeeping or managing your Housekeeping? N    Patient Care Team: [name], MD as PCP - General (Internal Medicine) Stanford Breed Denice Bors, MD as PCP - Cardiology (Cardiology) Reynold Bowen, MD as Consulting Physician (Endocrinology) Stark Klein, MD as Consulting Physician (General Surgery) Irene Limbo, MD as Consulting Physician (Plastic Surgery) Juanita Craver, MD as Consulting Physician (Gastroenterology) Everitt Amber, MD as Consulting Physician (Gynecologic Oncology) Charlton Haws, [facility] as Pharmacist (Pharmacist) Dohmeier, Asencion Partridge, MD as Consulting Physician (Neurology) Mauro Kaufmann, RN as Oncology Nurse Navigator Rockwell Germany, RN as Oncology Nurse Navigator Benay Pike, MD as Consulting Physician (Hematology and Oncology)  Indicate any recent Medical Services you may have received from other than [facility] providers in the past year (date may be approximate).     Assessment:   This is a routine wellness examination for K[place].  Hearing/Vision screen Vision Screening - Comments:: Annual  eye exam wears glasses /contacts  Dietary issues and exercise activities discussed: Current Exercise Habits: Home exercise routine, Time (Minutes): 30, Frequency (Times/Week): 3, Weekly Exercise (Minutes/Week): 90, Intensity: Mild, Exercise limited by: cardiac condition(s)   Goals Addressed             This Visit's Progress    DIET - INCREASE WATER INTAKE         Depression Screen    05/26/2022    9:01 AM 05/20/2021    5:50 PM 04/08/2021    1:42 PM 05/28/2019   10:55 AM 06/21/2017    2:38 PM  PHQ 2/9 Scores  PHQ - 2 Score 0 0 0 1 1    Fall Risk    05/26/2022    8:56 AM 05/20/2021    5:46 PM 01/31/2021    2:16 PM 05/28/2019   10:55 AM  [place] in the past year? 0 0 0 0  Number falls in past yr: 0 0    Injury with Fall? 0 0    Risk for fall due to : No Fall Risks     Follow up Falls prevention discussed       [place]:  Any stairs in or around the home? No  If so, are there any without handrails? No  Home free of loose throw rugs in walkways, pet beds, electrical cords, etc? Yes  Adequate lighting in your home to reduce risk of falls? Yes   ASSISTIVE DEVICES UTILIZED TO PREVENT FALLS:  Life alert? No  Use of a cane, walker or w/c? No  Grab bars in the bathroom? No  Shower chair or bench in shower? No  Elevated toilet seat or a handicapped toilet? No        01/31/2021    2:26 PM  Montreal Cognitive Assessment   Visuospatial/ Executive (0/5) 3  Naming (0/3) 3  Attention: Read list of digits (0/2) 1  Attention: Read list of letters (0/1) 1  Attention: Serial 7 subtraction starting at 100 (0/3) 2  Language: Repeat phrase (0/2) 2  Language : Fluency (0/1) 1  Abstraction (0/2) 2  Delayed Recall (0/5) 2  Orientation (0/6) 6  Total 23      05/26/2022    9:04 AM 05/20/2021    5:56 PM  6CIT Screen  What Year? 0 points 0 points  What month? 0 points 0 points  What time? 0 points 0 points  Count back from 20 0 points 0  points  Months in reverse 0 points 2 points  Repeat phrase 0 points 0 points  Total Score 0 points 2 points    Immunizations Immunization History  Administered Date(s) Administered   Fluad Quad(high Dose 65+) 05/23/2019   Influenza,inj,Quad PF,6+ Mos 08/20/2015, 06/21/2017   Influenza-Unspecified 06/11/2014, 07/13/2015, 05/29/2018   PFIZER Comirnaty(Gray Top)Covid-19 Tri-Sucrose Vaccine 10/16/2019, 11/06/2019, 06/28/2020, 02/22/2021   PNEUMOCOCCAL CONJUGATE-20 04/08/2021   Pneumococcal Polysaccharide-23 03/26/2019   Tdap 09/11/2008, 06/04/2018, 11/20/2018    TDAP status: Up to date  Flu Vaccine status: Due, Education has been provided regarding the importance of this vaccine. Advised may receive this vaccine at local pharmacy or Health Dept. Aware to provide a copy of the vaccination record if obtained from local pharmacy or Health Dept. Verbalized acceptance and understanding.  Pneumococcal vaccine status: Up to date  Covid-19 vaccine status: Completed vaccines  Qualifies for Shingles Vaccine? Yes   Zostavax completed No   Shingrix Completed?: No.    Education has been provided regarding the importance of this vaccine. Patient has been advised to call insurance company to determine out of pocket expense if they have not yet received this vaccine. Advised may also receive vaccine at local pharmacy or Health Dept. Verbalized acceptance and understanding.  Screening Tests Health Maintenance  Topic Date Due   Zoster Vaccines- Shingrix (1 of 2) Never done   COVID-19 Vaccine (5 - Pfizer risk series) 04/19/2021   INFLUENZA VACCINE  04/11/2022   TETANUS/TDAP  11/19/2028   COLONOSCOPY (Pts 45-81yr Insurance coverage will need to be confirmed)  08/17/2029   Pneumonia Vaccine 66 Years old  Completed   DEXA SCAN  Completed   Hepatitis C Screening  Completed   HPV VACCINES  Aged Out    Health Maintenance  Health Maintenance Due  Topic Date Due   Zoster Vaccines- Shingrix (1 of  2) Never done   COVID-19 Vaccine (5 - Pfizer risk series) 04/19/2021   INFLUENZA VACCINE  04/11/2022    Colorectal cancer screening: Type of screening: Colonoscopy. Completed 08/18/2019. Repeat every 10 years  Mammogram status: No longer required due to mastectomy .  Bone Density status: Completed 01/14/2011. Results reflect: Bone density results: NORMAL. Repeat every 10 years.  Lung Cancer Screening: (Low Dose CT Chest recommended if Age 68-80years, 30 pack-year currently smoking OR have quit w/in 15years.) does not qualify.   Lung Cancer Screening Referral: n/a  Additional Screening:  Hepatitis C Screening: does not qualify; Completed 05/21/2015  Vision Screening: Recommended annual ophthalmology exams for early detection  of glaucoma and other disorders of the eye. Is the patient up to date with their annual eye exam?  Yes  Who is the provider or what is the name of the office in which the patient attends annual eye exams? Dr.Dunn  If pt is not established with a provider, would they like to be referred to a provider to establish care? No .   Dental Screening: Recommended annual dental exams for proper oral hygiene  Community Resource Referral / Chronic Care Management: CRR required this visit?  No   CCM required this visit?  No      Plan:     I have personally reviewed and noted the following in the patient's chart:   Medical and social history Use of alcohol, tobacco or illicit drugs  Current medications and supplements including opioid prescriptions. Patient is not currently taking opioid prescriptions. Functional ability and status Nutritional status Physical activity Advanced directives List of other physicians Hospitalizations, surgeries, and ER visits in previous 12 months Vitals Screenings to include cognitive, depression, and falls Referrals and appointments  In addition, I have reviewed and discussed with patient certain preventive protocols, quality  metrics, and best practice recommendations. A written personalized care plan for preventive services as well as general preventive health recommendations were provided to patient.     Daphane Shepherd, LPN   02/21/2448   Nurse Notes: none

## 2022-05-28 ENCOUNTER — Other Ambulatory Visit: Payer: Self-pay | Admitting: Cardiology

## 2022-05-28 DIAGNOSIS — R0602 Shortness of breath: Secondary | ICD-10-CM

## 2022-05-28 DIAGNOSIS — I2510 Atherosclerotic heart disease of native coronary artery without angina pectoris: Secondary | ICD-10-CM

## 2022-05-28 DIAGNOSIS — I10 Essential (primary) hypertension: Secondary | ICD-10-CM

## 2022-05-28 DIAGNOSIS — E7800 Pure hypercholesterolemia, unspecified: Secondary | ICD-10-CM

## 2022-06-02 ENCOUNTER — Encounter: Payer: Self-pay | Admitting: Hematology and Oncology

## 2022-06-02 ENCOUNTER — Inpatient Hospital Stay: Payer: Medicare Other | Attending: Hematology and Oncology | Admitting: Hematology and Oncology

## 2022-06-02 DIAGNOSIS — D0512 Intraductal carcinoma in situ of left breast: Secondary | ICD-10-CM | POA: Diagnosis not present

## 2022-06-02 NOTE — Progress Notes (Signed)
Brenda [name]  [phone] [phone]    ID: Brenda [name] DOB: 08/23/1956  MR#: [id number]  CSN#:[id number]  Patient Care Team: [name], MD as PCP - General (Internal Medicine) Stanford Breed Denice Bors, MD as PCP - Cardiology (Cardiology) Reynold Bowen, MD as Consulting Physician (Endocrinology) Stark Klein, MD as Consulting Physician (General Surgery) Irene Limbo, MD as Consulting Physician (Plastic Surgery) Juanita Craver, MD as Consulting Physician (Gastroenterology) Everitt Amber, MD as Consulting Physician (Gynecologic Oncology) Charlton Haws, [facility] as Pharmacist (Pharmacist) Dohmeier, Asencion Partridge, MD as Consulting Physician (Neurology) Mauro Kaufmann, RN as Oncology Nurse Navigator Rockwell Germany, RN as Oncology Nurse Navigator Benay Pike, MD as Consulting Physician (Hematology and Oncology) OTHER MD:   CHIEF COMPLAINT: PALB2 POSITIVE ductal carcinoma in situ, estrogen receptor positive (s/p bilateral mastectomies)  CURRENT TREATMENT: Observation  INTERVAL HISTORY:  Brenda [name] was seen today for follow-up of her 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 She is pleased with pathology results. She quit her job, is less stressed, enjoying family time. She saw another plastic surgeon but is again dissatisfied with the visit.  She however tells me that she is at peace with not going through any surgeries at this time.  She is hoping to enjoy some volunteering and spent some time with grandkids.  Rest of the pertinent 10 point ROS reviewed and negative   COVID 19 VACCINATION STATUS: Pfizer x4, last 02/2021   HISTORY OF CURRENT ILLNESS: From the original intake note:  Brenda [name] has a remote history of breast cancer, and she underwent a lumpectomy of the left breast in 1999.  She does not remember the size of the tumor but knows that 8 lymph nodes were removed and that they were all clear.  She  was treated with chemotherapy and radiation.  She does not recall other details.  She did not receive antiestrogens  More recently, on 08/12/2018 Brenda [name] had routine screening mammography showing a possible abnormality in the left breast. She underwent unilateral left diagnostic mammography with tomography at [facility] on 08/15/2018 showing: Breast Density Category B. Spot compression magnification views were performed over the upper outer far posterior left breast. There are grouped microcalcifications in the region of the lumpectomy site in the far upper outer posterior left breast varying in shape, size, and density, spanning approximately 2.7 cm.   Accordingly on 08/19/2018 she proceeded to biopsy of the left breast area in question. The pathology from this procedure showed ([id number]): ductal carcinoma in situ, intermediate nuclear grade with calcifications.  Prognostic indicators significant for: estrogen receptor, 100% positive and progesterone receptor, 90% positive, both with strong staining intensity.   The patient's subsequent history is as detailed above.   PAST MEDICAL HISTORY: Past Medical History:  Diagnosis Date   Alcoholism [facility])    Quit 1998   Allergy    Anemia    Anxiety    Breast cancer, left [facility]) oncologist-- dr Jana Hakim   dx 1999,  DCIS;  s/p  left lumpectomy w/ node dissection's;  completed chemo and radiation 2000:  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, Grade 2, Stage 0 (ER/PR +);  11-07-2018  s/p  bilateral mastectomies   Carotid artery stenosis    bilateral---  04-27-2000  s/p  left carotid endarterectomy:  last doppler in epic 08-13-2018  right ICA 40-59%,  right ECA >50%,  left ICA 1-39% ,  bilateral subclavians stenotic   CHF (congestive heart failure) ([place])  Chronic stable angina (HCC)    followed by cardiology   Complication of anesthesia    PONV   Coronary artery disease cardiologist-- dr Stanford Breed   06-12-2000  s/p  CABG x5  :  cardiac  cath 02-21-2018 --  normal LVF, 80% LM, occluded RCA, 95% Ramus and 100% OM,  Patent seqSVG to  PLA-PDA & LIMA to LAD,  seqSVG to OM and RI occluded   Depression    Family history of lung cancer    History of blood transfusion    History of CVA with residual deficit    short term memory loss per pt   History of Helicobacter pylori infection 2001   Hyperlipidemia    Hypertension    Hyperthyroidism    dx yrs ago, per pt have taken medication for awhile   Mild mitral regurgitation    per echo 06/ 2019   Peripheral vascular disease ([place])    managed by vascular-- dr Donzetta Matters   PONV (postoperative nausea and vomiting)    S/P CABG x 5 06-12-2000   by dr Lucianne Lei tright _0    Severe obstructive sleep apnea-hypopnea syndrome 02/25/2021   Stroke ([place]) 2002   mild memory loss    PAST SURGICAL HISTORY: Past Surgical History:  Procedure Laterality Date   ABDOMINAL AORTOGRAM W/LOWER EXTREMITY N/A 06/13/2017   Procedure: ABDOMINAL AORTOGRAM W/LOWER EXTREMITY;  Surgeon: Waynetta Sandy, MD;  Location: [place] [facility];  Service: Cardiovascular;  Laterality: N/A;  Bilateral   ABDOMINAL HYSTERECTOMY  1998   BREAST LUMPECTOMY WITH AXILLARY LYMPH NODE DISSECTION Left 1999   CARDIAC CATHETERIZATION  04-24-2000  dr Caryl Comes   3V CAD , subtotal PDA w/ slow flow colleterals , ef 48%   CARDIAC CATHETERIZATION  06/11/2000   3V CAD with progression of LAD disease   CAROTID ENDARTERECTOMY Left 04-27-2000   dr Amedeo Plenty  _1    COLONOSCOPY  06/24/2019   [facility]   CORONARY ARTERY BYPASS GRAFT  06-12-2000   dr Lucianne Lei tright _2    x 5 (left internal mammary artery to left anterior  descending coronary artery   LEFT HEART CATH AND CORS/GRAFTS ANGIOGRAPHY N/A 02/21/2018   Procedure: LEFT HEART CATH AND CORS/GRAFTS ANGIOGRAPHY;  Surgeon: Lorretta Harp, MD;  Location: [place] [facility];  Service: Cardiovascular;  Laterality: N/A;   PERIPHERAL VASCULAR INTERVENTION  06/13/2017   Procedure: PERIPHERAL VASCULAR  INTERVENTION;  Surgeon: Waynetta Sandy, MD;  Location: [place] [facility];  Service: Cardiovascular;;  Bilateral Iliac Stents   ROBOTIC ASSISTED SALPINGO OOPHERECTOMY Bilateral 03/13/2019   Procedure: XI ROBOTIC ASSISTED BILATERAL  SALPINGO OOPHORECTOMY;  Surgeon: Everitt Amber, MD;  Location: [place];  Service: Gynecology;  Laterality: Bilateral;   TOTAL MASTECTOMY Bilateral 11/07/2018   Procedure: BILATERAL MASTECTOMIES;  Surgeon: Stark Klein, MD;  Location: [place];  Service: General;  Laterality: Bilateral;    FAMILY HISTORY: Family History  Problem Relation Age of Onset   Lung cancer Mother 5       died at an early age   Other Other        There is no Hx of premature coronary artery disease in her family   Colon cancer Neg Hx    Rectal cancer Neg Hx    Stomach cancer Neg Hx   Brenda [name] has no information on her father.  The patient's mother died when she was 73 years old and the patient was put in foster care and partly raised by great aunts. The patient  has 1 sister, but she does not know her sister's medical history.   GYNECOLOGIC HISTORY:  No LMP recorded (lmp unknown). Patient has had a hysterectomy. Menarche age 46, first live birth age 28, the patient is GX P1.  She had hysterectomy around age 30.  She did not receive hormone replacement.  She did not have bilateral salpingo-oophorectomy   SOCIAL HISTORY: (updated 10/25/2018) Brenda [name] grew up in a foster home given that her mother died so young.  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  Daughter Brenda [name], age 64, works for the [employer] but currently is under [employer] because of an injury inflicted by a Ship broker.  The patient has 2 granddaughters, age 3 and 7 as of January 2019.  At home is just the patient. Brenda [name]'s sister lives outside of [place], [place]: Not in place   HEALTH MAINTENANCE: Social History   Tobacco Use   Smoking status: Former    Years: 17.00    Types: Cigarettes    Quit date: 09/11/2001    Years since quitting: 20.7   Smokeless tobacco: Never  Vaping Use   Vaping Use: Never used  Substance Use Topics   Alcohol use: Not Currently    Comment: Quit 1998   Drug use: No    Colonoscopy: January 2012  PAP: Status post hysterectomy  Bone density: May 2012 at [facility], T score -0.5   Allergies  Allergen Reactions   Adhesive [Tape] Itching   Latex Rash    Current Outpatient Medications  Medication Sig Dispense Refill   amLODipine (NORVASC) 10 MG tablet Take 1 tablet (10 mg total) by mouth daily. 90 tablet 3   aspirin EC 81 MG tablet Take 1 tablet (81 mg total) by mouth daily. Swallow whole. 90 tablet 3   carboxymethylcellulose (REFRESH PLUS) 0.5 % SOLN Apply to eye.     ezetimibe (ZETIA) 10 MG tablet Take 1 tablet (10 mg total) by mouth daily. 90 tablet 3   FIBER ADULT GUMMIES PO Take by mouth.     gabapentin (NEURONTIN) 300 MG capsule Take 1 capsule (300 mg total) by mouth 3 (three) times daily as needed (nerve pain). 90 capsule 3   metoprolol succinate (TOPROL-XL) 100 MG 24 hr tablet TAKE 1 TABLET BY MOUTH ONCE DAILY OR  IMMEDIATELY  FOLLOWING  A  MEAL 90 tablet 0   nitroGLYCERIN (NITROSTAT) 0.4 MG SL tablet Place 1 tablet (0.4 mg total) under the tongue every 5 (five) minutes as needed for chest pain. 1 tablet under tongue every 5 minutes as needed for chest discomfort (max 2 Tab/day) 25 tablet 4   Potassium Chloride ER 20 MEQ TBCR TAKE 1 TABLET BY MOUTH ON MONDAY,WEDNESDAYS,FRIDAYS AND SUNDAYS 45 tablet 3   predniSONE (DELTASONE) 50 MG tablet Take 1 pill daily for 5 days (Patient not taking: Reported on 05/26/2022) 5 tablet 0   rosuvastatin (CRESTOR) 40 MG tablet Take 1 tablet by mouth once daily 90 tablet 3   triamcinolone cream (KENALOG) 0.1 % Apply 1 application. topically 2 (two) times  daily. (Patient not taking: Reported on 05/26/2022) 100 g 0   Vitamin D, Ergocalciferol, 50000 units CAPS Take 1 capsule by mouth 4 (four) times a week.     No current facility-administered medications for this visit.     OBJECTIVE: African-American woman in no acute distress  There were no vitals filed for this visit.     There is no height or weight on file to calculate BMI.   Wt Readings from Last 3 Encounters:  05/26/22 150 lb (68 kg)  04/26/22 151 lb 12.8 oz (68.9 kg)  04/24/22 153 lb (69.4 kg)     ECOG FS:1 - Symptomatic but completely ambulatory  PE not done, telephone visit.  LAB RESULTS:  CMP     Component Value Date/Time   NA 140 08/23/2021 1501   NA 141 01/31/2021 1532   K 3.7 08/23/2021 1501   CL 107 08/23/2021 1501   CO2 25 08/23/2021 1501   GLUCOSE 88 08/23/2021 1501   BUN 18 08/23/2021 1501   BUN 13 01/31/2021 1532   CREATININE 0.94 08/23/2021 1501   CREATININE 1.14 (H) 10/08/2018 1109   CREATININE 1.04 (H) 01/31/2017 0915   CALCIUM 9.2 08/23/2021 1501   PROT 7.3 04/25/2022 0918   ALBUMIN 4.3 04/25/2022 0918   AST 25 04/25/2022 0918   AST 22 10/08/2018 1109   ALT 16 04/25/2022 0918   ALT 22 10/08/2018 1109   ALKPHOS 72 04/25/2022 0918   BILITOT 0.9 04/25/2022 0918   BILITOT 0.9 10/08/2018 1109   GFRNONAA >60 08/23/2021 1501   GFRNONAA 51 (L) 10/08/2018 1109   GFRAA 68 09/04/2019 0827   GFRAA 59 (L) 10/08/2018 1109    Lab Results  Component Value Date   WBC 7.2 08/23/2021   NEUTROABS 3.7 08/23/2021   HGB 13.6 08/23/2021   HCT 40.4 08/23/2021   MCV 95.1 08/23/2021   PLT 153 08/23/2021   No results found for: "LABCA2"  No components found for: "BMWUXL244"  No results for input(s): "INR" in the last 168 hours.  No results found for: "LABCA2"  No results found for: "WNU272"  No results found for: "CAN125"  No results found for: "CAN153"  No results found for: "CA2729"  No components found for: "HGQUANT"  No results found for:  "CEA1", "CEA" / No results found for: "CEA1", "CEA"   No results found for: "AFPTUMOR"  No results found for: "CHROMOGRNA"  No results found for: "TOTALPROTELP", "ALBUMINELP", "A1GS", "A2GS", "BETS", "BETA2SER", "GAMS", "MSPIKE", "SPEI" (this displays SPEP labs)  No results found for: "KPAFRELGTCHN", "LAMBDASER", "KAPLAMBRATIO" (kappa/lambda light chains)  No results found for: "HGBA", "HGBA2QUANT", "HGBFQUANT", "HGBSQUAN" (Hemoglobinopathy evaluation)   Lab Results  Component Value Date   LDH 155 07/03/2019    Lab Results  Component Value Date   IRON 75 07/03/2019   IRONPCTSAT 24.0 07/03/2019   (Iron and TIBC)  Lab Results  Component Value Date   FERRITIN 128.8 07/03/2019    Urinalysis    Component Value Date/Time   COLORURINE YELLOW 08/05/2019 1530   APPEARANCEUR CLOUDY (A) 08/05/2019 1530   LABSPEC 1.008 08/05/2019 1530   PHURINE 5.0 08/05/2019 1530   GLUCOSEU NEGATIVE 08/05/2019 1530   HGBUR NEGATIVE 08/05/2019 1530   BILIRUBINUR NEGATIVE 08/05/2019 1530   KETONESUR NEGATIVE 08/05/2019 1530   PROTEINUR NEGATIVE 08/05/2019 1530   NITRITE NEGATIVE 08/05/2019 1530   LEUKOCYTESUR SMALL (A) 08/05/2019 1530    STUDIES:  [place] LT BREAST BX W LOC DEV 1ST LESION IMG BX SPEC US GUIDE  Addendum Date: 05/22/2022   ADDENDUM REPORT: 05/22/2022 13:10 ADDENDUM: Pathology revealed FAT NECROSIS, RESOLVING of the LEFT mastectomy bed at about 1 o'clock, 2cmfn, (ribbon clip). This was found to be concordant by Dr. Ammie Ferrier. Pathology results were discussed with the patient by telephone. The patient reported doing well after  the biopsy with tenderness at the site. Post biopsy instructions and care were reviewed and questions were answered. The patient was encouraged to call [facility] for any additional concerns. My direct phone number was provided. The patient was instructed to continue with monthly self breast examinations and to have clinical  follow-up as needed, as the patient is status-post BILATERAL mastectomies. Pathology results reported by Terie Purser, RN on 05/22/2022. Electronically Signed   By: Ammie Ferrier M.D.   On: 05/22/2022 13:10   Result Date: 05/22/2022 CLINICAL DATA:  66 year old female presenting for ultrasound-guided biopsy of a mass at her left breast mastectomy site. EXAM: ULTRASOUND GUIDED LEFT BREAST CORE NEEDLE BIOPSY COMPARISON:  Previous exam(s). PROCEDURE: I met with the patient and we discussed the procedure of ultrasound-guided biopsy, including benefits and alternatives. We discussed the high likelihood of a successful procedure. We discussed the risks of the procedure, including infection, bleeding, tissue injury, clip migration, and inadequate sampling. Informed written consent was given. The usual time-out protocol was performed immediately prior to the procedure. Lesion quadrant: Upper outer quadrant Using sterile technique and 1% Lidocaine as local anesthetic, under direct ultrasound visualization, a 14 gauge spring-loaded device was used to perform biopsy of a mass in the left breast mastectomy site at approximately 1 o'clock, 2 cm from the nipple using a lateral approach. At the conclusion of the procedure a ribbon shaped tissue marker clip was deployed into the biopsy cavity. Follow up 2 view mammogram was performed and dictated separately. IMPRESSION: Ultrasound guided biopsy of a left breast mass in the mastectomy site at 1 o'clock. No apparent complications. Electronically Signed: By: Ammie Ferrier M.D. On: 05/18/2022 11:25  US BREAST LTD UNI LEFT INC AXILLA  Result Date: 05/12/2022 CLINICAL DATA:  66 year old female presenting for evaluation of a new lump along the left chest wall. Patient has a history left breast cancer in 2019 status post bilateral mastectomy. EXAM: ULTRASOUND OF THE LEFT BREAST COMPARISON:  Previous exam(s). FINDINGS: On physical exam, I feel a discrete mass along the lateral  left breast/chest wall. Targeted ultrasound is performed in the left breast at 1 o'clock 2 cm from the nipple at the palpable site demonstrating a superficial oval circumscribed isoechoic mass measuring 1.3 x 0.6 x 1.2 cm. No internal vascularity. Targeted ultrasound of the left axilla demonstrates normal lymph nodes. IMPRESSION: Indeterminate mass in the left breast at 1 o'clock measuring 1.3 cm, possibly representing fat necrosis. RECOMMENDATION: Ultrasound-guided core needle biopsy x1 of the left breast. I have discussed the findings and recommendations with the patient who agrees to proceed with biopsy. The patient will be scheduled for the biopsy appointment prior to leaving the office today. BI-RADS CATEGORY  4: Suspicious. Electronically Signed   By: Audie Pinto M.D.   On: 05/12/2022 12:36    ELIGIBLE FOR AVAILABLE RESEARCH PROTOCOL: no   ASSESSMENT: 66 y.o. PALB2 positive [place] woman status post left breast biopsy 08/19/2018 for ductal carcinoma in situ, grade 2, estrogen and progesterone receptor positive  (1) left lumpectomy 1999 for an (estrogen receptor negative?) stage I-II invasive breast cancer  (a) s/p chemotherapy  (b) s/p radiation  (2) genetics testing 10/14/2018 through the Common Hereditary Cancer Panel + EGFR was POSITIVE for a pathogenic variant in PALB2 c.3113G>A (p.[id number]*).  (a) no additional mutations were noted in APC, ATM, AXIN2, BARD1, BMPR1A, BRCA1, BRCA2, BRIP1, BUB1B, CDH1, CDK4, CDKN2A, CHEK2, CTNNA1, DICER1, ENG, EGFR, EPCAM, GALNT12, GREM1, HOXB13, KIT, MEN1, MLH1, MLH3, MSH2, MSH3,  MSH6, MUTYH, NBN, NF1, NTHL1, PALB2, PDGFRA, PMS2, POLD1, POLE, PTEN, RAD50, RAD51C, RAD51D, RNF43, RPS20, SDHA, SDHB, SDHC, SDHD, SMAD4, SMARCA4, STK11, TP53, TSC1, TSC2, VHL.  (3) s/p bilateral mastectomies 11/07/2018 showing  (a) on the right, no evidence of carcinoma  (b) on the left, no evidence of residual ductal carcinoma in situ  (4) ovarian cancer risk: BSO  recommended given lack of family history information  (a) status post remote hysterectomy  (b) bilateral salpingo-oophorectomy 03/31/2019, with benign pathology.  (5) pancreatic cancer risk:  (a) MRI of the abdomen with and without contrast June 29, 2017 documented 0.8 cm cyst in the tail of the pancreas appearing to communicate with the main duct.    (b) MRCP 07/04/2018 shows the known cyst in the tail of the pancreas to measure 0.7 cm   (c) CT of the abdomen and pelvis November 2020 showed no evidence of ductal dilatation   PLAN:   She is here for a telephone visit.  We have reviewed pathology results which shows no evidence of malignancy, fat necrosis noted.  She tells me that she has seen Dr. Oleta Mouse plans for plastic surgery but is not satisfied with the visit and at this time she does not want to proceed with any other appointments for plastic surgery.  We have encouraged self breast exam.  She will return to clinic in 1 year or sooner as needed.  Total time spent, 12 min  I connected with  Brenda [name] on 06/02/22 by a telephone application and verified that I am speaking with the correct person using two identifiers.   I discussed the limitations of evaluation and management by telemedicine. The patient expressed understanding and agreed to proceed.   *Total Encounter Time as defined by the Centers for Medicare and Medicaid Services includes, in addition to the face-to-face time of a patient visit (documented in the note above) non-face-to-face time: obtaining and reviewing outside history, ordering and reviewing medications, tests or procedures, care coordination (communications with other health care professionals or caregivers) and documentation in the medical record.

## 2022-06-08 ENCOUNTER — Ambulatory Visit (INDEPENDENT_AMBULATORY_CARE_PROVIDER_SITE_OTHER): Payer: Medicare Other | Admitting: Internal Medicine

## 2022-06-08 ENCOUNTER — Encounter: Payer: Self-pay | Admitting: Internal Medicine

## 2022-06-08 VITALS — BP 110/82 | HR 60 | Temp 98.5°F | Ht 64.0 in | Wt 151.0 lb

## 2022-06-08 DIAGNOSIS — E7800 Pure hypercholesterolemia, unspecified: Secondary | ICD-10-CM | POA: Diagnosis not present

## 2022-06-08 DIAGNOSIS — Z8673 Personal history of transient ischemic attack (TIA), and cerebral infarction without residual deficits: Secondary | ICD-10-CM

## 2022-06-08 DIAGNOSIS — Z0001 Encounter for general adult medical examination with abnormal findings: Secondary | ICD-10-CM

## 2022-06-08 DIAGNOSIS — Z23 Encounter for immunization: Secondary | ICD-10-CM

## 2022-06-08 DIAGNOSIS — I10 Essential (primary) hypertension: Secondary | ICD-10-CM

## 2022-06-08 DIAGNOSIS — I739 Peripheral vascular disease, unspecified: Secondary | ICD-10-CM

## 2022-06-08 NOTE — Progress Notes (Signed)
   Subjective:   Patient ID: Brenda [name], female    DOB: Aug 11, 1956, 66 y.o.   MRN: [id number]  HPI The patient is here for physical.  PMH, [facility], social history reviewed and updated  Review of Systems  Constitutional: Negative.   HENT: Negative.    Eyes: Negative.   Respiratory:  Negative for cough, chest tightness and shortness of breath.   Cardiovascular:  Negative for chest pain, palpitations and leg swelling.  Gastrointestinal:  Negative for abdominal distention, abdominal pain, constipation, diarrhea, nausea and vomiting.  Musculoskeletal: Negative.   Skin: Negative.   Neurological: Negative.   Psychiatric/Behavioral: Negative.      Objective:  Physical Exam Constitutional:      Appearance: She is well-developed.  HENT:     Head: Normocephalic and atraumatic.  Cardiovascular:     Rate and Rhythm: Normal rate and regular rhythm.  Pulmonary:     Effort: Pulmonary effort is normal. No respiratory distress.     Breath sounds: Normal breath sounds. No wheezing or rales.  Abdominal:     General: Bowel sounds are normal. There is no distension.     Palpations: Abdomen is soft.     Tenderness: There is no abdominal tenderness. There is no rebound.  Musculoskeletal:     Cervical back: Normal range of motion.  Skin:    General: Skin is warm and dry.  Neurological:     Mental Status: She is alert and oriented to person, place, and time.     Coordination: Coordination normal.     Vitals:   06/08/22 0810  BP: 110/82  Pulse: 60  Temp: 98.5 F (36.9 C)  SpO2: 97%  Weight: 151 lb (68.5 kg)  Height: '5\' 4"'$  (1.626 m)    Assessment & Plan:  Flu shot given at visit

## 2022-06-08 NOTE — Assessment & Plan Note (Signed)
Taking statin and aspirin. No new symptoms.

## 2022-06-08 NOTE — Assessment & Plan Note (Signed)
Taking aspirin daily, BP at goal. No DM. No new symptoms continue monitoring.

## 2022-06-08 NOTE — Assessment & Plan Note (Signed)
BP at goal on amlodipine 10 mg daily and metoprolol 100 mg daily. Recent labs at goal none needed today.

## 2022-06-08 NOTE — Assessment & Plan Note (Signed)
Flu shot given. Covid-19 counseled. Pneumonia complete. Shingrix complete per patient. Tetanus up to date. Colonoscopy up to date. Mammogram up to date, pap smear aged out and dexa complete. Counseled about sun safety and mole surveillance. Counseled about the dangers of distracted driving. Given 10 year screening recommendations.

## 2022-06-08 NOTE — Assessment & Plan Note (Signed)
Taking crestor 40 mg daily and recent labs today close to goal dose was changed cardiology will recheck.

## 2022-06-09 ENCOUNTER — Ambulatory Visit (INDEPENDENT_AMBULATORY_CARE_PROVIDER_SITE_OTHER): Payer: Medicare Other | Admitting: Orthopedic Surgery

## 2022-06-09 DIAGNOSIS — G8929 Other chronic pain: Secondary | ICD-10-CM | POA: Diagnosis not present

## 2022-06-09 DIAGNOSIS — M5442 Lumbago with sciatica, left side: Secondary | ICD-10-CM | POA: Diagnosis not present

## 2022-06-10 ENCOUNTER — Encounter: Payer: Self-pay | Admitting: Orthopedic Surgery

## 2022-06-10 NOTE — Progress Notes (Signed)
Office Visit Note   Patient: Brenda [name]           Date of Birth: 1956-05-02           MRN: [id number] Visit Date: 06/09/2022 Requested by: [name], MD [address] [place],  [place] [address] PCP: [name], MD  Subjective: Chief Complaint  Patient presents with   Left Leg - Pain    HPI: Brenda [name] is a 66 y.o. female who presents to the office complaining of left hip pain and buttock pain as well as low back pain.  Has had 2 months of symptoms.  Denies much in the way of groin pain.  She has been advised that she has a pinched nerve.  She took a 5-day Medrol Dosepak without relief.  The pain radiates down to the left calf and sometimes the foot.  She does like to exercise including stationary bike walking yoga and body pump.  She does report some occasional low back pain as well as stiffness with prolonged sitting.  She has some start up pain in her buttock.  She likes to exercise all the time.  She did have stents placed in both legs 5 years ago.  By her report the ultrasound examination of the blood flow was intact.  She is retired.  She has a history of breast cancer about 3 years ago.  Has been seen in the past for right-sided sciatica..                ROS: All systems reviewed are negative as they relate to the chief complaint within the history of present illness.  Patient denies fevers or chills.  Assessment & Plan: Visit Diagnoses:  1. Chronic left-sided low back pain with left-sided sciatica     Plan: Impression is low back pain with good checkup from vascular surgeons by her report.  I think she had lumbar spine in 2021 which is reviewed and showed facet arthropathy at L4-5.  I think that may have changed.  Having a lot of radicular symptoms currently.  Plan at this time is MRI lumbar spine to evaluate left-sided radiculopathy.  Follow-up after that study.  May be a candidate for epidural steroid injections.  No red flag symptoms  today.  Follow-Up Instructions: No follow-ups on file.   Orders:  No orders of the defined types were placed in this encounter.  No orders of the defined types were placed in this encounter.     Procedures: No procedures performed   Clinical Data: No additional findings.  Objective: Vital Signs: LMP  (LMP Unknown)   Physical Exam:  Constitutional: Patient appears well-developed HEENT:  Head: Normocephalic Eyes:EOM are normal Neck: Normal range of motion Cardiovascular: Normal rate Pulmonary/chest: Effort normal Neurologic: Patient is alert Skin: Skin is warm Psychiatric: Patient has normal mood and affect  Ortho Exam: Ortho exam demonstrates normal gait alignment.  She has negative clonus.  Reflexes symmetric.  She has warm perfused feet.  No groin pain with internal/external Tatian of the leg.  No definite paresthesias L1-S1 bilaterally.  Has no muscle atrophy.  Does have some mild pain with forward lateral bending but no discrete trochanteric tenderness.  Specialty Comments:  No specialty comments available.  Imaging: No results found.   PMFS History: Patient Active Problem List   Diagnosis Date Noted   Vision loss 04/08/2021   Optic neuritis, retrobulbar (acute), left 03/29/2021   Severe obstructive sleep apnea-hypopnea syndrome 02/25/2021  PAD (peripheral artery disease) ([place]) 01/31/2021   Memory deficit following unspecified cerebrovascular disease 01/31/2021   Snoring 01/31/2021   Excessive daytime sleepiness 01/31/2021   Cognitive deficit with impaired visuospatial function 01/31/2021   Pancreatic insufficiency 05/30/2019   Encounter for general adult medical examination with abnormal findings 14/23/9532   Monoallelic mutation of PALB2 gene 10/25/2018   Genetic testing 10/25/2018   Malignant neoplasm of central portion of left breast in female, estrogen receptor negative ([place]) 10/08/2018   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/08/2018    Hypothyroidism 03/02/2018   Lesion of pancreas 06/22/2017   Constipation 06/22/2017   Allergic rhinitis 02/25/2016   Low back pain 09/29/2015   Carpal tunnel syndrome, right 08/20/2015   Hx of completed stroke 05/21/2015   Hyperthyroidism 12/29/2008   HYPERCHOLESTEROLEMIA 12/29/2008   Anxiety 12/29/2008   Essential hypertension 12/29/2008   Coronary atherosclerosis 12/29/2008   CAROTID ARTERY STENOSIS 12/29/2008   Past Medical History:  Diagnosis Date   Alcoholism ([place])    Quit 1998   Allergy    Anemia    Anxiety    Breast cancer, left [facility]) oncologist-- dr Jana Hakim   dx 1999,  DCIS;  s/p  left lumpectomy w/ node dissection's;  completed chemo and radiation 2000:  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, Grade 2, Stage 0 (ER/PR +);  11-07-2018  s/p  bilateral mastectomies   Carotid artery stenosis    bilateral---  04-27-2000  s/p  left carotid endarterectomy:  last doppler in epic 08-13-2018  right ICA 40-59%,  right ECA >50%,  left ICA 1-39% ,  bilateral subclavians stenotic   CHF (congestive heart failure) ([place])    Chronic stable angina ([place])    followed by cardiology   Complication of anesthesia    PONV   Coronary artery disease cardiologist-- dr Stanford Breed   06-12-2000  s/p  CABG x5  :  cardiac cath 02-21-2018 --  normal LVF, 80% LM, occluded RCA, 95% Ramus and 100% OM,  Patent seqSVG to  PLA-PDA & LIMA to LAD,  seqSVG to OM and RI occluded   Depression    Family history of lung cancer    History of blood transfusion    History of CVA with residual deficit    short term memory loss per pt   History of Helicobacter pylori infection 2001   Hyperlipidemia    Hypertension    Hyperthyroidism    dx yrs ago, per pt have taken medication for awhile   Mild mitral regurgitation    per echo 06/ 2019   Peripheral vascular disease ([place])    managed by vascular-- dr Donzetta Matters   PONV (postoperative nausea and vomiting)    S/P CABG x 5 06-12-2000   by dr Lucianne Lei tright @[facility]    Severe  obstructive sleep apnea-hypopnea syndrome 02/25/2021   Stroke ([place]) 2002   mild memory loss    Family History  Problem Relation Age of Onset   Lung cancer Mother 97       died at an early age   Other Other        There is no Hx of premature coronary artery disease in her family   Colon cancer Neg Hx    Rectal cancer Neg Hx    Stomach cancer Neg Hx     Past Surgical History:  Procedure Laterality Date   ABDOMINAL AORTOGRAM W/LOWER EXTREMITY N/A 06/13/2017   Procedure: ABDOMINAL AORTOGRAM W/LOWER EXTREMITY;  Surgeon: Waynetta Sandy, MD;  Location: [facility]  INVASIVE CV LAB;  Service: Cardiovascular;  Laterality: N/A;  Bilateral   ABDOMINAL HYSTERECTOMY  1998   BREAST LUMPECTOMY WITH AXILLARY LYMPH NODE DISSECTION Left 1999   CARDIAC CATHETERIZATION  04-24-2000  dr Caryl Comes   3V CAD , subtotal PDA w/ slow flow colleterals , ef 48%   CARDIAC CATHETERIZATION  06/11/2000   3V CAD with progression of LAD disease   CAROTID ENDARTERECTOMY Left 04-27-2000   dr Amedeo Plenty  @[facility]    COLONOSCOPY  06/24/2019   [facility]   CORONARY ARTERY BYPASS GRAFT  06-12-2000   dr Lucianne Lei tright @[facility]    x 5 (left internal mammary artery to left anterior  descending coronary artery   LEFT HEART CATH AND CORS/GRAFTS ANGIOGRAPHY N/A 02/21/2018   Procedure: LEFT HEART CATH AND CORS/GRAFTS ANGIOGRAPHY;  Surgeon: Lorretta Harp, MD;  Location: [place] CV LAB;  Service: Cardiovascular;  Laterality: N/A;   PERIPHERAL VASCULAR INTERVENTION  06/13/2017   Procedure: PERIPHERAL VASCULAR INTERVENTION;  Surgeon: Waynetta Sandy, MD;  Location: [place] CV LAB;  Service: Cardiovascular;;  Bilateral Iliac Stents   ROBOTIC ASSISTED SALPINGO OOPHERECTOMY Bilateral 03/13/2019   Procedure: XI ROBOTIC ASSISTED BILATERAL  SALPINGO OOPHORECTOMY;  Surgeon: Everitt Amber, MD;  Location: [place];  Service: Gynecology;  Laterality: Bilateral;   TOTAL MASTECTOMY Bilateral 11/07/2018   Procedure: BILATERAL MASTECTOMIES;   Surgeon: Stark Klein, MD;  Location: [place];  Service: General;  Laterality: Bilateral;   Social History   Occupational History   Occupation: Education officer, museum, 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

## 2022-06-12 ENCOUNTER — Telehealth: Payer: Self-pay

## 2022-06-12 DIAGNOSIS — E2839 Other primary ovarian failure: Secondary | ICD-10-CM

## 2022-06-12 NOTE — Telephone Encounter (Signed)
Pt verified DEXA was done in 2012 at the breast Imaging, insurance stated --DEXA is overdue and requesting for order.

## 2022-06-12 NOTE — Telephone Encounter (Signed)
Pt is requesting an order for a Dexa scan. Pt states that her insurance says she is overdue for one.  Please advise

## 2022-06-12 NOTE — Telephone Encounter (Signed)
Please advise 

## 2022-06-12 NOTE — Telephone Encounter (Signed)
When was her last DEXA? We have one from 2012 which was normal.

## 2022-06-14 ENCOUNTER — Ambulatory Visit (INDEPENDENT_AMBULATORY_CARE_PROVIDER_SITE_OTHER)
Admission: RE | Admit: 2022-06-14 | Discharge: 2022-06-14 | Disposition: A | Discharge status: Home / Self Care | Payer: Medicare Other | Source: Ambulatory Visit | Attending: Internal Medicine | Admitting: Internal Medicine

## 2022-06-14 DIAGNOSIS — E2839 Other primary ovarian failure: Secondary | ICD-10-CM | POA: Diagnosis not present

## 2022-06-19 ENCOUNTER — Encounter: Payer: Self-pay | Admitting: Gastroenterology

## 2022-06-19 ENCOUNTER — Ambulatory Visit (INDEPENDENT_AMBULATORY_CARE_PROVIDER_SITE_OTHER): Payer: Medicare Other | Admitting: Gastroenterology

## 2022-06-19 VITALS — BP 96/64 | HR 60 | Ht 64.0 in | Wt 152.4 lb

## 2022-06-19 DIAGNOSIS — K862 Cyst of pancreas: Secondary | ICD-10-CM | POA: Diagnosis not present

## 2022-06-19 DIAGNOSIS — K860 Alcohol-induced chronic pancreatitis: Secondary | ICD-10-CM | POA: Diagnosis not present

## 2022-06-19 NOTE — Progress Notes (Signed)
[facility] Consult Note:  History: Brenda [name] 06/19/2022  Referring provider: [name], MD  Reason for consult/chief complaint: Referral (Referral from oncologist pt states there was a spot seen on pancreas. No complaints at this visit.)   Subjective  HPI: Brenda [name] was referred to see [place] by her oncologist for concern of GI imaging findings.   She was last seen in 2020 for chronic abdominal pain, bloating and alternating constipation with diarrhea.  Previous treatments with antispasmodics under the care of Dr. Collene Mares had reportedly not been helpful.  CT abdomen and pelvis showed changes of chronic pancreatitis with calcifications and a subcentimeter pseudocyst, so she was given a trial of Creon.  However, that caused significant GI side effects and she stopped it soon into the course.  When I last saw her she was improved on a trial of IBgard as well as a FODMAP type diet. Colonoscopy to the TI normal except for some mild nodular changes in the TI, biopsies of that as well as colonic mucosa normal (see below). EGD with shallow ulcerative changes in the duodenum and patchy mucosal atrophy felt secondary to aspirin use.  Bx without H pylori or typical celiac changes. ________________________________ 06/02/22 Oncology telemedicine note reviewed - mentions "pancreatic cancer risk" with reference to 2018-2020 imaging findings related to pancreas (CTs and MRI) - MRI/MRCP 09/289, CTAP 11 2020  Brenda [name] says she no longer has abdominal pain, her bowel habits are regular and she denies rectal bleeding.  No nausea vomiting, appetite good weight stable.  Her main question was about the previous pancreatic findings on imaging and possible need for follow-up imaging.  Brenda [name] has multiple medical issues as noted below, notably coronary and peripheral arterial disease as well as more recent breast DCIS.  As noted in previous GI encounters, she has extensive atherosclerotic  disease but no SMA stenosis.  ROS:  Review of Systems   Past Medical History: Past Medical History:  Diagnosis Date   Alcoholism ([place])    Quit 1998   Allergy    Anemia    Anxiety    Breast cancer, left [facility]) oncologist-- dr Jana Hakim   dx 1999,  DCIS;  s/p  left lumpectomy w/ node dissection's;  completed chemo and radiation 2000:  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, Grade 2, Stage 0 (ER/PR +);  11-07-2018  s/p  bilateral mastectomies   Carotid artery stenosis    bilateral---  04-27-2000  s/p  left carotid endarterectomy:  last doppler in epic 08-13-2018  right ICA 40-59%,  right ECA >50%,  left ICA 1-39% ,  bilateral subclavians stenotic   CHF (congestive heart failure) ([place])    Chronic stable angina    followed by cardiology   Complication of anesthesia    PONV   Coronary artery disease cardiologist-- dr Stanford Breed   06-12-2000  s/p  CABG x5  :  cardiac cath 02-21-2018 --  normal LVF, 80% LM, occluded RCA, 95% Ramus and 100% OM,  Patent seqSVG to  PLA-PDA & LIMA to LAD,  seqSVG to OM and RI occluded   Depression    Family history of lung cancer    History of blood transfusion    History of CVA with residual deficit    short term memory loss per pt   History of Helicobacter pylori infection 2001   Hyperlipidemia    Hypertension    Hyperthyroidism    dx yrs ago, per pt have taken medication for awhile   Mild mitral  regurgitation    per echo 06/ 2019   PAD (peripheral artery disease) ([place])    Peripheral vascular disease ([place])    managed by vascular-- dr Donzetta Matters   PONV (postoperative nausea and vomiting)    S/P CABG x 5 06-12-2000   by dr Lucianne Lei tright '@[facility]    Severe obstructive sleep apnea-hypopnea syndrome 02/25/2021   Stroke ([place]) 2002   mild memory loss     Past Surgical History: Past Surgical History:  Procedure Laterality Date   ABDOMINAL AORTOGRAM W/LOWER EXTREMITY N/A 06/13/2017   Procedure: ABDOMINAL AORTOGRAM W/LOWER EXTREMITY;  Surgeon: Waynetta Sandy, MD;  Location: [place] CV LAB;  Service: Cardiovascular;  Laterality: N/A;  Bilateral   ABDOMINAL HYSTERECTOMY  1998   BREAST LUMPECTOMY WITH AXILLARY LYMPH NODE DISSECTION Left 1999   CARDIAC CATHETERIZATION  04-24-2000  dr Caryl Comes   3V CAD , subtotal PDA w/ slow flow colleterals , ef 48%   CARDIAC CATHETERIZATION  06/11/2000   3V CAD with progression of LAD disease   CAROTID ENDARTERECTOMY Left 04-27-2000   dr Amedeo Plenty  '@[facility]    COLONOSCOPY  06/24/2019   [facility]   CORONARY ARTERY BYPASS GRAFT  06-12-2000   dr Lucianne Lei tright '@[facility]    x 5 (left internal mammary artery to left anterior  descending coronary artery   LEFT HEART CATH AND CORS/GRAFTS ANGIOGRAPHY N/A 02/21/2018   Procedure: LEFT HEART CATH AND CORS/GRAFTS ANGIOGRAPHY;  Surgeon: Lorretta Harp, MD;  Location: [place] CV LAB;  Service: Cardiovascular;  Laterality: N/A;   PERIPHERAL VASCULAR INTERVENTION  06/13/2017   Procedure: PERIPHERAL VASCULAR INTERVENTION;  Surgeon: Waynetta Sandy, MD;  Location: [place] CV LAB;  Service: Cardiovascular;;  Bilateral Iliac Stents   ROBOTIC ASSISTED SALPINGO OOPHERECTOMY Bilateral 03/13/2019   Procedure: XI ROBOTIC ASSISTED BILATERAL  SALPINGO OOPHORECTOMY;  Surgeon: Everitt Amber, MD;  Location: [place];  Service: Gynecology;  Laterality: Bilateral;   TOTAL MASTECTOMY Bilateral 11/07/2018   Procedure: BILATERAL MASTECTOMIES;  Surgeon: Stark Klein, MD;  Location: [place];  Service: General;  Laterality: Bilateral;     Family History: Family History  Problem Relation Age of Onset   Lung cancer Mother 67       died at an early age   Other Other        There is no Hx of premature coronary artery disease in her family   Colon cancer Neg Hx    Rectal cancer Neg Hx    Stomach cancer Neg Hx     Social History: Social History   Socioeconomic History   Marital status: Single    Spouse name: Not on file   Number of children: 1   Years  of education: Not on file   Highest education level: Not on file  Occupational History   Occupation: Education officer, museum, 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  Tobacco Use   Smoking status: Former    Years: 17.00    Types: Cigarettes    Quit date: 09/11/2001    Years since quitting: 20.7   Smokeless tobacco: Never  Vaping Use   Vaping Use: Never used  Substance and Sexual Activity   Alcohol use: Not Currently    Comment: Quit 1998   Drug use: No   Sexual activity: Not on file    Comment: Hysterectomy  Other Topics Concern   Not on file  Social History Narrative   Not on file   Social Determinants of Health   Financial  Resource Strain: Low Risk  (05/26/2022)   Overall Financial Resource Strain (CARDIA)    Difficulty of Paying Living Expenses: Not hard at all  Food Insecurity: No Food Insecurity (05/26/2022)   Hunger Vital Sign    Worried About Running Out of Food in the Last Year: Never true    Ran Out of Food in the Last Year: Never true  Transportation Needs: No Transportation Needs (05/26/2022)   PRAPARE - Hydrologist (Medical): No    Lack of Transportation (Non-Medical): No  Physical Activity: Sufficiently Active (05/26/2022)   Exercise Vital Sign    Days of Exercise per Week: 3 days    Minutes of Exercise per Session: 60 min  Stress: No Stress Concern Present (05/26/2022)   [place]    Feeling of Stress : Not at all  Social Connections: Moderately Integrated (05/26/2022)   Social Connection and Isolation Panel [NHANES]    Frequency of Communication with Friends and Family: More than three times a week    Frequency of Social Gatherings with Friends and Family: More than three times a week    Attends Religious Services: More than 4 times per year    Active Member of [employer] or Organizations: Yes    Attends Music therapist: More than 4 times per year    Marital  Status: Never married   Brenda [name] shared with me her struggles with alcohol dependency many years ago until she became sober.  Allergies: Allergies  Allergen Reactions   Adhesive [Tape] Itching   Latex Rash    Outpatient Meds: Current Outpatient Medications  Medication Sig Dispense Refill   amLODipine (NORVASC) 10 MG tablet Take 1 tablet (10 mg total) by mouth daily. 90 tablet 3   aspirin EC 81 MG tablet Take 1 tablet (81 mg total) by mouth daily. Swallow whole. 90 tablet 3   carboxymethylcellulose (REFRESH PLUS) 0.5 % SOLN Apply to eye.     ezetimibe (ZETIA) 10 MG tablet Take 1 tablet (10 mg total) by mouth daily. 90 tablet 3   FIBER ADULT GUMMIES PO Take by mouth.     gabapentin (NEURONTIN) 300 MG capsule Take 1 capsule (300 mg total) by mouth 3 (three) times daily as needed (nerve pain). 90 capsule 3   metoprolol succinate (TOPROL-XL) 100 MG 24 hr tablet TAKE 1 TABLET BY MOUTH ONCE DAILY OR  IMMEDIATELY  FOLLOWING  A  MEAL 90 tablet 0   nitroGLYCERIN (NITROSTAT) 0.4 MG SL tablet Place 1 tablet (0.4 mg total) under the tongue every 5 (five) minutes as needed for chest pain. 1 tablet under tongue every 5 minutes as needed for chest discomfort (max 2 Tab/day) 25 tablet 4   Potassium Chloride ER 20 MEQ TBCR TAKE 1 TABLET BY MOUTH ON MONDAY,WEDNESDAYS,FRIDAYS AND SUNDAYS 45 tablet 3   rosuvastatin (CRESTOR) 40 MG tablet Take 1 tablet by mouth once daily 90 tablet 3   triamcinolone cream (KENALOG) 0.1 % Apply 1 application. topically 2 (two) times daily. 100 g 0   Vitamin D, Ergocalciferol, 50000 units CAPS Take 1 capsule by mouth 4 (four) times a week.     No current facility-administered medications for this visit.      ___________________________________________________________________ Objective   Exam:  BP 96/64   Pulse 60   Ht '5\' 4"'$  (1.626 m)   Wt 152 lb 6 oz (69.1 kg)   LMP  (LMP Unknown)   BMI 26.16 kg/m  Wt Readings from Last 3 Encounters:  06/19/22 152 lb 6 oz (69.1 kg)   06/08/22 151 lb (68.5 kg)  05/26/22 150 lb (68 kg)    General: Well-appearing Eyes: sclera anicteric, no redness ENT: oral mucosa moist without lesions, no cervical or supraclavicular lymphadenopathy CV: Regular without murmur, no JVD, no peripheral edema Resp: clear to auscultation bilaterally, normal RR and effort noted GI: soft, no tenderness, with active bowel sounds. No guarding or palpable organomegaly noted. Skin; warm and dry, no rash or jaundice noted Neuro: awake, alert and oriented x 3. Normal gross motor function and fluent speech  Labs:  1. Surgical [P], terminal ileum (nodular mucosa) - SMALL BOWEL MUCOSA WITH LYMPHOID AGGREGATES - NO ACUTE INFLAMMATION, GRANULOMAS OR MALIGNANCY IDENTIFIED - SEE COMMENT 2. Surgical [P], random sites (right and left colon) - BENIGN COLONIC MUCOSA - NO ACTIVE INFLAMMATION OR EVIDENCE OF MICROSCOPIC COLITIS - NO HIGH GRADE DYSPLASIA OR MALIGNANCY IDENTIFIED 3. Surgical [P], duodenum (ulcer) - ACUTE NON-SPECIFIC DUODENITIS - SEE COMMENT 4. Surgical [P], gastric (random) - BENIGN GASTRIC MUCOSA - NO H. PYLORI, INTESTINAL METAPLASIA OR MALIGNANCY IDENTIFIED  Radiologic Studies:  CTs and MRI as noted above are on file.  Reports were reviewed today.  Assessment: Encounter Diagnoses  Name Primary?   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 Her recollection is that clearance of the H. pylori was the major thing that led to her feeling better.  Calcifications and pseudocyst findings on imaging from chronic pancreatitis due to previous alcohol abuse.  I agree that although these findings are likely benign, the cystic nature of that warrants radiographic follow-up.  Plan:  MRI pancreas with MRCP. If findings stable, future surveillance imaging may not be necessary.  Thank you for the courtesy of this consult.   Please call me with any questions or concerns.  Nelida Meuse III  CC: Referring provider noted above

## 2022-06-19 NOTE — Patient Instructions (Signed)
_______________________________________________________  If you are age 68 or older, your body mass index should be between 23-30. Your Body mass index is 26.16 kg/m. If this is out of the aforementioned range listed, please consider follow up with your Primary Care Provider.  If you are age 43 or younger, your body mass index should be between 19-25. Your Body mass index is 26.16 kg/m. If this is out of the aformentioned range listed, please consider follow up with your Primary Care Provider.   ________________________________________________________  The  GI providers would like to encourage you to use [facility] to communicate with providers for non-urgent requests or questions.  Due to long hold times on the telephone, sending your provider a message by [facility] may be a faster and more efficient way to get a response.  Please allow 48 business hours for a response.  Please remember that this is for non-urgent requests.  _______________________________________________________ Dennis Bast have been scheduled for an MRI at [facility] on 06-28-2022. Your appointment time is 3:00 pm. Please arrive to admitting (at main entrance of the hospital) 30 minutes prior to your appointment time for registration purposes. Please make certain not to have anything to eat or drink 6 hours prior to your test. In addition, if you have any metal in your body, have a pacemaker or defibrillator, please be sure to let your ordering physician know. This test typically takes 45 minutes to 1 hour to complete. Should you need to reschedule, please call [phone] to do so.  Due to recent changes in healthcare laws, you may see the results of your imaging and laboratory studies on MyChart before your provider has had a chance to review them.  We understand that in some cases there may be results that are confusing or concerning to you. Not all laboratory results come back in the same time frame and the provider may be  waiting for multiple results in order to interpret others.  Please give [place] 48 hours in order for your provider to thoroughly review all the results before contacting the office for clarification of your results.   It was a pleasure to see you today!  Thank you for trusting me with your gastrointestinal care!

## 2022-06-23 ENCOUNTER — Other Ambulatory Visit: Payer: Self-pay | Admitting: Cardiology

## 2022-06-23 ENCOUNTER — Telehealth: Payer: Self-pay | Admitting: Orthopedic Surgery

## 2022-06-23 ENCOUNTER — Other Ambulatory Visit: Payer: Self-pay | Admitting: Internal Medicine

## 2022-06-23 DIAGNOSIS — E7800 Pure hypercholesterolemia, unspecified: Secondary | ICD-10-CM

## 2022-06-23 NOTE — Telephone Encounter (Signed)
Patient states Dr Marlou Sa was supposed to set up MRI for her please advise the patient  [phone]. Patient would like something call in for pain  to the  [facility] on [address].

## 2022-06-25 ENCOUNTER — Encounter: Payer: Self-pay | Admitting: Orthopedic Surgery

## 2022-06-26 ENCOUNTER — Other Ambulatory Visit: Payer: Self-pay | Admitting: Surgical

## 2022-06-26 ENCOUNTER — Telehealth: Payer: Self-pay | Admitting: Gastroenterology

## 2022-06-26 ENCOUNTER — Other Ambulatory Visit: Payer: Self-pay

## 2022-06-26 DIAGNOSIS — G8929 Other chronic pain: Secondary | ICD-10-CM

## 2022-06-26 MED ORDER — TRAMADOL HCL 50 MG PO TABS: 50.0000 mg | ORAL_TABLET | Freq: Two times a day (BID) | ORAL | 0 refills | Status: AC | PRN

## 2022-06-26 NOTE — Telephone Encounter (Signed)
Last refill 04/08/21. Ok to refill?

## 2022-06-26 NOTE — Telephone Encounter (Signed)
Mateo Flow from [facility] called requesting to speak with regarding upcoming appts. [phone]

## 2022-06-26 NOTE — Telephone Encounter (Signed)
Dr Loletha Carrow please give Dx for MRI abd/pelvis. Mateo Flow said the pancreatitis cyst covers the abdomen but not the pelvis.

## 2022-06-26 NOTE — Telephone Encounter (Signed)
Ic patient and advised.

## 2022-06-26 NOTE — Telephone Encounter (Signed)
Yes  thx

## 2022-06-26 NOTE — Telephone Encounter (Signed)
Just abdomen will be sufficient.  Do not need to image the pelvis.  - HD

## 2022-06-26 NOTE — Telephone Encounter (Signed)
I sent in RX for tramadol to help with pain as a temporary measure

## 2022-06-26 NOTE — Telephone Encounter (Signed)
Radiology will remove the pelvis scan

## 2022-06-27 NOTE — Telephone Encounter (Signed)
Sent  message to [facility] with [facility] to see if can get pt in ASAP

## 2022-06-28 ENCOUNTER — Ambulatory Visit (HOSPITAL_COMMUNITY)
Admission: RE | Admit: 2022-06-28 | Discharge: 2022-06-28 | Disposition: A | Discharge status: Home / Self Care | Payer: Medicare Other | Source: Ambulatory Visit | Attending: Gastroenterology | Admitting: Gastroenterology

## 2022-06-28 ENCOUNTER — Ambulatory Visit (HOSPITAL_COMMUNITY): Payer: Medicare Other

## 2022-06-28 DIAGNOSIS — K860 Alcohol-induced chronic pancreatitis: Secondary | ICD-10-CM | POA: Diagnosis present

## 2022-06-28 DIAGNOSIS — K862 Cyst of pancreas: Secondary | ICD-10-CM | POA: Insufficient documentation

## 2022-06-28 MED ORDER — GADOBUTROL 1 MMOL/ML IV SOLN
7.0000 mL | Freq: Once | INTRAVENOUS | Status: AC | PRN
  Administered 2022-06-28: 7 mL via INTRAVENOUS

## 2022-06-30 ENCOUNTER — Ambulatory Visit: Payer: Medicare Other | Admitting: Orthopedic Surgery

## 2022-07-01 ENCOUNTER — Ambulatory Visit
Admission: RE | Admit: 2022-07-01 | Discharge: 2022-07-01 | Disposition: A | Discharge status: Home / Self Care | Payer: Medicare Other | Source: Ambulatory Visit | Attending: Orthopedic Surgery | Admitting: Orthopedic Surgery

## 2022-07-01 DIAGNOSIS — G8929 Other chronic pain: Secondary | ICD-10-CM

## 2022-07-07 ENCOUNTER — Ambulatory Visit: Payer: Self-pay

## 2022-07-07 ENCOUNTER — Ambulatory Visit (INDEPENDENT_AMBULATORY_CARE_PROVIDER_SITE_OTHER): Payer: Medicare Other | Admitting: Surgical

## 2022-07-07 DIAGNOSIS — M25552 Pain in left hip: Secondary | ICD-10-CM

## 2022-07-09 ENCOUNTER — Encounter: Payer: Self-pay | Admitting: Surgical

## 2022-07-09 MED ORDER — BUPIVACAINE HCL 0.25 % IJ SOLN
4.0000 mL | INTRAMUSCULAR | Status: AC | PRN
  Administered 2022-07-07: 4 mL via INTRA_ARTICULAR

## 2022-07-09 MED ORDER — LIDOCAINE HCL 1 % IJ SOLN
5.0000 mL | INTRAMUSCULAR | Status: AC | PRN
  Administered 2022-07-07: 5 mL

## 2022-07-09 MED ORDER — METHYLPREDNISOLONE ACETATE 40 MG/ML IJ SUSP
40.0000 mg | INTRAMUSCULAR | Status: AC | PRN
  Administered 2022-07-07: 40 mg via INTRA_ARTICULAR

## 2022-07-09 NOTE — Progress Notes (Signed)
Office Visit Note   Patient: Brenda [name]           Date of Birth: July 27, 1956           MRN: [id number] Visit Date: 07/07/2022 Requested by: [name], MD [address] [place],  [place] [address] PCP: [name], MD  Subjective: Chief Complaint  Patient presents with   Other     Hip pain/review MRI    HPI: Brenda [name] is a 66 y.o. female who presents to the office reporting low back pain and left hip pain.  She complains of continued pain that radiates down from her left buttock to the bottom of her foot at times.  This is fairly frequent but most of her pain she localizes to the lateral aspect of the left hip that radiates down to about the knee.  She also has some new burning pain in the lateral aspect of the right hip that has begun to bother her and is a new symptom since her last visit.  She denies any history of groin pain.  Left leg bothers her much more than the right leg.  Most of her low back pain she localizes to the left aspect of the low back.  No history of lumbar spine surgery or hip surgery.  She enjoys doing exercise classes and staying fit by doing body pump and yoga.  She also walks fairly regularly, usually about a mile at a time..                ROS: All systems reviewed are negative as they relate to the chief complaint within the history of present illness.  Patient denies fevers or chills.  Assessment & Plan: Visit Diagnoses:  1. Pain in left hip     Plan: Patient is a 66 year old female who presents for evaluation of hip pain and low back pain.  She is here to review MRI scan of the lumbar spine as well.  Lumbar spine MRI demonstrates disc space narrowing at L5-S1 with maybe a little bit of asymmetric narrowing of the neural foramen on the left side versus the right which may be contributing to the radicular pain she has traveling down to the bottom of her foot on the left side.  She also has findings on exam consistent with greater  trochanteric pain syndrome of her left leg.  She may have 2 distinct problems going on.  She also has history of prior CT scan of her abdomen pelvis from 2020 that demonstrated some potential findings of AVN but there are no identifiable characteristics of AVN on recent radiographs from earlier this year of her lumbar spine that included her hips.  No pain with hip range of motion and she is not having any groin pain.  After discussion of options, she would like to try left trochanteric injection.  This successfully delivered under ultrasound guidance after identification of the posterior facet of the greater trochanter.  Injection was delivered without any resistance to flow and attention was made to avoid the gluteal tendons.  Tolerated procedure well.  We will also set her up for lumbar spine ESI with Dr. Ernestina Patches to see which injection is more helpful for her.  Follow-up with the office as needed if symptoms persist after these injections.  Follow-Up Instructions: No follow-ups on file.   Orders:  Orders Placed This Encounter  Procedures   US Guided Needle Placement - No Linked Charges   No orders of  the defined types were placed in this encounter.     Procedures: Large Joint Inj: L greater trochanter on 07/07/2022 1:05 PM Indications: pain and diagnostic evaluation Details: 22 G 3.5 in needle, ultrasound-guided posterior approach  Arthrogram: No  Medications: 5 mL lidocaine 1 %; 4 mL bupivacaine 0.25 %; 40 mg methylPREDNISolone acetate 40 MG/ML Outcome: tolerated well, no immediate complications Procedure, treatment alternatives, risks and benefits explained, specific risks discussed. Consent was given by the patient. Immediately prior to procedure a time out was called to verify the correct patient, procedure, equipment, support staff and site/side marked as required. Patient was prepped and draped in the usual sterile fashion.       Clinical Data: No additional  findings.  Objective: Vital Signs: LMP  (LMP Unknown)   Physical Exam:  Constitutional: Patient appears well-developed HEENT:  Head: Normocephalic Eyes:EOM are normal Neck: Normal range of motion Cardiovascular: Normal rate Pulmonary/chest: Effort normal Neurologic: Patient is alert Skin: Skin is warm Psychiatric: Patient has normal mood and affect  Ortho Exam: Ortho exam demonstrates 5/5 motor strength of bilateral hip flexion, quadricep, hamstring, dorsiflexion, plantarflexion.  No pain with hip range of motion with negative FADIR sign and negative Stinchfield sign.  She has excellent strength with hip abduction and walks without Trendelenburg gait.  She does have some reproduction of her left lateral hip pain with resisted hip abduction when she is in lateral decubitus position.  Moderate tenderness over the left greater trochanter, mild tenderness over the right.  She is able to actively internally rotate her left hip while she is in lateral position.  Mild tenderness throughout the axial lumbar spine.  Normal patellar tendon reflexes rated 2+ bilaterally.  No clonus bilaterally.  Specialty Comments:  No specialty comments available.  Imaging: No results found.   PMFS History: Patient Active Problem List   Diagnosis Date Noted   Vision loss 04/08/2021   Optic neuritis, retrobulbar (acute), left 03/29/2021   Severe obstructive sleep apnea-hypopnea syndrome 02/25/2021   PAD (peripheral artery disease) ([place]) 01/31/2021   Memory deficit following unspecified cerebrovascular disease 01/31/2021   Snoring 01/31/2021   Excessive daytime sleepiness 01/31/2021   Cognitive deficit with impaired visuospatial function 01/31/2021   Pancreatic insufficiency 05/30/2019   Encounter for general adult medical examination with abnormal findings 47/42/5956   Monoallelic mutation of PALB2 gene 10/25/2018   Genetic testing 10/25/2018   Malignant neoplasm of central portion of left breast in  female, estrogen receptor negative ([place]) 10/08/2018   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/08/2018   Hypothyroidism 03/02/2018   Lesion of pancreas 06/22/2017   Constipation 06/22/2017   Allergic rhinitis 02/25/2016   Low back pain 09/29/2015   Carpal tunnel syndrome, right 08/20/2015   Hx of completed stroke 05/21/2015   Hyperthyroidism 12/29/2008   HYPERCHOLESTEROLEMIA 12/29/2008   Anxiety 12/29/2008   Essential hypertension 12/29/2008   Coronary atherosclerosis 12/29/2008   CAROTID ARTERY STENOSIS 12/29/2008   Past Medical History:  Diagnosis Date   Alcoholism [facility])    Quit 1998   Allergy    Anemia    Anxiety    Breast cancer, left [facility]) oncologist-- dr Jana Hakim   dx 1999,  DCIS;  s/p  left lumpectomy w/ node dissection's;  completed chemo and radiation 2000:  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, Grade 2, Stage 0 (ER/PR +);  11-07-2018  s/p  bilateral mastectomies   Carotid artery stenosis    bilateral---  04-27-2000  s/p  left carotid endarterectomy:  last  doppler in epic 08-13-2018  right ICA 40-59%,  right ECA >50%,  left ICA 1-39% ,  bilateral subclavians stenotic   CHF (congestive heart failure) (HCC)    Chronic stable angina    followed by cardiology   Complication of anesthesia    PONV   Coronary artery disease cardiologist-- dr Stanford Breed   06-12-2000  s/p  CABG x5  :  cardiac cath 02-21-2018 --  normal LVF, 80% LM, occluded RCA, 95% Ramus and 100% OM,  Patent seqSVG to  PLA-PDA & LIMA to LAD,  seqSVG to OM and RI occluded   Depression    Family history of lung cancer    History of blood transfusion    History of CVA with residual deficit    short term memory loss per pt   History of Helicobacter pylori infection 2001   Hyperlipidemia    Hypertension    Hyperthyroidism    dx yrs ago, per pt have taken medication for awhile   Mild mitral regurgitation    per echo 06/ 2019   PAD (peripheral artery disease) ([place])    Peripheral vascular  disease ([place])    managed by vascular-- dr Donzetta Matters   PONV (postoperative nausea and vomiting)    S/P CABG x 5 06-12-2000   by dr Lucianne Lei tright _0    Severe obstructive sleep apnea-hypopnea syndrome 02/25/2021   Stroke ([place]) 2002   mild memory loss    Family History  Problem Relation Age of Onset   Lung cancer Mother 1       died at an early age   Other Other        There is no Hx of premature coronary artery disease in her family   Colon cancer Neg Hx    Rectal cancer Neg Hx    Stomach cancer Neg Hx     Past Surgical History:  Procedure Laterality Date   ABDOMINAL AORTOGRAM W/LOWER EXTREMITY N/A 06/13/2017   Procedure: ABDOMINAL AORTOGRAM W/LOWER EXTREMITY;  Surgeon: Waynetta Sandy, MD;  Location: [place] [facility];  Service: Cardiovascular;  Laterality: N/A;  Bilateral   ABDOMINAL HYSTERECTOMY  1998   BREAST LUMPECTOMY WITH AXILLARY LYMPH NODE DISSECTION Left 1999   CARDIAC CATHETERIZATION  04-24-2000  dr Caryl Comes   3V CAD , subtotal PDA w/ slow flow colleterals , ef 48%   CARDIAC CATHETERIZATION  06/11/2000   3V CAD with progression of LAD disease   CAROTID ENDARTERECTOMY Left 04-27-2000   dr Amedeo Plenty  _1    COLONOSCOPY  06/24/2019   [facility]   CORONARY ARTERY BYPASS GRAFT  06-12-2000   dr Lucianne Lei tright _2    x 5 (left internal mammary artery to left anterior  descending coronary artery   LEFT HEART CATH AND CORS/GRAFTS ANGIOGRAPHY N/A 02/21/2018   Procedure: LEFT HEART CATH AND CORS/GRAFTS ANGIOGRAPHY;  Surgeon: Lorretta Harp, MD;  Location: [place] [facility];  Service: Cardiovascular;  Laterality: N/A;   PERIPHERAL VASCULAR INTERVENTION  06/13/2017   Procedure: PERIPHERAL VASCULAR INTERVENTION;  Surgeon: Waynetta Sandy, MD;  Location: [place] [facility];  Service: Cardiovascular;;  Bilateral Iliac Stents   ROBOTIC ASSISTED SALPINGO OOPHERECTOMY Bilateral 03/13/2019   Procedure: XI ROBOTIC ASSISTED BILATERAL  SALPINGO OOPHORECTOMY;  Surgeon: Everitt Amber, MD;  Location:  [place];  Service: Gynecology;  Laterality: Bilateral;   TOTAL MASTECTOMY Bilateral 11/07/2018   Procedure: BILATERAL MASTECTOMIES;  Surgeon: Stark Klein, MD;  Location: [place];  Service: General;  Laterality: Bilateral;  Social History   Occupational History   Occupation: Education officer, museum, 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

## 2022-07-11 ENCOUNTER — Other Ambulatory Visit: Payer: Self-pay

## 2022-07-11 DIAGNOSIS — G8929 Other chronic pain: Secondary | ICD-10-CM

## 2022-07-20 ENCOUNTER — Ambulatory Visit (INDEPENDENT_AMBULATORY_CARE_PROVIDER_SITE_OTHER): Payer: Medicare Other | Admitting: Physical Medicine and Rehabilitation

## 2022-07-20 ENCOUNTER — Encounter: Payer: Self-pay | Admitting: Physical Medicine and Rehabilitation

## 2022-07-20 DIAGNOSIS — M5416 Radiculopathy, lumbar region: Secondary | ICD-10-CM

## 2022-07-20 DIAGNOSIS — M4726 Other spondylosis with radiculopathy, lumbar region: Secondary | ICD-10-CM | POA: Diagnosis not present

## 2022-07-20 DIAGNOSIS — M47816 Spondylosis without myelopathy or radiculopathy, lumbar region: Secondary | ICD-10-CM | POA: Diagnosis not present

## 2022-07-20 NOTE — Progress Notes (Signed)
Brenda [name] - 66 y.o. female MRN [id number]  Date of birth: Feb 12, 1956  Office Visit Note: Visit Date: 07/20/2022 PCP: [name], MD Referred by: [name], *  Subjective: Chief Complaint  Patient presents with   Lower Back - Pain   HPI: Brenda [name] is a 66 y.o. female who comes in today for evaluation of chronic bilateral lower back pain radiating to left buttock, hip and down left lateral leg. Last seen in our office in 2018. Reports pain ongoing for several years and is more intermittent. Sitting and resting tend to exacerbate her pain, states she feels best with routine exercise and activity. She describes her pain as a sore, aching and burning sensation, currently rates as 5 out of 10. Some relief of pain with home exercise regimen, YOGA and use of medications. Patient does take 300 mg Gabapentin as needed at bedtime, usually 2-3 times a week. Has attended formal physical therapy in the past at [facility] with some relief of pain. Recent lumbar MRI imaging exhibits multi level degenerative changes, no neural compression or progression from 2021. No high grade spinal canal stenosis noted. History of lumbar epidural steroid injection and facet injections in our office in 2018, minimal relief of pain with these procedure. More recently patient evaluated by Annie Main, PA, underwent left greater trochanter injection with some short term relief of pain. Left lower extremity NCV with EMG by Dr. Narda Amber in 2018 was normal. Patient is a very active person, feels her pain is much more manageable with regular physical activity and adequate sleep. Patient denies focal weakness, numbness and tingling. Patient denies recent trauma or falls.    Review of Systems  Musculoskeletal:  Positive for back pain.  Neurological:  Negative for tingling, sensory change, focal weakness and weakness.  All other systems reviewed and are negative.  Otherwise per  HPI.  Assessment & Plan: Visit Diagnoses:    ICD-10-CM   1. Lumbar radiculopathy  M54.16     2. Other spondylosis with radiculopathy, lumbar region  M47.26     3. Facet hypertrophy of lumbar region  M47.816        Plan: Findings:  chronic bilateral lower back pain radiating to left buttock, hip and down left lateral leg.  Patient continues to have pain despite good conservative therapy such as formal physical therapy, home exercise regimen and use of medications.  Patient's clinical presentation and exam are consistent with L5 nerve pattern, however this does not directly correlate with recent lumbar MRI findings. Recent lumbar MRI imaging looks good for her age, no real nerve root compression. I also feel there could be a type of central sensitization syndrome such as fibromyalgia contributing to her symptoms. I did discuss possibility of performing lumbar epidural steroid injection however she would like to hold on this procedure for now as she does feel her pain is manageable.  I also spoke with her about medication management and recommend starting Gabapentin 300 mg at bedtime for 2 weeks then titrate up to 300 mg BID if tolerating without difficulty. I am happy to refill her prescription if needed. If her pain persists we would re-evaluate possible injection, could also look at re-grouping with physical therapy. I would like to see patient back in approximately 4 weeks for follow up. No red flag symptoms noted upon exam today.     Meds & Orders: No orders of the defined types were placed in this encounter.  No orders of  the defined types were placed in this encounter.   Follow-up: Return for 4 week follow up for re-evaluation.   Procedures: No procedures performed      Clinical History: EXAM: MRI LUMBAR SPINE WITHOUT CONTRAST   TECHNIQUE: Multiplanar, multisequence MR imaging of the lumbar spine was performed. No intravenous contrast was administered.   COMPARISON:  04/12/2020    FINDINGS: Segmentation:  5 lumbar type vertebrae   Alignment:  Mild degenerative anterolisthesis at L4-5   Vertebrae:  No fracture, evidence of discitis, or bone lesion.   Conus medullaris and cauda equina: Conus extends to the L2 level. Conus and cauda equina appear normal.   Paraspinal and other soft tissues: Negative for perispinal inflammation or masslike finding. Advanced atrophy at the upper pole right kidney.   Disc levels:   T12- L1: Unremarkable.   L1-L2: Unremarkable.   L2-L3: Disc bulging.   L3-L4: Mild disc narrowing and bulging.   L4-L5: Degenerative facet spurring with mild anterolisthesis. Disc space narrowing and bulging. No neural compression   L5-S1:Disc space narrowing and bulging with endplate degeneration. Bilateral facet spurring. Right-sided nerve roots appear conjoined proximally. No nerve root compression.   IMPRESSION: Lumbar spine degeneration with mild L4-5 anterolisthesis. No neural compression or progression from 2021.     Electronically Signed   By: Jorje Guild M.D.   On: 07/01/2022 09:51   She reports that she quit smoking about 20 years ago. Her smoking use included cigarettes. She has never used smokeless tobacco. No results for input(s): "HGBA1C", "LABURIC" in the last 8760 hours.  Objective:  VS:  HT:    WT:   BMI:     BP:   HR: bpm  TEMP: ( )  RESP:  Physical Exam Vitals and nursing note reviewed.  HENT:     Head: Normocephalic and atraumatic.     Right Ear: External ear normal.     Left Ear: External ear normal.     Nose: Nose normal.     Mouth/Throat:     Mouth: Mucous membranes are moist.  Eyes:     Extraocular Movements: Extraocular movements intact.  Cardiovascular:     Rate and Rhythm: Normal rate.     Pulses: Normal pulses.  Pulmonary:     Effort: Pulmonary effort is normal.  Abdominal:     General: Abdomen is flat. There is no distension.  Musculoskeletal:        General: Tenderness present.      Cervical back: Normal range of motion.     Comments: Pt rises from seated position to standing without difficulty. Good lumbar range of motion. Strong distal strength without clonus, no pain upon palpation of greater trochanters. Sensation intact bilaterally. Dysesthesias noted to left L5 dermatome. Walks independently, gait steady.   Skin:    General: Skin is warm and dry.     Capillary Refill: Capillary refill takes less than 2 seconds.  Neurological:     General: No focal deficit present.     Mental Status: She is alert and oriented to person, place, and time.  Psychiatric:        Mood and Affect: Mood normal.        Behavior: Behavior normal.     Ortho Exam  Imaging: No results found.  Past Medical/Family/Surgical/Social History: Medications & Allergies reviewed per EMR, new medications updated. Patient Active Problem List   Diagnosis Date Noted   Vision loss 04/08/2021   Optic neuritis, retrobulbar (acute), left 03/29/2021   Severe obstructive  sleep apnea-hypopnea syndrome 02/25/2021   PAD (peripheral artery disease) ([place]) 01/31/2021   Memory deficit following unspecified cerebrovascular disease 01/31/2021   Snoring 01/31/2021   Excessive daytime sleepiness 01/31/2021   Cognitive deficit with impaired visuospatial function 01/31/2021   Pancreatic insufficiency 05/30/2019   Encounter for general adult medical examination with abnormal findings 65/68/1275   Monoallelic mutation of PALB2 gene 10/25/2018   Genetic testing 10/25/2018   Malignant neoplasm of central portion of left breast in female, estrogen receptor negative ([place]) 10/08/2018   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/08/2018   Hypothyroidism 03/02/2018   Lesion of pancreas 06/22/2017   Constipation 06/22/2017   Allergic rhinitis 02/25/2016   Low back pain 09/29/2015   Carpal tunnel syndrome, right 08/20/2015   Hx of completed stroke 05/21/2015   Hyperthyroidism 12/29/2008   HYPERCHOLESTEROLEMIA  12/29/2008   Anxiety 12/29/2008   Essential hypertension 12/29/2008   Coronary atherosclerosis 12/29/2008   CAROTID ARTERY STENOSIS 12/29/2008   Past Medical History:  Diagnosis Date   Alcoholism ([place])    Quit 1998   Allergy    Anemia    Anxiety    Breast cancer, left [facility]) oncologist-- dr Jana Hakim   dx 1999,  DCIS;  s/p  left lumpectomy w/ node dissection's;  completed chemo and radiation 2000:  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, Grade 2, Stage 0 (ER/PR +);  11-07-2018  s/p  bilateral mastectomies   Carotid artery stenosis    bilateral---  04-27-2000  s/p  left carotid endarterectomy:  last doppler in epic 08-13-2018  right ICA 40-59%,  right ECA >50%,  left ICA 1-39% ,  bilateral subclavians stenotic   CHF (congestive heart failure) ([place])    Chronic stable angina    followed by cardiology   Complication of anesthesia    PONV   Coronary artery disease cardiologist-- dr Stanford Breed   06-12-2000  s/p  CABG x5  :  cardiac cath 02-21-2018 --  normal LVF, 80% LM, occluded RCA, 95% Ramus and 100% OM,  Patent seqSVG to  PLA-PDA & LIMA to LAD,  seqSVG to OM and RI occluded   Depression    Family history of lung cancer    History of blood transfusion    History of CVA with residual deficit    short term memory loss per pt   History of Helicobacter pylori infection 2001   Hyperlipidemia    Hypertension    Hyperthyroidism    dx yrs ago, per pt have taken medication for awhile   Mild mitral regurgitation    per echo 06/ 2019   PAD (peripheral artery disease) ([place])    Peripheral vascular disease ([place])    managed by vascular-- dr Donzetta Matters   PONV (postoperative nausea and vomiting)    S/P CABG x 5 06-12-2000   by dr Lucianne Lei tright _0    Severe obstructive sleep apnea-hypopnea syndrome 02/25/2021   Stroke ([place]) 2002   mild memory loss   Family History  Problem Relation Age of Onset   Lung cancer Mother 42       died at an early age   Other Other        There is no Hx of premature  coronary artery disease in her family   Colon cancer Neg Hx    Rectal cancer Neg Hx    Stomach cancer Neg Hx    Past Surgical History:  Procedure Laterality Date   ABDOMINAL AORTOGRAM W/LOWER EXTREMITY N/A 06/13/2017   Procedure: ABDOMINAL AORTOGRAM  W/LOWER EXTREMITY;  Surgeon: Waynetta Sandy, MD;  Location: [place] [facility];  Service: Cardiovascular;  Laterality: N/A;  Bilateral   ABDOMINAL HYSTERECTOMY  1998   BREAST LUMPECTOMY WITH AXILLARY LYMPH NODE DISSECTION Left 1999   CARDIAC CATHETERIZATION  04-24-2000  dr Caryl Comes   3V CAD , subtotal PDA w/ slow flow colleterals , ef 48%   CARDIAC CATHETERIZATION  06/11/2000   3V CAD with progression of LAD disease   CAROTID ENDARTERECTOMY Left 04-27-2000   dr Amedeo Plenty  _0    COLONOSCOPY  06/24/2019   [facility]   CORONARY ARTERY BYPASS GRAFT  06-12-2000   dr Lucianne Lei tright _1    x 5 (left internal mammary artery to left anterior  descending coronary artery   LEFT HEART CATH AND CORS/GRAFTS ANGIOGRAPHY N/A 02/21/2018   Procedure: LEFT HEART CATH AND CORS/GRAFTS ANGIOGRAPHY;  Surgeon: Lorretta Harp, MD;  Location: [place] [facility];  Service: Cardiovascular;  Laterality: N/A;   PERIPHERAL VASCULAR INTERVENTION  06/13/2017   Procedure: PERIPHERAL VASCULAR INTERVENTION;  Surgeon: Waynetta Sandy, MD;  Location: [place] [facility];  Service: Cardiovascular;;  Bilateral Iliac Stents   ROBOTIC ASSISTED SALPINGO OOPHERECTOMY Bilateral 03/13/2019   Procedure: XI ROBOTIC ASSISTED BILATERAL  SALPINGO OOPHORECTOMY;  Surgeon: Everitt Amber, MD;  Location: [place];  Service: Gynecology;  Laterality: Bilateral;   TOTAL MASTECTOMY Bilateral 11/07/2018   Procedure: BILATERAL MASTECTOMIES;  Surgeon: Stark Klein, MD;  Location: [place];  Service: General;  Laterality: Bilateral;   Social History   Occupational History   Occupation: Education officer, museum, 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

## 2022-07-20 NOTE — Progress Notes (Signed)
Says that back is doing ok, more in the legs, warm sensation on the right side when her stuff originated on the left side. Does exercises to keep her strong.

## 2022-07-24 ENCOUNTER — Telehealth: Payer: Self-pay

## 2022-08-01 ENCOUNTER — Other Ambulatory Visit: Payer: Self-pay | Admitting: *Deleted

## 2022-08-01 ENCOUNTER — Encounter: Payer: Self-pay | Admitting: *Deleted

## 2022-08-01 DIAGNOSIS — E7800 Pure hypercholesterolemia, unspecified: Secondary | ICD-10-CM

## 2022-08-08 LAB — HEPATIC FUNCTION PANEL
ALT: 30 IU/L (ref 0–32)
AST: 26 IU/L (ref 0–40)
Albumin: 4.3 g/dL (ref 3.9–4.9)
Alkaline Phosphatase: 75 IU/L (ref 44–121)
Bilirubin Total: 1 mg/dL (ref 0.0–1.2)
Bilirubin, Direct: 0.29 mg/dL (ref 0.00–0.40)
Total Protein: 7.1 g/dL (ref 6.0–8.5)

## 2022-08-08 LAB — LIPID PANEL
Chol/HDL Ratio: 2.2 ratio (ref 0.0–4.4)
Cholesterol, Total: 128 mg/dL (ref 100–199)
HDL: 57 mg/dL (ref >39)
LDL Chol Calc (NIH): 58 mg/dL (ref 0–99)
Triglycerides: 62 mg/dL (ref 0–149)
VLDL Cholesterol Cal: 13 mg/dL (ref 5–40)

## 2022-08-14 NOTE — Telephone Encounter (Signed)
Left voicemail to return call to schedule appointment

## 2022-08-23 ENCOUNTER — Ambulatory Visit (HOSPITAL_COMMUNITY)
Admission: RE | Admit: 2022-08-23 | Payer: Medicare Other | Source: Ambulatory Visit | Attending: Cardiology | Admitting: Cardiology

## 2022-08-29 ENCOUNTER — Other Ambulatory Visit: Payer: Self-pay | Admitting: Cardiology

## 2022-08-29 DIAGNOSIS — E7800 Pure hypercholesterolemia, unspecified: Secondary | ICD-10-CM

## 2022-08-29 DIAGNOSIS — R0602 Shortness of breath: Secondary | ICD-10-CM

## 2022-08-29 DIAGNOSIS — I2510 Atherosclerotic heart disease of native coronary artery without angina pectoris: Secondary | ICD-10-CM

## 2022-08-29 DIAGNOSIS — I10 Essential (primary) hypertension: Secondary | ICD-10-CM

## 2022-08-29 MED ORDER — METOPROLOL SUCCINATE ER 100 MG PO TB24: ORAL_TABLET | ORAL | 2 refills | Status: DC

## 2022-08-29 NOTE — Telephone Encounter (Signed)
Medication refilled per request. E-sent to pharmacy

## 2022-08-30 ENCOUNTER — Ambulatory Visit (HOSPITAL_COMMUNITY)
Admission: RE | Admit: 2022-08-30 | Discharge: 2022-08-30 | Disposition: A | Discharge status: Home / Self Care | Payer: Medicare Other | Source: Ambulatory Visit | Attending: Cardiovascular Disease | Admitting: Cardiovascular Disease

## 2022-08-30 DIAGNOSIS — I6523 Occlusion and stenosis of bilateral carotid arteries: Secondary | ICD-10-CM | POA: Diagnosis present

## 2022-09-01 ENCOUNTER — Encounter: Payer: Self-pay | Admitting: *Deleted

## 2022-09-08 ENCOUNTER — Telehealth: Payer: Self-pay | Admitting: Cardiology

## 2022-09-08 NOTE — Telephone Encounter (Signed)
Returned patient's call to give her the bilateral carotid duplex results per Dr. Stanford Breed: F/U in 12 months."   Patient stated she was upset because of the way the scheduler handled her phone call. Let her vent her frustration and provided emotional support to her.

## 2022-09-08 NOTE — Telephone Encounter (Signed)
Patient is calling to get results read to her over phone.

## 2022-10-01 NOTE — Progress Notes (Signed)
Subjective:    Patient ID: Brenda [name], female    DOB: [birth year], 67 y.o.   MRN: [id number]      HPI Brenda [name] is here for  Chief Complaint  Patient presents with   Sinusitis    Congestion (not as bad now); Started week of New Years. Productive cough/nasal drainage     She is here for an acute visit for cold symptoms.  She is also had some stomach upset.  She was experiencing stomach issues - a lot of gas and upset stomach.  This all started around [place].  She had ginger tea and ate lightly and it went away.  Next week she went to the gym and that a cold.  She had nasal congestion, PND, chest congestion and cough.  Her symptoms have improved, but she still has some residual symptoms  She is better.  She had a little stomach upset this morning, but is already starting to feel better.  She had two cups of coffee and took her meds this morning, but has not eaten.   Yesterday she did clean up her house and was moving about.  Her energy level is better.   She has tried taking over-the-counter cold medications for symptom relief.      Medications and allergies reviewed with patient and updated if appropriate.  Current Outpatient Medications on File Prior to Visit  Medication Sig Dispense Refill   amLODipine (NORVASC) 10 MG tablet Take 1 tablet (10 mg total) by mouth daily. 90 tablet 3   aspirin EC 81 MG tablet Take 1 tablet (81 mg total) by mouth daily. Swallow whole. 90 tablet 3   carboxymethylcellulose (REFRESH PLUS) 0.5 % SOLN Apply to eye.     ezetimibe (ZETIA) 10 MG tablet Take 1 tablet (10 mg total) by mouth daily. 90 tablet 3   FIBER ADULT GUMMIES PO Take by mouth.     gabapentin (NEURONTIN) 300 MG capsule Take 1 capsule (300 mg total) by mouth 3 (three) times daily as needed (nerve pain). 90 capsule 3   metoprolol succinate (TOPROL-XL) 100 MG 24 hr tablet TAKE 1 TABLET BY MOUTH ONCE DAILY OR  IMMEDIATELY  FOLLOWING  A  MEAL 90 tablet 2   nitroGLYCERIN (NITROSTAT)  0.4 MG SL tablet Place 1 tablet (0.4 mg total) under the tongue every 5 (five) minutes as needed for chest pain. 1 tablet under tongue every 5 minutes as needed for chest discomfort (max 2 Tab/day) 25 tablet 4   Potassium Chloride ER 20 MEQ TBCR TAKE 1 TABLET BY MOUTH ON MONDAY, WEDNESDAYS, FRIDAYS AND SUNDAYS 45 tablet 0   rosuvastatin (CRESTOR) 40 MG tablet Take 1 tablet by mouth once daily 90 tablet 3   traMADol (ULTRAM) 50 MG tablet Take 1 tablet (50 mg total) by mouth every 12 (twelve) hours as needed. 20 tablet 0   triamcinolone cream (KENALOG) 0.1 % Apply 1 application. topically 2 (two) times daily. 100 g 0   Vitamin D, Ergocalciferol, 50000 units CAPS Take 1 capsule by mouth 4 (four) times a week.     No current facility-administered medications on file prior to visit.    Review of Systems  Constitutional:  Negative for appetite change, chills and fever.  HENT:  Positive for postnasal drip (minimal). Negative for congestion, ear pain, sinus pressure, sinus pain and sore throat.   Respiratory:  Negative for cough, chest tightness, shortness of breath and wheezing.        Little chest congestion -  mucus  Gastrointestinal:  Negative for abdominal pain.  Neurological:  Positive for light-headedness (a little). Negative for headaches.       Objective:   Vitals:   10/02/22 0953  BP: 106/78  Pulse: (!) 58  Temp: 98.2 F (36.8 C)  SpO2: 99%   BP Readings from Last 3 Encounters:  10/02/22 106/78  06/19/22 96/64  06/08/22 110/82   Wt Readings from Last 3 Encounters:  10/02/22 153 lb (69.4 kg)  06/19/22 152 lb 6 oz (69.1 kg)  06/08/22 151 lb (68.5 kg)   Body mass index is 26.26 kg/m.    Physical Exam Constitutional:      General: She is not in acute distress.    Appearance: Normal appearance. She is not ill-appearing.  HENT:     Head: Normocephalic and atraumatic.     Right Ear: Tympanic membrane, ear canal and external ear normal.     Left Ear: Tympanic membrane, ear  canal and external ear normal.     Mouth/Throat:     Mouth: Mucous membranes are moist.     Pharynx: No oropharyngeal exudate or posterior oropharyngeal erythema.  Eyes:     Conjunctiva/sclera: Conjunctivae normal.  Cardiovascular:     Rate and Rhythm: Normal rate and regular rhythm.  Pulmonary:     Effort: Pulmonary effort is normal. No respiratory distress.     Breath sounds: Normal breath sounds. No wheezing or rales.  Abdominal:     General: There is no distension.     Palpations: Abdomen is soft.     Tenderness: There is no abdominal tenderness.  Musculoskeletal:     Cervical back: Neck supple. No tenderness.  Lymphadenopathy:     Cervical: No cervical adenopathy.  Skin:    General: Skin is warm and dry.  Neurological:     Mental Status: She is alert.            Assessment & Plan:    URI: Acute Viral Improved-now with only residual symptoms No signs of active infection Symptoms likely postviral in nature and improving Continue symptomatic treatment  Stomach upset: Has had some nondescript stomach upset this morning and just after New Year's She denies any pain, just some gas and discomfort Likely related to something she ate more viral in nature Symptoms have improved so no further evaluation or treatment necessary  Hypertension: Chronic Blood pressure well-controlled here today Continue amlodipine 10 mg daily, metoprolol XL 100 mg daily

## 2022-10-02 ENCOUNTER — Ambulatory Visit (INDEPENDENT_AMBULATORY_CARE_PROVIDER_SITE_OTHER): Payer: Medicare Other | Admitting: Internal Medicine

## 2022-10-02 ENCOUNTER — Encounter: Payer: Self-pay | Admitting: Internal Medicine

## 2022-10-02 VITALS — BP 106/78 | HR 58 | Temp 98.2°F | Ht 64.0 in | Wt 153.0 lb

## 2022-10-02 DIAGNOSIS — I10 Essential (primary) hypertension: Secondary | ICD-10-CM | POA: Diagnosis not present

## 2022-10-02 DIAGNOSIS — J069 Acute upper respiratory infection, unspecified: Secondary | ICD-10-CM

## 2022-10-02 DIAGNOSIS — K30 Functional dyspepsia: Secondary | ICD-10-CM

## 2022-10-02 NOTE — Patient Instructions (Addendum)
        Medications changes include :  none  ? ? ? ? ? ?Return if symptoms worsen or fail to improve. ? ?

## 2022-10-25 ENCOUNTER — Other Ambulatory Visit: Payer: Self-pay | Admitting: Cardiology

## 2022-11-09 ENCOUNTER — Other Ambulatory Visit: Payer: Self-pay | Admitting: Internal Medicine

## 2023-01-11 ENCOUNTER — Telehealth: Payer: Self-pay | Admitting: Cardiology

## 2023-01-11 NOTE — Telephone Encounter (Signed)
Patient said that she did not got to the ER due to feeling better but still wants a quicker appt with APP or MD

## 2023-01-11 NOTE — Telephone Encounter (Signed)
Pt called and is stating that she is lightheaded and having chest discomfort/pain that goes across chest to her left shoulder. She states that this has been happening since 857am, about 15 min now. She states that this also happened last night about 8pm when she was also light headed "after dinner at  about 8pm she took her medications at 752pm" her BP at that time 154/96 HR 74 then again 10 min later 132/76. Informed pt in the future only take BP/HR Q hour, if taken more frequently it is usually inaccurate. Informed pt to have someone take her to the ER due to chest pain. Informed pt that coming here we cannot help her here in the office as she may need IV medications. Verbalized understanding.

## 2023-01-11 NOTE — Telephone Encounter (Signed)
Pt c/o BP issue: STAT if pt c/o blurred vision, one-sided weakness or slurred speech  1. What are your last 5 BP readings? 113/74 this morning  yesterday it was 154/96 132/76 131/80 last night 93/57  2. Are you having any other symptoms (ex. Dizziness, headache, blurred vision, passed out)? Lightheaded, chest is tight right now  3. What is your BP issue? Blood pressure is going up and down- chest pressure

## 2023-01-12 NOTE — Telephone Encounter (Signed)
Pt states that she is not having any symptoms at present. I have scheduled her an appointment next week to discuss her chest discomfort and what we can do next. She will cal over the weekend with any chest pain or go to the ER if needed

## 2023-01-13 ENCOUNTER — Encounter (HOSPITAL_COMMUNITY): Payer: Self-pay

## 2023-01-13 ENCOUNTER — Emergency Department (HOSPITAL_COMMUNITY): Payer: Medicare Other

## 2023-01-13 ENCOUNTER — Other Ambulatory Visit: Payer: Self-pay

## 2023-01-13 ENCOUNTER — Emergency Department (HOSPITAL_COMMUNITY)
Admission: EM | Admit: 2023-01-13 | Discharge: 2023-01-13 | Disposition: A | Discharge status: Home / Self Care | Payer: Medicare Other | Attending: Emergency Medicine | Admitting: Emergency Medicine

## 2023-01-13 DIAGNOSIS — Z79899 Other long term (current) drug therapy: Secondary | ICD-10-CM | POA: Insufficient documentation

## 2023-01-13 DIAGNOSIS — Z951 Presence of aortocoronary bypass graft: Secondary | ICD-10-CM | POA: Diagnosis not present

## 2023-01-13 DIAGNOSIS — Z91040 Latex allergy status: Secondary | ICD-10-CM | POA: Insufficient documentation

## 2023-01-13 DIAGNOSIS — Z7982 Long term (current) use of aspirin: Secondary | ICD-10-CM | POA: Diagnosis not present

## 2023-01-13 DIAGNOSIS — I2510 Atherosclerotic heart disease of native coronary artery without angina pectoris: Secondary | ICD-10-CM | POA: Diagnosis not present

## 2023-01-13 DIAGNOSIS — R42 Dizziness and giddiness: Secondary | ICD-10-CM | POA: Insufficient documentation

## 2023-01-13 DIAGNOSIS — Z8673 Personal history of transient ischemic attack (TIA), and cerebral infarction without residual deficits: Secondary | ICD-10-CM | POA: Diagnosis not present

## 2023-01-13 LAB — CBC
HCT: 41.8 % (ref 36.0–46.0)
Hemoglobin: 14.2 g/dL (ref 12.0–15.0)
MCH: 32 pg (ref 26.0–34.0)
MCHC: 34 g/dL (ref 30.0–36.0)
MCV: 94.1 fL (ref 80.0–100.0)
Platelets: 149 10*3/uL — ABNORMAL LOW (ref 150–400)
RBC: 4.44 MIL/uL (ref 3.87–5.11)
RDW: 12.9 % (ref 11.5–15.5)
WBC: 7.5 10*3/uL (ref 4.0–10.5)
nRBC: 0 % (ref 0.0–0.2)

## 2023-01-13 LAB — BASIC METABOLIC PANEL
Anion gap: 10 (ref 5–15)
BUN: 11 mg/dL (ref 8–23)
CO2: 25 mmol/L (ref 22–32)
Calcium: 9.5 mg/dL (ref 8.9–10.3)
Chloride: 102 mmol/L (ref 98–111)
Creatinine, Ser: 0.83 mg/dL (ref 0.44–1.00)
GFR, Estimated: >60 mL/min (ref >60)
Glucose, Bld: 106 mg/dL — ABNORMAL HIGH (ref 70–99)
Potassium: 4 mmol/L (ref 3.5–5.1)
Sodium: 137 mmol/L (ref 135–145)

## 2023-01-13 LAB — URINALYSIS, ROUTINE W REFLEX MICROSCOPIC
Bilirubin Urine: NEGATIVE
Glucose, UA: NEGATIVE mg/dL
Hgb urine dipstick: NEGATIVE
Ketones, ur: NEGATIVE mg/dL
Leukocytes,Ua: NEGATIVE
Nitrite: NEGATIVE
Protein, ur: NEGATIVE mg/dL
Specific Gravity, Urine: 1.009 (ref 1.005–1.030)
pH: 6 (ref 5.0–8.0)

## 2023-01-13 MED ORDER — IOHEXOL 350 MG/ML SOLN
75.0000 mL | Freq: Once | INTRAVENOUS | Status: AC | PRN
  Administered 2023-01-13: 75 mL via INTRAVENOUS

## 2023-01-13 MED ORDER — MECLIZINE HCL 25 MG PO TABS
25.0000 mg | ORAL_TABLET | ORAL | Status: AC
  Administered 2023-01-13: 25 mg via ORAL
  Filled 2023-01-13: qty 1

## 2023-01-13 MED ORDER — LACTATED RINGERS IV BOLUS: 500.0000 mL | Freq: Once | INTRAVENOUS | Status: DC

## 2023-01-13 NOTE — Discharge Instructions (Signed)
Thank you for coming to [facility] Emergency Department. You were seen for dizziness. We did an exam, labs, and imaging, and these showed a possible stroke of your basal ganglia, however this scan was also reviewed by neurologist who disagreed and did not feel that it represented a stroke.  You had a CT scan of your head and neck to look at the vessels and you elected to leave prior to the read of the scan.  You can followed up on MyChart or with your primary care physician.  It is possible that there is an emergent finding that we will not address immediately if that is found on the CT scan due to the fact that you are leaving.  Your dizziness improved with meclizine.  You can take this over-the-counter 25 mg every 4 hours as needed for dizziness.  Please stay well-hydrated at home.  Please follow up with your primary care provider within 1 week.  Please also follow-up with your cardiologist on Monday as scheduled.  Do not hesitate to return to the ED or call 911 if you experience: -Worsening symptoms -Numbness/tingling, asymmetric weakness -Lightheadedness, passing out -Fevers/chills -Anything else that concerns you

## 2023-01-13 NOTE — ED Notes (Signed)
RN attempted IV start, informed pt that IV team consult was needed due to unsuccessful attempts. Pt asking questions about plan of care. RN updated pt, informed pt that MD would be around to speak to pt ASAP. Pt at nurse's station stating this RN did none of that and was still waiting on "a needle" and states no one has updated the pt on plan of care and that this RN was not doing her job. RN explained to pt that attempt for IV was not successful and that more help was required and that MD would update pt, RN apologized to pt. MD at bedside at this time.

## 2023-01-13 NOTE — ED Notes (Signed)
Pt refused vitals 

## 2023-01-13 NOTE — ED Notes (Signed)
Pt verbalizes understanding of discharge instructions. Opportunity for questions and answers were provided. Pt discharged from the ED.   ?

## 2023-01-13 NOTE — ED Provider Notes (Signed)
3:14 PM Assumed care of patient from off-going team. For more details, please see note from same day.  In brief, this is a 67 y.o. female w/ h/o CVA, s/p carotid endarterectomy, p/w dizziness. Not worse w/ standing/head movement. Felt vertiginous w/ driving today so presented. Dix-hallpike is negative, requiring assistance to walk. S/p meclizine.  Plan/Dispo at time of sign-out & ED Course since sign-out: [ ]  MRI brain  BP 113/72 (BP Location: Right Arm)   Pulse (!) 53   Temp 97.9 F (36.6 C) (Oral)   Resp 14   Ht 5\' 3"  (1.6 m)   Wt 68 kg   LMP  (LMP Unknown)   SpO2 99%   BMI 26.57 kg/m    ED Course:   Clinical Course as of 01/13/23 1810  Sat Jan 13, 2023  1616 MR BRAIN WO CONTRAST 1. Equivocal punctate acute or subacute infarction in the right basal ganglia. 2. Chronic small-vessel ischemic changes of the cerebral hemispheric white matter. Old small cortical infarctions in the right occipital lobe and right parietal lobe as seen previously.   [HN]  1616 Basal ganglia stroke could explain balance issues/dizziness. Consulted to neurology. [HN]  1634 D/w Dr. Wilford Corner neurology and after his review of imaging he states that she is unlikely to have R basal ganglia stroke and if she does, unlikely to correlate with her symptoms. Recommends CTA H&N, fluids, diazepam. [HN]  1730 Patient was extremely rude to nursing several times.  Went to discussed with patient who was extremely rude to me.  Called me a "smartass" and yelled over me when I was trying to answer questions.  She was wondering what the delay was between when the MRI was done and giving her results.  I informed her that it takes time for radiology to read the scans and additionally I had to speak with neurology about a questionable finding on the MRI and wanted to give her complete information.  Patient extremely unhappy about the wait time here and would like to go home.  I discussed with her the recommendations from neurology.   She actually has completely resolved dizziness after the meclizine and wonders if she can follow-up outpatient.  She also made a appointment with cardiology for Monday.  I recommend still going through with the CT scan based on the findings on the MRI as well as her symptoms earlier but if she wanted to follow-up as an outpatient I believe that is also reasonable given that her symptoms are completely resolved after the meclizine.  Patient states she will still move forward with the CT scan in order to get it done but then would like to be discharged before the results are completed, stating that I can just call her if anything is abnormal. I relayed that that isn't typically how we function in the ED because there could be emergent findings on the CT scan that require immediate intervention but I believe the chances of that are low given her symptoms have resolved. Patient and her daughter understand this risk. Patient will be DC'd w/ o/p f/u, DC instructions, return precautions. [HN]    Clinical Course User Index [HN] Loetta Rough, MD    Dispo: DC ------------------------------- Vivi Barrack, MD Emergency Medicine  This note was created using dictation software, which may contain spelling or grammatical errors.   Loetta Rough, MD 01/13/23 [phone]

## 2023-01-13 NOTE — ED Triage Notes (Addendum)
Pt arrived POV from home c/o dizziness. Pt states she has been dealing with it for a few days. Pt states it started getting better but then today she was driving and turned her held and felt like her head was swimming. Pt also endorses seeing spots in her vision but states that is not there today.

## 2023-01-13 NOTE — ED Provider Notes (Signed)
[place] [facility] AT [facility] Provider Note   CSN: [id number] Arrival date & time: 01/13/23  1130     History  Chief Complaint  Patient presents with   Dizziness    Brenda [name] is a 67 y.o. female.  67 year old female with history of CAD status post CABG, stroke without residual deficits, and carotid endarterectomy presents emergency department with dizziness.  Since Wednesday patient has been having intermittent dizziness.  Says that she has not been able to identify any exacerbating or alleviating factors.  Says that today she was driving and the dizziness got worse and it felt like the world was spinning around her.  Decided to come into the emergency department for evaluation.  Denies any diplopia, dysphagia, or dysarthria.  No syncopal events.       Home Medications Prior to Admission medications   Medication Sig Start Date End Date Taking? Authorizing Provider  amLODipine (NORVASC) 10 MG tablet Take 1 tablet by mouth once daily 10/25/22   Lewayne Bunting, MD  aspirin EC 81 MG tablet Take 1 tablet (81 mg total) by mouth daily. Swallow whole. 10/13/21   Lewayne Bunting, MD  carboxymethylcellulose (REFRESH PLUS) 0.5 % SOLN Apply to eye.    [provider]  ezetimibe (ZETIA) 10 MG tablet Take 1 tablet (10 mg total) by mouth daily. 04/26/22 04/21/23  Lewayne Bunting, MD  FIBER ADULT GUMMIES PO Take 1 tablet by mouth daily.    [provider]  gabapentin (NEURONTIN) 300 MG capsule Take 1 capsule (300 mg total) by mouth 3 (three) times daily as needed (nerve pain). 04/14/22   Rodolph Bong, MD  metoprolol succinate (TOPROL-XL) 100 MG 24 hr tablet TAKE 1 TABLET BY MOUTH ONCE DAILY OR  IMMEDIATELY  FOLLOWING  A  MEAL Patient taking differently: Take 100 mg by mouth daily. 08/29/22   Lewayne Bunting, MD  nitroGLYCERIN (NITROSTAT) 0.4 MG SL tablet Place 1 tablet (0.4 mg total) under the tongue every 5 (five) minutes as needed for chest  pain. 1 tablet under tongue every 5 minutes as needed for chest discomfort (max 2 Tab/day) 04/08/21   Myrlene Broker, MD  Potassium Chloride ER 20 MEQ TBCR TAKE 1 TABLET BY MOUTH ON MONDAY, WEDNESDAY, FRIDAY AND EVERY SUNDAY 11/09/22   Myrlene Broker, MD  rosuvastatin (CRESTOR) 40 MG tablet Take 1 tablet by mouth once daily 06/23/22   Lewayne Bunting, MD  traMADol (ULTRAM) 50 MG tablet Take 1 tablet (50 mg total) by mouth every 12 (twelve) hours as needed. 06/26/22 06/26/23  Magnant, Charles L, PA-C  triamcinolone cream (KENALOG) 0.1 % Apply 1 application. topically 2 (two) times daily. 12/08/21   Myrlene Broker, MD  Vitamin D, Ergocalciferol, 50000 units CAPS Take 1 capsule by mouth 4 (four) times a week. 07/22/12   [provider]      Allergies    Adhesive [tape] and Latex    Review of Systems   Review of Systems  Physical Exam Updated Vital Signs BP 113/72 (BP Location: Right Arm)   Pulse (!) 53   Temp 97.9 F (36.6 C) (Oral)   Resp 14   Ht 5\' 3"  (1.6 m)   Wt 68 kg   LMP  (LMP Unknown)   SpO2 99%   BMI 26.57 kg/m  Physical Exam Vitals and nursing note reviewed.  Constitutional:      General: She is not in acute distress.    Appearance: She  is well-developed.  HENT:     Head: Normocephalic and atraumatic.     Right Ear: External ear normal.     Left Ear: External ear normal.     Nose: Nose normal.  Eyes:     Extraocular Movements: Extraocular movements intact.     Conjunctiva/sclera: Conjunctivae normal.     Pupils: Pupils are equal, round, and reactive to light.  Cardiovascular:     Rate and Rhythm: Normal rate and regular rhythm.  Pulmonary:     Effort: Pulmonary effort is normal. No respiratory distress.  Abdominal:     General: Abdomen is flat.  Musculoskeletal:     Cervical back: Normal range of motion and neck supple.     Right lower leg: No edema.     Left lower leg: No edema.  Skin:    General: Skin is warm and dry.   Neurological:     Mental Status: She is alert.     Comments: MENTAL STATUS: AAOx3 CRANIAL NERVES: II: Pupils equal and reactive 4 mm BL, no RAPD, no VF deficits III, IV, VI: EOM intact, no gaze preference or deviation, no nystagmus. V: normal sensation to light touch in V1, V2, and V3 segments bilaterally VII: no facial weakness or asymmetry, no nasolabial fold flattening VIII: normal hearing to speech and finger friction IX, X: normal palatal elevation, no uvular deviation XI: 5/5 head turn and 5/5 shoulder shrug bilaterally XII: midline tongue protrusion MOTOR: 5/5 strength in R shoulder flexion, elbow flexion and extension, and grip strength. 5/5 strength in L shoulder flexion, elbow flexion and extension, and grip strength.  5/5 strength in R hip and knee flexion, knee extension, ankle plantar and dorsiflexion. 5/5 strength in L hip and knee flexion, knee extension, ankle plantar and dorsiflexion. SENSORY: Normal sensation to light touch in all extremities COORD: Normal finger to nose and heel to shin, no tremor, no dysmetria STATION: normal stance, no truncal ataxia GAIT: Mild ataxia   Psychiatric:        Mood and Affect: Mood normal.     ED Results / Procedures / Treatments   Labs (all labs ordered are listed, but only abnormal results are displayed) Labs Reviewed  BASIC METABOLIC PANEL - Abnormal; Notable for the following components:      Result Value   Glucose, Bld 106 (*)    All other components within normal limits  CBC - Abnormal; Notable for the following components:   Platelets 149 (*)    All other components within normal limits  URINALYSIS, ROUTINE W REFLEX MICROSCOPIC - Abnormal; Notable for the following components:   Color, Urine STRAW (*)    All other components within normal limits  CBG MONITORING, ED    EKG EKG Interpretation  Date/Time:  Saturday Jan 13 2023 11:41:14 EDT Ventricular Rate:  54 PR Interval:  162 QRS Duration: 88 QT  Interval:  452 QTC Calculation: 428 R Axis:   69 Text Interpretation: Sinus bradycardia Possible Left atrial enlargement Left ventricular hypertrophy with repolarization abnormality ( Sokolow-Lyon ) Abnormal ECG When compared with ECG of 01-Mar-2018 22:32, PREVIOUS ECG IS PRESENT Confirmed by Vonita Moss [phone]) on 01/13/2023 2:40:33 PM  Radiology CT ANGIO HEAD NECK W WO CM  Result Date: 01/13/2023 CLINICAL DATA:  Vertigo, central. EXAM: CT HEAD WITHOUT CONTRAST CT ANGIOGRAPHY OF THE HEAD AND NECK TECHNIQUE: Contiguous axial images were obtained from the base of the skull through the vertex without intravenous contrast. Multidetector CT imaging of the head and neck was  performed using the standard protocol during bolus administration of intravenous contrast. Multiplanar CT image reconstructions and MIPs were obtained to evaluate the vascular anatomy. Carotid stenosis measurements (when applicable) are obtained utilizing NASCET criteria, using the distal internal carotid diameter as the denominator. RADIATION DOSE REDUCTION: This exam was performed according to the departmental dose-optimization program which includes automated exposure control, adjustment of the mA and/or kV according to patient size and/or use of iterative reconstruction technique. CONTRAST:  75mL OMNIPAQUE IOHEXOL 350 MG/ML SOLN COMPARISON:  MRI brain 01/13/2023. FINDINGS: CT HEAD Brain: Small, somewhat linear focus of hypoattenuation in the right occipital lobe (coronal image 58 series 7) without definite correlate on the same day brain MRI is favored to reflect cortical mineralization rather than punctate hemorrhage. Gray-white differentiation is preserved. Scattered foci of hypoattenuation in the cerebral white matter, most consistent with mild chronic small-vessel disease. No hydrocephalus or extra-axial collection. No mass effect or midline shift. Vascular: No hyperdense vessel or unexpected calcification. Skull: No calvarial  fracture or suspicious bone lesion. Skull base is unremarkable. Sinuses/Orbits: Unremarkable. CTA NECK Aortic arch: Three-vessel arch configuration. Atherosclerotic calcifications of the aortic arch and arch vessel origins. Densely calcified plaque results in moderate-to-severe stenosis of the right brachiocephalic and left common carotid artery origins. Mild stenosis of the left subclavian artery origin. Right carotid system: Moderate stenosis of the distal right CCA. Densely calcified plaque at the carotid bulb and right ICA origin results in at least 50% stenosis, though degree of stenosis is likely underestimated due to blooming artifact and diminutive size of the distal right ICA. Left carotid system: Mild atherosclerotic changes of the mid to distal left CCA, carotid bulb and proximal left cervical ICA without hemodynamically significant stenosis. Vertebral arteries:Left dominant. Mild stenosis of the left vertebral artery origin. Both vertebral arteries are otherwise patent to the confluence with the basilar. Skeleton: Mild cervical spondylosis without high-grade spinal canal stenosis. Other neck: Unremarkable. CTA HEAD Anterior circulation: Calcified plaque along the carotid siphons without hemodynamically significant stenosis. The proximal ACAs and MCAs are patent without stenosis or aneurysm. Distal branches are symmetric. Posterior circulation: Normal basilar artery. The SCAs, AICAs and PICAs are patent proximally. The PCAs are patent proximally without stenosis or aneurysm. Distal branches are symmetric. Venous sinuses: As permitted by early phase of contrast, patent. Anatomic variants: None. IMPRESSION: CT HEAD: 1. No definite acute intracranial abnormality. 2. Small, somewhat linear focus of hypoattenuation in the right occipital lobe without definite correlate on the same day brain MRI is favored to reflect cortical mineralization rather than punctate hemorrhage. 3. Mild chronic small-vessel disease.  CTA HEAD: No large vessel occlusion or hemodynamically significant stenosis. CTA NECK: 1. Moderate-to-severe stenosis of the right brachiocephalic and left common carotid artery origins. 2. Densely calcified plaque at the right carotid bulb and ICA origin results in at least 50% stenosis, though degree of stenosis is likely underestimated due to blooming artifact and diminutive size of the distal right ICA. 3. Mild stenosis of the left vertebral artery origin. 4. Aortic Atherosclerosis (ICD10-I70.0). Electronically Signed   By: Orvan Falconer M.D.   On: 01/13/2023 18:20   MR BRAIN WO CONTRAST  Result Date: 01/13/2023 CLINICAL DATA:  Dizziness EXAM: MRI HEAD WITHOUT CONTRAST TECHNIQUE: Multiplanar, multiecho pulse sequences of the brain and surrounding structures were obtained without intravenous contrast. COMPARISON:  04/16/2021 FINDINGS: Brain: Diffusion imaging shows an equivocal punctate acute or subacute infarction in the right basal ganglia, axial image 76. I do not think this is a definite finding. No  abnormality is seen affecting the brainstem or cerebellum. Cerebral hemispheres show mild to low moderate chronic small-vessel ischemic changes of the white matter. There are old small cortical infarctions in the right occipital lobe and right parietal lobe. No large vessel infarction. No mass, hemorrhage, hydrocephalus or extra-axial collection. Vascular: Major vessels at the base of the brain show flow. Skull and upper cervical spine: Negative Sinuses/Orbits: Clear/normal Other: None IMPRESSION: 1. Equivocal punctate acute or subacute infarction in the right basal ganglia. 2. Chronic small-vessel ischemic changes of the cerebral hemispheric white matter. Old small cortical infarctions in the right occipital lobe and right parietal lobe as seen previously. Electronically Signed   By: Paulina Fusi M.D.   On: 01/13/2023 16:08    Procedures Procedures   Medications Ordered in ED Medications  lactated  ringers bolus 500 mL (500 mLs Intravenous Not Given 01/13/23 1817)  meclizine (ANTIVERT) tablet 25 mg (25 mg Oral Given 01/13/23 1452)  iohexol (OMNIPAQUE) 350 MG/ML injection 75 mL (75 mLs Intravenous Contrast Given 01/13/23 1800)    ED Course/ Medical Decision Making/ A&P Clinical Course as of 01/13/23 2013  Sat Jan 13, 2023  1616 MR BRAIN WO CONTRAST 1. Equivocal punctate acute or subacute infarction in the right basal ganglia. 2. Chronic small-vessel ischemic changes of the cerebral hemispheric white matter. Old small cortical infarctions in the right occipital lobe and right parietal lobe as seen previously.   [HN]  1616 Basal ganglia stroke could explain balance issues/dizziness. Consulted to neurology. [HN]  1634 D/w Dr. Wilford Corner neurology and after his review of imaging he states that she is unlikely to have R basal ganglia stroke and if she does, unlikely to correlate with her symptoms. Recommends CTA H&N, fluids, diazepam. [HN]  1730 Patient was extremely rude to nursing several times.  Went to discussed with patient who was extremely rude to me.  Called me a "smartass" and yelled over me when I was trying to answer questions.  She was wondering what the delay was between when the MRI was done and giving her results.  I informed her that it takes time for radiology to read the scans and additionally I had to speak with neurology about a questionable finding on the MRI and wanted to give her complete information.  Patient extremely unhappy about the wait time here and would like to go home.  I discussed with her the recommendations from neurology.  She actually has completely resolved dizziness after the meclizine and wonders if she can follow-up outpatient.  She also made a appointment with cardiology for Monday.  I recommend still going through with the CT scan based on the findings on the MRI as well as her symptoms earlier but if she wanted to follow-up as an outpatient I believe that is also  reasonable given that her symptoms are completely resolved after the meclizine.  Patient states she will still move forward with the CT scan in order to get it done but then would like to be discharged before the results are completed, stating that I can just call her if anything is abnormal. I relayed that that isn't typically how we function in the ED because there could be emergent findings on the CT scan that require immediate intervention but I believe the chances of that are low given her symptoms have resolved. Patient and her daughter understand this risk. Patient will be DC'd w/ o/p f/u, DC instructions, return precautions. [HN]    Clinical Course User Index [HN] Vivi Barrack  Dorris Carnes, MD                            Medical Decision Making Amount and/or Complexity of Data Reviewed Labs: ordered. Radiology: ordered. Decision-making details documented in ED Course.  Risk Prescription drug management.   Brenda [name] is a 67 y.o. female with comorbidities that complicate the patient evaluation including stroke without residual deficits, carotid endarterectomy, and CAD status post CABG who presents emergency department with dizziness  Initial Ddx:  Posterior circulation stroke, peripheral vertigo, orthostasis  MDM:  Concerned about possible posterior circulation stroke given the patient's description of symptoms.  Could also be related to peripheral vertigo but with her difficulty walking and risk factors will obtain MRI at this time to ensure that she has not had a stroke.  Does not appear to be clearly consistent with orthostasis.  Plan:  Labs Meclizine MRI  ED Summary/Re-evaluation:  Patient's labs returned and were unremarkable.  Was feeling better after the meclizine.  Signed out to the oncoming physician awaiting her MRI.  This patient presents to the ED for concern of complaints listed in HPI, this involves an extensive number of treatment options, and is a complaint that carries  with it a high risk of complications and morbidity. Disposition including potential need for admission considered.   Dispo: Pending remainder of workup  Additional history obtained from daughter Records reviewed Outpatient Clinic Notes The following labs were independently interpreted: Chemistry and show no acute abnormality I personally reviewed and interpreted the pt's EKG: see above for interpretation  I have reviewed the patients home medications and made adjustments as needed  Final Clinical Impression(s) / ED Diagnoses Final diagnoses:  Dizziness    Rx / DC Orders ED Discharge Orders     None         Rondel Baton, MD 01/13/23 2013

## 2023-01-16 ENCOUNTER — Encounter: Payer: Self-pay | Admitting: Nurse Practitioner

## 2023-01-16 ENCOUNTER — Ambulatory Visit: Payer: Medicare Other | Attending: Nurse Practitioner | Admitting: Nurse Practitioner

## 2023-01-16 VITALS — BP 124/78 | HR 59 | Ht 63.0 in | Wt 154.6 lb

## 2023-01-16 DIAGNOSIS — I2510 Atherosclerotic heart disease of native coronary artery without angina pectoris: Secondary | ICD-10-CM | POA: Diagnosis not present

## 2023-01-16 DIAGNOSIS — E785 Hyperlipidemia, unspecified: Secondary | ICD-10-CM

## 2023-01-16 DIAGNOSIS — Z8673 Personal history of transient ischemic attack (TIA), and cerebral infarction without residual deficits: Secondary | ICD-10-CM

## 2023-01-16 DIAGNOSIS — R42 Dizziness and giddiness: Secondary | ICD-10-CM

## 2023-01-16 DIAGNOSIS — I10 Essential (primary) hypertension: Secondary | ICD-10-CM

## 2023-01-16 DIAGNOSIS — I6523 Occlusion and stenosis of bilateral carotid arteries: Secondary | ICD-10-CM | POA: Diagnosis not present

## 2023-01-16 NOTE — Patient Instructions (Signed)
Medication Instructions:  Your physician recommends that you continue on your current medications as directed. Please refer to the Current Medication list given to you today.  Lab Work: NONE ordered at this time of appointment   If you have labs (blood work) drawn today and your tests are completely normal, you will receive your results only by: MyChart Message (if you have MyChart) OR A paper copy in the mail If you have any lab test that is abnormal or we need to change your treatment, we will call you to review the results.   Testing/Procedures: Your physician has requested that you have an echocardiogram. Echocardiography is a painless test that uses sound waves to create images of your heart. It provides your doctor with information about the size and shape of your heart and how well your heart's chambers and valves are working. This procedure takes approximately one hour. There are no restrictions for this procedure. Please do NOT wear cologne, perfume, aftershave, or lotions (deodorant is allowed). Please arrive 15 minutes prior to your appointment time.    Follow-Up: At [facility], you and your health needs are our priority.  As part of our continuing mission to provide you with exceptional heart care, we have created designated Provider Care Teams.  These Care Teams include your primary Cardiologist (physician) and Advanced Practice Providers (APPs -  Physician Assistants and Nurse Practitioners) who all work together to provide you with the care you need, when you need it.  We recommend signing up for the patient portal called "MyChart".  Sign up information is provided on this After Visit Summary.  MyChart is used to connect with patients for Virtual Visits (Telemedicine).  Patients are able to view lab/test results, encounter notes, upcoming appointments, etc.  Non-urgent messages can be sent to your provider as well.   To learn more about what you can do with MyChart,  go to [email or web address].    Your next appointment:    Keep Follow up   Provider:   Olga Millers, MD     Other Instructions

## 2023-01-16 NOTE — Progress Notes (Signed)
Office Visit    Patient Name: Brenda [name] Date of Encounter: 01/16/2023  Primary Care Provider:  Myrlene Broker, MD Primary Cardiologist:  Olga Millers, MD  Chief Complaint   67 year old female with a history of CAD s/p CABG x 5 in 2001 (LIMA-LAD, sequential SVG-distal RCA and PDA, sequential SVG-ramus and OM), carotid artery disease s/p carotid endarterectomy, hypertension, hyperlipidemia, CVA, PAD and breast cancer s/p bilateral mastectomies who presents for hospital follow-up related to chest pain and dizziness.  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, Grade 2, Stage 0 (ER/PR +);  11-07-2018  s/p  bilateral mastectomies   Carotid artery stenosis    bilateral---  04-27-2000  s/p  left carotid endarterectomy:  last doppler in epic 08-13-2018  right ICA 40-59%,  right ECA >50%,  left ICA 1-39% ,  bilateral subclavians stenotic   CHF (congestive heart failure) (HCC)    Chronic stable angina    followed by cardiology   Complication of anesthesia    PONV   Coronary artery disease cardiologist-- dr Jens Som   06-12-2000  s/p  CABG x5  :  cardiac cath 02-21-2018 --  normal LVF, 80% LM, occluded RCA, 95% Ramus and 100% OM,  Patent seqSVG to  PLA-PDA & LIMA to LAD,  seqSVG to OM and RI occluded   Depression    Family history of lung cancer    History of blood transfusion    History of CVA with residual deficit    short term memory loss per pt   History of Helicobacter pylori infection 2001   Hyperlipidemia    Hypertension    Hyperthyroidism    dx yrs ago, per pt have taken medication for awhile   Mild mitral regurgitation    per echo 06/ 2019   PAD (peripheral artery disease) (HCC)     Peripheral vascular disease (HCC)    managed by vascular-- dr Randie Heinz   PONV (postoperative nausea and vomiting)    S/P CABG x 5 06-12-2000   by dr Zenaida Niece tright @[facility]    Severe obstructive sleep apnea-hypopnea syndrome 02/25/2021   Stroke (HCC) 2002   mild memory loss   Past Surgical History:  Procedure Laterality Date   ABDOMINAL AORTOGRAM W/LOWER EXTREMITY N/A 06/13/2017   Procedure: ABDOMINAL AORTOGRAM W/LOWER EXTREMITY;  Surgeon: Maeola Harman, MD;  Location: [facility] INVASIVE CV LAB;  Service: Cardiovascular;  Laterality: N/A;  Bilateral   ABDOMINAL HYSTERECTOMY  1998   BREAST LUMPECTOMY WITH AXILLARY LYMPH NODE DISSECTION Left 1999   CARDIAC CATHETERIZATION  04-24-2000  dr Graciela Husbands   3V CAD , subtotal PDA w/ slow flow colleterals , ef 48%   CARDIAC CATHETERIZATION  06/11/2000   3V CAD with progression of LAD disease   CAROTID ENDARTERECTOMY Left 04-27-2000   dr Madilyn Fireman  @[facility]    COLONOSCOPY  06/24/2019   [facility]   CORONARY ARTERY BYPASS GRAFT  06-12-2000   dr Zenaida Niece tright @[facility]    x 5 (left internal mammary artery to left anterior  descending coronary artery   LEFT HEART CATH AND CORS/GRAFTS ANGIOGRAPHY N/A 02/21/2018   Procedure: LEFT HEART CATH AND CORS/GRAFTS ANGIOGRAPHY;  Surgeon:  Runell Gess, MD;  Location: [facility] INVASIVE CV LAB;  Service: Cardiovascular;  Laterality: N/A;   PERIPHERAL VASCULAR INTERVENTION  06/13/2017   Procedure: PERIPHERAL VASCULAR INTERVENTION;  Surgeon: Maeola Harman, MD;  Location: [facility] INVASIVE CV LAB;  Service: Cardiovascular;;  Bilateral Iliac Stents   ROBOTIC ASSISTED SALPINGO OOPHERECTOMY Bilateral 03/13/2019   Procedure: XI ROBOTIC ASSISTED BILATERAL  SALPINGO OOPHORECTOMY;  Surgeon: Adolphus Birchwood, MD;  Location: [facility];  Service: Gynecology;  Laterality: Bilateral;   TOTAL MASTECTOMY Bilateral 11/07/2018   Procedure: BILATERAL MASTECTOMIES;  Surgeon: Almond Lint, MD;  Location: [facility];  Service: General;  Laterality:  Bilateral;    Allergies  Allergies  Allergen Reactions   Adhesive [Tape] Itching   Latex Rash     Labs/Other Studies Reviewed    The following studies were reviewed today: Echo 2020: IMPRESSIONS    1. The left ventricle has normal systolic function with an ejection  fraction of 60-65%. The cavity size was normal. There is mildly increased  left ventricular wall thickness. Left ventricular diastolic Doppler  parameters are consistent with impaired  relaxation. Elevated mean left atrial pressure.   2. The interatrial septum is aneurysmal.   3. The right ventricle has normal systolic function. The cavity was  normal.   4. The mitral valve is abnormal. Mild thickening of the mitral valve  leaflet.   5. The tricuspid valve is grossly normal.   6. The aortic valve is tricuspid. Mild thickening of the aortic valve.  Mild calcification of the aortic valve. No stenosis of the aortic valve.   7. Normal LV systolic function; mild LVH; mild diastolic dysfunction;  mild MR.   Echo 2022: IMPRESSIONS    1. Left ventricular ejection fraction, by estimation, is >75%. The left  ventricle has hyperdynamic function. The left ventricle has no regional  wall motion abnormalities. There is moderate concentric left ventricular  hypertrophy. Left ventricular  diastolic parameters are indeterminate.   2. Right ventricular systolic function is normal. The right ventricular  size is normal. Tricuspid regurgitation signal is inadequate for assessing  PA pressure.   3. Left atrial size was moderately dilated.   4. The mitral valve is abnormal. Mild mitral valve regurgitation. No  evidence of mitral stenosis. There is mild holosystolic prolapse of  anterior leaflet of the mitral valve.   5. The aortic valve is tricuspid. There is moderate calcification of the  aortic valve. Aortic valve regurgitation is not visualized. Mild aortic  valve sclerosis is present, with no evidence of aortic valve  stenosis.   6. The inferior vena cava is normal in size with greater than 50%  respiratory variability, suggesting right atrial pressure of 3 mmHg.   Comparison(s): Prior images reviewed side by side.   Conclusion(s)/Recommendation(s): Otherwise normal echocardiogram, with  minor abnormalities described in the report. Borderline mitral valve  prolapse of anterior leaflet with mild MR, similar to prior. Hyperdynamic  LV with small LV caivty size.   Carotid ultrasound 08/2022: Summary:  Right Carotid: Velocities in the right ICA are consistent with a 1-39%  stenosis. Bi-directional flow in the ICA consistent with more proximal stenosis/occlusion. Non-hemodynamically significant plaque <50% noted in the CCA.   Left Carotid: Velocities in the left ICA are consistent with a 1-39%  stenosis.   Vertebrals: Left vertebral artery demonstrates antegrade flow. Right  vertebral artery demonstrates retrograde flow.  Subclavians: Left subclavian artery was stenotic. Right subclavian artery  Flow was disturbed. Innominate artery stenosis. Correlate  with arm claudication.   *See table(s) above for measurements and observations.  Suggest follow up study in 12 months.    Recent Labs: 08/08/2022: ALT 30 01/13/2023: BUN 11; Creatinine, Ser 0.83; Hemoglobin 14.2; Platelets 149; Potassium 4.0; Sodium 137  Recent Lipid Panel    Component Value Date/Time   CHOL 128 08/08/2022 0948   TRIG 62 08/08/2022 0948   HDL 57 08/08/2022 0948   CHOLHDL 2.2 08/08/2022 0948   CHOLHDL 3 05/28/2019 1113   VLDL 18.0 05/28/2019 1113   LDLCALC 58 08/08/2022 0948   LDLDIRECT 151.5 04/03/2013 0832    History of Present Illness    67 year old female with the above past medical history including CAD s/p CABG x 5 in 2001 (LIMA-LAD, sequential SVG-distal RCA and PDA, sequential SVG-ramus and OM), carotid artery disease s/p carotid endarterectomy, hypertension, hyperlipidemia, CVA, PAD, PADand breast cancer s/p bilateral  mastectomies.  She underwent CABG x 5 in 2001.  She also had carotid endarterectomy at the time.  Renal Dopplers in January 2012 showed normal renal arteries, no abdominal aortic aneurysm.  Cardiac catheterization in June 2019 showed normal LV function, 80% left main, occluded RCA, 95% ramus, with a percent occluded obtuse marginal, patent SVG-PLA-PDA, LIMA-LAD.  Sequential saphenous vein graft to the OM and ramus were occluded.  Medical therapy was recommended. Echocardiogram in 2022 showed EF greater than 75%, moderate LVH, moderate left atrial enlargement, mild mitral valve regurgitation, prolapse of the anterior mitral valve leaflet.  Carotid Dopplers in December 2022 showed 1 to 39% R ICA stenosis, patent left carotid endarterectomy, bilateral subclavian stenosis.  ABIs in 2023 was normal.  She was last seen in the office on 04/24/2022 and was stable from a cardiac standpoint.  She denied symptoms concerning for angina.  Repeat carotid ultrasound in 08/2022 revealed 1-39% B ICA stenosis, bilateral subclavian artery stenosis.  She presented to the ED on 01/13/2023 with dizziness and chest pain.  MRI revealed equivocal punctuate acute or subacute infarction in the right basal ganglia, chronic vessel ischemic changes, old small cortical infarctions in the right occipital lobe and right parietal lobe.  Neurology was consulted and reviewed imaging and felt that patient was unlikely to have had a right basal ganglia stroke.  Follow-up CTA was unremarkable.  Her dizziness improved with meclizine.  She left prior to further evaluation.  She presents today for follow-up.  Since her recent hospital visit she has been stable overall from a cardiac standpoint.  She notes that with these episodes of dizziness she had some chest tightness which she largely attributes to anxiety.  She denies any recurrent chest discomfort, denies dizziness, denies presyncope, syncope, palpitations, dyspnea.  She notes that she was told her  symptoms were likely related to vertigo.  She also notes that she has been under a significant mount of personal stress lately.  Other than her recent episodes of dizziness, she reports feeling well.  Home Medications    Current Outpatient Medications  Medication Sig Dispense Refill   amLODipine (NORVASC) 10 MG tablet Take 1 tablet by mouth once daily 90 tablet 1   aspirin EC 81 MG tablet Take 1 tablet (81 mg total) by mouth daily. Swallow whole. 90 tablet 3   carboxymethylcellulose (REFRESH PLUS) 0.5 % SOLN Apply to eye.     ezetimibe (ZETIA) 10 MG tablet Take 1 tablet (10 mg total) by mouth daily. 90 tablet 3   FIBER ADULT GUMMIES PO Take 1 tablet by mouth daily.     gabapentin (NEURONTIN)  300 MG capsule Take 1 capsule (300 mg total) by mouth 3 (three) times daily as needed (nerve pain). 90 capsule 3   metoprolol succinate (TOPROL-XL) 100 MG 24 hr tablet TAKE 1 TABLET BY MOUTH ONCE DAILY OR  IMMEDIATELY  FOLLOWING  A  MEAL (Patient taking differently: Take 100 mg by mouth daily.) 90 tablet 2   nitroGLYCERIN (NITROSTAT) 0.4 MG SL tablet Place 1 tablet (0.4 mg total) under the tongue every 5 (five) minutes as needed for chest pain. 1 tablet under tongue every 5 minutes as needed for chest discomfort (max 2 Tab/day) 25 tablet 4   Potassium Chloride ER 20 MEQ TBCR TAKE 1 TABLET BY MOUTH ON MONDAY, WEDNESDAY, FRIDAY AND EVERY SUNDAY 45 tablet 0   rosuvastatin (CRESTOR) 40 MG tablet Take 1 tablet by mouth once daily 90 tablet 3   traMADol (ULTRAM) 50 MG tablet Take 1 tablet (50 mg total) by mouth every 12 (twelve) hours as needed. (Patient not taking: Reported on 01/16/2023) 20 tablet 0   triamcinolone cream (KENALOG) 0.1 % Apply 1 application. topically 2 (two) times daily. (Patient not taking: Reported on 01/16/2023) 100 g 0   Vitamin D, Ergocalciferol, 50000 units CAPS Take 1 capsule by mouth 4 (four) times a week.     No current facility-administered medications for this visit.     Review of  Systems    She denies chest pain, palpitations, dyspnea, pnd, orthopnea, n, v, dizziness, syncope, edema, weight gain, or early satiety. All other systems reviewed and are otherwise negative except as noted above.   Physical Exam    VS:  BP 124/78 (BP Location: Right Arm, Patient Position: Sitting, Cuff Size: Normal)   Pulse (!) 59   Ht 5\' 3"  (1.6 m)   Wt 154 lb 9.6 oz (70.1 kg)   LMP  (LMP Unknown)   SpO2 98%   BMI 27.39 kg/m  GEN: Well nourished, well developed, in no acute distress. HEENT: normal. Neck: Supple, no JVD, carotid bruits, or masses. Cardiac: RRR, no murmurs, rubs, or gallops. No clubbing, cyanosis, edema.  Radials/DP/PT 2+ and equal bilaterally.  Respiratory:  Respirations regular and unlabored, clear to auscultation bilaterally. GI: Soft, nontender, nondistended, BS + x 4. MS: no deformity or atrophy. Skin: warm and dry, no rash. Neuro:  Strength and sensation are intact. Psych: Normal affect.  Accessory Clinical Findings    ECG personally reviewed by me today -sinus bradycardia, 59 bpm, LVH- no acute changes.   Lab Results  Component Value Date   WBC 7.5 01/13/2023   HGB 14.2 01/13/2023   HCT 41.8 01/13/2023   MCV 94.1 01/13/2023   PLT 149 (L) 01/13/2023   Lab Results  Component Value Date   CREATININE 0.83 01/13/2023   BUN 11 01/13/2023   NA 137 01/13/2023   K 4.0 01/13/2023   CL 102 01/13/2023   CO2 25 01/13/2023   Lab Results  Component Value Date   ALT 30 08/08/2022   AST 26 08/08/2022   ALKPHOS 75 08/08/2022   BILITOT 1.0 08/08/2022   Lab Results  Component Value Date   CHOL 128 08/08/2022   HDL 57 08/08/2022   LDLCALC 58 08/08/2022   LDLDIRECT 151.5 04/03/2013   TRIG 62 08/08/2022   CHOLHDL 2.2 08/08/2022    Lab Results  Component Value Date   HGBA1C 5.8 (H) 01/31/2021    Assessment & Plan    1. CAD:  S/p CABG x 5 in 2001 (LIMA-LAD, sequential SVG-distal RCA and PDA,  sequential SVG-ramus and OM.  Recent episodes of  dizziness, likely vertigo, with associated chest tightness.  She attributed her chest tightness to anxiety.  She has not had any further chest tightness outside of these recent episodes of dizziness. She is active, exercises regularly, she denies any exertional symptoms concerning for angina. Through shared decision making, will defer ischemic evaluation at this time.  Advised her to notify us should she have more frequent or persistent episodes of chest tightness, or any exertional symptoms.  Continue aspirin, amlodipine, metoprolol, Crestor, and Zetia.  2. Carotid artery disease: S/p L CEA in 2001. Repeat carotid ultrasound in 08/2022 revealed 1-39% B ICA stenosis, bilateral subclavian artery stenosis.  Recent CTA of the head/neck was stable.  Consider repeat ultrasound in 08/2023.  Continue ASA, Crestor.   3. History of CVA/Dizziness: Recent ED visit in the setting of dizziness, gait instability.  MRI revealed equivocal punctuate acute or subacute infarction in the right basal ganglia, chronic vessel ischemic changes, old small cortical infarctions in the right occipital lobe and right parietal lobe.  Neurology was consulted and reviewed imaging and felt that patient was unlikely to have had a right basal ganglia stroke. Follow-up CTA was unremarkable. Dizziness improved with meclizine.  She denies any further dizziness, denies palpitations, presyncope, syncope.  Suspect vertigo. Will repeat echo.  Discussed ED precautions. Recommend follow-up with PCP given likely vertigo.   4. Hypertension: BP well controlled. Continue current antihypertensive regimen.   5. Hyperlipidemia: LDL was 58 in 07/2022. Continue Crestor.   6. History of PAD: History of bilateral CIA stents.  ABIs in 04/2022 were normal.  Follows with vascular surgery.  7. Disposition: Follow-up as scheduled with Dr. Jens Som in 04/2023.      Joylene Grapes, NP 01/16/2023, 2:12 PM

## 2023-01-24 ENCOUNTER — Encounter: Payer: Self-pay | Admitting: Internal Medicine

## 2023-01-24 ENCOUNTER — Ambulatory Visit (INDEPENDENT_AMBULATORY_CARE_PROVIDER_SITE_OTHER): Payer: Medicare Other | Admitting: Internal Medicine

## 2023-01-24 VITALS — BP 112/62 | HR 69 | Temp 98.9°F | Ht 63.0 in | Wt 155.0 lb

## 2023-01-24 DIAGNOSIS — R42 Dizziness and giddiness: Secondary | ICD-10-CM

## 2023-01-24 NOTE — Progress Notes (Signed)
   Subjective:   Patient ID: Brenda [name], female    DOB: 1956-01-22, 67 y.o.   MRN: [id number]  Dizziness Pertinent negatives include no abdominal pain, chest pain, coughing, nausea or vomiting.   The patient is a 67 YO female coming in for follow up ER (in for dizziness, had MRI and CTA and no change from prior). Given meclizine and dizziness abated. She has noticed trigger of being outdoors prior to episodes.   Review of Systems  Constitutional: Negative.   HENT: Negative.    Eyes: Negative.   Respiratory:  Negative for cough, chest tightness and shortness of breath.   Cardiovascular:  Negative for chest pain, palpitations and leg swelling.  Gastrointestinal:  Negative for abdominal distention, abdominal pain, constipation, diarrhea, nausea and vomiting.  Musculoskeletal: Negative.   Skin: Negative.   Neurological:  Positive for dizziness.  Psychiatric/Behavioral: Negative.      Objective:  Physical Exam Constitutional:      Appearance: She is well-developed.  HENT:     Head: Normocephalic and atraumatic.  Cardiovascular:     Rate and Rhythm: Normal rate and regular rhythm.  Pulmonary:     Effort: Pulmonary effort is normal. No respiratory distress.     Breath sounds: Normal breath sounds. No wheezing or rales.  Abdominal:     General: Bowel sounds are normal. There is no distension.     Palpations: Abdomen is soft.     Tenderness: There is no abdominal tenderness. There is no rebound.  Musculoskeletal:     Cervical back: Normal range of motion.  Skin:    General: Skin is warm and dry.  Neurological:     Mental Status: She is alert and oriented to person, place, and time.     Coordination: Coordination normal.     Vitals:   01/24/23 0919  BP: 112/62  Pulse: 69  Temp: 98.9 F (37.2 C)  TempSrc: Oral  SpO2: 96%  Weight: 155 lb (70.3 kg)  Height: 5\' 3"  (1.6 m)    Assessment & Plan:  Visit time 20 minutes in face to face communication with patient and  coordination of care, additional 10 minutes spent in record review, coordination or care, ordering tests, communicating/referring to other healthcare professionals, documenting in medical records all on the same day of the visit for total time 30 minutes spent on the visit.

## 2023-01-24 NOTE — Patient Instructions (Signed)
We will have you try claritin or zyrtec daily to help.  It is okay to use the meclizine as needed for dizziness.

## 2023-01-24 NOTE — Assessment & Plan Note (Signed)
Suspect she may have allergen induced dizziness. Advised to resume zyrtec daily to help. Ears clear on exam. Imaging reviewed and stable from prior. Cardiology will consider repeat carotid imaging Dec 2024. Okay to use meclizine prn otc. Advised not to take regularly as this can cause dizziness if taken too much. Also given information about epley maneuver to try

## 2023-02-12 ENCOUNTER — Ambulatory Visit (HOSPITAL_COMMUNITY): Payer: Medicare Other | Attending: Nurse Practitioner

## 2023-02-12 DIAGNOSIS — Z951 Presence of aortocoronary bypass graft: Secondary | ICD-10-CM | POA: Diagnosis not present

## 2023-02-12 DIAGNOSIS — I10 Essential (primary) hypertension: Secondary | ICD-10-CM

## 2023-02-12 DIAGNOSIS — I110 Hypertensive heart disease with heart failure: Secondary | ICD-10-CM | POA: Insufficient documentation

## 2023-02-12 DIAGNOSIS — I509 Heart failure, unspecified: Secondary | ICD-10-CM | POA: Insufficient documentation

## 2023-02-12 DIAGNOSIS — R42 Dizziness and giddiness: Secondary | ICD-10-CM | POA: Diagnosis not present

## 2023-02-12 DIAGNOSIS — E785 Hyperlipidemia, unspecified: Secondary | ICD-10-CM | POA: Insufficient documentation

## 2023-02-12 DIAGNOSIS — I2510 Atherosclerotic heart disease of native coronary artery without angina pectoris: Secondary | ICD-10-CM | POA: Insufficient documentation

## 2023-02-12 DIAGNOSIS — G4733 Obstructive sleep apnea (adult) (pediatric): Secondary | ICD-10-CM | POA: Diagnosis not present

## 2023-02-12 LAB — ECHOCARDIOGRAM COMPLETE
Area-P 1/2: 3.78 cm2
S' Lateral: 2 cm

## 2023-02-20 ENCOUNTER — Telehealth: Payer: Self-pay

## 2023-02-20 NOTE — Telephone Encounter (Signed)
Lmom to discuss echo results. Waiting on a return call.  

## 2023-03-08 ENCOUNTER — Telehealth: Payer: Self-pay | Admitting: Hematology and Oncology

## 2023-03-08 NOTE — Telephone Encounter (Signed)
Patient is aware of upcoming appointment times/dtes

## 2023-03-14 ENCOUNTER — Other Ambulatory Visit: Payer: Self-pay | Admitting: Internal Medicine

## 2023-03-14 MED ORDER — POTASSIUM CHLORIDE ER 20 MEQ PO TBCR: EXTENDED_RELEASE_TABLET | ORAL | 0 refills | Status: DC

## 2023-03-23 ENCOUNTER — Encounter: Payer: Self-pay | Admitting: Cardiology

## 2023-03-23 DIAGNOSIS — Z79899 Other long term (current) drug therapy: Secondary | ICD-10-CM

## 2023-03-23 MED ORDER — AMLODIPINE BESYLATE 10 MG PO TABS
10.0000 mg | ORAL_TABLET | Freq: Every day | ORAL | 1 refills | Status: DC
Start: 2023-03-23 — End: 2023-08-16

## 2023-04-19 NOTE — Progress Notes (Deleted)
HPI:  FU coronary artery disease and cerebrovascular disease. Patient is status post coronary artery bypass graft in 2001 (LIMA to the LAD, sequential saphenous vein graft to the distal right coronary and PDA, sequential saphenous vein graft to the ramus and obtuse marginal). She also had carotid endarterectomy at that time as well. Renal Dopplers in January of 2012 showed normal renal arteries and no abdominal aortic aneurysm. Cardiac catheterization June 2019 showed normal LV function, 80% left main, occluded right coronary artery, 95% ramus and 100% obtuse marginal.  Sequential saphenous vein graft to the PLA-PDA and LIMA to the LAD patent. Sequential saphenous vein graft to the obtuse marginal and ramus intermedius occluded.  Medical therapy recommended. Peripheral vascular disease followed by vascular surgery. ABIs August 2023 normal.  Carotid Dopplers December 2023 showed 1 to 39% right with bidirectional flow in the internal carotid artery consistent with more proximal stenosis/occlusion and 1 to 39% left stenosis.  There was left subclavian stenosis.  CTA May 2024 showed moderate to severe stenosis of the right brachiocephalic and left common carotid artery origins, densely calcified plaque at the right carotid bulb and internal carotid artery resulting in less than 50% stenosis and mild stenosis of the left vertebral.  Echocardiogram June 2024 showed normal LV function, grade 1 diastolic dysfunction.  Since last seen   Current Outpatient Medications  Medication Sig Dispense Refill   amLODipine (NORVASC) 10 MG tablet Take 1 tablet (10 mg total) by mouth daily. 90 tablet 1   aspirin EC 81 MG tablet Take 1 tablet (81 mg total) by mouth daily. Swallow whole. 90 tablet 3   carboxymethylcellulose (REFRESH PLUS) 0.5 % SOLN Apply to eye.     ezetimibe (ZETIA) 10 MG tablet Take 1 tablet (10 mg total) by mouth daily. 90 tablet 3   FIBER ADULT GUMMIES PO Take 1 tablet by mouth daily.     gabapentin  (NEURONTIN) 300 MG capsule Take 1 capsule (300 mg total) by mouth 3 (three) times daily as needed (nerve pain). 90 capsule 3   metoprolol succinate (TOPROL-XL) 100 MG 24 hr tablet TAKE 1 TABLET BY MOUTH ONCE DAILY OR  IMMEDIATELY  FOLLOWING  A  MEAL (Patient taking differently: Take 100 mg by mouth daily.) 90 tablet 2   nitroGLYCERIN (NITROSTAT) 0.4 MG SL tablet Place 1 tablet (0.4 mg total) under the tongue every 5 (five) minutes as needed for chest pain. 1 tablet under tongue every 5 minutes as needed for chest discomfort (max 2 Tab/day) 25 tablet 4   Potassium Chloride ER 20 MEQ TBCR TAKE 1 TABLET BY MOUTH ON MONDAY, WEDNESDAY, FRIDAY AND EVERY SUNDAY 45 tablet 0   rosuvastatin (CRESTOR) 40 MG tablet Take 1 tablet by mouth once daily 90 tablet 3   traMADol (ULTRAM) 50 MG tablet Take 1 tablet (50 mg total) by mouth every 12 (twelve) hours as needed. (Patient not taking: Reported on 01/16/2023) 20 tablet 0   triamcinolone cream (KENALOG) 0.1 % Apply 1 application. topically 2 (two) times daily. (Patient not taking: Reported on 01/16/2023) 100 g 0   Vitamin D, Ergocalciferol, 50000 units CAPS Take 1 capsule by mouth 4 (four) times a week.     No current facility-administered medications for this visit.     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, Grade 2, Stage 0 (ER/PR +);  11-07-2018  s/p  bilateral mastectomies   Carotid artery stenosis    bilateral---  04-27-2000  s/p  left carotid endarterectomy:  last doppler in epic 08-13-2018  right ICA 40-59%,  right ECA >50%,  left ICA 1-39% ,  bilateral subclavians stenotic   CHF (congestive heart failure) (HCC)    Chronic stable angina    followed by cardiology   Complication of anesthesia     PONV   Coronary artery disease cardiologist-- dr Jens Som   06-12-2000  s/p  CABG x5  :  cardiac cath 02-21-2018 --  normal LVF, 80% LM, occluded RCA, 95% Ramus and 100% OM,  Patent seqSVG to  PLA-PDA & LIMA to LAD,  seqSVG to OM and RI occluded   Depression    Family history of lung cancer    History of blood transfusion    History of CVA with residual deficit    short term memory loss per pt   History of Helicobacter pylori infection 2001   Hyperlipidemia    Hypertension    Hyperthyroidism    dx yrs ago, per pt have taken medication for awhile   Mild mitral regurgitation    per echo 06/ 2019   PAD (peripheral artery disease) (HCC)    Peripheral vascular disease (HCC)    managed by vascular-- dr Randie Heinz   PONV (postoperative nausea and vomiting)    S/P CABG x 5 06-12-2000   by dr Zenaida Niece tright @[facility]    Severe obstructive sleep apnea-hypopnea syndrome 02/25/2021   Stroke (HCC) 2002   mild memory loss    Past Surgical History:  Procedure Laterality Date   ABDOMINAL AORTOGRAM W/LOWER EXTREMITY N/A 06/13/2017   Procedure: ABDOMINAL AORTOGRAM W/LOWER EXTREMITY;  Surgeon: Maeola Harman, MD;  Location[facility] INVASIVE CV LAB;  Service: Cardiovascular;  Laterality: N/A;  Bilateral   ABDOMINAL HYSTERECTOMY  1998   BREAST LUMPECTOMY WITH AXILLARY LYMPH NODE DISSECTION Left 1999   CARDIAC CATHETERIZATION  04-24-2000  dr Graciela Husbands   3V CAD , subtotal PDA w/ slow flow colleterals , ef 48%   CARDIAC CATHETERIZATION  06/11/2000   3V CAD with progression of LAD disease   CAROTID ENDARTERECTOMY Left 04-27-2000   dr Madilyn Fireman  @[facility]    COLONOSCOPY  06/24/2019   [facility]   CORONARY ARTERY BYPASS GRAFT  06-12-2000   dr Zenaida Niece tright @[facility]    x 5 (left internal mammary artery to left anterior  descending coronary artery   LEFT HEART CATH AND CORS/GRAFTS ANGIOGRAPHY N/A 02/21/2018   Procedure: LEFT HEART CATH AND CORS/GRAFTS ANGIOGRAPHY;  Surgeon: Runell Gess, MD;  Location: [facility] INVASIVE CV LAB;  Service:  Cardiovascular;  Laterality: N/A;   PERIPHERAL VASCULAR INTERVENTION  06/13/2017   Procedure: PERIPHERAL VASCULAR INTERVENTION;  Surgeon: Maeola Harman, MD;  Location: [facility] INVASIVE CV LAB;  Service: Cardiovascular;;  Bilateral Iliac Stents   ROBOTIC ASSISTED SALPINGO OOPHERECTOMY Bilateral 03/13/2019   Procedure: XI ROBOTIC ASSISTED BILATERAL  SALPINGO OOPHORECTOMY;  Surgeon: Adolphus Birchwood, MD;  Location: [facility];  Service: Gynecology;  Laterality: Bilateral;   TOTAL MASTECTOMY Bilateral 11/07/2018   Procedure: BILATERAL MASTECTOMIES;  Surgeon: Almond Lint, MD;  Location: [facility];  Service: General;  Laterality: Bilateral;    Social History   Socioeconomic History   Marital status: Single    Spouse  name: Not on file   Number of children: 1   Years of education: Not on file   Highest education level: Not on file  Occupational History   Occupation: Child psychotherapist, retired from [employer]    Employer: UNEMPLOYED  Tobacco Use   Smoking status: Former    Current packs/day: 0.00    Types: Cigarettes    Start date: 09/11/1984    Quit date: 09/11/2001    Years since quitting: 21.6   Smokeless tobacco: Never  Vaping Use   Vaping status: Never Used  Substance and Sexual Activity   Alcohol use: Not Currently    Comment: Quit 1998   Drug use: No   Sexual activity: Not on file    Comment: Hysterectomy  Other Topics Concern   Not on file  Social History Narrative   Not on file   Social Determinants of Health   Financial Resource Strain: Low Risk  (05/26/2022)   Overall Financial Resource Strain (CARDIA)    Difficulty of Paying Living Expenses: Not hard at all  Food Insecurity: No Food Insecurity (05/26/2022)   Hunger Vital Sign    Worried About Running Out of Food in the Last Year: Never true    Ran Out of Food in the Last Year: Never true  Transportation Needs: No Transportation Needs (05/26/2022)   PRAPARE - Administrator, Civil Service  (Medical): No    Lack of Transportation (Non-Medical): No  Physical Activity: Sufficiently Active (05/26/2022)   Exercise Vital Sign    Days of Exercise per Week: 3 days    Minutes of Exercise per Session: 60 min  Stress: No Stress Concern Present (05/26/2022)   [employer] of Occupational Health - Occupational Stress Questionnaire    Feeling of Stress : Not at all  Social Connections: Moderately Integrated (05/26/2022)   Social Connection and Isolation Panel [NHANES]    Frequency of Communication with Friends and Family: More than three times a week    Frequency of Social Gatherings with Friends and Family: More than three times a week    Attends Religious Services: More than 4 times per year    Active Member of [employer] or Organizations: Yes    Attends Engineer, structural: More than 4 times per year    Marital Status: Never married  Intimate Partner Violence: Not At Risk (05/26/2022)   Humiliation, Afraid, Rape, and Kick questionnaire    Fear of Current or Ex-Partner: No    Emotionally Abused: No    Physically Abused: No    Sexually Abused: No    Family History  Problem Relation Age of Onset   Lung cancer Mother 50       died at an early age   Other Other        There is no Hx of premature coronary artery disease in her family   Colon cancer Neg Hx    Rectal cancer Neg Hx    Stomach cancer Neg Hx     ROS: no fevers or chills, productive cough, hemoptysis, dysphasia, odynophagia, melena, hematochezia, dysuria, hematuria, rash, seizure activity, orthopnea, PND, pedal edema, claudication. Remaining systems are negative.  Physical Exam: Well-developed well-nourished in no acute distress.  Skin is warm and dry.  HEENT is normal.  Neck is supple.  Chest is clear to auscultation with normal expansion.  Cardiovascular exam is regular rate and rhythm.  Abdominal exam nontender or distended. No masses palpated. Extremities show no edema. neuro grossly intact  ECG-  personally reviewed  A/P  1 coronary artery disease-patient denies chest pain.  Continue aspirin and statin.  2 carotid artery disease-patient will need follow-up carotid Dopplers May 2025.  3 hypertension-blood pressure controlled.  Continue present medications and follow.  4 hyperlipidemia-continue statin.  5 peripheral vascular disease-followed by vascular surgery.   Olga Millers, MD

## 2023-04-23 ENCOUNTER — Other Ambulatory Visit: Payer: Medicare Other

## 2023-04-23 ENCOUNTER — Ambulatory Visit: Payer: Medicare Other | Admitting: Hematology and Oncology

## 2023-04-23 NOTE — Progress Notes (Signed)
HPI: FU coronary artery disease and cerebrovascular disease. Patient is status post coronary artery bypass graft in 2001 (LIMA to the LAD, sequential saphenous vein graft to the distal right coronary and PDA, sequential saphenous vein graft to the ramus and obtuse marginal). She also had carotid endarterectomy at that time as well. Renal Dopplers in January of 2012 showed normal renal arteries and no abdominal aortic aneurysm. Cardiac catheterization June 2019 showed normal LV function, 80% left main, occluded right coronary artery, 95% ramus and 100% obtuse marginal.  Sequential saphenous vein graft to the PLA-PDA and LIMA to the LAD patent. Sequential saphenous vein graft to the obtuse marginal and ramus intermedius occluded.  Medical therapy recommended. Peripheral vascular disease followed by vascular surgery. ABIs August 2023 normal. Carotid Dopplers May 2024 showed moderate to severe stenosis of the right brachiocephalic and left common carotid artery origins. Echocardiogram June 2024 showed normal LV function, grade 1 diastolic dysfunction.  Since last seen there is no dyspnea on exertion or exertional chest pain.  No syncope.  Current Outpatient Medications  Medication Sig Dispense Refill   amLODipine (NORVASC) 10 MG tablet Take 1 tablet (10 mg total) by mouth daily. 90 tablet 1   aspirin EC 81 MG tablet Take 1 tablet (81 mg total) by mouth daily. Swallow whole. 90 tablet 3   carboxymethylcellulose (REFRESH PLUS) 0.5 % SOLN Apply to eye.     ezetimibe (ZETIA) 10 MG tablet Take 1 tablet (10 mg total) by mouth daily. 90 tablet 3   FIBER ADULT GUMMIES PO Take 1 tablet by mouth daily.     gabapentin (NEURONTIN) 300 MG capsule Take 1 capsule (300 mg total) by mouth 3 (three) times daily as needed (nerve pain). 90 capsule 3   metoprolol succinate (TOPROL-XL) 100 MG 24 hr tablet TAKE 1 TABLET BY MOUTH ONCE DAILY OR  IMMEDIATELY  FOLLOWING  A  MEAL (Patient taking differently: Take 100 mg by mouth  daily.) 90 tablet 2   nitroGLYCERIN (NITROSTAT) 0.4 MG SL tablet Place 1 tablet (0.4 mg total) under the tongue every 5 (five) minutes as needed for chest pain. 1 tablet under tongue every 5 minutes as needed for chest discomfort (max 2 Tab/day) 25 tablet 4   Potassium Chloride ER 20 MEQ TBCR TAKE 1 TABLET BY MOUTH ON MONDAY, WEDNESDAY, FRIDAY AND EVERY SUNDAY 45 tablet 0   rosuvastatin (CRESTOR) 40 MG tablet Take 1 tablet by mouth once daily 90 tablet 3   Vitamin D, Ergocalciferol, 50000 units CAPS Take 1 capsule by mouth 4 (four) times a week.     traMADol (ULTRAM) 50 MG tablet Take 1 tablet (50 mg total) by mouth every 12 (twelve) hours as needed. (Patient not taking: Reported on 05/01/2023) 20 tablet 0   triamcinolone cream (KENALOG) 0.1 % Apply 1 application. topically 2 (two) times daily. (Patient not taking: Reported on 05/01/2023) 100 g 0   No current facility-administered medications for this visit.     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, Grade 2, Stage 0 (ER/PR +);  11-07-2018  s/p  bilateral mastectomies   Carotid artery stenosis    bilateral---  04-27-2000  s/p  left carotid endarterectomy:  last doppler in epic 08-13-2018  right ICA 40-59%,  right ECA >50%,  left ICA 1-39% ,  bilateral subclavians stenotic   CHF (congestive heart failure) (HCC)    Chronic stable angina    followed by cardiology   Complication of anesthesia    PONV   Coronary artery disease cardiologist-- dr Jens Som   06-12-2000  s/p  CABG x5  :  cardiac cath 02-21-2018 --  normal LVF, 80% LM, occluded RCA, 95% Ramus and 100% OM,  Patent seqSVG to  PLA-PDA & LIMA to LAD,  seqSVG to OM and RI occluded   Depression    Family history of lung cancer     History of blood transfusion    History of CVA with residual deficit    short term memory loss per pt   History of Helicobacter pylori infection 2001   Hyperlipidemia    Hypertension    Hyperthyroidism    dx yrs ago, per pt have taken medication for awhile   Mild mitral regurgitation    per echo 06/ 2019   PAD (peripheral artery disease) (HCC)    Peripheral vascular disease (HCC)    managed by vascular-- dr Randie Heinz   PONV (postoperative nausea and vomiting)    S/P CABG x 5 06-12-2000   by dr Zenaida Niece tright @[facility]    Severe obstructive sleep apnea-hypopnea syndrome 02/25/2021   Stroke (HCC) 2002   mild memory loss    Past Surgical History:  Procedure Laterality Date   ABDOMINAL AORTOGRAM W/LOWER EXTREMITY N/A 06/13/2017   Procedure: ABDOMINAL AORTOGRAM W/LOWER EXTREMITY;  Surgeon: Maeola Harman, MD;  Location: [facility] INVASIVE CV LAB;  Service: Cardiovascular;  Laterality: N/A;  Bilateral   ABDOMINAL HYSTERECTOMY  1998   BREAST LUMPECTOMY WITH AXILLARY LYMPH NODE DISSECTION Left 1999   CARDIAC CATHETERIZATION  04-24-2000  dr Graciela Husbands   3V CAD , subtotal PDA w/ slow flow colleterals , ef 48%   CARDIAC CATHETERIZATION  06/11/2000   3V CAD with progression of LAD disease   CAROTID ENDARTERECTOMY Left 04-27-2000   dr Madilyn Fireman  @[facility]    COLONOSCOPY  06/24/2019   [facility]   CORONARY ARTERY BYPASS GRAFT  06-12-2000   dr Zenaida Niece tright @[facility]    x 5 (left internal mammary artery to left anterior  descending coronary artery   LEFT HEART CATH AND CORS/GRAFTS ANGIOGRAPHY N/A 02/21/2018   Procedure: LEFT HEART CATH AND CORS/GRAFTS ANGIOGRAPHY;  Surgeon: Runell Gess, MD;  Location: [facility] INVASIVE CV LAB;  Service: Cardiovascular;  Laterality: N/A;   PERIPHERAL VASCULAR INTERVENTION  06/13/2017   Procedure: PERIPHERAL VASCULAR INTERVENTION;  Surgeon: Maeola Harman, MD;  Location: [facility] INVASIVE CV LAB;  Service: Cardiovascular;;  Bilateral Iliac Stents   ROBOTIC ASSISTED SALPINGO OOPHERECTOMY Bilateral  03/13/2019   Procedure: XI ROBOTIC ASSISTED BILATERAL  SALPINGO OOPHORECTOMY;  Surgeon: Adolphus Birchwood, MD;  Location: [facility] [place];  Service: Gynecology;  Laterality: Bilateral;   TOTAL MASTECTOMY Bilateral 11/07/2018   Procedure: BILATERAL MASTECTOMIES;  Surgeon: Almond Lint, MD;  Location: [facility];  Service: General;  Laterality: Bilateral;    Social History   Socioeconomic History   Marital status: Single    Spouse name: Not on file   Number of children: 1   Years of education: Not on file   Highest education level: Not on file  Occupational History   Occupation: Child psychotherapist, 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  Tobacco Use   Smoking status: Former  Current packs/day: 0.00    Types: Cigarettes    Start date: 09/11/1984    Quit date: 09/11/2001    Years since quitting: 21.6   Smokeless tobacco: Never  Vaping Use   Vaping status: Never Used  Substance and Sexual Activity   Alcohol use: Not Currently    Comment: Quit 1998   Drug use: No   Sexual activity: Not on file    Comment: Hysterectomy  Other Topics Concern   Not on file  Social History Narrative   Not on file   Social Determinants of Health   Financial Resource Strain: Low Risk  (05/26/2022)   Overall Financial Resource Strain (CARDIA)    Difficulty of Paying Living Expenses: Not hard at all  Food Insecurity: No Food Insecurity (05/26/2022)   Hunger Vital Sign    Worried About Running Out of Food in the Last Year: Never true    Ran Out of Food in the Last Year: Never true  Transportation Needs: No Transportation Needs (05/26/2022)   PRAPARE - Administrator, Civil Service (Medical): No    Lack of Transportation (Non-Medical): No  Physical Activity: Sufficiently Active (05/26/2022)   Exercise Vital Sign    Days of Exercise per Week: 3 days    Minutes of Exercise per Session: 60 min  Stress: No Stress Concern Present (05/26/2022)   [employer] of Occupational  Health - Occupational Stress Questionnaire    Feeling of Stress : Not at all  Social Connections: Moderately Integrated (05/26/2022)   Social Connection and Isolation Panel [NHANES]    Frequency of Communication with Friends and Family: More than three times a week    Frequency of Social Gatherings with Friends and Family: More than three times a week    Attends Religious Services: More than 4 times per year    Active Member of [employer] or Organizations: Yes    Attends Engineer, structural: More than 4 times per year    Marital Status: Never married  Intimate Partner Violence: Not At Risk (05/26/2022)   Humiliation, Afraid, Rape, and Kick questionnaire    Fear of Current or Ex-Partner: No    Emotionally Abused: No    Physically Abused: No    Sexually Abused: No    Family History  Problem Relation Age of Onset   Lung cancer Mother 65       died at an early age   Other Other        There is no Hx of premature coronary artery disease in her family   Colon cancer Neg Hx    Rectal cancer Neg Hx    Stomach cancer Neg Hx     ROS: no fevers or chills, productive cough, hemoptysis, dysphasia, odynophagia, melena, hematochezia, dysuria, hematuria, rash, seizure activity, orthopnea, PND, pedal edema, claudication. Remaining systems are negative.  Physical Exam: Well-developed well-nourished in no acute distress.  Skin is warm and dry.  HEENT is normal.  Neck is supple.  Chest is clear to auscultation with normal expansion.  Cardiovascular exam is regular rate and rhythm.  Abdominal exam nontender or distended. No masses palpated. Extremities show no edema. neuro grossly intact  A/P  1 coronary artery disease-patient denies chest pain.  Continue aspirin and statin.  2 carotid artery disease-history of carotid endarterectomy.  Plan follow-up carotid Dopplers May 2025.  3 hyperlipidemia-continue statin.  Check lipids and liver.  4 hypertension-patient's blood pressure is  controlled today.  Continue present medical regimen.  Check  potassium and renal function.  5 peripheral vascular disease-patient is followed by vascular surgery.  Olga Millers, MD

## 2023-04-26 ENCOUNTER — Encounter (INDEPENDENT_AMBULATORY_CARE_PROVIDER_SITE_OTHER): Payer: Self-pay

## 2023-04-27 ENCOUNTER — Ambulatory Visit: Payer: Medicare Other | Admitting: Cardiology

## 2023-05-01 ENCOUNTER — Ambulatory Visit: Payer: Medicare Other | Attending: Cardiology | Admitting: Cardiology

## 2023-05-01 ENCOUNTER — Encounter: Payer: Self-pay | Admitting: Cardiology

## 2023-05-01 VITALS — BP 102/74 | HR 54 | Ht 64.0 in | Wt 154.8 lb

## 2023-05-01 DIAGNOSIS — E785 Hyperlipidemia, unspecified: Secondary | ICD-10-CM

## 2023-05-01 DIAGNOSIS — I10 Essential (primary) hypertension: Secondary | ICD-10-CM | POA: Diagnosis not present

## 2023-05-01 DIAGNOSIS — I6523 Occlusion and stenosis of bilateral carotid arteries: Secondary | ICD-10-CM | POA: Diagnosis not present

## 2023-05-01 DIAGNOSIS — I2510 Atherosclerotic heart disease of native coronary artery without angina pectoris: Secondary | ICD-10-CM

## 2023-05-01 MED ORDER — NITROGLYCERIN 0.4 MG SL SUBL: 0.4000 mg | SUBLINGUAL_TABLET | SUBLINGUAL | 4 refills | Status: AC | PRN

## 2023-05-01 NOTE — Patient Instructions (Signed)

## 2023-05-02 LAB — COMPREHENSIVE METABOLIC PANEL
ALT: 28 IU/L (ref 0–32)
AST: 29 IU/L (ref 0–40)
Albumin: 4.1 g/dL (ref 3.9–4.9)
Alkaline Phosphatase: 74 IU/L (ref 44–121)
BUN/Creatinine Ratio: 16 (ref 12–28)
BUN: 14 mg/dL (ref 8–27)
Bilirubin Total: 0.8 mg/dL (ref 0.0–1.2)
CO2: 24 mmol/L (ref 20–29)
Calcium: 9.4 mg/dL (ref 8.7–10.3)
Chloride: 105 mmol/L (ref 96–106)
Creatinine, Ser: 0.88 mg/dL (ref 0.57–1.00)
Globulin, Total: 2.9 g/dL (ref 1.5–4.5)
Glucose: 102 mg/dL — ABNORMAL HIGH (ref 70–99)
Potassium: 4.4 mmol/L (ref 3.5–5.2)
Sodium: 140 mmol/L (ref 134–144)
Total Protein: 7 g/dL (ref 6.0–8.5)
eGFR: 71 mL/min/{1.73_m2} (ref >59)

## 2023-05-02 LAB — LIPID PANEL
Chol/HDL Ratio: 2.1 ratio (ref 0.0–4.4)
Cholesterol, Total: 105 mg/dL (ref 100–199)
HDL: 51 mg/dL (ref >39)
LDL Chol Calc (NIH): 41 mg/dL (ref 0–99)
Triglycerides: 54 mg/dL (ref 0–149)
VLDL Cholesterol Cal: 13 mg/dL (ref 5–40)

## 2023-05-04 ENCOUNTER — Encounter: Payer: Self-pay | Admitting: *Deleted

## 2023-05-11 ENCOUNTER — Inpatient Hospital Stay (HOSPITAL_BASED_OUTPATIENT_CLINIC_OR_DEPARTMENT_OTHER): Payer: Medicare Other | Admitting: Hematology and Oncology

## 2023-05-11 ENCOUNTER — Inpatient Hospital Stay: Payer: Medicare Other | Attending: Hematology and Oncology

## 2023-05-11 ENCOUNTER — Other Ambulatory Visit: Payer: Self-pay | Admitting: *Deleted

## 2023-05-11 DIAGNOSIS — Z87891 Personal history of nicotine dependence: Secondary | ICD-10-CM | POA: Insufficient documentation

## 2023-05-11 DIAGNOSIS — Z1509 Genetic susceptibility to other malignant neoplasm: Secondary | ICD-10-CM | POA: Diagnosis not present

## 2023-05-11 DIAGNOSIS — Z9013 Acquired absence of bilateral breasts and nipples: Secondary | ICD-10-CM | POA: Diagnosis not present

## 2023-05-11 DIAGNOSIS — Z1501 Genetic susceptibility to malignant neoplasm of breast: Secondary | ICD-10-CM | POA: Insufficient documentation

## 2023-05-11 DIAGNOSIS — Z923 Personal history of irradiation: Secondary | ICD-10-CM | POA: Insufficient documentation

## 2023-05-11 DIAGNOSIS — C50112 Malignant neoplasm of central portion of left female breast: Secondary | ICD-10-CM

## 2023-05-11 DIAGNOSIS — Z9221 Personal history of antineoplastic chemotherapy: Secondary | ICD-10-CM | POA: Insufficient documentation

## 2023-05-11 DIAGNOSIS — D0512 Intraductal carcinoma in situ of left breast: Secondary | ICD-10-CM | POA: Insufficient documentation

## 2023-05-11 DIAGNOSIS — Z90710 Acquired absence of both cervix and uterus: Secondary | ICD-10-CM | POA: Insufficient documentation

## 2023-05-11 NOTE — Progress Notes (Signed)
[facility]  Telephone:([phone] Fax:([phone]) [phone]    ID: Brenda [name] DOB: April 09, 1956  MR#: [id number]  CSN#:[id number]  Patient Care Team: Myrlene Broker, MD as PCP - General (Internal Medicine) Jens Som Madolyn Frieze, MD as PCP - Cardiology (Cardiology) Adrian Prince, MD as Consulting Physician (Endocrinology) Almond Lint, MD as Consulting Physician (General Surgery) Glenna Fellows, MD as Consulting Physician (Plastic Surgery) Charna Elizabeth, MD as Consulting Physician (Gastroenterology) Adolphus Birchwood, MD as Consulting Physician (Gynecologic Oncology) Kathyrn Sheriff, [facility] (Inactive) as Pharmacist (Pharmacist) Dohmeier, Porfirio Mylar, MD as Consulting Physician (Neurology) Pershing Proud, RN as Oncology Nurse Navigator Donnelly Angelica, RN as Oncology Nurse Navigator Rachel Moulds, MD as Consulting Physician (Hematology and Oncology)  CHIEF COMPLAINT: PALB2 POSITIVE ductal carcinoma in situ, estrogen receptor positive (s/p bilateral mastectomies)  CURRENT TREATMENT: Observation  INTERVAL HISTORY:  Devanee was seen today for follow-up of her 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 She was tearful but calmed down, She denies any changes in her breast. She saw plastic surgeon in the past but she was not impressed by his bedside manner.  She is not keen to see another Engineer, petroleum at this time.  She denies any change in breathing, bowel habits.  She recently had a cardiology appointment and had some labs.  Rest of the pertinent 10 point ROS reviewed and negative   COVID 19 VACCINATION STATUS: Pfizer x4, last 02/2021   HISTORY OF CURRENT ILLNESS: From the original intake note:  Brenda [name] has a remote history of breast cancer, and she underwent a lumpectomy of the left breast in 1999.  She does not remember the size of the tumor  but knows that 8 lymph nodes were removed and that they were all clear.  She was treated with chemotherapy and radiation.  She does not recall other details.  She did not receive antiestrogens  More recently, on 08/12/2018 Ayviana had routine screening mammography showing a possible abnormality in the left breast. She underwent unilateral left diagnostic mammography with tomography at [facility] on 08/15/2018 showing: Breast Density Category B. Spot compression magnification views were performed over the upper outer far posterior left breast. There are grouped microcalcifications in the region of the lumpectomy site in the far upper outer posterior left breast varying in shape, size, and density, spanning approximately 2.7 cm.   Accordingly on 08/19/2018 she proceeded to biopsy of the left breast area in question. The pathology from this procedure showed ([id number]): ductal carcinoma in situ, intermediate nuclear grade with calcifications.  Prognostic indicators significant for: estrogen receptor, 100% positive and progesterone receptor, 90% positive, both with strong staining intensity.   The patient's subsequent history is as detailed above.   PAST MEDICAL HISTORY: Past Medical History:  Diagnosis Date   Alcoholism [facility])    Quit 1998   Allergy    Anemia    Anxiety    Breast cancer, left [facility]) oncologist-- dr Darnelle Catalan   dx 1999,  DCIS;  s/p  left lumpectomy w/ node dissection's;  completed chemo and radiation 2000:  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, Grade 2, Stage 0 (ER/PR +);  11-07-2018  s/p  bilateral mastectomies   Carotid artery stenosis    bilateral---  04-27-2000  s/p  left carotid endarterectomy:  last doppler in epic 08-13-2018  right ICA 40-59%,  right ECA >50%,  left ICA 1-39% ,  bilateral subclavians stenotic   CHF (congestive heart failure) (HCC)    Chronic stable angina    followed by cardiology   Complication of anesthesia    PONV   Coronary artery disease  cardiologist-- dr Jens Som   06-12-2000  s/p  CABG x5  :  cardiac cath 02-21-2018 --  normal LVF, 80% LM, occluded RCA, 95% Ramus and 100% OM,  Patent seqSVG to  PLA-PDA & LIMA to LAD,  seqSVG to OM and RI occluded   Depression    Family history of lung cancer    History of blood transfusion    History of CVA with residual deficit    short term memory loss per pt   History of Helicobacter pylori infection 2001   Hyperlipidemia    Hypertension    Hyperthyroidism    dx yrs ago, per pt have taken medication for awhile   Mild mitral regurgitation    per echo 06/ 2019   PAD (peripheral artery disease) (HCC)    Peripheral vascular disease (HCC)    managed by vascular-- dr Randie Heinz   PONV (postoperative nausea and vomiting)    S/P CABG x 5 06-12-2000   by dr Zenaida Niece tright @[facility]    Severe obstructive sleep apnea-hypopnea syndrome 02/25/2021   Stroke (HCC) 2002   mild memory loss    PAST SURGICAL HISTORY: Past Surgical History:  Procedure Laterality Date   ABDOMINAL AORTOGRAM W/LOWER EXTREMITY N/A 06/13/2017   Procedure: ABDOMINAL AORTOGRAM W/LOWER EXTREMITY;  Surgeon: Maeola Harman, MD;  Location: [facility] INVASIVE CV LAB;  Service: Cardiovascular;  Laterality: N/A;  Bilateral   ABDOMINAL HYSTERECTOMY  1998   BREAST LUMPECTOMY WITH AXILLARY LYMPH NODE DISSECTION Left 1999   CARDIAC CATHETERIZATION  04-24-2000  dr Graciela Husbands   3V CAD , subtotal PDA w/ slow flow colleterals , ef 48%   CARDIAC CATHETERIZATION  06/11/2000   3V CAD with progression of LAD disease   CAROTID ENDARTERECTOMY Left 04-27-2000   dr Madilyn Fireman  @[facility]    COLONOSCOPY  06/24/2019   [facility]   CORONARY ARTERY BYPASS GRAFT  06-12-2000   dr Zenaida Niece tright @[facility]    x 5 (left internal mammary artery to left anterior  descending coronary artery   LEFT HEART CATH AND CORS/GRAFTS ANGIOGRAPHY N/A 02/21/2018   Procedure: LEFT HEART CATH AND CORS/GRAFTS ANGIOGRAPHY;  Surgeon: Runell Gess, MD;  Location: [facility] INVASIVE CV LAB;  Service: Cardiovascular;   Laterality: N/A;   PERIPHERAL VASCULAR INTERVENTION  06/13/2017   Procedure: PERIPHERAL VASCULAR INTERVENTION;  Surgeon: Maeola Harman, MD;  Location: [facility] INVASIVE CV LAB;  Service: Cardiovascular;;  Bilateral Iliac Stents   ROBOTIC ASSISTED SALPINGO OOPHERECTOMY Bilateral 03/13/2019   Procedure: XI ROBOTIC ASSISTED BILATERAL  SALPINGO OOPHORECTOMY;  Surgeon: Adolphus Birchwood, MD;  Location: [facility];  Service: Gynecology;  Laterality: Bilateral;   TOTAL MASTECTOMY Bilateral 11/07/2018   Procedure: BILATERAL MASTECTOMIES;  Surgeon: Almond Lint, MD;  Location:  SURGERY CENTER;  Service: General;  Laterality: Bilateral;    FAMILY HISTORY: Family History  Problem Relation Age of Onset   Lung cancer Mother 59       died at an early age   Other Other        There is no Hx of premature coronary artery disease in her family   Colon cancer Neg Hx    Rectal cancer Neg Hx    Stomach cancer Neg Hx   Chasitty's has no information on her father.  The  patient's mother died when she was 70 years old and the patient was put in foster care and partly raised by great aunts. The patient has 1 sister, but she does not know her sister's medical history.   GYNECOLOGIC HISTORY:  No LMP recorded (lmp unknown). Patient has had a hysterectomy. Menarche age 80, first live birth age 3, the patient is GX P1.  She had hysterectomy around age 14.  She did not receive hormone replacement.  She did not have bilateral salpingo-oophorectomy   SOCIAL HISTORY: (updated 10/25/2018) Irving Burton grew up in a foster home given that her mother died so young.  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  Daughter Prudy Feeler, age 58, works for the [employer] but currently is under [employer] because of an injury inflicted by a Consulting civil engineer.  The patient has 2 granddaughters, age 76 and 53 as  of January 2019.  At home is just the patient. Shakila's sister lives outside of [place], [place].    ADVANCED DIRECTIVES: Not in place   HEALTH MAINTENANCE: Social History   Tobacco Use   Smoking status: Former    Current packs/day: 0.00    Types: Cigarettes    Start date: 09/11/1984    Quit date: 09/11/2001    Years since quitting: 21.6   Smokeless tobacco: Never  Vaping Use   Vaping status: Never Used  Substance Use Topics   Alcohol use: Not Currently    Comment: Quit 1998   Drug use: No    Colonoscopy: January 2012  PAP: Status post hysterectomy  Bone density: May 2012 at [place], T score -0.5   Allergies  Allergen Reactions   Adhesive [Tape] Itching   Latex Rash    Current Outpatient Medications  Medication Sig Dispense Refill   amLODipine (NORVASC) 10 MG tablet Take 1 tablet (10 mg total) by mouth daily. 90 tablet 1   aspirin EC 81 MG tablet Take 1 tablet (81 mg total) by mouth daily. Swallow whole. 90 tablet 3   carboxymethylcellulose (REFRESH PLUS) 0.5 % SOLN Apply to eye.     ezetimibe (ZETIA) 10 MG tablet Take 1 tablet (10 mg total) by mouth daily. 90 tablet 3   FIBER ADULT GUMMIES PO Take 1 tablet by mouth daily.     gabapentin (NEURONTIN) 300 MG capsule Take 1 capsule (300 mg total) by mouth 3 (three) times daily as needed (nerve pain). 90 capsule 3   metoprolol succinate (TOPROL-XL) 100 MG 24 hr tablet TAKE 1 TABLET BY MOUTH ONCE DAILY OR  IMMEDIATELY  FOLLOWING  A  MEAL (Patient taking differently: Take 100 mg by mouth daily.) 90 tablet 2   nitroGLYCERIN (NITROSTAT) 0.4 MG SL tablet Place 1 tablet (0.4 mg total) under the tongue every 5 (five) minutes as needed for chest pain. 1 tablet under tongue every 5 minutes as needed for chest discomfort (max 2 Tab/day) 25 tablet 4   Potassium Chloride ER 20 MEQ TBCR TAKE 1 TABLET BY MOUTH ON MONDAY, WEDNESDAY, FRIDAY AND EVERY SUNDAY 45 tablet 0   rosuvastatin (CRESTOR) 40 MG tablet Take 1 tablet by mouth once daily 90 tablet 3    traMADol (ULTRAM) 50 MG tablet Take 1 tablet (50 mg total) by mouth every 12 (twelve) hours as needed. (Patient not taking: Reported on 05/01/2023) 20 tablet 0   triamcinolone cream (KENALOG) 0.1 % Apply 1 application. topically  2 (two) times daily. (Patient not taking: Reported on 05/01/2023) 100 g 0   Vitamin D, Ergocalciferol, 50000 units CAPS Take 1 capsule by mouth 4 (four) times a week.     No current facility-administered medications for this visit.     OBJECTIVE: African-American woman in no acute distress  There were no vitals filed for this visit.     There is no height or weight on file to calculate BMI.   Wt Readings from Last 3 Encounters:  05/01/23 154 lb 12.8 oz (70.2 kg)  01/24/23 155 lb (70.3 kg)  01/16/23 154 lb 9.6 oz (70.1 kg)     ECOG FS:1 - Symptomatic but completely ambulatory  PE not done, telephone visit.  LAB RESULTS:  CMP     Component Value Date/Time   NA 140 05/01/2023 0921   K 4.4 05/01/2023 0921   CL 105 05/01/2023 0921   CO2 24 05/01/2023 0921   GLUCOSE 102 (H) 05/01/2023 0921   GLUCOSE 106 (H) 01/13/2023 1207   BUN 14 05/01/2023 0921   CREATININE 0.88 05/01/2023 0921   CREATININE 1.14 (H) 10/08/2018 1109   CREATININE 1.04 (H) 01/31/2017 0915   CALCIUM 9.4 05/01/2023 0921   PROT 7.0 05/01/2023 0921   ALBUMIN 4.1 05/01/2023 0921   AST 29 05/01/2023 0921   AST 22 10/08/2018 1109   ALT 28 05/01/2023 0921   ALT 22 10/08/2018 1109   ALKPHOS 74 05/01/2023 0921   BILITOT 0.8 05/01/2023 0921   BILITOT 0.9 10/08/2018 1109   GFRNONAA >60 01/13/2023 1207   GFRNONAA 51 (L) 10/08/2018 1109   GFRAA 68 09/04/2019 0827   GFRAA 59 (L) 10/08/2018 1109    Lab Results  Component Value Date   WBC 7.5 01/13/2023   NEUTROABS 3.7 08/23/2021   HGB 14.2 01/13/2023   HCT 41.8 01/13/2023   MCV 94.1 01/13/2023   PLT 149 (L) 01/13/2023   No results found for: "LABCA2"  No components found for: "UYQIHK742"  No results for input(s): "INR" in the last  168 hours.  No results found for: "LABCA2"  No results found for: "VZD638"  No results found for: "CAN125"  No results found for: "CAN153"  No results found for: "CA2729"  No components found for: "HGQUANT"  No results found for: "CEA1", "CEA" / No results found for: "CEA1", "CEA"   No results found for: "AFPTUMOR"  No results found for: "CHROMOGRNA"  No results found for: "TOTALPROTELP", "ALBUMINELP", "A1GS", "A2GS", "BETS", "BETA2SER", "GAMS", "MSPIKE", "SPEI" (this displays SPEP labs)  No results found for: "KPAFRELGTCHN", "LAMBDASER", "KAPLAMBRATIO" (kappa/lambda light chains)  No results found for: "HGBA", "HGBA2QUANT", "HGBFQUANT", "HGBSQUAN" (Hemoglobinopathy evaluation)   Lab Results  Component Value Date   LDH 155 07/03/2019    Lab Results  Component Value Date   IRON 75 07/03/2019   IRONPCTSAT 24.0 07/03/2019   (Iron and TIBC)  Lab Results  Component Value Date   FERRITIN 128.8 07/03/2019    Urinalysis    Component Value Date/Time   COLORURINE STRAW (A) 01/13/2023 1237   APPEARANCEUR CLEAR 01/13/2023 1237   LABSPEC 1.009 01/13/2023 1237   PHURINE 6.0 01/13/2023 1237   GLUCOSEU NEGATIVE 01/13/2023 1237   HGBUR NEGATIVE 01/13/2023 1237   BILIRUBINUR NEGATIVE 01/13/2023 1237   KETONESUR NEGATIVE 01/13/2023 1237   PROTEINUR NEGATIVE 01/13/2023 1237   NITRITE NEGATIVE 01/13/2023 1237   LEUKOCYTESUR NEGATIVE 01/13/2023 1237    STUDIES:  No results found.   ELIGIBLE FOR AVAILABLE RESEARCH PROTOCOL: no   ASSESSMENT: 67  y.o. PALB2 positive [place] woman status post left breast biopsy 08/19/2018 for ductal carcinoma in situ, grade 2, estrogen and progesterone receptor positive  (1) left lumpectomy 1999 for an (estrogen receptor negative?) stage I-II invasive breast cancer  (a) s/p chemotherapy  (b) s/p radiation  (2) genetics testing 10/14/2018 through the Common Hereditary Cancer Panel + EGFR was POSITIVE for a pathogenic variant in PALB2  [id number].[id number]>A ([id number]*).  (a) no additional mutations were noted in APC, ATM, AXIN2, BARD1, BMPR1A, BRCA1, BRCA2, BRIP1, BUB1B, CDH1, CDK4, CDKN2A, CHEK2, CTNNA1, DICER1, ENG, EGFR, EPCAM, GALNT12, GREM1, HOXB13, KIT, MEN1, MLH1, MLH3, MSH2, MSH3, MSH6, MUTYH, NBN, NF1, NTHL1, PALB2, PDGFRA, PMS2, POLD1, POLE, PTEN, RAD50, RAD51C, RAD51D, RNF43, RPS20, SDHA, SDHB, SDHC, SDHD, SMAD4, SMARCA4, STK11, TP53, TSC1, TSC2, VHL.  (3) s/p bilateral mastectomies 11/07/2018 showing  (a) on the right, no evidence of carcinoma  (b) on the left, no evidence of residual ductal carcinoma in situ  (4) ovarian cancer risk: BSO recommended given lack of family history information  (a) status post remote hysterectomy  (b) bilateral salpingo-oophorectomy 03/31/2019, with benign pathology.  (5) pancreatic cancer risk:  (a) MRI of the abdomen with and without contrast June 29, 2017 documented 0.8 cm cyst in the tail of the pancreas appearing to communicate with the main duct.    (b) MRCP 07/04/2018 shows the known cyst in the tail of the pancreas to measure 0.7 cm   (c) CT of the abdomen and pelvis November 2020 showed no evidence of ductal dilatation   PLAN:   Patient is here for follow-up for history of breast cancer and PAL B2 mutation.  She had bilateral mastectomy, no concern for recurrence on exam.  No role for mammograms.  She had bilateral salpingo-oophorectomy in 2020 with benign pathology.  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 With labs we have discussed that she was noted to have very mild thrombocytopenia on her labs back in May.  However since this is very borderline and all her previous lab counts showed normal platelets, we can continue to monitor this annually. I sincerely apologized for the delay caused by the lab and she was satisfied by it.  Total time spent: 20 min  *Total Encounter Time  as defined by the Centers for Medicare and Medicaid Services includes, in addition to the face-to-face time of a patient visit (documented in the note above) non-face-to-face time: obtaining and reviewing outside history, ordering and reviewing medications, tests or procedures, care coordination (communications with other health care professionals or caregivers) and documentation in the medical record.

## 2023-05-29 ENCOUNTER — Ambulatory Visit (INDEPENDENT_AMBULATORY_CARE_PROVIDER_SITE_OTHER): Payer: Medicare Other

## 2023-05-29 VITALS — BP 118/60 | HR 58 | Temp 97.1°F | Ht 64.0 in | Wt 154.8 lb

## 2023-05-29 DIAGNOSIS — Z0000 Encounter for general adult medical examination without abnormal findings: Secondary | ICD-10-CM | POA: Diagnosis not present

## 2023-05-29 DIAGNOSIS — Z23 Encounter for immunization: Secondary | ICD-10-CM | POA: Diagnosis not present

## 2023-05-29 NOTE — Patient Instructions (Addendum)
Brenda [name] , Thank you for taking time to come for your Medicare Wellness Visit. I appreciate your ongoing commitment to your health goals. Please review the following plan we discussed and let me know if I can assist you in the future.   Referrals/Orders/Follow-Ups/Clinician Recommendations: No  This is a list of the screening recommended for you and due dates:  Health Maintenance  Topic Date Due   Flu Shot  04/12/2023   COVID-19 Vaccine (10 - 2023-24 season) 05/13/2023   Medicare Annual Wellness Visit  05/28/2024   DTaP/Tdap/Td vaccine (4 - Td or Tdap) 11/19/2028   Colon Cancer Screening  08/17/2029   Pneumonia Vaccine  Completed   DEXA scan (bone density measurement)  Completed   Hepatitis C Screening  Completed   Zoster (Shingles) Vaccine  Completed   HPV Vaccine  Aged Out    Advanced directives: (In Chart) A copy of your advanced directives are scanned into your chart should your provider ever need it.  Next Medicare Annual Wellness Visit scheduled for next year: Yes

## 2023-05-29 NOTE — Progress Notes (Addendum)
Subjective:   Brenda [name] is a 67 y.o. female who presents for Medicare Annual (Subsequent) preventive examination.  Visit Complete: In person  ETOH Screening: completed 05/29/2023; no current ETOH use.  Cardiac Risk Factors include: advanced age (>36men, >42 women);dyslipidemia;hypertension     Objective:    Today's Vitals   05/29/23 0952  BP: 118/60  Pulse: (!) 58  Temp: (!) 97.1 F (36.2 C)  TempSrc: Temporal  SpO2: 99%  Weight: 154 lb 12.8 oz (70.2 kg)  Height: 5\' 4"  (1.626 m)  PainSc: 6   PainLoc: Knee   Body mass index is 26.57 kg/m.     05/29/2023   10:19 AM 05/26/2022    9:03 AM 11/03/2021   10:18 AM 05/20/2021    5:59 PM 08/08/2019    5:23 AM 03/13/2019    6:48 AM 02/21/2019    8:38 AM  Advanced Directives  Does Patient Have a Medical Advance Directive? Yes Yes Yes Yes No Yes Yes  Type of Estate agent of [place];Living will Healthcare Power of [place];Living will  Healthcare Power of [place];Living will;Out of facility DNR (pink MOST or yellow form)  Living will Healthcare Power of [place];Living will  Does patient want to make changes to medical advance directive? No - Patient declined No - Patient declined No - Patient declined No - Patient declined  No - Patient declined No - Patient declined  Copy of Healthcare Power of Attorney in Chart? Yes - validated most recent copy scanned in chart (See row information) Yes - validated most recent copy scanned in chart (See row information)  Yes - validated most recent copy scanned in chart (See row information)   Yes - validated most recent copy scanned in chart (See row information)  Would patient like information on creating a medical advance directive?     No - Patient declined      Current Medications (verified) Outpatient Encounter Medications as of 05/29/2023  Medication Sig   amLODipine (NORVASC) 10 MG tablet Take 1 tablet (10 mg total) by mouth daily.   aspirin EC 81 MG tablet Take 1  tablet (81 mg total) by mouth daily. Swallow whole.   carboxymethylcellulose (REFRESH PLUS) 0.5 % SOLN Apply to eye.   gabapentin (NEURONTIN) 300 MG capsule Take 1 capsule (300 mg total) by mouth 3 (three) times daily as needed (nerve pain).   metoprolol succinate (TOPROL-XL) 100 MG 24 hr tablet TAKE 1 TABLET BY MOUTH ONCE DAILY OR  IMMEDIATELY  FOLLOWING  A  MEAL (Patient taking differently: Take 100 mg by mouth daily.)   nitroGLYCERIN (NITROSTAT) 0.4 MG SL tablet Place 1 tablet (0.4 mg total) under the tongue every 5 (five) minutes as needed for chest pain. 1 tablet under tongue every 5 minutes as needed for chest discomfort (max 2 Tab/day)   Potassium Chloride ER 20 MEQ TBCR TAKE 1 TABLET BY MOUTH ON MONDAY, WEDNESDAY, FRIDAY AND EVERY SUNDAY   rosuvastatin (CRESTOR) 40 MG tablet Take 1 tablet by mouth once daily   Vitamin D, Ergocalciferol, 50000 units CAPS Take 1 capsule by mouth 4 (four) times a week.   ezetimibe (ZETIA) 10 MG tablet Take 1 tablet (10 mg total) by mouth daily.   FIBER ADULT GUMMIES PO Take 1 tablet by mouth daily. (Patient not taking: Reported on 05/29/2023)   traMADol (ULTRAM) 50 MG tablet Take 1 tablet (50 mg total) by mouth every 12 (twelve) hours as needed. (Patient not taking: Reported on 05/01/2023)   triamcinolone cream (  KENALOG) 0.1 % Apply 1 application. topically 2 (two) times daily. (Patient not taking: Reported on 05/01/2023)   No facility-administered encounter medications on file as of 05/29/2023.    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, Grade 2, Stage 0 (ER/PR +);  11-07-2018  s/p  bilateral mastectomies   Carotid artery stenosis     bilateral---  04-27-2000  s/p  left carotid endarterectomy:  last doppler in epic 08-13-2018  right ICA 40-59%,  right ECA >50%,  left ICA 1-39% ,  bilateral subclavians stenotic   CHF (congestive heart failure) (HCC)    Chronic stable angina    followed by cardiology   Complication of anesthesia    PONV   Coronary artery disease cardiologist-- dr Jens Som   06-12-2000  s/p  CABG x5  :  cardiac cath 02-21-2018 --  normal LVF, 80% LM, occluded RCA, 95% Ramus and 100% OM,  Patent seqSVG to  PLA-PDA & LIMA to LAD,  seqSVG to OM and RI occluded   Depression    Family history of lung cancer    History of blood transfusion    History of CVA with residual deficit    short term memory loss per pt   History of Helicobacter pylori infection 2001   Hyperlipidemia    Hypertension    Hyperthyroidism    dx yrs ago, per pt have taken medication for awhile   Mild mitral regurgitation    per echo 06/ 2019   PAD (peripheral artery disease) (HCC)    Peripheral vascular disease (HCC)    managed by vascular-- dr Randie Heinz   PONV (postoperative nausea and vomiting)    S/P CABG x 5 06-12-2000   by dr Zenaida Niece tright @[facility]    Severe obstructive sleep apnea-hypopnea syndrome 02/25/2021   Stroke (HCC) 2002   mild memory loss   Past Surgical History:  Procedure Laterality Date   ABDOMINAL AORTOGRAM W/LOWER EXTREMITY N/A 06/13/2017   Procedure: ABDOMINAL AORTOGRAM W/LOWER EXTREMITY;  Surgeon: Maeola Harman, MD;  [facility] CV LAB;  Service: Cardiovascular;  Laterality: N/A;  Bilateral   ABDOMINAL HYSTERECTOMY  1998   BREAST LUMPECTOMY WITH AXILLARY LYMPH NODE DISSECTION Left 1999   CARDIAC CATHETERIZATION  04-24-2000  dr Graciela Husbands   3V CAD , subtotal PDA w/ slow flow colleterals , ef 48%   CARDIAC CATHETERIZATION  06/11/2000   3V CAD with progression of LAD disease   CAROTID ENDARTERECTOMY Left 04-27-2000   dr Madilyn Fireman  @[facility]    COLONOSCOPY  06/24/2019   [facility]   CORONARY ARTERY BYPASS GRAFT  06-12-2000    dr Zenaida Niece tright @[facility]    x 5 (left internal mammary artery to left anterior  descending coronary artery   LEFT HEART CATH AND CORS/GRAFTS ANGIOGRAPHY N/A 02/21/2018   Procedure: LEFT HEART CATH AND CORS/GRAFTS ANGIOGRAPHY;  Surgeon: Runell Gess, MD;  Location: [facility] INVASIVE CV LAB;  Service: Cardiovascular;  Laterality: N/A;   PERIPHERAL VASCULAR INTERVENTION  06/13/2017   Procedure: PERIPHERAL VASCULAR INTERVENTION;  Surgeon: Maeola Harman, MD;  Location: [facility] INVASIVE CV LAB;  Service: Cardiovascular;;  Bilateral Iliac Stents   ROBOTIC  ASSISTED SALPINGO OOPHERECTOMY Bilateral 03/13/2019   Procedure: XI ROBOTIC ASSISTED BILATERAL  SALPINGO OOPHORECTOMY;  Surgeon: Adolphus Birchwood, MD;  Location: [facility];  Service: Gynecology;  Laterality: Bilateral;   TOTAL MASTECTOMY Bilateral 11/07/2018   Procedure: BILATERAL MASTECTOMIES;  Surgeon: Almond Lint, MD;  Location: [facility];  Service: General;  Laterality: Bilateral;   Family History  Problem Relation Age of Onset   Lung cancer Mother 29       died at an early age   Other Other        There is no Hx of premature coronary artery disease in her family   Colon cancer Neg Hx    Rectal cancer Neg Hx    Stomach cancer Neg Hx    Social History   Socioeconomic History   Marital status: Single    Spouse name: Not on file   Number of children: 1   Years of education: Not on file   Highest education level: Not on file  Occupational History   Occupation: Child psychotherapist, retired from [employer]    Employer: UNEMPLOYED  Tobacco Use   Smoking status: Former    Current packs/day: 0.00    Types: Cigarettes    Start date: 09/11/1984    Quit date: 09/11/2001    Years since quitting: 21.7   Smokeless tobacco: Never  Vaping Use   Vaping status: Never Used  Substance and Sexual Activity   Alcohol use: Not Currently    Comment: Quit 1998   Drug use: No   Sexual activity: Not on file    Comment: Hysterectomy  Other  Topics Concern   Not on file  Social History Narrative   Not on file   Social Determinants of Health   Financial Resource Strain: Low Risk  (05/29/2023)   Overall Financial Resource Strain (CARDIA)    Difficulty of Paying Living Expenses: Not hard at all  Food Insecurity: No Food Insecurity (05/29/2023)   Hunger Vital Sign    Worried About Running Out of Food in the Last Year: Never true    Ran Out of Food in the Last Year: Never true  Transportation Needs: No Transportation Needs (05/29/2023)   PRAPARE - Administrator, Civil Service (Medical): No    Lack of Transportation (Non-Medical): No  Physical Activity: Sufficiently Active (05/29/2023)   Exercise Vital Sign    Days of Exercise per Week: 3 days    Minutes of Exercise per Session: 60 min  Stress: No Stress Concern Present (05/29/2023)   [employer] of Occupational Health - Occupational Stress Questionnaire    Feeling of Stress : Not at all  Social Connections: Moderately Integrated (05/29/2023)   Social Connection and Isolation Panel [NHANES]    Frequency of Communication with Friends and Family: More than three times a week    Frequency of Social Gatherings with Friends and Family: More than three times a week    Attends Religious Services: More than 4 times per year    Active Member of [employer] or Organizations: Yes    Attends Engineer, structural: More than 4 times per year    Marital Status: Never married    Tobacco Counseling Counseling given: Not Answered   Clinical Intake:  Pre-visit preparation completed: Yes  Pain : No/denies pain Pain Score: 6      BMI - recorded: 26.57 Nutritional Status: BMI 25 -29 Overweight Nutritional Risks: None Diabetes: No  How often do you need to have  someone help you when you read instructions, pamphlets, or other written materials from your doctor or pharmacy?: 1 - Never What is the last grade level you completed in school?: Some College  Interpreter  Needed?: No  Information entered by :: Sahaj Bona N. Daiton Cowles, LPN.   Activities of Daily Living    05/29/2023    9:54 AM  In your present state of health, do you have any difficulty performing the following activities:  Hearing? 0  Vision? 0  Difficulty concentrating or making decisions? 0  Walking or climbing stairs? 0  Dressing or bathing? 0  Doing errands, shopping? 0  Preparing Food and eating ? N  Using the Toilet? N  In the past six months, have you accidently leaked urine? N  Do you have problems with loss of bowel control? N  Managing your Medications? N  Managing your Finances? N  Housekeeping or managing your Housekeeping? N    Patient Care Team: Myrlene Broker, MD as PCP - General (Internal Medicine) Jens Som Madolyn Frieze, MD as PCP - Cardiology (Cardiology) Adrian Prince, MD as Consulting Physician (Endocrinology) Almond Lint, MD as Consulting Physician (General Surgery) Glenna Fellows, MD as Consulting Physician (Plastic Surgery) Charna Elizabeth, MD as Consulting Physician (Gastroenterology) Adolphus Birchwood, MD as Consulting Physician (Gynecologic Oncology) Kathyrn Sheriff, [facility] (Inactive) as Pharmacist (Pharmacist) Dohmeier, Porfirio Mylar, MD as Consulting Physician (Neurology) Pershing Proud, RN as Oncology Nurse Navigator Donnelly Angelica, RN as Oncology Nurse Navigator Rachel Moulds, MD as Consulting Physician (Hematology and Oncology) Davina Poke as Consulting Physician (Optometry)  Indicate any recent Medical Services you may have received from other than [facility] providers in the past year (date may be approximate).     Assessment:   This is a routine wellness examination for [place].  Hearing/Vision screen Hearing Screening - Comments:: Denies hearing difficulties   Vision Screening - Comments:: Wears rx glasses - up to date with routine eye exams with London Sheer, OD.    Goals Addressed             This Visit's Progress    Client understands  the importance of follow-up with providers by attending scheduled visits        Depression Screen    05/29/2023    9:53 AM 01/24/2023    9:22 AM 10/02/2022   10:38 AM 10/02/2022   10:06 AM 05/26/2022    9:01 AM 05/20/2021    5:50 PM 04/08/2021    1:42 PM  PHQ 2/9 Scores  PHQ - 2 Score 0 0 0 0 0 0 0  PHQ- 9 Score 0 0 2        Fall Risk    05/29/2023   10:57 AM 01/24/2023    9:21 AM 10/02/2022   10:06 AM 05/26/2022    8:56 AM 05/20/2021    5:46 PM  Fall Risk   Falls in the past year? 0 0 0 0 0  Number falls in past yr: 0 0 0 0 0  Injury with Fall? 0 0 0 0 0  Risk for fall due to : No Fall Risks  No Fall Risks No Fall Risks   Follow up Falls prevention discussed Falls evaluation completed Falls evaluation completed Falls prevention discussed     MEDICARE RISK AT HOME: Medicare Risk at Home Any stairs in or around the home?: No If so, are there any without handrails?: No Home free of loose throw rugs in walkways, pet beds, electrical cords, etc?: Yes Adequate lighting  in your home to reduce risk of falls?: Yes Life alert?: No Use of a cane, walker or w/c?: No Grab bars in the bathroom?: No Shower chair or bench in shower?: No Elevated toilet seat or a handicapped toilet?: No  TIMED UP AND GO:  Was the test performed?  Yes  Length of time to ambulate 10 feet: 8 sec Gait steady and fast without use of assistive device    Cognitive Function:      01/31/2021    2:26 PM  Montreal Cognitive Assessment   Visuospatial/ Executive (0/5) 3  Naming (0/3) 3  Attention: Read list of digits (0/2) 1  Attention: Read list of letters (0/1) 1  Attention: Serial 7 subtraction starting at 100 (0/3) 2  Language: Repeat phrase (0/2) 2  Language : Fluency (0/1) 1  Abstraction (0/2) 2  Delayed Recall (0/5) 2  Orientation (0/6) 6  Total 23      05/29/2023    9:54 AM 05/26/2022    9:04 AM 05/20/2021    5:56 PM  6CIT Screen  What Year? 0 points 0 points 0 points  What month? 0 points 0  points 0 points  What time? 0 points 0 points 0 points  Count back from 20 0 points 0 points 0 points  Months in reverse 0 points 0 points 2 points  Repeat phrase 0 points 0 points 0 points  Total Score 0 points 0 points 2 points    Immunizations Immunization History  Administered Date(s) Administered   Fluad Quad(high Dose 65+) 05/23/2019, 05/29/2022   Fluad Trivalent(High Dose 65+) 05/29/2023   Influenza,inj,Quad PF,6+ Mos 08/20/2015, 06/21/2017, 06/08/2022   Influenza-Unspecified 06/11/2014, 07/13/2015, 05/29/2018   PFIZER Comirnaty(Gray Top)Covid-19 Tri-Sucrose Vaccine 10/16/2019, 11/06/2019, 06/28/2020, 02/22/2021   PFIZER(Purple Top)SARS-COV-2 Vaccination 10/16/2019, 11/06/2019, 06/28/2020, 06/13/2021, 06/29/2022   PNEUMOCOCCAL CONJUGATE-20 04/08/2021   Pneumococcal Polysaccharide-23 03/26/2019   Tdap 09/11/2008, 06/04/2018, 11/20/2018   Zoster Recombinant(Shingrix) 09/26/2021, 12/22/2021    TDAP status: Up to date  Flu Vaccine status: Completed at today's visit  Pneumococcal vaccine status: Up to date  Covid-19 vaccine status: Completed vaccines  Qualifies for Shingles Vaccine? Yes   Zostavax completed No   Shingrix Completed?: Yes  Screening Tests Health Maintenance  Topic Date Due   COVID-19 Vaccine (10 - 2023-24 season) 05/13/2023   Medicare Annual Wellness (AWV)  05/28/2024   DTaP/Tdap/Td (4 - Td or Tdap) 11/19/2028   Colonoscopy  08/17/2029   Pneumonia Vaccine 55+ Years old  Completed   INFLUENZA VACCINE  Completed   DEXA SCAN  Completed   Hepatitis C Screening  Completed   Zoster Vaccines- Shingrix  Completed   HPV VACCINES  Aged Out    Health Maintenance  Health Maintenance Due  Topic Date Due   COVID-19 Vaccine (10 - 2023-24 season) 05/13/2023    Colorectal cancer screening: Type of screening: Colonoscopy. Completed 08/18/2019. Repeat every 10 years  Mammogram status: No longer required due to history of breast cancer.  Bone Density status:  Completed 06/14/2022. Results reflect: Bone density results: NORMAL. Repeat every 3-5 years.  Lung Cancer Screening: (Low Dose CT Chest recommended if Age 2-80 years, 20 pack-year currently smoking OR have quit w/in 15years.) does not qualify.   Lung Cancer Screening Referral: no  Additional Screening:  Hepatitis C Screening: does qualify; Completed 05/21/2015  Vision Screening: Recommended annual ophthalmology exams for early detection of glaucoma and other disorders of the eye. Is the patient up to date with their annual eye exam?  Yes  Who is the provider or what is the name of the office in which the patient attends annual eye exams? London Sheer, OD. If pt is not established with a provider, would they like to be referred to a provider to establish care? No .   Dental Screening: Recommended annual dental exams for proper oral hygiene  Diabetic Foot Exam: N/A  Community Resource Referral / Chronic Care Management: CRR required this visit?  No   CCM required this visit?  No     Plan:     I have personally reviewed and noted the following in the patient's chart:   Medical and social history Use of alcohol, tobacco or illicit drugs  Current medications and supplements including opioid prescriptions. Patient is not currently taking opioid prescriptions. Functional ability and status Nutritional status Physical activity Advanced directives List of other physicians Hospitalizations, surgeries, and ER visits in previous 12 months Vitals Screenings to include cognitive, depression, and falls Referrals and appointments  In addition, I have reviewed and discussed with patient certain preventive protocols, quality metrics, and best practice recommendations. A written personalized care plan for preventive services as well as general preventive health recommendations were provided to patient.     Mickeal Needy, LPN   0/34/7425   After Visit Summary: Printed and given to  patient.  Nurse Notes: Normal cognitive status assessed by direct observation by this Nurse Health Advisor. No abnormalities found.

## 2023-06-13 ENCOUNTER — Ambulatory Visit: Payer: Medicare Other | Admitting: Internal Medicine

## 2023-06-16 ENCOUNTER — Other Ambulatory Visit: Payer: Self-pay | Admitting: Cardiology

## 2023-06-18 ENCOUNTER — Encounter: Payer: Self-pay | Admitting: Internal Medicine

## 2023-06-18 ENCOUNTER — Ambulatory Visit: Payer: Medicare Other | Admitting: Internal Medicine

## 2023-06-18 VITALS — BP 122/84 | HR 65 | Temp 98.2°F | Ht 64.0 in | Wt 153.0 lb

## 2023-06-18 DIAGNOSIS — Z8673 Personal history of transient ischemic attack (TIA), and cerebral infarction without residual deficits: Secondary | ICD-10-CM

## 2023-06-18 DIAGNOSIS — I69911 Memory deficit following unspecified cerebrovascular disease: Secondary | ICD-10-CM

## 2023-06-18 DIAGNOSIS — Z0001 Encounter for general adult medical examination with abnormal findings: Secondary | ICD-10-CM

## 2023-06-18 DIAGNOSIS — Z853 Personal history of malignant neoplasm of breast: Secondary | ICD-10-CM

## 2023-06-18 DIAGNOSIS — I10 Essential (primary) hypertension: Secondary | ICD-10-CM | POA: Diagnosis not present

## 2023-06-18 DIAGNOSIS — I739 Peripheral vascular disease, unspecified: Secondary | ICD-10-CM | POA: Diagnosis not present

## 2023-06-18 NOTE — Progress Notes (Unsigned)
Subjective:   Patient ID: Brenda [name], female    DOB: [birth year], 67 y.o.   MRN: [id number]  HPI The patient is here for physical.  PMH, [facility], social history reviewed and updated  Review of Systems  Objective:  Physical Exam  Vitals:   06/18/23 1044  BP: 122/84  Pulse: 65  Temp: 98.2 F (36.8 C)  TempSrc: Oral  SpO2: 96%  Weight: 153 lb (69.4 kg)  Height: 5\' 4"  (1.626 m)    Assessment & Plan:

## 2023-06-21 NOTE — Assessment & Plan Note (Signed)
Continue yearly mammogram.

## 2023-06-21 NOTE — Assessment & Plan Note (Signed)
Needs at least yearly hgA1c, lipid panel and CMP. BP at goal. Labs up to date no new stroke symptoms continue statin and aspirin.

## 2023-06-21 NOTE — Assessment & Plan Note (Signed)
With more pain in the leg ordered ABI to assess if there has been change in blood flow. Taking aspirin 81 mg daily and on statin.

## 2023-06-21 NOTE — Assessment & Plan Note (Signed)
Overall stable but need to write things down more often.

## 2023-06-21 NOTE — Assessment & Plan Note (Signed)
BP at goal on amlodipine 10 mg daily and metoprolol 100 mg daily. Continue same dosing.

## 2023-06-21 NOTE — Assessment & Plan Note (Signed)
Flu shot yearly. Pneumonia complete. Shingrix complete. Tetanus due 2030. Colonoscopy due 2030. Mammogram not candidate, pap smear aged out and dexa up to date. Counseled about sun safety and mole surveillance. Counseled about the dangers of distracted driving. Given 10 year screening recommendations.

## 2023-06-22 ENCOUNTER — Ambulatory Visit (HOSPITAL_COMMUNITY)
Admission: RE | Admit: 2023-06-22 | Discharge: 2023-06-22 | Disposition: A | Discharge status: Home / Self Care | Payer: Medicare Other | Source: Ambulatory Visit | Attending: Cardiology | Admitting: Cardiology

## 2023-06-22 DIAGNOSIS — I739 Peripheral vascular disease, unspecified: Secondary | ICD-10-CM | POA: Insufficient documentation

## 2023-06-25 LAB — VAS US ABI WITH/WO TBI
Left ABI: 1.47
Right ABI: 1.39

## 2023-06-26 ENCOUNTER — Telehealth: Payer: Self-pay | Admitting: Internal Medicine

## 2023-06-26 NOTE — Telephone Encounter (Signed)
Patient called and said she was returning a call from [facility] regarding an ultrasound she had done. Best callback is [phone].

## 2023-07-02 NOTE — Telephone Encounter (Signed)
Please call patient back

## 2023-07-03 NOTE — Telephone Encounter (Signed)
I have mailed pt her results closing this encounter

## 2023-08-02 ENCOUNTER — Other Ambulatory Visit: Payer: Self-pay | Admitting: *Deleted

## 2023-08-02 DIAGNOSIS — I6523 Occlusion and stenosis of bilateral carotid arteries: Secondary | ICD-10-CM

## 2023-08-14 ENCOUNTER — Other Ambulatory Visit: Payer: Self-pay | Admitting: Cardiology

## 2023-08-14 ENCOUNTER — Other Ambulatory Visit: Payer: Self-pay | Admitting: Internal Medicine

## 2023-08-14 DIAGNOSIS — E7800 Pure hypercholesterolemia, unspecified: Secondary | ICD-10-CM

## 2023-08-14 DIAGNOSIS — R0602 Shortness of breath: Secondary | ICD-10-CM

## 2023-08-14 DIAGNOSIS — Z79899 Other long term (current) drug therapy: Secondary | ICD-10-CM

## 2023-08-14 DIAGNOSIS — I10 Essential (primary) hypertension: Secondary | ICD-10-CM

## 2023-08-14 DIAGNOSIS — I2510 Atherosclerotic heart disease of native coronary artery without angina pectoris: Secondary | ICD-10-CM

## 2023-08-14 MED ORDER — POTASSIUM CHLORIDE ER 20 MEQ PO TBCR: EXTENDED_RELEASE_TABLET | ORAL | 0 refills | Status: DC

## 2023-08-29 ENCOUNTER — Ambulatory Visit: Payer: Medicare Other | Admitting: Orthopedic Surgery

## 2023-08-31 ENCOUNTER — Ambulatory Visit (HOSPITAL_COMMUNITY)
Admission: RE | Admit: 2023-08-31 | Discharge: 2023-08-31 | Disposition: A | Discharge status: Home / Self Care | Payer: Medicare Other | Source: Ambulatory Visit | Attending: Cardiology | Admitting: Cardiology

## 2023-08-31 DIAGNOSIS — I6523 Occlusion and stenosis of bilateral carotid arteries: Secondary | ICD-10-CM

## 2023-09-06 ENCOUNTER — Other Ambulatory Visit: Payer: Self-pay | Admitting: *Deleted

## 2023-09-06 ENCOUNTER — Encounter: Payer: Self-pay | Admitting: *Deleted

## 2023-09-06 DIAGNOSIS — I6523 Occlusion and stenosis of bilateral carotid arteries: Secondary | ICD-10-CM

## 2023-09-14 ENCOUNTER — Other Ambulatory Visit (INDEPENDENT_AMBULATORY_CARE_PROVIDER_SITE_OTHER): Payer: Medicare Other

## 2023-09-14 ENCOUNTER — Ambulatory Visit (INDEPENDENT_AMBULATORY_CARE_PROVIDER_SITE_OTHER): Payer: Medicare Other | Admitting: Surgical

## 2023-09-14 ENCOUNTER — Encounter: Payer: Self-pay | Admitting: Surgical

## 2023-09-14 DIAGNOSIS — M25562 Pain in left knee: Secondary | ICD-10-CM

## 2023-09-14 DIAGNOSIS — M25561 Pain in right knee: Secondary | ICD-10-CM | POA: Diagnosis not present

## 2023-09-14 NOTE — Progress Notes (Signed)
 Office Visit Note   Patient: Brenda [name]           Date of Birth: Mar 20, 1956           MRN: [id number] Visit Date: 09/14/2023 Requested by: Rollene Almarie LABOR, MD [address] [place],  [place] [address] PCP: Rollene Almarie LABOR, MD  Subjective: Chief Complaint  Patient presents with   Other    Bilateral knee pain    HPI: Brenda [name] is a 68 y.o. female who presents to the office reporting bilateral knee pain.  Patient states that she is very active and wants to remain active as long as possible.  She goes to the gym 4 days/week and walks 1 to 2 miles 5 days/week.  She does spin classes, yoga, body pump classes.  She has intermittent right and left knee pain that bother her about once a week or every other week to the point where she has to use a lidocaine  patch.  These patches do very well for her and typically take care of the majority of her symptoms.  She has no history of prior knee surgery.  She very rarely wakes up with any knee pain about less than 1 time per month.  Has noticed an occasional clicking sensation in the right knee but not the left.  No groin pain in either leg.  She does not feel her knee pain significantly limits her at this point..                ROS: All systems reviewed are negative as they relate to the chief complaint within the history of present illness.  Patient denies fevers or chills.  Assessment & Plan: Visit Diagnoses:  1. Pain in both knees, unspecified chronicity     Plan: Patient is a 68 year old female who presents for evaluation of bilateral knee pain.  She is a very active individual with fairly infrequent knee pain that bothers her about once a week or every other week.  Radiographs taken today demonstrate minimal degenerative changes of the medial compartment in either knee.  With her level of symptoms, we discussed other options available to patient such as topical medications in addition to the patches that she uses or trying a  oral anti-inflammatory to take by mouth.  She will hold off on anything for now but if symptoms worsen, we can prescribe an anti-inflammatory or try injections into the knees.  For now she will continue with her activity level and follow-up with the office as needed if symptoms worsen.  Follow-Up Instructions: No follow-ups on file.   Orders:  Orders Placed This Encounter  Procedures   XR KNEE 3 VIEW RIGHT   XR Knee 1-2 Views Left   No orders of the defined types were placed in this encounter.     Procedures: No procedures performed   Clinical Data: No additional findings.  Objective: Vital Signs: LMP  (LMP Unknown)   Physical Exam:  Constitutional: Patient appears well-developed HEENT:  Head: Normocephalic Eyes:EOM are normal Neck: Normal range of motion Cardiovascular: Normal rate Pulmonary/chest: Effort normal Neurologic: Patient is alert Skin: Skin is warm Psychiatric: Patient has normal mood and affect  Ortho Exam: Ortho exam demonstrates bilateral knees with 0 degrees extension and 125 degrees of knee flexion.  No tenderness over the medial or lateral joint lines of either knee.  Excellent hamstring and quadricep strength rated 5/5 bilaterally.  She does have mild tenderness over the pes anserine bursa bilaterally.  No pain with hip range of motion.  Negative anterior posterior drawer sign bilaterally.  No instability to varus or valgus stress at 0 and 30 degrees bilaterally.  No cellulitis or skin changes noted of either knee.  Specialty Comments:  EXAM: MRI LUMBAR SPINE WITHOUT CONTRAST   TECHNIQUE: Multiplanar, multisequence MR imaging of the lumbar spine was performed. No intravenous contrast was administered.   COMPARISON:  04/12/2020   FINDINGS: Segmentation:  5 lumbar type vertebrae   Alignment:  Mild degenerative anterolisthesis at L4-5   Vertebrae:  No fracture, evidence of discitis, or bone lesion.   Conus medullaris and cauda equina: Conus  extends to the L2 level. Conus and cauda equina appear normal.   Paraspinal and other soft tissues: Negative for perispinal inflammation or masslike finding. Advanced atrophy at the upper pole right kidney.   Disc levels:   T12- L1: Unremarkable.   L1-L2: Unremarkable.   L2-L3: Disc bulging.   L3-L4: Mild disc narrowing and bulging.   L4-L5: Degenerative facet spurring with mild anterolisthesis. Disc space narrowing and bulging. No neural compression   L5-S1:Disc space narrowing and bulging with endplate degeneration. Bilateral facet spurring. Right-sided nerve roots appear conjoined proximally. No nerve root compression.   IMPRESSION: Lumbar spine degeneration with mild L4-5 anterolisthesis. No neural compression or progression from 2021.     Electronically Signed   By: Dorn Roulette M.D.   On: 07/01/2022 09:51  Imaging: No results found.   PMFS History: Patient Active Problem List   Diagnosis Date Noted   Dizziness 01/24/2023   Optic neuritis, retrobulbar (acute), left 03/29/2021   Severe obstructive sleep apnea-hypopnea syndrome 02/25/2021   PAD (peripheral artery disease) (HCC) 01/31/2021   Memory deficit following unspecified cerebrovascular disease 01/31/2021   Excessive daytime sleepiness 01/31/2021   Cognitive deficit with impaired visuospatial function 01/31/2021   Pancreatic insufficiency 05/30/2019   Encounter for general adult medical examination with abnormal findings 05/30/2019   Monoallelic mutation of PALB2 gene 10/25/2018   Genetic testing 10/25/2018   HX: breast cancer 10/08/2018   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/08/2018   Hypothyroidism 03/02/2018   Lesion of pancreas 06/22/2017   Constipation 06/22/2017   Allergic rhinitis 02/25/2016   Low back pain 09/29/2015   Carpal tunnel syndrome, right 08/20/2015   Hx of completed stroke 05/21/2015   Hyperthyroidism 12/29/2008   HYPERCHOLESTEROLEMIA 12/29/2008   Anxiety  12/29/2008   Essential hypertension 12/29/2008   Coronary atherosclerosis 12/29/2008   CAROTID ARTERY STENOSIS 12/29/2008   Past Medical History:  Diagnosis Date   Alcoholism [facility])    Quit 1998   Allergy    Anemia    Anxiety    Breast cancer, left [facility]) oncologist-- dr layla   dx 1999,  DCIS;  s/p  left lumpectomy w/ node dissection's;  completed chemo and radiation 2000:  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, Grade 2, Stage 0 (ER/PR +);  11-07-2018  s/p  bilateral mastectomies   Carotid artery stenosis    bilateral---  04-27-2000  s/p  left carotid endarterectomy:  last doppler in epic 08-13-2018  right ICA 40-59%,  right ECA >50%,  left ICA 1-39% ,  bilateral subclavians stenotic   CHF (congestive heart failure) (HCC)    Chronic stable angina (HCC)    followed by cardiology   Complication of anesthesia    PONV   Coronary artery disease cardiologist-- dr pietro   06-12-2000  s/p  CABG x5  :  cardiac cath 02-21-2018 --  normal LVF, 80% LM, occluded RCA, 95% Ramus and 100% OM,  Patent seqSVG to  PLA-PDA & LIMA to LAD,  seqSVG to OM and RI occluded   Depression    Family history of lung cancer    History of blood transfusion    History of CVA with residual deficit    short term memory loss per pt   History of Helicobacter pylori infection 2001   Hyperlipidemia    Hypertension    Hyperthyroidism    dx yrs ago, per pt have taken medication for awhile   Mild mitral regurgitation    per echo 06/ 2019   PAD (peripheral artery disease) (HCC)    Peripheral vascular disease (HCC)    managed by vascular-- dr sheree   PONV (postoperative nausea and vomiting)    S/P CABG x 5 06-12-2000   by dr fleeta tright @[facility]    Severe obstructive sleep apnea-hypopnea syndrome 02/25/2021   Stroke (HCC) 2002   mild memory loss    Family History  Problem Relation Age of Onset   Lung cancer Mother 34       died at an early age   Other Other        There is no Hx of premature coronary artery  disease in her family   Colon cancer Neg Hx    Rectal cancer Neg Hx    Stomach cancer Neg Hx     Past Surgical History:  Procedure Laterality Date   ABDOMINAL AORTOGRAM W/LOWER EXTREMITY N/A 06/13/2017   Procedure: ABDOMINAL AORTOGRAM W/LOWER EXTREMITY;  Surgeon: Sheree Penne Bruckner, MD;  Location: [facility] INVASIVE CV LAB;  Service: Cardiovascular;  Laterality: N/A;  Bilateral   ABDOMINAL HYSTERECTOMY  1998   BREAST LUMPECTOMY WITH AXILLARY LYMPH NODE DISSECTION Left 1999   CARDIAC CATHETERIZATION  04-24-2000  dr fernande   3V CAD , subtotal PDA w/ slow flow colleterals , ef 48%   CARDIAC CATHETERIZATION  06/11/2000   3V CAD with progression of LAD disease   CAROTID ENDARTERECTOMY Left 04-27-2000   dr dyane  @[facility]    COLONOSCOPY  06/24/2019   [facility]   CORONARY ARTERY BYPASS GRAFT  06-12-2000   dr fleeta tright @[facility]    x 5 (left internal mammary artery to left anterior  descending coronary artery   LEFT HEART CATH AND CORS/GRAFTS ANGIOGRAPHY N/A 02/21/2018   Procedure: LEFT HEART CATH AND CORS/GRAFTS ANGIOGRAPHY;  Surgeon: Court Dorn PARAS, MD;  Location: [facility] INVASIVE CV LAB;  Service: Cardiovascular;  Laterality: N/A;   PERIPHERAL VASCULAR INTERVENTION  06/13/2017   Procedure: PERIPHERAL VASCULAR INTERVENTION;  Surgeon: Sheree Penne Bruckner, MD;  Location: [facility] INVASIVE CV LAB;  Service: Cardiovascular;;  Bilateral Iliac Stents   ROBOTIC ASSISTED SALPINGO OOPHERECTOMY Bilateral 03/13/2019   Procedure: XI ROBOTIC ASSISTED BILATERAL  SALPINGO OOPHORECTOMY;  Surgeon: Eloy Herring, MD;  Location: [facility];  Service: Gynecology;  Laterality: Bilateral;   TOTAL MASTECTOMY Bilateral 11/07/2018   Procedure: BILATERAL MASTECTOMIES;  Surgeon: Aron Shoulders, MD;  Location:  SURGERY CENTER;  Service: General;  Laterality: Bilateral;   Social History   Occupational History   Occupation: Child psychotherapist, retired from [employer]    Employer: UNEMPLOYED  Tobacco Use   Smoking status: Former     Current packs/day: 0.00    Types: Cigarettes    Start date: 09/11/1984    Quit date: 09/11/2001    Years since quitting: 22.0   Smokeless tobacco: Never  Vaping Use   Vaping status: Never Used  Substance  and Sexual Activity   Alcohol use: Not Currently    Comment: Quit 1998   Drug use: No   Sexual activity: Not on file    Comment: Hysterectomy

## 2023-10-07 ENCOUNTER — Encounter: Payer: Self-pay | Admitting: Emergency Medicine

## 2023-10-07 ENCOUNTER — Ambulatory Visit
Admission: EM | Admit: 2023-10-07 | Discharge: 2023-10-07 | Disposition: A | Discharge status: Home / Self Care | Payer: Medicare Other

## 2023-10-07 DIAGNOSIS — J111 Influenza due to unidentified influenza virus with other respiratory manifestations: Secondary | ICD-10-CM | POA: Diagnosis not present

## 2023-10-07 LAB — POCT INFLUENZA A/B
Influenza A, POC: POSITIVE — AB
Influenza B, POC: NEGATIVE

## 2023-10-07 MED ORDER — OSELTAMIVIR PHOSPHATE 75 MG PO CAPS: 75.0000 mg | ORAL_CAPSULE | Freq: Two times a day (BID) | ORAL | 0 refills | Status: DC

## 2023-10-07 MED ORDER — IBUPROFEN 600 MG PO TABS: 600.0000 mg | ORAL_TABLET | Freq: Four times a day (QID) | ORAL | 0 refills | Status: AC | PRN

## 2023-10-07 NOTE — ED Provider Notes (Signed)
[facility]    CSN: [id number] Arrival date & time: 10/07/23  8295      History   Chief Complaint Chief Complaint  Patient presents with   Chest Congestion   Cough   Headache    HPI Brenda [name] is a 68 y.o. female.   Patient here today for evaluation of cough congestion that started about 3 to 4 days ago.  She reports that she has tried over-the-counter medication without resolution.  She has tried using cough drops as well.  Patient reports some mild chest discomfort at times, more central that seems to be worse with coughing.  She denies any shortness of breath.  The history is provided by the patient.  Cough Associated symptoms: headaches and sore throat   Associated symptoms: no chills, no ear pain, no eye discharge, no fever, no shortness of breath and no wheezing   Headache Associated symptoms: congestion, cough, sinus pressure and sore throat   Associated symptoms: no abdominal pain, no diarrhea, no ear pain, no fever, no nausea and no vomiting     Past Medical History:  Diagnosis Date   Alcoholism (HCC)    Quit 1998   Allergy    Anemia    Anxiety    Breast cancer, left [facility]) oncologist-- dr Darnelle Catalan   dx 1999,  DCIS;  s/p  left lumpectomy w/ node dissection's;  completed chemo and radiation 2000:  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, Grade 2, Stage 0 (ER/PR +);  11-07-2018  s/p  bilateral mastectomies   Carotid artery stenosis    bilateral---  04-27-2000  s/p  left carotid endarterectomy:  last doppler in epic 08-13-2018  right ICA 40-59%,  right ECA >50%,  left ICA 1-39% ,  bilateral subclavians stenotic   CHF (congestive heart failure) (HCC)    Chronic stable angina (HCC)    followed by cardiology   Complication of anesthesia    PONV   Coronary artery disease cardiologist-- dr Jens Som   06-12-2000  s/p  CABG x5  :  cardiac cath 02-21-2018 --  normal LVF, 80% LM, occluded RCA, 95% Ramus and 100% OM,  Patent seqSVG to  PLA-PDA & LIMA  to LAD,  seqSVG to OM and RI occluded   Depression    Family history of lung cancer    History of blood transfusion    History of CVA with residual deficit    short term memory loss per pt   History of Helicobacter pylori infection 2001   Hyperlipidemia    Hypertension    Hyperthyroidism    dx yrs ago, per pt have taken medication for awhile   Mild mitral regurgitation    per echo 06/ 2019   PAD (peripheral artery disease) (HCC)    Peripheral vascular disease (HCC)    managed by vascular-- dr Randie Heinz   PONV (postoperative nausea and vomiting)    S/P CABG x 5 06-12-2000   by dr Zenaida Niece tright @[facility]    Severe obstructive sleep apnea-hypopnea syndrome 02/25/2021   Stroke (HCC) 2002   mild memory loss    Patient Active Problem List   Diagnosis Date Noted   Dizziness 01/24/2023   Optic neuritis, retrobulbar (acute), left 03/29/2021   Severe obstructive sleep apnea-hypopnea syndrome 02/25/2021   PAD (peripheral artery disease) (HCC) 01/31/2021   Memory deficit following unspecified cerebrovascular disease 01/31/2021   Excessive daytime sleepiness 01/31/2021   Cognitive deficit with impaired visuospatial function 01/31/2021   Pancreatic insufficiency 05/30/2019   Encounter  for general adult medical examination with abnormal findings 05/30/2019   Monoallelic mutation of PALB2 gene 10/25/2018   Genetic testing 10/25/2018   HX: breast cancer 10/08/2018   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/08/2018   Hypothyroidism 03/02/2018   Lesion of pancreas 06/22/2017   Constipation 06/22/2017   Allergic rhinitis 02/25/2016   Low back pain 09/29/2015   Carpal tunnel syndrome, right 08/20/2015   Hx of completed stroke 05/21/2015   Hyperthyroidism 12/29/2008   HYPERCHOLESTEROLEMIA 12/29/2008   Anxiety 12/29/2008   Essential hypertension 12/29/2008   Coronary atherosclerosis 12/29/2008   CAROTID ARTERY STENOSIS 12/29/2008    Past Surgical History:  Procedure Laterality Date    ABDOMINAL AORTOGRAM W/LOWER EXTREMITY N/A 06/13/2017   Procedure: ABDOMINAL AORTOGRAM W/LOWER EXTREMITY;  Surgeon: Maeola Harman, MD;  Location: [facility] INVASIVE CV LAB;  Service: Cardiovascular;  Laterality: N/A;  Bilateral   ABDOMINAL HYSTERECTOMY  1998   BREAST LUMPECTOMY WITH AXILLARY LYMPH NODE DISSECTION Left 1999   CARDIAC CATHETERIZATION  04-24-2000  dr Graciela Husbands   3V CAD , subtotal PDA w/ slow flow colleterals , ef 48%   CARDIAC CATHETERIZATION  06/11/2000   3V CAD with progression of LAD disease   CAROTID ENDARTERECTOMY Left 04-27-2000   dr Madilyn Fireman  @[facility]    COLONOSCOPY  06/24/2019   [facility]   CORONARY ARTERY BYPASS GRAFT  06-12-2000   dr Zenaida Niece tright @[facility]    x 5 (left internal mammary artery to left anterior  descending coronary artery   LEFT HEART CATH AND CORS/GRAFTS ANGIOGRAPHY N/A 02/21/2018   Procedure: LEFT HEART CATH AND CORS/GRAFTS ANGIOGRAPHY;  Surgeon: Runell Gess, MD;  Location: [facility] INVASIVE CV LAB;  Service: Cardiovascular;  Laterality: N/A;   PERIPHERAL VASCULAR INTERVENTION  06/13/2017   Procedure: PERIPHERAL VASCULAR INTERVENTION;  Surgeon: Maeola Harman, MD;  Location: [facility] INVASIVE CV LAB;  Service: Cardiovascular;;  Bilateral Iliac Stents   ROBOTIC ASSISTED SALPINGO OOPHERECTOMY Bilateral 03/13/2019   Procedure: XI ROBOTIC ASSISTED BILATERAL  SALPINGO OOPHORECTOMY;  Surgeon: Adolphus Birchwood, MD;  Location: [facility];  Service: Gynecology;  Laterality: Bilateral;   TOTAL MASTECTOMY Bilateral 11/07/2018   Procedure: BILATERAL MASTECTOMIES;  Surgeon: Almond Lint, MD;  Location: [facility];  Service: General;  Laterality: Bilateral;    OB History   No obstetric history on file.      Home Medications    Prior to Admission medications   Medication Sig Start Date End Date Taking? Authorizing Provider  amLODipine (NORVASC) 10 MG tablet Take 1 tablet by mouth once daily 08/16/23  Yes Crenshaw, Madolyn Frieze, MD  aspirin EC 81 MG tablet  Take 1 tablet (81 mg total) by mouth daily. Swallow whole. 10/13/21  Yes Lewayne Bunting, MD  ezetimibe (ZETIA) 10 MG tablet Take 1 tablet by mouth once daily 06/18/23  Yes Crenshaw, Madolyn Frieze, MD  ibuprofen (ADVIL) 600 MG tablet Take 1 tablet (600 mg total) by mouth every 6 (six) hours as needed. 10/07/23  Yes Tomi Bamberger, PA-C  oseltamivir (TAMIFLU) 75 MG capsule Take 1 capsule (75 mg total) by mouth every 12 (twelve) hours. 10/07/23  Yes Tomi Bamberger, PA-C  rosuvastatin (CRESTOR) 40 MG tablet Take 1 tablet by mouth once daily 06/23/22  Yes Crenshaw, Madolyn Frieze, MD  Vitamin D, Ergocalciferol, 50000 units CAPS Take 1 capsule by mouth 4 (four) times a week. 07/22/12  Yes [provider]  carboxymethylcellulose (REFRESH PLUS) 0.5 % SOLN Apply to eye.    [provider]  ezetimibe (ZETIA) 10 MG tablet Take 10 mg by mouth daily.    [provider]  FIBER ADULT GUMMIES PO Take 1 tablet by mouth daily. Patient not taking: Reported on 05/29/2023    [provider]  gabapentin (NEURONTIN) 300 MG capsule Take 1 capsule (300 mg total) by mouth 3 (three) times daily as needed (nerve pain). 04/14/22   Rodolph Bong, MD  metoprolol succinate (TOPROL-XL) 100 MG 24 hr tablet TAKE 1 TABLET BY MOUTH ONCE DAILY * OR IMMEDIATELY AFTER A MEAL * 08/16/23   Lewayne Bunting, MD  nitroGLYCERIN (NITROSTAT) 0.4 MG SL tablet Place 1 tablet (0.4 mg total) under the tongue every 5 (five) minutes as needed for chest pain. 1 tablet under tongue every 5 minutes as needed for chest discomfort (max 2 Tab/day) 05/01/23   Lewayne Bunting, MD  Potassium Chloride ER 20 MEQ TBCR TAKE 1 TABLET BY MOUTH ON MONDAY, WEDNESDAY, FRIDAY AND EVERY SUNDAY 08/14/23   Myrlene Broker, MD  triamcinolone cream (KENALOG) 0.1 % Apply 1 application. topically 2 (two) times daily. Patient not taking: Reported on 05/01/2023 12/08/21   Myrlene Broker, MD    Family History Family History  Problem Relation  Age of Onset   Lung cancer Mother 20       died at an early age   Other Other        There is no Hx of premature coronary artery disease in her family   Colon cancer Neg Hx    Rectal cancer Neg Hx    Stomach cancer Neg Hx     Social History Social History   Tobacco Use   Smoking status: Former    Current packs/day: 0.00    Types: Cigarettes    Start date: 09/11/1984    Quit date: 09/11/2001    Years since quitting: 22.0   Smokeless tobacco: Never  Vaping Use   Vaping status: Never Used  Substance Use Topics   Alcohol use: Not Currently    Comment: Quit 1998   Drug use: No     Allergies   Adhesive [tape] and Latex   Review of Systems Review of Systems  Constitutional:  Negative for chills and fever.  HENT:  Positive for congestion, sinus pressure and sore throat. Negative for ear pain.   Eyes:  Negative for discharge and redness.  Respiratory:  Positive for cough. Negative for shortness of breath and wheezing.   Gastrointestinal:  Negative for abdominal pain, diarrhea, nausea and vomiting.  Neurological:  Positive for headaches.     Physical Exam Triage Vital Signs ED Triage Vitals  Encounter Vitals Group     BP 10/07/23 1142 129/79     Systolic BP Percentile --      Diastolic BP Percentile --      Pulse Rate 10/07/23 1142 76     Resp 10/07/23 1142 20     Temp 10/07/23 1142 98.6 F (37 C)     Temp Source 10/07/23 1142 Oral     SpO2 10/07/23 1142 97 %     Weight 10/07/23 1136 153 lb (69.4 kg)     Height 10/07/23 1136 5\' 4"  (1.626 m)     Head Circumference --      Peak Flow --      Pain Score 10/07/23 1135 5     Pain Loc --      Pain Education --      Exclude from Growth Chart --  No data found.  Updated Vital Signs BP 129/79 (BP Location: Right Arm)   Pulse 76   Temp 98.6 F (37 C) (Oral)   Resp 20   Ht 5\' 4"  (1.626 m)   Wt 153 lb (69.4 kg)   LMP  (LMP Unknown)   SpO2 97%   BMI 26.26 kg/m   Visual Acuity Right Eye Distance:   Left Eye  Distance:   Bilateral Distance:    Right Eye Near:   Left Eye Near:    Bilateral Near:     Physical Exam Vitals and nursing note reviewed.  Constitutional:      General: She is not in acute distress.    Appearance: Normal appearance. She is not ill-appearing.  HENT:     Head: Normocephalic and atraumatic.     Right Ear: Tympanic membrane normal.     Left Ear: Tympanic membrane normal.     Nose: Congestion present.     Mouth/Throat:     Mouth: Mucous membranes are moist.     Pharynx: No oropharyngeal exudate or posterior oropharyngeal erythema.  Eyes:     Conjunctiva/sclera: Conjunctivae normal.  Cardiovascular:     Rate and Rhythm: Normal rate and regular rhythm.     Heart sounds: Normal heart sounds. No murmur heard. Pulmonary:     Effort: Pulmonary effort is normal. No respiratory distress.     Breath sounds: Normal breath sounds. No wheezing, rhonchi or rales.  Skin:    General: Skin is warm and dry.  Neurological:     Mental Status: She is alert.  Psychiatric:        Mood and Affect: Mood normal.        Thought Content: Thought content normal.      UC Treatments / Results  Labs (all labs ordered are listed, but only abnormal results are displayed) Labs Reviewed  POCT INFLUENZA A/B - Abnormal; Notable for the following components:      Result Value   Influenza A, POC Positive (*)    All other components within normal limits    EKG   Radiology No results found.  Procedures Procedures (including critical care time)  Medications Ordered in UC Medications - No data to display  Initial Impression / Assessment and Plan / UC Course  I have reviewed the triage vital signs and the nursing notes.  Pertinent labs & imaging results that were available during my care of the patient were reviewed by me and considered in my medical decision making (see chart for details).    Screening positive in office.  Given patient is still symptomatic we will treat with  Tamiflu and ibuprofen sent for pain relief.  Suspect chest discomfort reported is due to cough, possible costochondritis.  Lower suspicion for cardiac etiology but did recommend further evaluation in the emergency room with any worsening chest pain.  Encouraged follow-up if no gradual improvement or with any worsening symptoms.  Patient expressed understanding.  Final Clinical Impressions(s) / UC Diagnoses   Final diagnoses:  Influenza   Discharge Instructions   None    ED Prescriptions     Medication Sig Dispense Auth. Provider   oseltamivir (TAMIFLU) 75 MG capsule Take 1 capsule (75 mg total) by mouth every 12 (twelve) hours. 10 capsule Tomi Bamberger, PA-C   ibuprofen (ADVIL) 600 MG tablet Take 1 tablet (600 mg total) by mouth every 6 (six) hours as needed. 30 tablet Tomi Bamberger, PA-C      PDMP not  reviewed this encounter.   Tomi Bamberger, PA-C 10/07/23 [phone]

## 2023-10-07 NOTE — ED Triage Notes (Signed)
Pt presents with cough and chest congestion that started on 10/03/2023. She states she has used OTC products, cough drops, and hot tea but the cough and congestion is still lingering.

## 2023-10-08 ENCOUNTER — Telehealth: Payer: Self-pay

## 2023-10-08 NOTE — Telephone Encounter (Signed)
Copied from CRM [id number]. Topic: Clinical - Medication Question >> Oct 08, 2023  3:58 PM Kathryne Eriksson wrote: Reason for CRM: Tamiflu >> Oct 08, 2023  4:03 PM Kathryne Eriksson wrote: Patient states she was seen at the urgent care on yesterday 10/07/23 where she was diagnosed with the flu. Patient was prescribed TAMIFLU but is having an issue getting the medication because it was brought to her attention that no one has any in stock within a 75 mile radius. Patient is wanting to know if there's an alternative medication she can be prescribed that could help. Patient is requesting a call back at [phone]. Patient tried reaching back out to the urgent care center but has had no luck as she's not able to get anyone on the phone.

## 2023-10-09 NOTE — Telephone Encounter (Signed)
Per reported timeline on visit her symptoms started 3-4 days prior to visit so tamiflu and other flu treatments are not indicated. Typical timeline for recovery 7-10 days. Can use over the counter for symptoms.

## 2023-11-14 ENCOUNTER — Encounter: Payer: Self-pay | Admitting: Genetic Counselor

## 2023-11-26 ENCOUNTER — Other Ambulatory Visit: Payer: Self-pay | Admitting: Internal Medicine

## 2024-03-11 ENCOUNTER — Ambulatory Visit: Admitting: Internal Medicine

## 2024-03-17 ENCOUNTER — Other Ambulatory Visit: Payer: Self-pay | Admitting: Cardiology

## 2024-03-17 ENCOUNTER — Encounter: Payer: Self-pay | Admitting: Cardiology

## 2024-03-17 ENCOUNTER — Other Ambulatory Visit: Payer: Self-pay

## 2024-03-17 DIAGNOSIS — I2510 Atherosclerotic heart disease of native coronary artery without angina pectoris: Secondary | ICD-10-CM

## 2024-03-17 DIAGNOSIS — I10 Essential (primary) hypertension: Secondary | ICD-10-CM

## 2024-03-17 DIAGNOSIS — Z79899 Other long term (current) drug therapy: Secondary | ICD-10-CM

## 2024-03-17 DIAGNOSIS — E7800 Pure hypercholesterolemia, unspecified: Secondary | ICD-10-CM

## 2024-03-17 DIAGNOSIS — R0602 Shortness of breath: Secondary | ICD-10-CM

## 2024-03-17 MED ORDER — ROSUVASTATIN CALCIUM 40 MG PO TABS: 40.0000 mg | ORAL_TABLET | Freq: Every day | ORAL | 0 refills | Status: DC

## 2024-03-17 MED ORDER — METOPROLOL SUCCINATE ER 100 MG PO TB24: ORAL_TABLET | ORAL | 0 refills | Status: DC

## 2024-03-17 MED ORDER — AMLODIPINE BESYLATE 10 MG PO TABS: 10.0000 mg | ORAL_TABLET | Freq: Every day | ORAL | 0 refills | Status: DC

## 2024-03-17 NOTE — Telephone Encounter (Signed)
 RX sent to requested Pharmacy

## 2024-03-24 ENCOUNTER — Ambulatory Visit (INDEPENDENT_AMBULATORY_CARE_PROVIDER_SITE_OTHER): Admitting: Internal Medicine

## 2024-03-24 VITALS — BP 126/78 | HR 62 | Temp 97.9°F | Ht 64.0 in | Wt 152.0 lb

## 2024-03-24 DIAGNOSIS — Z8673 Personal history of transient ischemic attack (TIA), and cerebral infarction without residual deficits: Secondary | ICD-10-CM | POA: Diagnosis not present

## 2024-03-24 DIAGNOSIS — E039 Hypothyroidism, unspecified: Secondary | ICD-10-CM | POA: Diagnosis not present

## 2024-03-24 DIAGNOSIS — I10 Essential (primary) hypertension: Secondary | ICD-10-CM

## 2024-03-24 DIAGNOSIS — E7800 Pure hypercholesterolemia, unspecified: Secondary | ICD-10-CM | POA: Diagnosis not present

## 2024-03-24 DIAGNOSIS — I2510 Atherosclerotic heart disease of native coronary artery without angina pectoris: Secondary | ICD-10-CM

## 2024-03-24 DIAGNOSIS — I69911 Memory deficit following unspecified cerebrovascular disease: Secondary | ICD-10-CM

## 2024-03-24 LAB — CBC
HCT: 42.1 % (ref 36.0–46.0)
Hemoglobin: 14.2 g/dL (ref 12.0–15.0)
MCHC: 33.6 g/dL (ref 30.0–36.0)
MCV: 96.4 fl (ref 78.0–100.0)
Platelets: 148 K/uL — ABNORMAL LOW (ref 150.0–400.0)
RBC: 4.37 Mil/uL (ref 3.87–5.11)
RDW: 12.9 % (ref 11.5–15.5)
WBC: 7.4 K/uL (ref 4.0–10.5)

## 2024-03-24 LAB — COMPREHENSIVE METABOLIC PANEL WITH GFR
ALT: 38 U/L — ABNORMAL HIGH (ref 0–35)
AST: 34 U/L (ref 0–37)
Albumin: 4.3 g/dL (ref 3.5–5.2)
Alkaline Phosphatase: 75 U/L (ref 39–117)
BUN: 16 mg/dL (ref 6–23)
CO2: 29 meq/L (ref 19–32)
Calcium: 9.5 mg/dL (ref 8.4–10.5)
Chloride: 103 meq/L (ref 96–112)
Creatinine, Ser: 0.87 mg/dL (ref 0.40–1.20)
GFR: 67.61 mL/min (ref >60.00)
Glucose, Bld: 95 mg/dL (ref 70–99)
Potassium: 3.6 meq/L (ref 3.5–5.1)
Sodium: 140 meq/L (ref 135–145)
Total Bilirubin: 1 mg/dL (ref 0.2–1.2)
Total Protein: 7.7 g/dL (ref 6.0–8.3)

## 2024-03-24 LAB — LIPID PANEL
Cholesterol: 151 mg/dL (ref 0–200)
HDL: 56.7 mg/dL (ref >39.00)
LDL Cholesterol: 71 mg/dL (ref 0–99)
NonHDL: 94.63
Total CHOL/HDL Ratio: 3
Triglycerides: 117 mg/dL (ref 0.0–149.0)
VLDL: 23.4 mg/dL (ref 0.0–40.0)

## 2024-03-24 LAB — VITAMIN D 25 HYDROXY (VIT D DEFICIENCY, FRACTURES): VITD: 21.54 ng/mL — ABNORMAL LOW (ref 30.00–100.00)

## 2024-03-24 LAB — VITAMIN B12: Vitamin B-12: 712 pg/mL (ref 211–911)

## 2024-03-24 LAB — HEMOGLOBIN A1C: Hgb A1c MFr Bld: 6.1 % (ref 4.6–6.5)

## 2024-03-24 LAB — TSH: TSH: 1.39 u[IU]/mL (ref 0.35–5.50)

## 2024-03-24 NOTE — Progress Notes (Unsigned)
   Subjective:   Patient ID: Brenda [name], female    DOB: [birth year], 68 y.o.   MRN: [id number]  HPI The patient is a 68 YO female coming in for medical management and several concerns (see A/P for details).   Review of Systems  Constitutional: Negative.   HENT: Negative.    Eyes: Negative.   Respiratory:  Positive for chest tightness. Negative for cough and shortness of breath.   Cardiovascular:  Negative for chest pain, palpitations and leg swelling.  Gastrointestinal:  Negative for abdominal distention, abdominal pain, constipation, diarrhea, nausea and vomiting.  Musculoskeletal:  Positive for arthralgias and myalgias.  Skin: Negative.   Neurological: Negative.        Memory change  Psychiatric/Behavioral: Negative.      Objective:  Physical Exam Constitutional:      Appearance: She is well-developed.  HENT:     Head: Normocephalic and atraumatic.  Cardiovascular:     Rate and Rhythm: Normal rate and regular rhythm.  Pulmonary:     Effort: Pulmonary effort is normal. No respiratory distress.     Breath sounds: Normal breath sounds. No wheezing or rales.  Abdominal:     General: Bowel sounds are normal. There is no distension.     Palpations: Abdomen is soft.     Tenderness: There is no abdominal tenderness. There is no rebound.  Musculoskeletal:     Cervical back: Normal range of motion.  Skin:    General: Skin is warm and dry.  Neurological:     Mental Status: She is alert and oriented to person, place, and time.     Coordination: Coordination normal.     Vitals:   03/24/24 1516  BP: 126/78  Pulse: 62  Temp: 97.9 F (36.6 C)  TempSrc: Temporal  SpO2: 98%  Weight: 152 lb (68.9 kg)  Height: 5' 4 (1.626 m)    Assessment & Plan:

## 2024-03-25 ENCOUNTER — Encounter: Payer: Self-pay | Admitting: Internal Medicine

## 2024-03-25 NOTE — Assessment & Plan Note (Signed)
 BP at goal on mteoprolol and amlodipine . Checking CMP and adjust as needed.

## 2024-03-25 NOTE — Assessment & Plan Note (Signed)
 With some new chest tightness and counseled on etiology. Checking lipid panel she is not on statin.

## 2024-03-25 NOTE — Assessment & Plan Note (Signed)
Checking lipid panel and adjust zetia as needed.  °

## 2024-03-25 NOTE — Assessment & Plan Note (Signed)
 No new symptoms of stroke. She does have some concerns about memory which are stable. She is on zetia  and checking lipid panel adjust as needed. BP at goal and no known DM (checking HGA1c to rule out).

## 2024-03-25 NOTE — Assessment & Plan Note (Signed)
Checking TSH and adjust as needed not on medication currently.

## 2024-03-26 NOTE — Assessment & Plan Note (Signed)
 Overall this is stable and she is on statin. Continue.

## 2024-03-27 ENCOUNTER — Ambulatory Visit: Payer: Self-pay | Admitting: Internal Medicine

## 2024-05-29 ENCOUNTER — Ambulatory Visit (INDEPENDENT_AMBULATORY_CARE_PROVIDER_SITE_OTHER): Payer: Medicare Other

## 2024-05-29 VITALS — BP 150/80 | HR 56 | Ht 65.0 in | Wt 156.6 lb

## 2024-05-29 DIAGNOSIS — Z0000 Encounter for general adult medical examination without abnormal findings: Secondary | ICD-10-CM | POA: Diagnosis not present

## 2024-05-29 NOTE — Progress Notes (Addendum)
 Subjective:   Brenda [name] is a 68 y.o. who presents for a Medicare Wellness preventive visit.  As a reminder, Annual Wellness Visits don't include a physical exam, and some assessments may be limited, especially if this visit is performed virtually. We may recommend an in-person follow-up visit with your provider if needed.  Visit Complete: In person  Persons Participating in Visit: Patient.  AWV Questionnaire: No: Patient Medicare AWV questionnaire was not completed prior to this visit.  Cardiac Risk Factors include: advanced age (>53men, >54 women);hypertension;Other (see comment);dyslipidemia, Risk factor comments: PAD     Objective:    There were no vitals filed for this visit. There is no height or weight on file to calculate BMI.     05/29/2024    2:15 PM 05/29/2023   10:19 AM 05/26/2022    9:03 AM 11/03/2021   10:18 AM 05/20/2021    5:59 PM 08/08/2019    5:23 AM 03/13/2019    6:48 AM  Advanced Directives  Does Patient Have a Medical Advance Directive? Yes Yes Yes Yes Yes No Yes  Type of Estate agent of [place];Living will Healthcare Power of [place];Living will Healthcare Power of [place];Living will  Healthcare Power of [place];Living will;Out of facility DNR (pink MOST or yellow form)  Living will  Does patient want to make changes to medical advance directive? No - Patient declined No - Patient declined No - Patient declined No - Patient declined No - Patient declined  No - Patient declined   Copy of Healthcare Power of Attorney in Chart? Yes - validated most recent copy scanned in chart (See row information) Yes - validated most recent copy scanned in chart (See row information) Yes - validated most recent copy scanned in chart (See row information)  Yes - validated most recent copy scanned in chart (See row information)    Would patient like information on creating a medical advance directive?      No - Patient declined      Data saved with a  previous flowsheet row definition    Current Medications (verified) Outpatient Encounter Medications as of 05/29/2024  Medication Sig   amLODipine  (NORVASC ) 10 MG tablet Take 1 tablet (10 mg total) by mouth daily.   aspirin  EC 81 MG tablet Take 1 tablet (81 mg total) by mouth daily. Swallow whole.   carboxymethylcellulose (REFRESH PLUS) 0.5 % SOLN Apply to eye.   ezetimibe  (ZETIA ) 10 MG tablet Take 1 tablet by mouth once daily   ezetimibe  (ZETIA ) 10 MG tablet Take 10 mg by mouth daily.   gabapentin  (NEURONTIN ) 300 MG capsule Take 1 capsule (300 mg total) by mouth 3 (three) times daily as needed (nerve pain).   ibuprofen  (ADVIL ) 600 MG tablet Take 1 tablet (600 mg total) by mouth every 6 (six) hours as needed.   metoprolol  succinate (TOPROL -XL) 100 MG 24 hr tablet TAKE 1 TABLET BY MOUTH ONCE DAILY * OR IMMEDIATELY AFTER A MEAL *   nitroGLYCERIN  (NITROSTAT ) 0.4 MG SL tablet Place 1 tablet (0.4 mg total) under the tongue every 5 (five) minutes as needed for chest pain. 1 tablet under tongue every 5 minutes as needed for chest discomfort (max 2 Tab/day)   Potassium Chloride  ER 20 MEQ TBCR TAKE 1 TABLET BY MOUTH ON MONDAY, WEDNESDAY AND FRIDAY AND  EVERY  SUNDAY   rosuvastatin  (CRESTOR ) 40 MG tablet Take 1 tablet (40 mg total) by mouth daily.   Vitamin D , Ergocalciferol , 50000 units CAPS Take 1  capsule by mouth 4 (four) times a week. (Patient not taking: Reported on 05/29/2024)   No facility-administered encounter medications on file as of 05/29/2024.    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, Grade 2, Stage 0 (ER/PR +);  11-07-2018  s/p  bilateral mastectomies   Carotid  artery stenosis    bilateral---  04-27-2000  s/p  left carotid endarterectomy:  last doppler in epic 08-13-2018  right ICA 40-59%,  right ECA >50%,  left ICA 1-39% ,  bilateral subclavians stenotic   CHF (congestive heart failure) (HCC)    Chronic stable angina (HCC)    followed by cardiology   Complication of anesthesia    PONV   Coronary artery disease cardiologist-- dr pietro   06-12-2000  s/p  CABG x5  :  cardiac cath 02-21-2018 --  normal LVF, 80% LM, occluded RCA, 95% Ramus and 100% OM,  Patent seqSVG to  PLA-PDA & LIMA to LAD,  seqSVG to OM and RI occluded   Depression    Family history of lung cancer    History of blood transfusion    History of CVA with residual deficit    short term memory loss per pt   History of Helicobacter pylori infection 2001   Hyperlipidemia    Hypertension    Hyperthyroidism    dx yrs ago, per pt have taken medication for awhile   Mild mitral regurgitation    per echo 06/ 2019   PAD (peripheral artery disease) (HCC)    Peripheral vascular disease (HCC)    managed by vascular-- dr sheree   PONV (postoperative nausea and vomiting)    S/P CABG x 5 06-12-2000   by dr fleeta tright @[facility]    Severe obstructive sleep apnea-hypopnea syndrome 02/25/2021   Stroke (HCC) 2002   mild memory loss   Past Surgical History:  Procedure Laterality Date   ABDOMINAL AORTOGRAM W/LOWER EXTREMITY N/A 06/13/2017   Procedure: ABDOMINAL AORTOGRAM W/LOWER EXTREMITY;  Surgeon: sheree Penne Bruckner, MD;  Location: [facility] INVASIVE CV LAB;  Service: Cardiovascular;  Laterality: N/A;  Bilateral   ABDOMINAL HYSTERECTOMY  1998   BREAST LUMPECTOMY WITH AXILLARY LYMPH NODE DISSECTION Left 1999   CARDIAC CATHETERIZATION  04-24-2000  dr fernande   3V CAD , subtotal PDA w/ slow flow colleterals , ef 48%   CARDIAC CATHETERIZATION  06/11/2000   3V CAD with progression of LAD disease   CAROTID ENDARTERECTOMY Left 04-27-2000   dr dyane  @[facility]    COLONOSCOPY  06/24/2019   Danis   CORONARY ARTERY  BYPASS GRAFT  06-12-2000   dr fleeta tright @[facility]    x 5 (left internal mammary artery to left anterior  descending coronary artery   LEFT HEART CATH AND CORS/GRAFTS ANGIOGRAPHY N/A 02/21/2018   Procedure: LEFT HEART CATH AND CORS/GRAFTS ANGIOGRAPHY;  Surgeon: Court Dorn PARAS, MD;  Location: [facility] INVASIVE CV LAB;  Service: Cardiovascular;  Laterality: N/A;   PERIPHERAL VASCULAR INTERVENTION  06/13/2017   Procedure: PERIPHERAL VASCULAR INTERVENTION;  Surgeon: sheree Penne Bruckner, MD;  Location: [facility] INVASIVE CV LAB;  Service: Cardiovascular;;  Bilateral Iliac Stents   ROBOTIC ASSISTED SALPINGO  OOPHERECTOMY Bilateral 03/13/2019   Procedure: XI ROBOTIC ASSISTED BILATERAL  SALPINGO OOPHORECTOMY;  Surgeon: Eloy Herring, MD;  Location: [facility];  Service: Gynecology;  Laterality: Bilateral;   TOTAL MASTECTOMY Bilateral 11/07/2018   Procedure: BILATERAL MASTECTOMIES;  Surgeon: Aron Shoulders, MD;  Location: [facility];  Service: General;  Laterality: Bilateral;   Family History  Problem Relation Age of Onset   Lung cancer Mother 40       died at an early age   Other Other        There is no Hx of premature coronary artery disease in her family   Colon cancer Neg Hx    Rectal cancer Neg Hx    Stomach cancer Neg Hx    Social History   Socioeconomic History   Marital status: Single    Spouse name: Not on file   Number of children: 1   Years of education: Not on file   Highest education level: Not on file  Occupational History   Occupation: Child psychotherapist, 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   Occupation: Part time work/[employer]  Tobacco Use   Smoking status: Former    Current packs/day: 0.00    Types: Cigarettes    Start date: 09/11/1984    Quit date: 09/11/2001    Years since quitting: 22.7   Smokeless tobacco: Never  Vaping Use   Vaping status: Never Used  Substance and Sexual Activity   Alcohol use: Not Currently    Comment: Quit 1998   Drug use: No    Sexual activity: Not on file    Comment: Hysterectomy  Other Topics Concern   Not on file  Social History Narrative   Lives alone/2025   Social Drivers of Health   Financial Resource Strain: Medium Risk (05/29/2024)   Overall Financial Resource Strain (CARDIA)    Difficulty of Paying Living Expenses: Somewhat hard  Food Insecurity: No Food Insecurity (05/29/2024)   Hunger Vital Sign    Worried About Running Out of Food in the Last Year: Never true    Ran Out of Food in the Last Year: Never true  Transportation Needs: No Transportation Needs (05/29/2023)   PRAPARE - Administrator, Civil Service (Medical): No    Lack of Transportation (Non-Medical): No  Physical Activity: Sufficiently Active (05/29/2024)   Exercise Vital Sign    Days of Exercise per Week: 7 days    Minutes of Exercise per Session: 30 min  Stress: No Stress Concern Present (05/29/2024)   [employer] of Occupational Health - Occupational Stress Questionnaire    Feeling of Stress: Not at all  Social Connections: Moderately Integrated (05/29/2024)   Social Connection and Isolation Panel    Frequency of Communication with Friends and Family: More than three times a week    Frequency of Social Gatherings with Friends and Family: More than three times a week    Attends Religious Services: More than 4 times per year    Active Member of Clubs or Organizations: Yes    Attends Banker Meetings: More than 4 times per year    Marital Status: Never married    Tobacco Counseling Counseling given: Not Answered    Clinical Intake:  Pre-visit preparation completed: Yes  Pain : No/denies pain     Nutritional Risks: None  Lab Results  Component Value Date   HGBA1C 6.1 03/24/2024   HGBA1C 5.8 (H) 01/31/2021   HGBA1C 6.4 05/28/2019  How often do you need to have someone help you when you read instructions, pamphlets, or other written materials from your doctor or pharmacy?: 1 -  Never  Interpreter Needed?: No  Information entered by :: Latrelle Bazar, RMA   Activities of Daily Living     05/29/2024    2:14 PM  In your present state of health, do you have any difficulty performing the following activities:  Hearing? 0  Vision? 0  Difficulty concentrating or making decisions? 0  Walking or climbing stairs? 0  Dressing or bathing? 0  Doing errands, shopping? 0  Preparing Food and eating ? N  Using the Toilet? N  In the past six months, have you accidently leaked urine? N  Do you have problems with loss of bowel control? N  Managing your Medications? N  Managing your Finances? N  Housekeeping or managing your Housekeeping? N    Patient Care Team: Rollene Almarie LABOR, MD as PCP - General (Internal Medicine) Pietro Redell RAMAN, MD as PCP - Cardiology (Cardiology) Nichole Senior, MD as Consulting Physician (Endocrinology) Aron Shoulders, MD as Consulting Physician (General Surgery) Arelia Filippo, MD as Consulting Physician (Plastic Surgery) Kristie Lamprey, MD as Consulting Physician (Gastroenterology) Eloy Herring, MD as Consulting Physician (Gynecologic Oncology) Fate Morna SAILOR, [facility] (Inactive) as Pharmacist (Pharmacist) Dohmeier, Dedra, MD as Consulting Physician (Neurology) Tyree Nanetta SAILOR, RN as Oncology Nurse Navigator Loretha Ash, MD as Consulting Physician (Hematology and Oncology) Abigail Maude POUR as Consulting Physician (Optometry)  I have updated your Care Teams any recent Medical Services you may have received from other providers in the past year.     Assessment:   This is a routine wellness examination for Breeanna.  Hearing/Vision screen Hearing Screening - Comments:: Denies hearing difficulties   Vision Screening - Comments:: Wears eyeglasses/Dr. Abigail   Goals Addressed   None    Depression Screen     05/29/2024    2:52 PM 03/24/2024    3:21 PM 05/29/2023    9:53 AM 01/24/2023    9:22 AM 10/02/2022   10:38 AM 10/02/2022    10:06 AM 05/26/2022    9:01 AM  PHQ 2/9 Scores  PHQ - 2 Score 0 0 0 0 0 0 0  PHQ- 9 Score 0  0 0 2      Fall Risk     05/29/2024    2:44 PM 03/24/2024    3:21 PM 06/18/2023   10:47 AM 05/29/2023   10:57 AM 01/24/2023    9:21 AM  Fall Risk   Falls in the past year? 0 0 0 0 0  Number falls in past yr: 0 0 0 0 0  Injury with Fall? 0 0 0 0 0  Risk for fall due to :  No Fall Risks  No Fall Risks   Follow up Falls evaluation completed;Falls prevention discussed Falls evaluation completed Falls evaluation completed Falls prevention discussed Falls evaluation completed    MEDICARE RISK AT HOME:  Medicare Risk at Home Any stairs in or around the home?: Yes (stairs outside) If so, are there any without handrails?: Yes Home free of loose throw rugs in walkways, pet beds, electrical cords, etc?: Yes Adequate lighting in your home to reduce risk of falls?: Yes Life alert?: No Use of a cane, walker or w/c?: No Grab bars in the bathroom?: No Shower chair or bench in shower?: No Elevated toilet seat or a handicapped toilet?: No  TIMED UP AND GO:  Was the test performed?  Yes  Length of time to ambulate 10 feet: 15 sec Gait steady and fast without use of assistive device  Cognitive Function: Declined/Normal: No cognitive concerns noted by patient or family. Patient alert, oriented, able to answer questions appropriately and recall recent events. No signs of memory loss or confusion.      01/31/2021    2:26 PM  Montreal Cognitive Assessment   Visuospatial/ Executive (0/5) 3  Naming (0/3) 3  Attention: Read list of digits (0/2) 1  Attention: Read list of letters (0/1) 1  Attention: Serial 7 subtraction starting at 100 (0/3) 2  Language: Repeat phrase (0/2) 2  Language : Fluency (0/1) 1  Abstraction (0/2) 2  Delayed Recall (0/5) 2  Orientation (0/6) 6  Total 23      05/29/2023    9:54 AM 05/26/2022    9:04 AM 05/20/2021    5:56 PM  6CIT Screen  What Year? 0 points 0 points 0 points   What month? 0 points 0 points 0 points  What time? 0 points 0 points 0 points  Count back from 20 0 points 0 points 0 points  Months in reverse 0 points 0 points 2 points  Repeat phrase 0 points 0 points 0 points  Total Score 0 points 0 points 2 points    Immunizations Immunization History  Administered Date(s) Administered   Fluad Quad(high Dose 65+) 05/23/2019, 05/29/2022   Fluad Trivalent(High Dose 65+) 05/29/2023   Influenza,inj,Quad PF,6+ Mos 08/20/2015, 06/21/2017, 06/08/2022   Influenza-Unspecified 06/11/2014, 07/13/2015, 05/29/2018   PFIZER Comirnaty(Gray Top)Covid-19 Tri-Sucrose Vaccine 10/16/2019, 11/06/2019, 06/28/2020, 02/22/2021   PFIZER(Purple Top)SARS-COV-2 Vaccination 10/16/2019, 11/06/2019, 06/28/2020, 06/13/2021, 06/29/2022   PNEUMOCOCCAL CONJUGATE-20 04/08/2021   Pneumococcal Polysaccharide-23 03/26/2019   Tdap 09/11/2008, 06/04/2018, 11/20/2018   Zoster Recombinant(Shingrix) 09/26/2021, 12/22/2021    Screening Tests Health Maintenance  Topic Date Due   Influenza Vaccine  04/11/2024   COVID-19 Vaccine (10 - 2025-26 season) 05/12/2024   Medicare Annual Wellness (AWV)  05/29/2025   DTaP/Tdap/Td (4 - Td or Tdap) 11/19/2028   Colonoscopy  08/17/2029   Pneumococcal Vaccine: 50+ Years  Completed   DEXA SCAN  Completed   Hepatitis C Screening  Completed   Zoster Vaccines- Shingrix  Completed   HPV VACCINES  Aged Out   Meningococcal B Vaccine  Aged Out   Mammogram  Discontinued    Health Maintenance Items Addressed: See Nurse Notes at the end of this note  Additional Screening:  Vision Screening: Recommended annual ophthalmology exams for early detection of glaucoma and other disorders of the eye. Is the patient up to date with their annual eye exam?  No  Who is the provider or what is the name of the office in which the patient attends annual eye exams? Dr. Ricarda  Dental Screening: Recommended annual dental exams for proper oral hygiene  Community  Resource Referral / Chronic Care Management: CRR required this visit?  No   CCM required this visit?  No   Plan:    I have personally reviewed and noted the following in the patient's chart:   Medical and social history Use of alcohol, tobacco or illicit drugs  Current medications and supplements including opioid prescriptions. Patient is not currently taking opioid prescriptions. Functional ability and status Nutritional status Physical activity Advanced directives List of other physicians Hospitalizations, surgeries, and ER visits in previous 12 months Vitals Screenings to include cognitive, depression, and falls Referrals and appointments  In addition, I have reviewed and discussed with patient certain preventive  protocols, quality metrics, and best practice recommendations. A written personalized care plan for preventive services as well as general preventive health recommendations were provided to patient.   Rosemae Mcquown L Vishal Sandlin, CMA   05/29/2024   After Visit Summary: (MyChart) Due to this being a telephonic visit, the after visit summary with patients personalized plan was offered to patient via MyChart   Notes: Patient is due for a Flu vaccine and a Covid vaccine.  She will get both done at the pharmacy.  She had no other concerns to address today.  Medical screening examination/treatment/procedure(s) were performed by non-physician practitioner and as supervising physician I was immediately available for consultation/collaboration.  I agree with above. Karlynn Noel, MD

## 2024-05-29 NOTE — Patient Instructions (Addendum)
 [name],  Thank you for taking the time for your Medicare Wellness Visit. I appreciate your continued commitment to your health goals. Please review the care plan we discussed, and feel free to reach out if I can assist you further.  Medicare recommends these wellness visits once per year to help you and your care team stay ahead of potential health issues. These visits are designed to focus on prevention, allowing your provider to concentrate on managing your acute and chronic conditions during your regular appointments.  Please note that Annual Wellness Visits do not include a physical exam. Some assessments may be limited, especially if the visit was conducted virtually. If needed, we may recommend a separate in-person follow-up with your provider.  Ongoing Care Seeing your primary care provider every 3 to 6 months helps us  monitor your health and provide consistent, personalized care. Last office visit on 03/25/2024.  You are due for Flu and Covid vaccine and can get that done at your local pharmacy.  Keep up the good work.  Referrals If a referral was made during today's visit and you haven't received any updates within two weeks, please contact the referred provider directly to check on the status.  Recommended Screenings:  Health Maintenance  Topic Date Due   Flu Shot  04/11/2024   COVID-19 Vaccine (10 - 2025-26 season) 05/12/2024   Medicare Annual Wellness Visit  05/29/2025   DTaP/Tdap/Td vaccine (4 - Td or Tdap) 11/19/2028   Colon Cancer Screening  08/17/2029   Pneumococcal Vaccine for age over 59  Completed   DEXA scan (bone density measurement)  Completed   Hepatitis C Screening  Completed   Zoster (Shingles) Vaccine  Completed   HPV Vaccine  Aged Out   Meningitis B Vaccine  Aged Out   Breast Cancer Screening  Discontinued       05/29/2024    2:15 PM  Advanced Directives  Does Patient Have a Medical Advance Directive? Yes  Type of Estate agent of  [place];Living will  Does patient want to make changes to medical advance directive? No - Patient declined  Copy of Healthcare Power of Attorney in Chart? Yes - validated most recent copy scanned in chart (See row information)   Advance Care Planning is important because it: Ensures you receive medical care that aligns with your values, goals, and preferences. Provides guidance to your family and loved ones, reducing the emotional burden of decision-making during critical moments.  Vision: Annual vision screenings are recommended for early detection of glaucoma, cataracts, and diabetic retinopathy. These exams can also reveal signs of chronic conditions such as diabetes and high blood pressure.  Dental: Annual dental screenings help detect early signs of oral cancer, gum disease, and other conditions linked to overall health, including heart disease and diabetes.  Please see the attached documents for additional preventive care recommendations.

## 2024-06-30 ENCOUNTER — Telehealth: Payer: Self-pay

## 2024-06-30 DIAGNOSIS — K869 Disease of pancreas, unspecified: Secondary | ICD-10-CM

## 2024-06-30 NOTE — Telephone Encounter (Signed)
 Left message on machine to call back

## 2024-06-30 NOTE — Telephone Encounter (Signed)
 The pt has been advised and MRI ordered entered and sent to the schedulers

## 2024-06-30 NOTE — Telephone Encounter (Signed)
-----   Message from Nurse Linn B sent at 06/30/2024 10:11 AM EDT ----- Regarding: FW: MRI due Dr. Legrand patient ----- Message ----- From: Mercer Cristino SAILOR, RN Sent: 06/30/2024  12:00 AM EDT To: Cristino SAILOR Monte, RN Subject: MRI due                                        MR ABDOMEN W WO CONTRAST - pancreatic protocol  Need to enter order

## 2024-07-04 NOTE — Telephone Encounter (Signed)
 Patient requesting f/u call in regards to previous note. Please advise.

## 2024-07-04 NOTE — Telephone Encounter (Signed)
 The pt has been set up for MRI on 11/3

## 2024-07-09 ENCOUNTER — Other Ambulatory Visit: Payer: Self-pay | Admitting: Cardiology

## 2024-07-09 DIAGNOSIS — I2510 Atherosclerotic heart disease of native coronary artery without angina pectoris: Secondary | ICD-10-CM

## 2024-07-09 DIAGNOSIS — R0602 Shortness of breath: Secondary | ICD-10-CM

## 2024-07-09 DIAGNOSIS — I10 Essential (primary) hypertension: Secondary | ICD-10-CM

## 2024-07-09 DIAGNOSIS — E7800 Pure hypercholesterolemia, unspecified: Secondary | ICD-10-CM

## 2024-07-10 ENCOUNTER — Other Ambulatory Visit: Payer: Self-pay | Admitting: Cardiology

## 2024-07-10 DIAGNOSIS — Z79899 Other long term (current) drug therapy: Secondary | ICD-10-CM

## 2024-07-10 MED ORDER — AMLODIPINE BESYLATE 10 MG PO TABS: 10.0000 mg | ORAL_TABLET | Freq: Every day | ORAL | 0 refills | Status: DC

## 2024-07-14 ENCOUNTER — Encounter: Payer: Self-pay | Admitting: Radiology

## 2024-07-14 ENCOUNTER — Ambulatory Visit (HOSPITAL_COMMUNITY)
Admission: RE | Admit: 2024-07-14 | Discharge: 2024-07-14 | Disposition: A | Discharge status: Home / Self Care | Source: Ambulatory Visit | Attending: Gastroenterology | Admitting: Gastroenterology

## 2024-07-14 ENCOUNTER — Other Ambulatory Visit: Payer: Self-pay | Admitting: Gastroenterology

## 2024-07-14 DIAGNOSIS — K869 Disease of pancreas, unspecified: Secondary | ICD-10-CM | POA: Diagnosis present

## 2024-07-14 MED ORDER — GADOBUTROL 1 MMOL/ML IV SOLN
7.0000 mL | Freq: Once | INTRAVENOUS | Status: AC | PRN
  Administered 2024-07-14: 7 mL via INTRAVENOUS

## 2024-07-15 ENCOUNTER — Ambulatory Visit: Payer: Self-pay | Admitting: Gastroenterology

## 2024-08-04 ENCOUNTER — Other Ambulatory Visit: Payer: Self-pay | Admitting: Cardiology

## 2024-08-04 DIAGNOSIS — E7800 Pure hypercholesterolemia, unspecified: Secondary | ICD-10-CM

## 2024-08-05 ENCOUNTER — Ambulatory Visit

## 2024-08-05 ENCOUNTER — Ambulatory Visit (INDEPENDENT_AMBULATORY_CARE_PROVIDER_SITE_OTHER)

## 2024-08-05 DIAGNOSIS — M2041 Other hammer toe(s) (acquired), right foot: Secondary | ICD-10-CM | POA: Diagnosis not present

## 2024-08-05 DIAGNOSIS — M2042 Other hammer toe(s) (acquired), left foot: Secondary | ICD-10-CM

## 2024-08-05 DIAGNOSIS — M2012 Hallux valgus (acquired), left foot: Secondary | ICD-10-CM

## 2024-08-05 DIAGNOSIS — M2011 Hallux valgus (acquired), right foot: Secondary | ICD-10-CM | POA: Diagnosis not present

## 2024-08-05 DIAGNOSIS — L989 Disorder of the skin and subcutaneous tissue, unspecified: Secondary | ICD-10-CM

## 2024-08-06 MED ORDER — ROSUVASTATIN CALCIUM 40 MG PO TABS: 40.0000 mg | ORAL_TABLET | Freq: Every day | ORAL | 0 refills | Status: DC

## 2024-08-06 NOTE — Progress Notes (Signed)
 Subjective:  Patient ID: Brenda [name], female    DOB: 16-Nov-1955,  MRN: [id number]  No chief complaint on file.   Discussed the use of AI scribe software for clinical note transcription with the patient, who gave verbal consent to proceed.  History of Present Illness Brenda [name] is a 68 year old female who presents with foot pain.  She has progressive foot pain centered on hammer toe deformity of the left second toe, with an enlarging skin lesion under the left ball of her foot and a new skin lesion developing on the plantar aspect of the right ball of her foot. She recently had significant nocturnal pain in both great toes at the nail area, which is currently controlled with over-the-counter Bengay medicated strips. She notes rubbing of the second toe against her shoe and is concerned about developing wounds. She also notes dry feet but manages this with hygiene and a pumice stone.  She has breast cancer, congestive heart failure, and stents in her groin for blood flow problems, which may affect circulation to her feet and healing after any procedures. She is scheduled for a new ABI next month.      Review of Systems: Negative except as noted in the HPI. Denies N/V/F/Ch.  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, Grade 2, Stage 0 (ER/PR +);  11-07-2018  s/p  bilateral mastectomies   Carotid artery stenosis    bilateral---  04-27-2000  s/p  left carotid endarterectomy:  last doppler in epic 08-13-2018  right ICA 40-59%,  right ECA >50%,  left ICA 1-39% ,  bilateral subclavians stenotic   CHF (congestive heart failure) (HCC)    Chronic stable angina    followed by cardiology   Complication of anesthesia    PONV   Coronary  artery disease cardiologist-- dr pietro   06-12-2000  s/p  CABG x5  :  cardiac cath 02-21-2018 --  normal LVF, 80% LM, occluded RCA, 95% Ramus and 100% OM,  Patent seqSVG to  PLA-PDA & LIMA to LAD,  seqSVG to OM and RI occluded   Depression    Family history of lung cancer    History of blood transfusion    History of CVA with residual deficit    short term memory loss per pt   History of Helicobacter pylori infection 2001   Hyperlipidemia    Hypertension    Hyperthyroidism    dx yrs ago, per pt have taken medication for awhile   Mild mitral regurgitation    per echo 06/ 2019   PAD (peripheral artery disease)    Peripheral vascular disease    managed by vascular-- dr sheree   PONV (postoperative nausea and vomiting)    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 Swallow whole.,  Disp: 90 tablet, Rfl: 3   carboxymethylcellulose (REFRESH PLUS) 0.5 % SOLN, Apply to eye., Disp: , Rfl:    ezetimibe  (ZETIA ) 10 MG tablet, Take 10 mg by mouth daily., Disp: , Rfl:    ezetimibe  (ZETIA ) 10 MG tablet, Take 1 tablet by mouth once daily, Disp: 30 tablet, Rfl: 0   gabapentin  (NEURONTIN ) 300 MG capsule, Take 1 capsule (300 mg total) by mouth 3 (three) times daily as needed (nerve pain)., Disp: 90 capsule, Rfl: 3   ibuprofen  (ADVIL ) 600 MG tablet, Take 1 tablet (600 mg total) by mouth every 6 (six) hours as needed., Disp: 30 tablet, Rfl: 0   metoprolol  succinate (TOPROL -XL) 100 MG 24 hr tablet, TAKE 1 TABLET BY MOUTH ONCE DAILY OR  IMMEDIATELY  AFTER  A  MEAL, Disp: 30 tablet, Rfl: 0   nitroGLYCERIN  (NITROSTAT ) 0.4 MG SL tablet, Place 1 tablet (0.4 mg total) under the tongue every 5 (five)  minutes as needed for chest pain. 1 tablet under tongue every 5 minutes as needed for chest discomfort (max 2 Tab/day), Disp: 25 tablet, Rfl: 4   Potassium Chloride  ER 20 MEQ TBCR, TAKE 1 TABLET BY MOUTH ON MONDAY, WEDNESDAY AND FRIDAY AND  EVERY  SUNDAY, Disp: 45 tablet, Rfl: 0   rosuvastatin  (CRESTOR ) 40 MG tablet, Take 1 tablet by mouth once daily, Disp: 30 tablet, Rfl: 0   Vitamin D , Ergocalciferol , 50000 units CAPS, Take 1 capsule by mouth 4 (four) times a week. (Patient not taking: Reported on 05/29/2024), Disp: , Rfl:   Social History   Tobacco Use  Smoking Status Former   Current packs/day: 0.00   Types: Cigarettes   Start date: 09/11/1984   Quit date: 09/11/2001   Years since quitting: 22.9  Smokeless Tobacco Never    Allergies  Allergen Reactions   Adhesive [Tape] Itching   Latex Rash   Objective:   Constitutional Well developed. Well nourished. Oriented to person, place, and time.  Vascular Dorsalis pedis pulses palpable bilaterally. Posterior tibial pulses palpable bilaterally. Capillary refill normal to all digits.  No cyanosis or clubbing noted. Pedal hair growth normal.  Neurologic Normal speech. Epicritic sensation to light touch grossly present bilaterally. Negative tinel sign at tarsal tunnel bilaterally.   Dermatologic Skin texture and turgor are within normal limits.  No open wounds. Hyperkeratotic skin lesion with central core present plantar to left 5th metatarsal head and right 4th metatarsal head. Pain to palpation.  Mild pain to palpation dorsal b/l hallux nails  Musculoskeletal: Normal joint ROM without pain or crepitus bilaterally. Rectus hindfoot.  Hallux abductovalgus deformity present Left 1st MPJ diminished range of motion. Left 1st TMT without gross hypermobility. Right 1st MPJ diminished range of motion  Right 1st TMT without gross hypermobility. Lesser digital contractures present - at left second digit with flexible hammertoe deformity. MTP  dorsal contracture.     Radiographs: Taken and reviewed. Right Hallux abductovalgus deformity present. Metatarsal parabola normal. 1st/2nd IMA: 15.2; TSP: 5. No acute osseous findings such as fracture or dislocation. Rectus foot shape. Minimal joint space narrowing 1st MTP.       Assessment:   1. Hammertoe of left foot   2. Hallux abducto valgus, bilateral   3. Benign skin lesion      Plan:  Patient was evaluated and treated and all questions answered.  Assessment and Plan Assessment & Plan Left foot hammer toe deformity with dorsal contracture of the second toe Significant hammer toe deformity with dorsal contracture at the metatarsal phalangeal  joint.  - Provided pad to protect second toe from shoe rubbing. - Consider surgical intervention to decrease chance of wound developing on dorsal toe. Would likely require osteotomy to 1st metatarsal to create room for second digit to lay flat.  - Patient is interested in pursuing surgical intervention.  She does have a history of peripheral arterial disease with stenting.  She is going for a new ABI next month.  She is going to schedule an appointment with me after this ABI is completed.  If blood flow is appropriate for healing, we will consider this intervention.  B/l foot poro keratosis Poro keratosis on b/l feet due to plugged sweat gland causing pain, exacerbated by pressure. - Debrided poro keratosis to alleviate pain without incident - Provided pad to decrease pressure. - Consider custom orthotics if pad is effective.  Bilateral hallux nail pain - Monitor for infection signs such as increased redness or pus. - Consider nail removal if symptoms worsen, she is not interested in this today  Dry skin of feet Dry skin on feet, risk of thickened skin growth if unmanaged. - Use pumice stone after showering to remove dead skin. - Apply lotion with urea to maintain moisture.  Pre-clinical XR of left foot at next visit RTC after ABI  completed   Prentice Ovens, DPM AACFAS Fellowship Trained Podiatric Surgeon [facility]

## 2024-08-14 ENCOUNTER — Telehealth: Payer: Self-pay

## 2024-08-14 ENCOUNTER — Telehealth: Payer: Self-pay | Admitting: Cardiology

## 2024-08-14 DIAGNOSIS — I10 Essential (primary) hypertension: Secondary | ICD-10-CM

## 2024-08-14 DIAGNOSIS — I2510 Atherosclerotic heart disease of native coronary artery without angina pectoris: Secondary | ICD-10-CM

## 2024-08-14 DIAGNOSIS — E7800 Pure hypercholesterolemia, unspecified: Secondary | ICD-10-CM

## 2024-08-14 DIAGNOSIS — E782 Mixed hyperlipidemia: Secondary | ICD-10-CM

## 2024-08-14 DIAGNOSIS — R0602 Shortness of breath: Secondary | ICD-10-CM

## 2024-08-14 DIAGNOSIS — Z79899 Other long term (current) drug therapy: Secondary | ICD-10-CM

## 2024-08-14 MED ORDER — ROSUVASTATIN CALCIUM 40 MG PO TABS: 40.0000 mg | ORAL_TABLET | Freq: Every day | ORAL | 0 refills | Status: AC

## 2024-08-14 MED ORDER — METOPROLOL SUCCINATE ER 100 MG PO TB24: ORAL_TABLET | ORAL | 0 refills | Status: AC

## 2024-08-14 MED ORDER — AMLODIPINE BESYLATE 10 MG PO TABS: 10.0000 mg | ORAL_TABLET | Freq: Every day | ORAL | 0 refills | Status: AC

## 2024-08-14 MED ORDER — EZETIMIBE 10 MG PO TABS: 10.0000 mg | ORAL_TABLET | Freq: Every day | ORAL | 0 refills | Status: AC

## 2024-08-14 NOTE — Telephone Encounter (Signed)
 Spoke with pt and was notified that she was running out of the following medications: (5)-Amlodipine , (2)-Metoprolol , (1)-Ezetimibe , and (2)-Rosuvastatin . Pt stated that she missed call from pharmacy and was unsure of why she was receiving the call.   Called Pharmacy to confirm if she had any refills on her meds and was told #15 was sent in on rosuvastatin  which pt stated that she was almost out of. There were no refills on other medications.  Called cardiology office (Dr. Pietro) and spoke with [facility] and explained the situation. Per Rojelio stated that pt needed f/u appt which was scheduled on today for 10/22/24. Rojelio stated that she would send message to refill team with medications that the pt needed and request that they refill to carry pt until she is seen in office. Confirmed rx refills to be sent to [facility] on [address].   Called pt and made aware.

## 2024-08-14 NOTE — Progress Notes (Signed)
 Pharmacy Quality Measure Review  This patient is appearing on a report for being at risk of failing the adherence measure for cholesterol (statin) medications this calendar year.   Medication: Rosuvastatin  40mg  Last fill date: 10/30 for 30 day supply  Refill sent on 11/24 for 30 day supply by Dr. Pietro, cardiologist, which states that patient due for appointment before more refills can be sent. Spoke with patient, she will call to schedule a follow-up with her cardiologist.   Noted that she did not get a phone call reminder, and expects/is used to this. Is almost out of other medications written by Dr. Pietro, which do not have refills for this same reason. Will send note to Dr. Pietro so the he is aware and can adjust patient expectations of this if needed.  [name], PharmD [facility] Pharmacist

## 2024-08-14 NOTE — Telephone Encounter (Signed)
*  STAT* If patient is at the pharmacy, call can be transferred to refill team.   1. Which medications need to be refilled? (please list name of each medication and dose if known) amLODipine  (NORVASC ) 10 MG tablet   ezetimibe  (ZETIA ) 10 MG tablet    metoprolol  succinate (TOPROL -XL) 100 MG 24 hr tablet    rosuvastatin  (CRESTOR ) 40 MG tablet   2. Which pharmacy/location (including street and city if local pharmacy) is medication to be sent to? [facility] [address] [place]), [place] - [id number] Phone: [phone]  Fax: [phone]     3. Do they need a 30 day or 90 day supply? Pt made appt for 10/22/24 please refill til then

## 2024-08-14 NOTE — Telephone Encounter (Signed)
 Copied from CRM #[id number]. Topic: General - Call Back - No Documentation >> Aug 14, 2024  9:48 AM Rea ORN wrote: Reason for CRM: Pt missed a call from the pharmacist. Please call back, [phone]

## 2024-08-14 NOTE — Telephone Encounter (Signed)
 Pt scheduled to see Dr. Pietro 10/22/24.  Sent in 68 day supply of medications requested, enough until appointment.

## 2024-08-15 ENCOUNTER — Ambulatory Visit: Admitting: Cardiology

## 2024-09-01 ENCOUNTER — Ambulatory Visit (HOSPITAL_COMMUNITY)
Admission: RE | Admit: 2024-09-01 | Discharge: 2024-09-01 | Disposition: A | Discharge status: Home / Self Care | Source: Ambulatory Visit | Attending: Cardiology | Admitting: Cardiology

## 2024-09-01 DIAGNOSIS — I6523 Occlusion and stenosis of bilateral carotid arteries: Secondary | ICD-10-CM | POA: Insufficient documentation

## 2024-09-12 ENCOUNTER — Encounter: Payer: Self-pay | Admitting: *Deleted

## 2024-09-15 DIAGNOSIS — I6523 Occlusion and stenosis of bilateral carotid arteries: Secondary | ICD-10-CM

## 2024-09-18 ENCOUNTER — Encounter: Payer: Self-pay | Admitting: *Deleted

## 2024-10-22 ENCOUNTER — Ambulatory Visit: Admitting: Cardiology
